# Patient Record
Sex: Female | Born: 1965
Health system: Southern US, Community
[De-identification: ages and names within clinical notes are randomized; demographics above are authoritative.]

## PROBLEM LIST (undated history)

## (undated) ENCOUNTER — Emergency Department (HOSPITAL_COMMUNITY): Admission: EM | Payer: Self-pay | Source: Home / Self Care

## (undated) ENCOUNTER — Emergency Department (HOSPITAL_COMMUNITY): Admission: EM | Discharge status: Against Medical Advice | Payer: Self-pay

## (undated) DIAGNOSIS — E785 Hyperlipidemia, unspecified: Secondary | ICD-10-CM

## (undated) DIAGNOSIS — E119 Type 2 diabetes mellitus without complications: Secondary | ICD-10-CM

## (undated) DIAGNOSIS — I5189 Other ill-defined heart diseases: Secondary | ICD-10-CM

## (undated) DIAGNOSIS — R002 Palpitations: Secondary | ICD-10-CM

## (undated) DIAGNOSIS — G8929 Other chronic pain: Secondary | ICD-10-CM

## (undated) DIAGNOSIS — R32 Unspecified urinary incontinence: Secondary | ICD-10-CM

## (undated) DIAGNOSIS — G9341 Metabolic encephalopathy: Secondary | ICD-10-CM

## (undated) DIAGNOSIS — I10 Essential (primary) hypertension: Secondary | ICD-10-CM

## (undated) DIAGNOSIS — R011 Cardiac murmur, unspecified: Secondary | ICD-10-CM

## (undated) DIAGNOSIS — E1140 Type 2 diabetes mellitus with diabetic neuropathy, unspecified: Secondary | ICD-10-CM

## (undated) DIAGNOSIS — C73 Malignant neoplasm of thyroid gland: Secondary | ICD-10-CM

## (undated) DIAGNOSIS — R079 Chest pain, unspecified: Secondary | ICD-10-CM

## (undated) DIAGNOSIS — J4489 Other specified chronic obstructive pulmonary disease: Secondary | ICD-10-CM

## (undated) DIAGNOSIS — M1990 Unspecified osteoarthritis, unspecified site: Secondary | ICD-10-CM

## (undated) DIAGNOSIS — J449 Chronic obstructive pulmonary disease, unspecified: Secondary | ICD-10-CM

## (undated) DIAGNOSIS — K219 Gastro-esophageal reflux disease without esophagitis: Secondary | ICD-10-CM

## (undated) DIAGNOSIS — J45909 Unspecified asthma, uncomplicated: Secondary | ICD-10-CM

## (undated) DIAGNOSIS — D649 Anemia, unspecified: Secondary | ICD-10-CM

## (undated) DIAGNOSIS — F32A Depression, unspecified: Secondary | ICD-10-CM

## (undated) DIAGNOSIS — R0600 Dyspnea, unspecified: Secondary | ICD-10-CM

## (undated) DIAGNOSIS — K5792 Diverticulitis of intestine, part unspecified, without perforation or abscess without bleeding: Secondary | ICD-10-CM

## (undated) DIAGNOSIS — K635 Polyp of colon: Secondary | ICD-10-CM

## (undated) DIAGNOSIS — D1722 Benign lipomatous neoplasm of skin and subcutaneous tissue of left arm: Secondary | ICD-10-CM

## (undated) DIAGNOSIS — F329 Major depressive disorder, single episode, unspecified: Secondary | ICD-10-CM

## (undated) HISTORY — DX: Diverticulitis of intestine, part unspecified, without perforation or abscess without bleeding: K57.92

## (undated) HISTORY — PX: ABDOMINAL HYSTERECTOMY: SHX81

## (undated) HISTORY — DX: Other ill-defined heart diseases: I51.89

## (undated) HISTORY — DX: Chest pain, unspecified: R07.9

## (undated) HISTORY — PX: TUBAL LIGATION: SHX77

## (undated) HISTORY — DX: Other chronic pain: G89.29

## (undated) HISTORY — DX: Palpitations: R00.2

## (undated) HISTORY — DX: Other specified chronic obstructive pulmonary disease: J44.89

## (undated) HISTORY — DX: Major depressive disorder, single episode, unspecified: F32.9

## (undated) HISTORY — DX: Depression, unspecified: F32.A

## (undated) HISTORY — DX: Chronic obstructive pulmonary disease, unspecified: J44.9

---

## 2004-11-07 ENCOUNTER — Emergency Department (HOSPITAL_COMMUNITY): Admission: EM | Admit: 2004-11-07 | Discharge: 2004-11-07 | Payer: Self-pay

## 2004-11-11 ENCOUNTER — Emergency Department (HOSPITAL_COMMUNITY): Admission: EM | Admit: 2004-11-11 | Discharge: 2004-11-11 | Payer: Self-pay | Admitting: Family Medicine

## 2004-11-13 ENCOUNTER — Emergency Department (HOSPITAL_COMMUNITY): Admission: EM | Admit: 2004-11-13 | Discharge: 2004-11-13 | Payer: Self-pay | Admitting: Family Medicine

## 2006-09-18 ENCOUNTER — Emergency Department (HOSPITAL_COMMUNITY): Admission: EM | Admit: 2006-09-18 | Discharge: 2006-09-18 | Payer: Self-pay | Admitting: Emergency Medicine

## 2010-07-11 ENCOUNTER — Emergency Department (HOSPITAL_COMMUNITY): Admission: EM | Admit: 2010-07-11 | Discharge: 2010-07-11 | Payer: Self-pay | Admitting: Emergency Medicine

## 2010-09-25 ENCOUNTER — Emergency Department (HOSPITAL_COMMUNITY): Admission: EM | Admit: 2010-09-25 | Discharge: 2010-09-25 | Payer: Self-pay | Admitting: Emergency Medicine

## 2010-11-06 ENCOUNTER — Emergency Department (HOSPITAL_COMMUNITY): Admission: EM | Admit: 2010-11-06 | Discharge: 2010-11-06 | Payer: Self-pay | Admitting: Emergency Medicine

## 2011-03-06 LAB — URINALYSIS, ROUTINE W REFLEX MICROSCOPIC
Bilirubin Urine: NEGATIVE
Glucose, UA: NEGATIVE mg/dL
Hgb urine dipstick: NEGATIVE
Ketones, ur: NEGATIVE mg/dL
Nitrite: NEGATIVE
Protein, ur: NEGATIVE mg/dL
Specific Gravity, Urine: 1.028 (ref 1.005–1.030)
Urobilinogen, UA: 1 mg/dL (ref 0.0–1.0)
pH: 6.5 (ref 5.0–8.0)

## 2011-07-29 ENCOUNTER — Emergency Department (HOSPITAL_COMMUNITY): Payer: Self-pay

## 2011-07-29 ENCOUNTER — Emergency Department (HOSPITAL_COMMUNITY)
Admission: EM | Admit: 2011-07-29 | Discharge: 2011-07-30 | Disposition: A | Discharge status: Home / Self Care | Payer: Self-pay | Attending: Emergency Medicine | Admitting: Emergency Medicine

## 2011-07-29 DIAGNOSIS — M25579 Pain in unspecified ankle and joints of unspecified foot: Secondary | ICD-10-CM | POA: Insufficient documentation

## 2011-07-29 DIAGNOSIS — W010XXA Fall on same level from slipping, tripping and stumbling without subsequent striking against object, initial encounter: Secondary | ICD-10-CM | POA: Insufficient documentation

## 2011-07-29 DIAGNOSIS — S93409A Sprain of unspecified ligament of unspecified ankle, initial encounter: Secondary | ICD-10-CM | POA: Insufficient documentation

## 2011-07-29 DIAGNOSIS — I10 Essential (primary) hypertension: Secondary | ICD-10-CM | POA: Insufficient documentation

## 2011-07-29 DIAGNOSIS — J45909 Unspecified asthma, uncomplicated: Secondary | ICD-10-CM | POA: Insufficient documentation

## 2011-11-26 ENCOUNTER — Encounter: Payer: Self-pay | Admitting: Emergency Medicine

## 2011-11-26 ENCOUNTER — Emergency Department (HOSPITAL_COMMUNITY)
Admission: EM | Admit: 2011-11-26 | Discharge: 2011-11-26 | Disposition: A | Discharge status: Home / Self Care | Payer: Self-pay | Attending: Emergency Medicine | Admitting: Emergency Medicine

## 2011-11-26 ENCOUNTER — Emergency Department (HOSPITAL_COMMUNITY): Payer: Self-pay

## 2011-11-26 DIAGNOSIS — H40009 Preglaucoma, unspecified, unspecified eye: Secondary | ICD-10-CM | POA: Insufficient documentation

## 2011-11-26 DIAGNOSIS — H40059 Ocular hypertension, unspecified eye: Secondary | ICD-10-CM

## 2011-11-26 DIAGNOSIS — F411 Generalized anxiety disorder: Secondary | ICD-10-CM | POA: Insufficient documentation

## 2011-11-26 DIAGNOSIS — H11419 Vascular abnormalities of conjunctiva, unspecified eye: Secondary | ICD-10-CM | POA: Insufficient documentation

## 2011-11-26 DIAGNOSIS — H538 Other visual disturbances: Secondary | ICD-10-CM | POA: Insufficient documentation

## 2011-11-26 DIAGNOSIS — R51 Headache: Secondary | ICD-10-CM | POA: Insufficient documentation

## 2011-11-26 DIAGNOSIS — H53149 Visual discomfort, unspecified: Secondary | ICD-10-CM | POA: Insufficient documentation

## 2011-11-26 DIAGNOSIS — H5789 Other specified disorders of eye and adnexa: Secondary | ICD-10-CM | POA: Insufficient documentation

## 2011-11-26 DIAGNOSIS — R42 Dizziness and giddiness: Secondary | ICD-10-CM | POA: Insufficient documentation

## 2011-11-26 LAB — URINALYSIS, ROUTINE W REFLEX MICROSCOPIC
Glucose, UA: NEGATIVE mg/dL
Hgb urine dipstick: NEGATIVE
Leukocytes, UA: NEGATIVE
Nitrite: NEGATIVE
Protein, ur: NEGATIVE mg/dL
Specific Gravity, Urine: 1.009 (ref 1.005–1.030)
Urobilinogen, UA: 0.2 mg/dL (ref 0.0–1.0)
pH: 6.5 (ref 5.0–8.0)

## 2011-11-26 LAB — RAPID URINE DRUG SCREEN, HOSP PERFORMED
Amphetamines: NOT DETECTED
Barbiturates: NOT DETECTED
Benzodiazepines: NOT DETECTED
Cocaine: NOT DETECTED
Opiates: NOT DETECTED
Tetrahydrocannabinol: POSITIVE — AB

## 2011-11-26 LAB — POCT PREGNANCY, URINE: Preg Test, Ur: NEGATIVE

## 2011-11-26 MED ORDER — ONDANSETRON HCL 4 MG/2ML IJ SOLN
INTRAMUSCULAR | Status: AC
  Administered 2011-11-26: 4 mg
  Filled 2011-11-26: qty 2

## 2011-11-26 MED ORDER — TIMOLOL MALEATE 0.5 % OP SOLN: 1.0000 [drp] | Freq: Two times a day (BID) | OPHTHALMIC | Status: AC

## 2011-11-26 MED ORDER — ACETAZOLAMIDE ER 500 MG PO CP12
500.0000 mg | ORAL_CAPSULE | Freq: Two times a day (BID) | ORAL | Status: DC
  Administered 2011-11-26: 500 mg via ORAL
  Filled 2011-11-26: qty 1

## 2011-11-26 MED ORDER — MORPHINE SULFATE 4 MG/ML IJ SOLN
4.0000 mg | Freq: Once | INTRAMUSCULAR | Status: AC
  Administered 2011-11-26: 4 mg via INTRAVENOUS
  Filled 2011-11-26: qty 1

## 2011-11-26 MED ORDER — TETRACAINE HCL 0.5 % OP SOLN
2.0000 [drp] | Freq: Once | OPHTHALMIC | Status: AC
  Administered 2011-11-26: 2 [drp] via OPHTHALMIC
  Filled 2011-11-26: qty 2

## 2011-11-26 NOTE — ED Notes (Signed)
Pt states, "the pain is easing away now".  Pt denies nausea. Pt. Endorses blurry vision in her right eye only.  Stimuli minimized. Pt provided with cloth for face and to cover eyes. No needs expressed when assistance offered at this time.

## 2011-11-26 NOTE — ED Provider Notes (Signed)
History     CSN: [id number] Arrival date & time: 11/26/2011 12:00 PM   First MD Initiated Contact with Patient 11/26/11 1230      Chief Complaint  Patient presents with  . Headache    (Consider location/radiation/quality/duration/timing/severity/associated sxs/prior treatment) Patient is a 45 y.o. female presenting with headaches. The history is provided by the patient.  Headache  This is a new problem. The current episode started yesterday. The problem occurs constantly. The problem has been gradually worsening. The headache is associated with bright light, activity and loud noise. The pain is located in the bilateral region. The quality of the pain is described as sharp. The pain is at a severity of 9/10. The pain is moderate. The pain does not radiate. Pertinent negatives include no fever, no chest pressure, no near-syncope, no palpitations, no shortness of breath, no nausea and no vomiting. She has tried nothing for the symptoms.  PT states she developed headache yesterday, today started having increaed pain, blurred vision, headache. States symptoms worsening. Denied drugs or alcohol, denies head trauma. No history of the same.   History reviewed. No pertinent past medical history.  History reviewed. No pertinent past surgical history.  No family history on file.  History  Substance Use Topics  . Smoking status: Never Smoker   . Smokeless tobacco: Not on file  . Alcohol Use: No    OB History    Grav Para Term Preterm Abortions TAB SAB Ect Mult Living                  Review of Systems  Constitutional: Negative.  Negative for fever.  HENT: Negative for congestion, facial swelling, neck pain and neck stiffness.   Eyes: Positive for photophobia, pain, redness and visual disturbance.  Respiratory: Negative for cough and shortness of breath.   Cardiovascular: Negative.  Negative for palpitations and near-syncope.  Gastrointestinal: Negative.  Negative for nausea and  vomiting.  Genitourinary: Negative.   Musculoskeletal: Negative.   Skin: Negative.   Neurological: Positive for dizziness and headaches. Negative for syncope and speech difficulty.  Psychiatric/Behavioral: Negative.     Allergies  Review of patient's allergies indicates no known allergies.  Home Medications  No current outpatient prescriptions on file.  BP 134/86  Pulse 76  Temp(Src) 97.7 F (36.5 C) (Oral)  Resp 16  SpO2 97%  Physical Exam  Constitutional: She is oriented to person, place, and time. She appears well-developed and well-nourished. She appears distressed.       Pt crying and rolling around in bed  HENT:  Head: Normocephalic and atraumatic.  Eyes: EOM are normal. Pupils are equal, round, and reactive to light.       conjuctiva injected, tearful  Neck: Neck supple.  Cardiovascular: Normal rate, regular rhythm and normal heart sounds.   Pulmonary/Chest: Effort normal and breath sounds normal. No respiratory distress.  Abdominal: Soft. Bowel sounds are normal. There is no tenderness.  Musculoskeletal: Normal range of motion. She exhibits no edema.  Lymphadenopathy:    She has no cervical adenopathy.  Neurological: She is alert and oriented to person, place, and time.  Skin: Skin is warm and dry.  Psychiatric:       Pt anxious    ED Course  Procedures (including critical care time)  Visual acuity 20/50 right, 20/200left, 20/40 bilat. PRessures measured, 30 in each eye. Pt's headache improved with 4mg  of morphine. CT head  Spoke with Dr. Wayland Denis, advised to give a dose of diamox, start on  timolol drops. He will see her in the office at 8am Results for orders placed during the hospital encounter of 11/26/11  URINALYSIS, ROUTINE W REFLEX MICROSCOPIC      Component Value Range   Color, Urine YELLOW  YELLOW    APPearance CLEAR  CLEAR    Specific Gravity, Urine 1.009  1.005 - 1.030    pH 6.5  5.0 - 8.0    Glucose, UA NEGATIVE  NEGATIVE (mg/dL)   Hgb  urine dipstick NEGATIVE  NEGATIVE    Bilirubin Urine NEGATIVE  NEGATIVE    Ketones, ur NEGATIVE  NEGATIVE (mg/dL)   Protein, ur NEGATIVE  NEGATIVE (mg/dL)   Urobilinogen, UA 0.2  0.0 - 1.0 (mg/dL)   Nitrite NEGATIVE  NEGATIVE    Leukocytes, UA NEGATIVE  NEGATIVE   URINE RAPID DRUG SCREEN (HOSP PERFORMED)      Component Value Range   Opiates NONE DETECTED  NONE DETECTED    Cocaine NONE DETECTED  NONE DETECTED    Benzodiazepines NONE DETECTED  NONE DETECTED    Amphetamines NONE DETECTED  NONE DETECTED    Tetrahydrocannabinol POSITIVE (*) NONE DETECTED    Barbiturates NONE DETECTED  NONE DETECTED   POCT PREGNANCY, URINE      Component Value Range   Preg Test, Ur NEGATIVE     Ct Head Wo Contrast  11/26/2011  *RADIOLOGY REPORT*  Clinical Data: Headache.  The patient became dizzy and fell, striking left side of head.  She now has blurred vision and headaches.  CT HEAD WITHOUT CONTRAST  Technique:  Contiguous axial images were obtained from the base of the skull through the vertex without contrast.  Comparison: None.  Findings: Multiple small calcifications are noted along the falx. This may be related to a remote infection.  No acute infarct, hemorrhage, or mass lesion is present.  The ventricles are of normal size.  No significant extra-axial fluid collection is present.  The paranasal sinuses and mastoid air cells are clear.  The osseous skull is intact.  IMPRESSION:  1.  Negative CT of the head.  Original Report Authenticated By: Jamesetta Orleans. MATTERN, M.D.     MDM          Lottie Mussel, [place] 11/26/11 1620

## 2011-11-26 NOTE — ED Notes (Addendum)
This RN assisted pt to bathroom for urine specimen.  Pt complaining of visual changes and dizziness.  Pt able to ambulate independently.  Pt endorses photosensitivity and nausea.  Pt. Treated for both pain and nausea.

## 2011-11-26 NOTE — ED Provider Notes (Signed)
Medical screening examination/treatment/procedure(s) were performed by non-physician practitioner and as supervising physician I was immediately available for consultation/collaboration.  Flint Melter, MD 11/26/11 [phone]

## 2011-11-26 NOTE — ED Notes (Signed)
Onset one day ago headache resolved and today approx 30 minutes ago cooking and developed Headache, dizzy, and vision loss.  Cuurently have blurry vision and headache 10/10 throbbing. Daughter at bedside.

## 2013-04-04 ENCOUNTER — Emergency Department (HOSPITAL_COMMUNITY): Payer: Self-pay

## 2013-04-04 ENCOUNTER — Emergency Department (HOSPITAL_COMMUNITY)
Admission: EM | Admit: 2013-04-04 | Discharge: 2013-04-05 | Disposition: A | Discharge status: Home / Self Care | Payer: Self-pay | Attending: Emergency Medicine | Admitting: Emergency Medicine

## 2013-04-04 ENCOUNTER — Encounter (HOSPITAL_COMMUNITY): Payer: Self-pay | Admitting: Emergency Medicine

## 2013-04-04 DIAGNOSIS — J45909 Unspecified asthma, uncomplicated: Secondary | ICD-10-CM | POA: Insufficient documentation

## 2013-04-04 DIAGNOSIS — E278 Other specified disorders of adrenal gland: Secondary | ICD-10-CM | POA: Insufficient documentation

## 2013-04-04 DIAGNOSIS — R112 Nausea with vomiting, unspecified: Secondary | ICD-10-CM | POA: Insufficient documentation

## 2013-04-04 DIAGNOSIS — I10 Essential (primary) hypertension: Secondary | ICD-10-CM | POA: Insufficient documentation

## 2013-04-04 DIAGNOSIS — R55 Syncope and collapse: Secondary | ICD-10-CM | POA: Insufficient documentation

## 2013-04-04 HISTORY — DX: Unspecified asthma, uncomplicated: J45.909

## 2013-04-04 HISTORY — DX: Essential (primary) hypertension: I10

## 2013-04-04 LAB — CBC WITH DIFFERENTIAL/PLATELET
Basophils Absolute: 0 10*3/uL (ref 0.0–0.1)
Basophils Relative: 0 % (ref 0–1)
Eosinophils Absolute: 0 10*3/uL (ref 0.0–0.7)
Eosinophils Relative: 1 % (ref 0–5)
HCT: 40.1 % (ref 36.0–46.0)
Hemoglobin: 13.1 g/dL (ref 12.0–15.0)
Lymphocytes Relative: 36 % (ref 12–46)
Lymphs Abs: 2.3 10*3/uL (ref 0.7–4.0)
MCH: 29.5 pg (ref 26.0–34.0)
MCHC: 32.7 g/dL (ref 30.0–36.0)
MCV: 90.3 fL (ref 78.0–100.0)
Monocytes Absolute: 0.4 10*3/uL (ref 0.1–1.0)
Monocytes Relative: 6 % (ref 3–12)
Neutro Abs: 3.8 10*3/uL (ref 1.7–7.7)
Neutrophils Relative %: 58 % (ref 43–77)
Platelets: 269 10*3/uL (ref 150–400)
RBC: 4.44 MIL/uL (ref 3.87–5.11)
RDW: 14.9 % (ref 11.5–15.5)
WBC: 6.5 10*3/uL (ref 4.0–10.5)

## 2013-04-04 LAB — COMPREHENSIVE METABOLIC PANEL
ALT: 21 U/L (ref 0–35)
AST: 21 U/L (ref 0–37)
Albumin: 3.6 g/dL (ref 3.5–5.2)
Alkaline Phosphatase: 90 U/L (ref 39–117)
BUN: 16 mg/dL (ref 6–23)
CO2: 29 mEq/L (ref 19–32)
Calcium: 9.6 mg/dL (ref 8.4–10.5)
GFR calc Af Amer: >90 mL/min (ref >90)
GFR calc non Af Amer: 83 mL/min — ABNORMAL LOW (ref >90)
Glucose, Bld: 121 mg/dL — ABNORMAL HIGH (ref 70–99)
Potassium: 3.1 mEq/L — ABNORMAL LOW (ref 3.5–5.1)
Sodium: 144 mEq/L (ref 135–145)
Total Protein: 7 g/dL (ref 6.0–8.3)

## 2013-04-04 LAB — D-DIMER, QUANTITATIVE: D-Dimer, Quant: 9.97 ug/mL-FEU — ABNORMAL HIGH (ref 0.00–0.48)

## 2013-04-04 LAB — TROPONIN I: Troponin I: <0.3 ng/mL (ref <0.30)

## 2013-04-04 MED ORDER — POTASSIUM CHLORIDE CRYS ER 20 MEQ PO TBCR
40.0000 meq | EXTENDED_RELEASE_TABLET | Freq: Once | ORAL | Status: AC
  Administered 2013-04-04: 40 meq via ORAL
  Filled 2013-04-04: qty 2

## 2013-04-04 MED ORDER — IOHEXOL 350 MG/ML SOLN
100.0000 mL | Freq: Once | INTRAVENOUS | Status: AC | PRN
  Administered 2013-04-04: 100 mL via INTRAVENOUS

## 2013-04-04 MED ORDER — TRAMADOL HCL 50 MG PO TABS: 50.0000 mg | ORAL_TABLET | Freq: Four times a day (QID) | ORAL | Status: DC | PRN

## 2013-04-04 MED ORDER — MORPHINE SULFATE 4 MG/ML IJ SOLN
4.0000 mg | Freq: Once | INTRAMUSCULAR | Status: AC
  Administered 2013-04-04: 4 mg via INTRAVENOUS
  Filled 2013-04-04: qty 1

## 2013-04-04 MED ORDER — GI COCKTAIL ~~LOC~~
30.0000 mL | Freq: Once | ORAL | Status: AC
  Administered 2013-04-04: 30 mL via ORAL
  Filled 2013-04-04: qty 30

## 2013-04-04 MED ORDER — SODIUM CHLORIDE 0.9 % IV BOLUS (SEPSIS)
1000.0000 mL | Freq: Once | INTRAVENOUS | Status: AC
  Administered 2013-04-04: 1000 mL via INTRAVENOUS

## 2013-04-04 MED ORDER — ONDANSETRON HCL 4 MG/2ML IJ SOLN
4.0000 mg | Freq: Once | INTRAMUSCULAR | Status: AC
  Administered 2013-04-04: 4 mg via INTRAVENOUS
  Filled 2013-04-04: qty 2

## 2013-04-04 NOTE — ED Notes (Signed)
Patient reports chest pain that worsens on inspiration.  Patient states, "It hurts to swallow."  Patient also reports back pain and dizziness.

## 2013-04-04 NOTE — ED Provider Notes (Signed)
[name] S 8:00 PM patient discussed in sign out with Fayrene Helper PA-C.  Patient with sharp pleuritic chest pain this morning. D-dimer pending. Pain improved after dose of morphine a GI cocktail.  D-dimer slightly elevated at 9.97. Will obtain CT angiogram rule out PE.  CT angioma without signs of PE. 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 Will consult internal medicine for possible admission given patient has no PCP followup.  Spoke with Dr. Ellin Goodie on call for tried hospitalist. We discussed patient's lab tests, mild hypokalemia, elevated d-dimer and CT scan findings as well as vital signs throughout emergency room stay. At this time she is not feel left kidney and adrenal gland mass requires inpatient workup. She requests that patient call with [facility] at [phone] nursing tomorrow or come as a walk-in patient and they will see for further evaluation and continued workup of the mass including possible MRI. She does request having a urine drug screen.  Plan was discussed with the patient and she agrees and will followup tomorrow.   Angus Seller, PA-C 04/04/13 [phone]

## 2013-04-04 NOTE — ED Provider Notes (Signed)
History     CSN: [id number]  Arrival date & time 04/04/13  1613   First MD Initiated Contact with Patient 04/04/13 1741      Chief Complaint  Patient presents with  . Chest Pain    (Consider location/radiation/quality/duration/timing/severity/associated sxs/prior treatment) HPI  47 year old female with history of hypertension and asthma presents complaint of chest pain. Patient reports having gradual onset of midsternal chest pain since this morning. Pain initially was mild but throughout the course the day gets progressively worse. Pain described as sharp and burning. She reports movement makes pain worse, it hurts to swallow, and she also endorsed pleuritic chest pain. She felt that her pain is worse when she takes a deep breath. Pain does radiates to the back. She reports having one witnessed syncopal episode lasting for 10-20 seconds while at work today. 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  She decided to drive to ER but while driving she felt that her vision was fading.  This lasting for a few minutes but improved. Patient denies fever, chills, dyspnea on exertion, abdominal pain, dysuria, or rash. She denies any new numbness or weakness. She does report feeling nausea it has vomit twice and but no loose stool. Patient is not a smoker. No family history of premature cardiac death. Does have family history of hypertension diabetes.  Past Medical History  Diagnosis Date  . Asthma   . Hypertension     History reviewed. No pertinent past surgical history.  History reviewed. No pertinent family history.  History  Substance Use Topics  . Smoking status: Never Smoker   . Smokeless tobacco: Not on file  . Alcohol Use: No    OB History   Grav Para Term Preterm Abortions TAB SAB Ect Mult Living                  Review of Systems  Constitutional:       10 Systems reviewed and all are negative for acute change except  as noted in the HPI.     Allergies  Review of patient's allergies indicates no known allergies.  Home Medications   Current Outpatient Rx  Name  Route  Sig  Dispense  Refill  . acetaminophen (TYLENOL) 500 MG tablet   Oral   Take 1,000 mg by mouth every 6 (six) hours as needed for pain.           BP 155/89  Pulse 57  Temp(Src) 98 F (36.7 C) (Oral)  Resp 16  SpO2 100%  Physical Exam  Nursing note and vitals reviewed. Constitutional: She is oriented to person, place, and time. She appears well-developed and well-nourished. No distress.  Awake, alert, nontoxic appearance  HENT:  Head: Atraumatic.  Right Ear: External ear normal.  Left Ear: External ear normal.  Mouth/Throat: Oropharynx is clear and moist.  Eyes: Conjunctivae, EOM and lids are normal. Pupils are equal, round, and reactive to light. No foreign bodies found. Right eye exhibits no discharge. No foreign body present in the right eye. Left eye exhibits no discharge. No foreign body present in the left eye. Right conjunctiva is not injected. Left conjunctiva is not injected. No scleral icterus.  20/50 vision to both eyes.  EOMI, PERRL, no afferent defect.    Neck: Neck supple.  Cardiovascular: Normal rate, regular rhythm and intact distal pulses.   Pulmonary/Chest: Effort normal. No respiratory distress. She exhibits no tenderness.  Abdominal: Soft.  There is no tenderness. There is no rebound.  Musculoskeletal: She exhibits no edema and no tenderness.  ROM appears intact, no obvious focal weakness  Neurological: She is alert and oriented to person, place, and time. She has normal strength. No cranial nerve deficit or sensory deficit. She displays a negative Romberg sign. Coordination and gait normal. GCS eye subscore is 4. GCS verbal subscore is 5. GCS motor subscore is 6.  Mental status and motor strength appears intact  5 out of 5 strength in all 4 extremities.  Skin: No rash noted.  Psychiatric: She has a  normal mood and affect.    ED Course  Procedures (including critical care time)   Date: 04/04/2013  Rate: 60  Rhythm: normal sinus rhythm with possible premature atrial complex with aberrant conduction  QRS Axis: normal  Intervals: normal  ST/T Wave abnormalities: normal  Conduction Disutrbances:none  Narrative Interpretation:   Old EKG Reviewed: unchanged   6:33 PM Patient with multiple complaints including chest discomfort, pleuritic chest pain, syncopal episode, spot in vision.  Pt felt much better after receiving GI cocktail and morphine.  Currently symptoms free.  She has no focal neuro deficits.  20/50 visions to both eyes.  She is afebrile with stable normal VS.  She's in no acute resp distress.  Pain is not likely to be cardiac related considering normal ECG, normal troponin even though pt has pain for several days, normal WBC.  Has K+ of 3.1, supplementation given.    I believe pt will benefit from outpt care from a PCP as she has no obvious emergent issue today.  She's able to ambulate without difficulty.  Normal orthostatic VS.  She has received IVF.    7:42 PM Care discussed with attending, who recommend head CT and d-dimer.  Pt currently comfortable and symptom free.    8:26 PM Report given to oncoming PA who will continue current care.  If Head CT and d-dimer unremarkable, pt should receive resources for outpt f/u for further care.    Labs Reviewed  COMPREHENSIVE METABOLIC PANEL - Abnormal; Notable for the following:    Potassium 3.1 (*)    Glucose, Bld 121 (*)    Total Bilirubin 0.2 (*)    GFR calc non Af Amer 83 (*)    All other components within normal limits  CBC WITH DIFFERENTIAL  TROPONIN I   Dg Chest 2 View  04/04/2013  *RADIOLOGY REPORT*  Clinical Data: Chest pain and shortness of breath.  CHEST - 2 VIEW  Comparison: 07/11/2010.  Findings: The cardiac silhouette, mediastinal and hilar contours are normal and stable.  The lungs are clear.  No pleural  effusion. The bony thorax is intact.  IMPRESSION: No acute cardiopulmonary findings.   Original Report Authenticated By: Rudie Meyer, M.D.      No diagnosis found.    MDM          Fayrene Helper, PA-C 04/04/13 2027

## 2013-04-04 NOTE — ED Notes (Signed)
Pt was able to ambulate with writer, but when pt ambulated pt closed her eye and began to swerve back and forth. Writer advised pt to open her eyes. Pt was able to ambulate with one assist.

## 2013-04-05 ENCOUNTER — Emergency Department (INDEPENDENT_AMBULATORY_CARE_PROVIDER_SITE_OTHER)
Admission: EM | Admit: 2013-04-05 | Discharge: 2013-04-05 | Disposition: A | Discharge status: Home / Self Care | Payer: Self-pay | Source: Home / Self Care

## 2013-04-05 ENCOUNTER — Other Ambulatory Visit (HOSPITAL_COMMUNITY): Payer: Self-pay | Admitting: Internal Medicine

## 2013-04-05 ENCOUNTER — Encounter (HOSPITAL_COMMUNITY): Payer: Self-pay

## 2013-04-05 ENCOUNTER — Ambulatory Visit (HOSPITAL_COMMUNITY)
Admission: RE | Admit: 2013-04-05 | Discharge: 2013-04-05 | Disposition: A | Discharge status: Home / Self Care | Payer: Self-pay | Source: Ambulatory Visit | Attending: Internal Medicine | Admitting: Internal Medicine

## 2013-04-05 DIAGNOSIS — E279 Disorder of adrenal gland, unspecified: Secondary | ICD-10-CM

## 2013-04-05 DIAGNOSIS — K5730 Diverticulosis of large intestine without perforation or abscess without bleeding: Secondary | ICD-10-CM | POA: Insufficient documentation

## 2013-04-05 DIAGNOSIS — E278 Other specified disorders of adrenal gland: Secondary | ICD-10-CM

## 2013-04-05 DIAGNOSIS — R1032 Left lower quadrant pain: Secondary | ICD-10-CM | POA: Insufficient documentation

## 2013-04-05 DIAGNOSIS — K7689 Other specified diseases of liver: Secondary | ICD-10-CM | POA: Insufficient documentation

## 2013-04-05 LAB — CREATININE, SERUM
Creatinine, Ser: 0.73 mg/dL (ref 0.50–1.10)
GFR calc Af Amer: >90 mL/min (ref >90)
GFR calc non Af Amer: >90 mL/min (ref >90)

## 2013-04-05 LAB — BUN: BUN: 12 mg/dL (ref 6–23)

## 2013-04-05 LAB — RAPID URINE DRUG SCREEN, HOSP PERFORMED
Barbiturates: NOT DETECTED
Benzodiazepines: NOT DETECTED

## 2013-04-05 MED ORDER — OXYCODONE-ACETAMINOPHEN 10-325 MG PO TABS: 1.0000 | ORAL_TABLET | ORAL | Status: DC | PRN

## 2013-04-05 MED ORDER — IOHEXOL 300 MG/ML  SOLN
100.0000 mL | Freq: Once | INTRAMUSCULAR | Status: AC | PRN
  Administered 2013-04-05: 100 mL via INTRAVENOUS

## 2013-04-05 NOTE — ED Provider Notes (Signed)
History     CSN: [id number]  Arrival date & time 04/05/13  1024   First MD Initiated Contact with Patient 04/05/13 1054      No chief complaint on file.   (Consider location/radiation/quality/duration/timing/severity/associated sxs/prior treatment) HPI Patient is a 47 year old female who comes to clinic for followup after her emergency room visit last night. She has been seen in emergency department for chest discomfort. She tells me that angina was ruled out as her blood tests were normal. She feels well today and denies chest pain, denies abdominal or urinary concerns. She was told to come to clinic as a CAT scan of the chest showed findings worrisome for adrenal mass. Patient denies systemic symptoms of night sweats, fevers, chills no weight loss or weight gain. Patient denies recent sicknesses or hospitalizations except the one last night. Past Medical History  Diagnosis Date  . Asthma   . Hypertension     No past surgical history on file.  Family history of high blood pressure  History  Substance Use Topics  . Smoking status: Never Smoker   . Smokeless tobacco: Not on file  . Alcohol Use: No    OB History   Grav Para Term Preterm Abortions TAB SAB Ect Mult Living                  Review of Systems  Constitutional: Negative for fever, chills, diaphoresis, activity change, appetite change and fatigue.  HENT: Negative for ear pain, nosebleeds, congestion, facial swelling, rhinorrhea, neck pain, neck stiffness and ear discharge.   Eyes: Negative for pain, discharge, redness, itching and visual disturbance.  Respiratory: Negative for cough, choking, chest tightness, shortness of breath, wheezing and stridor.   Cardiovascular: Negative for chest pain, palpitations and leg swelling.  Gastrointestinal: Negative for abdominal distention.  Genitourinary: Negative for dysuria, urgency, frequency, hematuria, flank pain, decreased urine volume, difficulty urinating and  dyspareunia.  Musculoskeletal: Negative for back pain, joint swelling, arthralgias and gait problem.  Neurological: Negative for dizziness, tremors, seizures, syncope, facial asymmetry, speech difficulty, weakness, light-headedness, numbness and headaches.  Hematological: Negative for adenopathy. Does not bruise/bleed easily.  Psychiatric/Behavioral: Negative for hallucinations, behavioral problems, confusion, dysphoric mood, decreased concentration and agitation.    Allergies  Review of patient's allergies indicates no known allergies.  Home Medications   Current Outpatient Rx  Name  Route  Sig  Dispense  Refill  . acetaminophen (TYLENOL) 500 MG tablet   Oral   Take 1,000 mg by mouth every 6 (six) hours as needed for pain.         Marland Kitchen oxyCODONE-acetaminophen (PERCOCET) 10-325 MG per tablet   Oral   Take 1 tablet by mouth every 4 (four) hours as needed for pain.   30 tablet   0     BP 136/84  Pulse 63  Temp(Src) 98.2 F (36.8 C) (Oral)  Resp 18  SpO2 98%  Physical Exam  Constitutional: Appears well-developed and well-nourished. No distress.  HENT: Normocephalic. External right and left ear normal. Oropharynx is clear and moist.  Eyes: Conjunctivae and EOM are normal. PERRLA, no scleral icterus.  Neck: Normal ROM. Neck supple. No JVD. No tracheal deviation. No thyromegaly.  CVS: RRR, S1/S2 +, no murmurs, no gallops, no carotid bruit.  Pulmonary: Effort and breath sounds normal, no stridor, rhonchi, wheezes, rales.  Abdominal: Soft. BS +,  no distension, tenderness, rebound or guarding.  Musculoskeletal: Normal range of motion. No edema and no tenderness.  Lymphadenopathy: No lymphadenopathy noted, cervical, inguinal.  Neuro: Alert. Normal reflexes, muscle tone coordination. No cranial nerve deficit. Skin: Skin is warm and dry. No rash noted. Not diaphoretic. No erythema. No pallor.  Psychiatric: Normal mood and affect. Behavior, judgment, thought content normal.    ED  Course  Procedures (including critical care time)  Labs Reviewed - No data to display Dg Chest 2 View  04/04/2013  *RADIOLOGY REPORT*  Clinical Data: Chest pain and shortness of breath.  CHEST - 2 VIEW  Comparison: 07/11/2010.  Findings: The cardiac silhouette, mediastinal and hilar contours are normal and stable.  The lungs are clear.  No pleural effusion. The bony thorax is intact.  IMPRESSION: No acute cardiopulmonary findings.   Original Report Authenticated By: Rudie Meyer, M.D.    Ct Head Wo Contrast  04/04/2013  *RADIOLOGY REPORT*  Clinical Data: Chest pain.  Dizziness.  CT HEAD WITHOUT CONTRAST  Technique:  Contiguous axial images were obtained from the base of the skull through the vertex without contrast.  Comparison: 11/26/2011.  Findings: The ventricles are normal.  No extra-axial fluid collections are seen.  The brainstem and cerebellum are unremarkable.  No acute intracranial findings such as infarction or hemorrhage.  No mass lesions.  The bony calvarium is intact.  The visualized paranasal sinuses and mastoid air cells are clear.  IMPRESSION: No acute intracranial findings or mass lesion.  No change since prior examination.   Original Report Authenticated By: Rudie Meyer, M.D.    Ct Angio Chest W/cm &/or Wo Cm  04/04/2013  *RADIOLOGY REPORT*  Clinical Data: Chest pain.  CT ANGIOGRAPHY CHEST  Technique:  Multidetector CT imaging of the chest using the standard protocol during bolus administration of intravenous contrast. Multiplanar reconstructed images including MIPs were obtained and reviewed to evaluate the vascular anatomy.  Contrast: OMNIPAQUE IOHEXOL 350 MG/ML SOLN  Comparison: Chest x-ray dated 04/04/2013  Findings: There are no pulmonary emboli.  There are no infiltrates or effusions.  Heart size and vascularity are normal.  No adenopathy.  No osseous abnormality.  The visualized portion of the abdomen demonstrates a soft tissue mass with small calcifications within it  measuring 3 x 2 centimeters lying just anterior to the upper pole of the left kidney and just posterior to the left adrenal gland.  The mass is incompletely visualized on this chest CT scan.  With benefit of retrospection, a tiny bit of this mass is apparent on the lumbar MRI dated 09/25/2010.  IMPRESSION:  1.  No pulmonary emboli. 2.  No acute disease in the chest. 3.  3 x 2 cm mass adjacent to the left kidney and left adrenal gland.  There are small calcifications within it.  The possibility of a pheochromocytoma or other adrenal tumor should be considered.   Original Report Authenticated By: Francene Boyers, M.D.      1. Adrenal mass    - I have discussed findings with the radiologist and recommendation was to proceed with pre-and post contrast abdominal CT for evaluation of adrenal mass - MRI may be required but will first follow up on the findings of the CAT scan of the abdomen - This was discussed with patient and family in the room in detail and they have both verbalized understanding   MDM  Adrenal mass        Dorothea Ogle, MD 04/05/13 1143

## 2013-04-07 ENCOUNTER — Telehealth (HOSPITAL_COMMUNITY): Payer: Self-pay

## 2013-04-07 NOTE — ED Provider Notes (Signed)
Medical screening examination/treatment/procedure(s) were performed by non-physician practitioner and as supervising physician I was immediately available for consultation/collaboration.   Chandani Rogowski M Oletha Tolson, DO 04/07/13 1516 

## 2013-04-07 NOTE — ED Provider Notes (Signed)
Medical screening examination/treatment/procedure(s) were performed by non-physician practitioner and as supervising physician I was immediately available for consultation/collaboration.   Laray Anger, DO 04/07/13 [phone]

## 2013-11-18 ENCOUNTER — Encounter (HOSPITAL_COMMUNITY): Payer: Self-pay | Admitting: Emergency Medicine

## 2013-11-18 ENCOUNTER — Emergency Department (HOSPITAL_COMMUNITY)
Admission: EM | Admit: 2013-11-18 | Discharge: 2013-11-18 | Disposition: A | Discharge status: Home / Self Care | Payer: Self-pay | Attending: Emergency Medicine | Admitting: Emergency Medicine

## 2013-11-18 ENCOUNTER — Emergency Department (HOSPITAL_COMMUNITY): Payer: Self-pay

## 2013-11-18 DIAGNOSIS — S6980XA Other specified injuries of unspecified wrist, hand and finger(s), initial encounter: Secondary | ICD-10-CM | POA: Insufficient documentation

## 2013-11-18 DIAGNOSIS — S6990XA Unspecified injury of unspecified wrist, hand and finger(s), initial encounter: Secondary | ICD-10-CM | POA: Insufficient documentation

## 2013-11-18 DIAGNOSIS — W010XXA Fall on same level from slipping, tripping and stumbling without subsequent striking against object, initial encounter: Secondary | ICD-10-CM | POA: Insufficient documentation

## 2013-11-18 DIAGNOSIS — Y929 Unspecified place or not applicable: Secondary | ICD-10-CM | POA: Insufficient documentation

## 2013-11-18 DIAGNOSIS — S6000XA Contusion of unspecified finger without damage to nail, initial encounter: Secondary | ICD-10-CM | POA: Insufficient documentation

## 2013-11-18 DIAGNOSIS — J45909 Unspecified asthma, uncomplicated: Secondary | ICD-10-CM | POA: Insufficient documentation

## 2013-11-18 DIAGNOSIS — M79645 Pain in left finger(s): Secondary | ICD-10-CM

## 2013-11-18 DIAGNOSIS — I10 Essential (primary) hypertension: Secondary | ICD-10-CM | POA: Insufficient documentation

## 2013-11-18 DIAGNOSIS — Y93E1 Activity, personal bathing and showering: Secondary | ICD-10-CM | POA: Insufficient documentation

## 2013-11-18 MED ORDER — KETOROLAC TROMETHAMINE 15 MG/ML IJ SOLN: 60.0000 mg | Freq: Once | INTRAMUSCULAR | Status: DC

## 2013-11-18 MED ORDER — KETOROLAC TROMETHAMINE 60 MG/2ML IM SOLN
INTRAMUSCULAR | Status: AC
  Administered 2013-11-18: 60 mg
  Filled 2013-11-18: qty 2

## 2013-11-18 NOTE — ED Provider Notes (Signed)
Medical screening examination/treatment/procedure(s) were performed by non-physician practitioner and as supervising physician I was immediately available for consultation/collaboration.  Kalik Hoare M Halie Gass, MD 11/18/13 2003 

## 2013-11-18 NOTE — ED Notes (Signed)
Pt states she fell in the tub today and injured her L pinkie finger. Pt also has bruising to her buttocks and pain to L wrist. Pt has swelling and bruising to L pinkie finger. Pt states she took Tylenol for pain earlier, but it is not helping much. Pt ambulatory to exam room with steady gait.

## 2013-11-18 NOTE — ED Provider Notes (Signed)
CSN: [id number]     Arrival date & time 11/18/13  1614 History  This chart was scribed for non-physician practitioner Lowella Dell, PA-C, working with Juliet Rude. Rubin Payor, MD by Dorothey Baseman, ED Scribe. This patient was seen in room WTR7/WTR7 and the patient's care was started at 4:37 PM.    Chief Complaint  Patient presents with  . Finger Injury   The history is provided by the patient. No language interpreter was used.   HPI Comments: [name] is a 47 y.o. female who presents to the Emergency Department complaining of an injury to the left pinky finger with associated swelling and ecchymosis onset earlier this morning when she states that she tripped and fell in the bathtub and landed on her buttocks and braced herself with her hands. She reports an associated throbbing pain, 8/10 currently, to the left pinky finger that has been progressively worsening while she was at work and states that she now cannot bend her finger secondary to pain. She denies hitting her head and loss of consciousness. She reports taking Tylenol at home without relief. She denies any other pains secondary to the fall, headache, or visual disturbance. Patient also has a history of asthma and HTN.   Past Medical History  Diagnosis Date  . Asthma   . Hypertension    History reviewed. No pertinent past surgical history. No family history on file. History  Substance Use Topics  . Smoking status: Never Smoker   . Smokeless tobacco: Not on file  . Alcohol Use: No   OB History   Grav Para Term Preterm Abortions TAB SAB Ect Mult Living                 Review of Systems  Eyes: Negative for visual disturbance.  Respiratory: Negative for shortness of breath.   Cardiovascular: Negative for chest pain.  Musculoskeletal: Positive for arthralgias, joint swelling and myalgias.  Skin: Positive for color change.  Neurological: Negative for syncope, numbness and headaches.    Allergies  Review of patient's  allergies indicates no known allergies.  Home Medications   Current Outpatient Rx  Name  Route  Sig  Dispense  Refill  . acetaminophen (TYLENOL) 500 MG tablet   Oral   Take 1,000 mg by mouth every 6 (six) hours as needed for pain.          Triage Vitals: BP 123/73  Pulse 84  Temp(Src) 98.1 F (36.7 C) (Oral)  Resp 16  SpO2 98%  Physical Exam  Nursing note and vitals reviewed. Constitutional: She is oriented to person, place, and time. She appears well-developed and well-nourished. No distress.  HENT:  Head: Normocephalic and atraumatic.  Eyes: Conjunctivae are normal.  Neck: Normal range of motion. Neck supple.  Cardiovascular: Normal rate, regular rhythm and normal heart sounds.  Exam reveals no gallop and no friction rub.   No murmur heard. Pulmonary/Chest: Effort normal and breath sounds normal. No respiratory distress. She has no wheezes. She has no rales.  Abdominal: She exhibits no distension.  Musculoskeletal: Normal range of motion.       Arms: No tenderness to palpation to the left elbow, wrist, or hand. No pain in the left 5th metacarpal. Limited range of motion to the left pinky finger secondary to pain. Unable to flex the DIP of the left pinky finger secondary to pain. No sensory deficits noted   Neurological: She is alert and oriented to person, place, and time.  Normal gait.  Skin: Skin is warm and dry.  Significant swelling and ecchymosis to the DIP of the left pinky finger.   Psychiatric: She has a normal mood and affect. Her behavior is normal.    ED Course  Procedures (including critical care time)  DIAGNOSTIC STUDIES: Oxygen Saturation is 98% on room air, normal by my interpretation.    COORDINATION OF CARE: 4:43 PM- Will order an x-ray of the left pinky finger. Discussed treatment plan with patient at bedside and patient verbalized agreement.     Labs Review Labs Reviewed - No data to display  Imaging Review Dg Finger Little  Left  11/18/2013   CLINICAL DATA:  Fall, pain/swelling along the proximal 5th digit  EXAM: LEFT LITTLE FINGER 2+V  COMPARISON:  None.  FINDINGS: No fracture or dislocation is seen.  The joint spaces are preserved.  Mild soft tissue swelling along the 5th proximal phalanx.  IMPRESSION: No fracture or dislocation is seen.  Mild soft tissue swelling along the 5th proximal phalanx.   Electronically Signed   By: Charline Bills M.D.   On: 11/18/2013 17:06    EKG Interpretation   None       MDM   1. Finger pain, left    Patient presents with Left little finger pain following a  fall in the shower earlier today. Patient denies head trauma and LOC. Patient unable to flex PIP or DIP of LEFT little finger. Patient's pain treated in ED prior to xray. Physical exam reveals subungual hematoma of Left Little finger affecting 1/3 of nail bed. Offered patient treatment with trephination. Patient refuses treatment for subungual hematoma.   Patient re-examined following Xray. Patient able to flex DIP and PIP after pain controlled. Xrays show no acute bony abnormalities. Injured digit was slpinted and patient discharged home  in good condition.   I personally performed the services described in this documentation, which was scribed in my presence. The recorded information has been reviewed and is accurate.     Rudene Anda, PA-C 11/18/13 [phone]

## 2013-11-18 NOTE — Progress Notes (Signed)
   CARE MANAGEMENT ED NOTE 11/18/2013  Patient:  [name], [name]   Account Number:  [id number]  Date Initiated:  11/18/2013  Documentation initiated by:  Edd Arbour  Subjective/Objective Assessment:   47 yr old female without a pcp listed in EPIC     Subjective/Objective Assessment Detail:     Action/Plan:   Action/Plan Detail:   see note below   Anticipated DC Date:  11/18/2013     Status Recommendation to Physician:   Result of Recommendation:    Other ED Services  Consult Working Plan    DC Planning Services  PCP issues  Outpatient Services - Pt will follow up  Other    Choice offered to / List presented to:            Status of service:  Completed, signed off  ED Comments:   ED Comments Detail:  CM spoke with pt who confirms self pay [employer] resident with no pcp. CM discussed and provided written information for self pay pcps, importance of pcp for f/u care, www.needymeds.org, discounted pharmacies and other [place] resources such as financial assistance, DSS and  health department  Reviewed resources for [employer] self pay pcps like [facility], family medicine at [address], [facility] family practice, general medical clinics, [facility] urgent care plus others, [facility] out patient pharmacies and housing Pt voiced understanding and appreciation of resources provided

## 2014-06-07 ENCOUNTER — Emergency Department (HOSPITAL_COMMUNITY)
Admission: EM | Admit: 2014-06-07 | Discharge: 2014-06-08 | Disposition: A | Discharge status: Home / Self Care | Payer: Self-pay | Attending: Emergency Medicine | Admitting: Emergency Medicine

## 2014-06-07 DIAGNOSIS — Z791 Long term (current) use of non-steroidal anti-inflammatories (NSAID): Secondary | ICD-10-CM | POA: Insufficient documentation

## 2014-06-07 DIAGNOSIS — R079 Chest pain, unspecified: Secondary | ICD-10-CM | POA: Insufficient documentation

## 2014-06-07 DIAGNOSIS — R059 Cough, unspecified: Secondary | ICD-10-CM | POA: Insufficient documentation

## 2014-06-07 DIAGNOSIS — R05 Cough: Secondary | ICD-10-CM | POA: Insufficient documentation

## 2014-06-07 DIAGNOSIS — I10 Essential (primary) hypertension: Secondary | ICD-10-CM | POA: Insufficient documentation

## 2014-06-07 DIAGNOSIS — J45909 Unspecified asthma, uncomplicated: Secondary | ICD-10-CM | POA: Insufficient documentation

## 2014-06-08 ENCOUNTER — Emergency Department (HOSPITAL_COMMUNITY): Payer: Self-pay

## 2014-06-08 ENCOUNTER — Encounter (HOSPITAL_COMMUNITY): Payer: Self-pay | Admitting: Emergency Medicine

## 2014-06-08 LAB — CBC
HCT: 45.5 % (ref 36.0–46.0)
Hemoglobin: 14.7 g/dL (ref 12.0–15.0)
MCH: 29.8 pg (ref 26.0–34.0)
MCHC: 32.3 g/dL (ref 30.0–36.0)
MCV: 92.1 fL (ref 78.0–100.0)
PLATELETS: 275 10*3/uL (ref 150–400)
RBC: 4.94 MIL/uL (ref 3.87–5.11)
RDW: 13.9 % (ref 11.5–15.5)
WBC: 6.9 10*3/uL (ref 4.0–10.5)

## 2014-06-08 LAB — BASIC METABOLIC PANEL
BUN: 18 mg/dL (ref 6–23)
CHLORIDE: 102 meq/L (ref 96–112)
CO2: 24 meq/L (ref 19–32)
Calcium: 9.9 mg/dL (ref 8.4–10.5)
Creatinine, Ser: 0.82 mg/dL (ref 0.50–1.10)
GFR calc non Af Amer: 84 mL/min — ABNORMAL LOW (ref >90)
Glucose, Bld: 147 mg/dL — ABNORMAL HIGH (ref 70–99)
POTASSIUM: 3.2 meq/L — AB (ref 3.7–5.3)
Sodium: 143 mEq/L (ref 137–147)

## 2014-06-08 LAB — I-STAT TROPONIN, ED: TROPONIN I, POC: 0 ng/mL (ref 0.00–0.08)

## 2014-06-08 MED ORDER — KETOROLAC TROMETHAMINE 60 MG/2ML IM SOLN
60.0000 mg | Freq: Once | INTRAMUSCULAR | Status: AC
  Administered 2014-06-08: 60 mg via INTRAMUSCULAR
  Filled 2014-06-08: qty 2

## 2014-06-08 MED ORDER — OXYCODONE-ACETAMINOPHEN 5-325 MG PO TABS
1.0000 | ORAL_TABLET | Freq: Once | ORAL | Status: DC
Start: 2014-06-08 — End: 2014-06-08
  Filled 2014-06-08: qty 1

## 2014-06-08 MED ORDER — ALBUTEROL SULFATE HFA 108 (90 BASE) MCG/ACT IN AERS
2.0000 | INHALATION_SPRAY | Freq: Once | RESPIRATORY_TRACT | Status: AC
  Administered 2014-06-08: 2 via RESPIRATORY_TRACT
  Filled 2014-06-08: qty 6.7

## 2014-06-08 MED ORDER — NAPROXEN 500 MG PO TABS: 500.0000 mg | ORAL_TABLET | Freq: Two times a day (BID) | ORAL | Status: DC

## 2014-06-08 NOTE — Discharge Instructions (Signed)

## 2014-06-08 NOTE — ED Notes (Addendum)
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 +nausea, vomiting. Pt tearful.

## 2014-06-08 NOTE — ED Provider Notes (Signed)
CSN: [id number]     Arrival date & time 06/07/14  2349 History   First MD Initiated Contact with Patient 06/08/14 0058     Chief Complaint  Patient presents with  . Chest Pain  . Headache      The history is provided by the patient.   Patient reports increasing chest pain since 8:30 PM while at work.  She states she's been coughing over the past 24 hours.  His been nonproductive cough.  She denies shortness of breath.  No orthopnea.  No history of congestive heart failure.  She reports every time she takes a deep breath or cough she gets pain in her anterior chest.  No history of cardiac disease.  She does not smoke cigarettes.  She does have a history of asthma and hypertension.  She's not tried anything for the discomfort or pain.  No fevers or chills.  No unilateral leg swelling.  No history of DVT or pulmonary embolism.   Past Medical History  Diagnosis Date  . Asthma   . Hypertension    Past Surgical History  Procedure Laterality Date  . Abdominal hysterectomy    . Tubal ligation     No family history on file. History  Substance Use Topics  . Smoking status: Never Smoker   . Smokeless tobacco: Not on file  . Alcohol Use: No   OB History   Grav Para Term Preterm Abortions TAB SAB Ect Mult Living                 Review of Systems  Cardiovascular: Positive for chest pain.  Neurological: Positive for headaches.  All other systems reviewed and are negative.     Allergies  Review of patient's allergies indicates no known allergies.  Home Medications   Prior to Admission medications   Medication Sig Start Date End Date Taking? Authorizing Provider  acetaminophen (TYLENOL) 500 MG tablet Take 1,000 mg by mouth every 6 (six) hours as needed (for pain.).    Yes Historical Provider, MD  naproxen (NAPROSYN) 500 MG tablet Take 1 tablet (500 mg total) by mouth 2 (two) times daily. 06/08/14   Hoy Morn, MD   BP 122/86  Pulse 66  Temp(Src) 98.7 F (37.1 C) (Oral)   Resp 16  Ht 5\' 1"  (1.549 m)  Wt 176 lb (79.833 kg)  BMI 33.27 kg/m2  SpO2 96% Physical Exam  Nursing note and vitals reviewed. Constitutional: She is oriented to person, place, and time. She appears well-developed and well-nourished. No distress.  HENT:  Head: Normocephalic and atraumatic.  Eyes: EOM are normal.  Neck: Normal range of motion.  Cardiovascular: Normal rate, regular rhythm and normal heart sounds.   Pulmonary/Chest: Effort normal. She has wheezes.  Abdominal: Soft. She exhibits no distension. There is no tenderness.  Musculoskeletal: Normal range of motion.  Neurological: She is alert and oriented to person, place, and time.  Skin: Skin is warm and dry.  Psychiatric: She has a normal mood and affect. Judgment normal.    ED Course  Procedures (including critical care time) Labs Review Labs Reviewed  BASIC METABOLIC PANEL - Abnormal; Notable for the following:    Potassium 3.2 (*)    Glucose, Bld 147 (*)    GFR calc non Af Amer 84 (*)    All other components within normal limits  CBC  I-STAT TROPOININ, ED  POC URINE PREG, ED    Imaging Review Dg Chest [facility] 1 View  06/08/2014  CLINICAL DATA:  Chest pain and back pain. Shortness of breath and headache. Blurred vision for 5 hours. History of asthma.  EXAM: PORTABLE CHEST - 1 VIEW  COMPARISON:  Chest radiograph and CTA of the chest performed 04/04/2013  FINDINGS: The lungs are well-aerated and clear. There is no evidence of focal opacification, pleural effusion or pneumothorax.  The cardiomediastinal silhouette is within normal limits. No acute osseous abnormalities are seen.  IMPRESSION: No acute cardiopulmonary process seen.   Electronically Signed   By: Garald Balding M.D.   On: 06/08/2014 01:06  I personally reviewed the imaging tests through PACS system I reviewed available ER/hospitalization records through the EMR    EKG Interpretation   Date/Time:  Wednesday June 07 2014 23:54:46 EDT Ventricular Rate:   90 PR Interval:  133 QRS Duration: 86 QT Interval:  386 QTC Calculation: 472 R Axis:   56 Text Interpretation:  Sinus rhythm Multiple premature complexes, vent   Biatrial enlargement No significant change was found Confirmed by CAMPOS   MD, KEVIN ([id number]) on 06/08/2014 1:18:14 AM      MDM   Final diagnoses:  Chest pain  Cough    2:41 AM Patient feels much better at this time.  Lungs are clear at this time.  She reports her anterior chest pain is better.  Doubt ACS.  This is likely bronchospasm with musculoskeletal chest pain from coughing.  Does better after Toradol.  Home with the inhaler.  My suspicion is that this is not a true asthma flare is much as it was more the chest discomfort that brought her here.  I will on steroids at this time    Hoy Morn, MD 06/08/14 [phone]

## 2015-04-09 ENCOUNTER — Emergency Department (HOSPITAL_COMMUNITY)
Admission: EM | Admit: 2015-04-09 | Discharge: 2015-04-09 | Disposition: A | Discharge status: Home / Self Care | Payer: Self-pay | Attending: Emergency Medicine | Admitting: Emergency Medicine

## 2015-04-09 ENCOUNTER — Emergency Department (HOSPITAL_COMMUNITY): Payer: Self-pay

## 2015-04-09 ENCOUNTER — Encounter (HOSPITAL_COMMUNITY): Payer: Self-pay | Admitting: Emergency Medicine

## 2015-04-09 DIAGNOSIS — R197 Diarrhea, unspecified: Secondary | ICD-10-CM | POA: Insufficient documentation

## 2015-04-09 DIAGNOSIS — R200 Anesthesia of skin: Secondary | ICD-10-CM | POA: Insufficient documentation

## 2015-04-09 DIAGNOSIS — Z9851 Tubal ligation status: Secondary | ICD-10-CM | POA: Insufficient documentation

## 2015-04-09 DIAGNOSIS — R1032 Left lower quadrant pain: Secondary | ICD-10-CM | POA: Insufficient documentation

## 2015-04-09 DIAGNOSIS — I10 Essential (primary) hypertension: Secondary | ICD-10-CM | POA: Insufficient documentation

## 2015-04-09 DIAGNOSIS — J45909 Unspecified asthma, uncomplicated: Secondary | ICD-10-CM | POA: Insufficient documentation

## 2015-04-09 LAB — CBC WITH DIFFERENTIAL/PLATELET
Basophils Absolute: 0 10*3/uL (ref 0.0–0.1)
Basophils Relative: 0 % (ref 0–1)
Eosinophils Absolute: 0 10*3/uL (ref 0.0–0.7)
Eosinophils Relative: 0 % (ref 0–5)
HCT: 44.1 % (ref 36.0–46.0)
Hemoglobin: 13.9 g/dL (ref 12.0–15.0)
LYMPHS PCT: 34 % (ref 12–46)
Lymphs Abs: 2.3 10*3/uL (ref 0.7–4.0)
MCH: 29 pg (ref 26.0–34.0)
MCHC: 31.5 g/dL (ref 30.0–36.0)
MCV: 91.9 fL (ref 78.0–100.0)
MONO ABS: 0.4 10*3/uL (ref 0.1–1.0)
MONOS PCT: 6 % (ref 3–12)
NEUTROS ABS: 4.1 10*3/uL (ref 1.7–7.7)
Neutrophils Relative %: 60 % (ref 43–77)
PLATELETS: 265 10*3/uL (ref 150–400)
RBC: 4.8 MIL/uL (ref 3.87–5.11)
RDW: 14.1 % (ref 11.5–15.5)
WBC: 6.8 10*3/uL (ref 4.0–10.5)

## 2015-04-09 LAB — COMPREHENSIVE METABOLIC PANEL
ALT: 28 U/L (ref 0–35)
AST: 25 U/L (ref 0–37)
Albumin: 4.2 g/dL (ref 3.5–5.2)
Alkaline Phosphatase: 116 U/L (ref 39–117)
Anion gap: 8 (ref 5–15)
BUN: 14 mg/dL (ref 6–23)
CALCIUM: 9.5 mg/dL (ref 8.4–10.5)
CO2: 27 mmol/L (ref 19–32)
CREATININE: 0.8 mg/dL (ref 0.50–1.10)
Chloride: 107 mmol/L (ref 96–112)
GFR calc Af Amer: >90 mL/min (ref >90)
GFR calc non Af Amer: 86 mL/min — ABNORMAL LOW (ref >90)
GLUCOSE: 125 mg/dL — AB (ref 70–99)
Potassium: 3.4 mmol/L — ABNORMAL LOW (ref 3.5–5.1)
SODIUM: 142 mmol/L (ref 135–145)
Total Bilirubin: 0.4 mg/dL (ref 0.3–1.2)
Total Protein: 7.3 g/dL (ref 6.0–8.3)

## 2015-04-09 LAB — URINALYSIS, ROUTINE W REFLEX MICROSCOPIC
Bilirubin Urine: NEGATIVE
Glucose, UA: NEGATIVE mg/dL
Ketones, ur: NEGATIVE mg/dL
NITRITE: NEGATIVE
Protein, ur: NEGATIVE mg/dL
SPECIFIC GRAVITY, URINE: 1.015 (ref 1.005–1.030)
UROBILINOGEN UA: 0.2 mg/dL (ref 0.0–1.0)
pH: 6 (ref 5.0–8.0)

## 2015-04-09 LAB — URINE MICROSCOPIC-ADD ON

## 2015-04-09 MED ORDER — ONDANSETRON HCL 4 MG/2ML IJ SOLN
4.0000 mg | Freq: Once | INTRAMUSCULAR | Status: AC
  Administered 2015-04-09: 4 mg via INTRAVENOUS
  Filled 2015-04-09: qty 2

## 2015-04-09 MED ORDER — IOHEXOL 300 MG/ML  SOLN
100.0000 mL | Freq: Once | INTRAMUSCULAR | Status: AC | PRN
  Administered 2015-04-09: 100 mL via INTRAVENOUS

## 2015-04-09 MED ORDER — IOHEXOL 300 MG/ML  SOLN
50.0000 mL | Freq: Once | INTRAMUSCULAR | Status: AC | PRN
  Administered 2015-04-09: 50 mL via ORAL

## 2015-04-09 MED ORDER — ONDANSETRON 4 MG PO TBDP: 4.0000 mg | ORAL_TABLET | Freq: Three times a day (TID) | ORAL | Status: DC | PRN

## 2015-04-09 MED ORDER — MORPHINE SULFATE 4 MG/ML IJ SOLN
4.0000 mg | Freq: Once | INTRAMUSCULAR | Status: AC
  Administered 2015-04-09: 4 mg via INTRAVENOUS
  Filled 2015-04-09: qty 1

## 2015-04-09 MED ORDER — NAPROXEN 250 MG PO TABS: 250.0000 mg | ORAL_TABLET | Freq: Two times a day (BID) | ORAL | Status: DC

## 2015-04-09 MED ORDER — HYDROCODONE-ACETAMINOPHEN 5-325 MG PO TABS: 1.0000 | ORAL_TABLET | ORAL | Status: DC | PRN

## 2015-04-09 MED ORDER — SODIUM CHLORIDE 0.9 % IV BOLUS (SEPSIS): 1000.0000 mL | Freq: Once | INTRAVENOUS | Status: DC

## 2015-04-09 MED ORDER — SODIUM CHLORIDE 0.9 % IV BOLUS (SEPSIS)
500.0000 mL | Freq: Once | INTRAVENOUS | Status: AC
  Administered 2015-04-09: 17:00:00 via INTRAVENOUS

## 2015-04-09 MED ORDER — HYDROMORPHONE HCL 1 MG/ML IJ SOLN: 1.0000 mg | Freq: Once | INTRAMUSCULAR | Status: DC

## 2015-04-09 MED ORDER — KETOROLAC TROMETHAMINE 15 MG/ML IJ SOLN
15.0000 mg | Freq: Once | INTRAMUSCULAR | Status: AC
  Administered 2015-04-09: 15 mg via INTRAVENOUS
  Filled 2015-04-09: qty 1

## 2015-04-09 NOTE — ED Notes (Signed)
Patient transported to CT 

## 2015-04-09 NOTE — Discharge Instructions (Signed)
Abdominal Pain, Women °Abdominal (stomach, pelvic, or belly) pain can be caused by many things. It is important to tell your doctor: °· The location of the pain. °· Does it come and go or is it present all the time? °· Are there things that start the pain (eating certain foods, exercise)? °· Are there other symptoms associated with the pain (fever, nausea, vomiting, diarrhea)? °All of this is helpful to know when trying to find the cause of the pain. °CAUSES  °· Stomach: virus or bacteria infection, or ulcer. °· Intestine: appendicitis (inflamed appendix), regional ileitis (Crohn's disease), ulcerative colitis (inflamed colon), irritable bowel syndrome, diverticulitis (inflamed diverticulum of the colon), or cancer of the stomach or intestine. °· Gallbladder disease or stones in the gallbladder. °· Kidney disease, kidney stones, or infection. °· Pancreas infection or cancer. °· Fibromyalgia (pain disorder). °· Diseases of the female organs: °¨ Uterus: fibroid (non-cancerous) tumors or infection. °¨ Fallopian tubes: infection or tubal pregnancy. °¨ Ovary: cysts or tumors. °¨ Pelvic adhesions (scar tissue). °¨ Endometriosis (uterus lining tissue growing in the pelvis and on the pelvic organs). °¨ Pelvic congestion syndrome (female organs filling up with blood just before the menstrual period). °¨ Pain with the menstrual period. °¨ Pain with ovulation (producing an egg). °¨ Pain with an IUD (intrauterine device, birth control) in the uterus. °¨ Cancer of the female organs. °· Functional pain (pain not caused by a disease, may improve without treatment). °· Psychological pain. °· Depression. °DIAGNOSIS  °Your doctor will decide the seriousness of your pain by doing an examination. °· Blood tests. °· X-rays. °· Ultrasound. °· CT scan (computed tomography, special type of X-ray). °· MRI (magnetic resonance imaging). °· Cultures, for infection. °· Barium enema (dye inserted in the large intestine, to better view it with  X-rays). °· Colonoscopy (looking in intestine with a lighted tube). °· Laparoscopy (minor surgery, looking in abdomen with a lighted tube). °· Major abdominal exploratory surgery (looking in abdomen with a large incision). °TREATMENT  °The treatment will depend on the cause of the pain.  °· Many cases can be observed and treated at home. °· Over-the-counter medicines recommended by your caregiver. °· Prescription medicine. °· Antibiotics, for infection. °· Birth control pills, for painful periods or for ovulation pain. °· Hormone treatment, for endometriosis. °· Nerve blocking injections. °· Physical therapy. °· Antidepressants. °· Counseling with a psychologist or psychiatrist. °· Minor or major surgery. °HOME CARE INSTRUCTIONS  °· Do not take laxatives, unless directed by your caregiver. °· Take over-the-counter pain medicine only if ordered by your caregiver. Do not take aspirin because it can cause an upset stomach or bleeding. °· Try a clear liquid diet (broth or water) as ordered by your caregiver. Slowly move to a bland diet, as tolerated, if the pain is related to the stomach or intestine. °· Have a thermometer and take your temperature several times a day, and record it. °· Bed rest and sleep, if it helps the pain. °· Avoid sexual intercourse, if it causes pain. °· Avoid stressful situations. °· Keep your follow-up appointments and tests, as your caregiver orders. °· If the pain does not go away with medicine or surgery, you may try: °¨ Acupuncture. °¨ Relaxation exercises (yoga, meditation). °¨ Group therapy. °¨ Counseling. °SEEK MEDICAL CARE IF:  °· You notice certain foods cause stomach pain. °· Your home care treatment is not helping your pain. °· You need stronger pain medicine. °· You want your IUD removed. °· You feel faint or   lightheaded. °· You develop nausea and vomiting. °· You develop a rash. °· You are having side effects or an allergy to your medicine. °SEEK IMMEDIATE MEDICAL CARE IF:  °· Your  pain does not go away or gets worse. °· You have a fever. °· Your pain is felt only in portions of the abdomen. The right side could possibly be appendicitis. The left lower portion of the abdomen could be colitis or diverticulitis. °· You are passing blood in your stools (bright red or black tarry stools, with or without vomiting). °· You have blood in your urine. °· You develop chills, with or without a fever. °· You pass out. °MAKE SURE YOU:  °· Understand these instructions. °· Will watch your condition. °· Will get help right away if you are not doing well or get worse. °Document Released: 10/05/2007 Document Revised: 04/24/2014 Document Reviewed: 10/25/2009 °[employer]® Patient Information ©2015 [employer]. This information is not intended to replace advice given to you by your health care provider. Make sure you discuss any questions you have with your health care provider. ° °

## 2015-04-09 NOTE — ED Notes (Signed)
Pt c/o right groin pain that is worse when she walks, bends or moves.  Pt will radiate to left side and into pelvic area.  Pt states that pain has been going on for 3 days.  Pt denies any injury or fall that could have caused that pain.  Pt states that while she was at work today she about fell due to the pain and was told to go to doctor.

## 2015-04-09 NOTE — ED Notes (Signed)
Pt laughing and giggling with family. Pt does not want any more medication heavier at this time

## 2015-04-09 NOTE — ED Notes (Signed)
Pt ambulating independently w/ steady gait on d/c in no acute distress, A&Ox4. D/c instructions reviewed w/ pt and family - pt and family deny any further questions or concerns at present. Rx given x3  

## 2015-04-09 NOTE — ED Provider Notes (Signed)
CSN: [id number]     Arrival date & time 04/09/15  1258 History   First MD Initiated Contact with Patient 04/09/15 1459     Chief Complaint  Patient presents with  . Groin Pain   [name] is a 49 y.o. female with a history of a total hysterectomy and hypertension who presents the emergency department complaining of right inguinal pain ongoing for the past three days and becoming progressively worse. The patient is currently complaining of 7 out of 10 achy and sharp right inguinal pain that is worse with movement. She reports also having some numbness in the anterior portion of her right leg earlier today but none currently. The patient denies injury or trauma. She is also complaining of bilateral low abdominal pain and suprapubic pain ongoing for the past 3 days. She also complains of some urinary urgency but no other urinary symptoms. The patient also complains of left lower back pain that will radiate to her right groin when her pain is worse. The patient reports taking Tylenol, Excedrin and Aleve today without relief. The patient denies injury or trauma to her hip or leg. The patient denies falls. The patient denies weakness, swelling or tingling. Patient has a history of high blood pressure but is not prescribed blood pressure medicines. She reports her primary care physician has been watching her blood pressures. The patient reports having a loose stool 2 days ago but none since. The patient reports she has been drinking orange juice but has not had much of an appetite for solid food. The patient denies fevers, chills, nausea, vomiting, hematochezia, dysuria, urinary frequency, vaginal bleeding, vaginal discharge, weakness, rashes, leg pain, leg swelling or tingling.  (Consider location/radiation/quality/duration/timing/severity/associated sxs/prior Treatment) HPI  Past Medical History  Diagnosis Date  . Asthma   . Hypertension    Past Surgical History  Procedure Laterality Date  .  Abdominal hysterectomy    . Tubal ligation     No family history on file. History  Substance Use Topics  . Smoking status: Never Smoker   . Smokeless tobacco: Not on file  . Alcohol Use: No   OB History    No data available     Review of Systems  Constitutional: Negative for fever and chills.  HENT: Negative for congestion and sore throat.   Eyes: Negative for visual disturbance.  Respiratory: Negative for cough, shortness of breath and wheezing.   Cardiovascular: Negative for chest pain and leg swelling.  Gastrointestinal: Positive for abdominal pain and diarrhea. Negative for nausea, vomiting and blood in stool.  Genitourinary: Positive for urgency and pelvic pain. Negative for dysuria, frequency, hematuria, vaginal bleeding, vaginal discharge, difficulty urinating and genital sores.  Musculoskeletal: Positive for back pain. Negative for neck pain.  Skin: Negative for rash.  Neurological: Positive for numbness. Negative for weakness and headaches.      Allergies  Review of patient's allergies indicates no known allergies.  Home Medications   Prior to Admission medications   Medication Sig Start Date End Date Taking? Authorizing Provider  acetaminophen (TYLENOL) 500 MG tablet Take 1,000 mg by mouth every 6 (six) hours as needed (for pain.).    Yes Historical Provider, MD  naproxen sodium (ANAPROX) 220 MG tablet Take 880 mg by mouth once.   Yes Historical Provider, MD  HYDROcodone-acetaminophen (NORCO/VICODIN) 5-325 MG per tablet Take 1 tablet by mouth every 4 (four) hours as needed. 04/09/15   Waynetta Pean, PA-C  naproxen (NAPROSYN) 250 MG tablet Take 1 tablet (250  mg total) by mouth 2 (two) times daily with a meal. 04/09/15   Waynetta Pean, PA-C  ondansetron (ZOFRAN ODT) 4 MG disintegrating tablet Take 1 tablet (4 mg total) by mouth every 8 (eight) hours as needed for nausea or vomiting. 04/09/15   Waynetta Pean, PA-C   BP 137/93 mmHg  Pulse 61  Temp(Src) 97.9 F (36.6  C) (Oral)  Resp 16  SpO2 100% Physical Exam  Constitutional: She appears well-developed and well-nourished. No distress.  Nontoxic appearing.  HENT:  Head: Normocephalic and atraumatic.  Mouth/Throat: Oropharynx is clear and moist.  Eyes: Conjunctivae are normal. Pupils are equal, round, and reactive to light. Right eye exhibits no discharge. Left eye exhibits no discharge.  Neck: Neck supple. No JVD present.  Cardiovascular: Normal rate, regular rhythm, normal heart sounds and intact distal pulses.  Exam reveals no gallop and no friction rub.   No murmur heard. Bilateral radial, posterior tibialis and dorsalis pedis pulses are intact.   Pulmonary/Chest: Effort normal and breath sounds normal. No respiratory distress. She has no wheezes. She has no rales.  Abdominal: Soft. Bowel sounds are normal. She exhibits no distension and no mass. There is tenderness. There is guarding. There is no rebound.  Abdomen is soft. Bowel sounds are present. Patient has bilateral lower abdominal tenderness and suprapubic tenderness to palpation. No rebound tenderness. No masses noted. Right inguinal tenderness without masses. Pain worsens with manipulation of her right leg.   Musculoskeletal: She exhibits tenderness. She exhibits no edema.  Left lateral low back is mildly tender to palpation. No midline back tenderness. No edema, or deformity noted. No lower extremity edema or tenderness noted. No calf tenderness.  Lymphadenopathy:    She has no cervical adenopathy.  Neurological: She is alert. Coordination normal.  Skin: Skin is warm and dry. No rash noted. She is not diaphoretic. No erythema. No pallor.  Psychiatric: She has a normal mood and affect. Her behavior is normal.  Nursing note and vitals reviewed.   ED Course  Procedures (including critical care time) Labs Review Labs Reviewed  COMPREHENSIVE METABOLIC PANEL - Abnormal; Notable for the following:    Potassium 3.4 (*)    Glucose, Bld 125  (*)    GFR calc non Af Amer 86 (*)    All other components within normal limits  URINALYSIS, ROUTINE W REFLEX MICROSCOPIC - Abnormal; Notable for the following:    APPearance CLOUDY (*)    Hgb urine dipstick SMALL (*)    Leukocytes, UA MODERATE (*)    All other components within normal limits  URINE MICROSCOPIC-ADD ON - Abnormal; Notable for the following:    Squamous Epithelial / LPF FEW (*)    Bacteria, UA MANY (*)    All other components within normal limits  URINE CULTURE  CBC WITH DIFFERENTIAL/PLATELET    Imaging Review Ct Abdomen Pelvis W Contrast  04/09/2015   CLINICAL DATA:  Right groin pain and inguinal pain. Bilateral lower abdominal pain. Pain is worse when she walks, bends, or moves. Symptoms for 3 days. No known injury fall.  EXAM: CT ABDOMEN AND PELVIS WITH CONTRAST  TECHNIQUE: Multidetector CT imaging of the abdomen and pelvis was performed using the standard protocol following bolus administration of intravenous contrast.  CONTRAST:  181mL OMNIPAQUE IOHEXOL 300 MG/ML SOLN, 42mL OMNIPAQUE IOHEXOL 300 MG/ML SOLN  COMPARISON:  CT of the abdomen and pelvis on 04/05/2013  FINDINGS: Lower chest: The lung bases are unremarkable.  Heart size is normal.  Upper abdomen: A  left adrenal mass contains foci of calcification and measures 3.1 x 2.5 cm. Previously this measured 2.4 x 2.6 cm. Lesion show significant washout, consistent with benign adenoma.  Numerous small low-attenuation lesions are identified throughout the liver, consistent with benign lesions. No focal abnormality identified within the spleen, pancreas, or right adrenal gland. A left parapelvic cyst is present. No hydronephrosis. The gallbladder is present.  Gastrointestinal tract: Stomach and small bowel loops are normal in appearance. The appendix is well seen and has a normal appearance. There is significant colonic diverticulosis but no acute diverticulitis.  Pelvis: The uterus is absent. There is no adnexal mass. No free  pelvic fluid.  Retroperitoneum: No retroperitoneal or mesenteric adenopathy. No evidence for aortic aneurysm.  Abdominal wall: Unremarkable. No abdominal wall hernias or other suspicious findings.  Osseous structures: No suspicious lytic or blastic lesions are identified.  IMPRESSION: 1. Significant colonic diverticulosis. 2. Left adrenal adenoma. 3. Small liver lesions, likely representing cysts and without worrisome characteristics.   Electronically Signed   By: Nolon Nations M.D.   On: 04/09/2015 17:29     EKG Interpretation None      Filed Vitals:   04/09/15 1444 04/09/15 1610 04/09/15 1827 04/09/15 1925  BP: 151/100 163/101 151/93 137/93  Pulse: 74 69 56 61  Temp: 97.9 F (36.6 C)     TempSrc: Oral     Resp: 18 16 16 16   SpO2: 97% 100% 99% 100%     MDM   Meds given in ED:  Medications  ondansetron (ZOFRAN) injection 4 mg (4 mg Intravenous Given 04/09/15 1654)  morphine 4 MG/ML injection 4 mg (4 mg Intravenous Given 04/09/15 1655)  sodium chloride 0.9 % bolus 500 mL (0 mLs Intravenous Stopped 04/09/15 1940)  iohexol (OMNIPAQUE) 300 MG/ML solution 50 mL (50 mLs Oral Contrast Given 04/09/15 1600)  iohexol (OMNIPAQUE) 300 MG/ML solution 100 mL (100 mLs Intravenous Contrast Given 04/09/15 1704)  ketorolac (TORADOL) 15 MG/ML injection 15 mg (15 mg Intravenous Given 04/09/15 1833)    Discharge Medication List as of 04/09/2015  7:19 PM    START taking these medications   Details  HYDROcodone-acetaminophen (NORCO/VICODIN) 5-325 MG per tablet Take 1 tablet by mouth every 4 (four) hours as needed., Starting 04/09/2015, Until Discontinued, Print    ondansetron (ZOFRAN ODT) 4 MG disintegrating tablet Take 1 tablet (4 mg total) by mouth every 8 (eight) hours as needed for nausea or vomiting., Starting 04/09/2015, Until Discontinued, Print        Final diagnoses:  Left inguinal pain   This is a 49 y.o. female with a history of a total hysterectomy and hypertension who presents the  emergency department complaining of right inguinal pain ongoing for the past three days and becoming progressively worse. The patient is currently complaining of 7 out of 10 achy and sharp right inguinal pain that is worse with movement.  She is afebrile and nontoxic appearing. Abdominal exam the patient has bilateral lower abdominal tenderness and suprapubic tenderness on exam. She also has right inguinal tenderness without masses. CBC and CMP are unremarkable. Urinalysis is nitrite negative. Will send for urine culture. CT scan of her abdomen and pelvis obtained which showed significant colonic diverticulosis without diverticulitis. Should a left adrenal adenoma and some small liver lesions without worrisome characteristics. CT scan does not show explanation for her pain today. At revaluation the patient reports feeling much better and is ready to be discharged. Will discharge with pain and nausea medicine and have her follow  up with her PCP. Strict return precautions provided. I advised the patient to follow-up with their primary care provider this week. I advised the patient to return to the emergency department with new or worsening symptoms or new concerns. The patient verbalized understanding and agreement with plan.    This patient was discussed with and evaluated by Dr. Wilson Singer, who agrees with assessment and plan.    Waynetta Pean, PA-C 04/10/15 0127  Virgel Manifold, MD 04/10/15 [phone]

## 2015-04-11 LAB — URINE CULTURE: Colony Count: 50000

## 2015-06-11 ENCOUNTER — Ambulatory Visit: Payer: Self-pay | Admitting: Family Medicine

## 2015-09-22 ENCOUNTER — Encounter (HOSPITAL_COMMUNITY): Payer: Self-pay | Admitting: Emergency Medicine

## 2015-09-22 ENCOUNTER — Emergency Department (HOSPITAL_COMMUNITY)
Admission: EM | Admit: 2015-09-22 | Discharge: 2015-09-22 | Disposition: A | Discharge status: Home / Self Care | Payer: Worker's Compensation | Attending: Emergency Medicine | Admitting: Emergency Medicine

## 2015-09-22 ENCOUNTER — Emergency Department (HOSPITAL_COMMUNITY): Payer: Worker's Compensation

## 2015-09-22 DIAGNOSIS — Y9389 Activity, other specified: Secondary | ICD-10-CM | POA: Diagnosis not present

## 2015-09-22 DIAGNOSIS — J45901 Unspecified asthma with (acute) exacerbation: Secondary | ICD-10-CM | POA: Insufficient documentation

## 2015-09-22 DIAGNOSIS — Y92129 Unspecified place in nursing home as the place of occurrence of the external cause: Secondary | ICD-10-CM | POA: Insufficient documentation

## 2015-09-22 DIAGNOSIS — S299XXA Unspecified injury of thorax, initial encounter: Secondary | ICD-10-CM | POA: Diagnosis present

## 2015-09-22 DIAGNOSIS — S20219A Contusion of unspecified front wall of thorax, initial encounter: Secondary | ICD-10-CM | POA: Insufficient documentation

## 2015-09-22 DIAGNOSIS — Y998 Other external cause status: Secondary | ICD-10-CM | POA: Diagnosis not present

## 2015-09-22 DIAGNOSIS — R079 Chest pain, unspecified: Secondary | ICD-10-CM

## 2015-09-22 DIAGNOSIS — I10 Essential (primary) hypertension: Secondary | ICD-10-CM | POA: Insufficient documentation

## 2015-09-22 DIAGNOSIS — Z791 Long term (current) use of non-steroidal anti-inflammatories (NSAID): Secondary | ICD-10-CM | POA: Diagnosis not present

## 2015-09-22 MED ORDER — KETOROLAC TROMETHAMINE 60 MG/2ML IM SOLN
30.0000 mg | Freq: Once | INTRAMUSCULAR | Status: AC
  Administered 2015-09-22: 30 mg via INTRAMUSCULAR
  Filled 2015-09-22: qty 2

## 2015-09-22 MED ORDER — ALBUTEROL SULFATE HFA 108 (90 BASE) MCG/ACT IN AERS: 1.0000 | INHALATION_SPRAY | Freq: Four times a day (QID) | RESPIRATORY_TRACT | Status: DC | PRN

## 2015-09-22 MED ORDER — IPRATROPIUM-ALBUTEROL 0.5-2.5 (3) MG/3ML IN SOLN
3.0000 mL | Freq: Once | RESPIRATORY_TRACT | Status: AC
  Administered 2015-09-22: 3 mL via RESPIRATORY_TRACT
  Filled 2015-09-22: qty 3
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
## 2015-09-28 ENCOUNTER — Encounter (HOSPITAL_COMMUNITY): Payer: Self-pay | Admitting: Emergency Medicine

## 2015-09-28 ENCOUNTER — Observation Stay (HOSPITAL_COMMUNITY)
Admission: EM | Admit: 2015-09-28 | Discharge: 2015-10-02 | Disposition: A | Discharge status: Home / Self Care | Payer: Self-pay | Attending: Internal Medicine | Admitting: Internal Medicine

## 2015-09-28 ENCOUNTER — Emergency Department (HOSPITAL_COMMUNITY): Payer: Self-pay

## 2015-09-28 DIAGNOSIS — I10 Essential (primary) hypertension: Secondary | ICD-10-CM | POA: Diagnosis present

## 2015-09-28 DIAGNOSIS — M549 Dorsalgia, unspecified: Secondary | ICD-10-CM | POA: Insufficient documentation

## 2015-09-28 DIAGNOSIS — M79602 Pain in left arm: Secondary | ICD-10-CM | POA: Insufficient documentation

## 2015-09-28 DIAGNOSIS — Y9289 Other specified places as the place of occurrence of the external cause: Secondary | ICD-10-CM | POA: Insufficient documentation

## 2015-09-28 DIAGNOSIS — J45909 Unspecified asthma, uncomplicated: Secondary | ICD-10-CM | POA: Diagnosis present

## 2015-09-28 DIAGNOSIS — Z23 Encounter for immunization: Secondary | ICD-10-CM | POA: Insufficient documentation

## 2015-09-28 DIAGNOSIS — E119 Type 2 diabetes mellitus without complications: Secondary | ICD-10-CM | POA: Diagnosis present

## 2015-09-28 DIAGNOSIS — E1149 Type 2 diabetes mellitus with other diabetic neurological complication: Secondary | ICD-10-CM | POA: Diagnosis present

## 2015-09-28 DIAGNOSIS — R0789 Other chest pain: Principal | ICD-10-CM | POA: Insufficient documentation

## 2015-09-28 DIAGNOSIS — E1165 Type 2 diabetes mellitus with hyperglycemia: Secondary | ICD-10-CM | POA: Insufficient documentation

## 2015-09-28 DIAGNOSIS — M79651 Pain in right thigh: Secondary | ICD-10-CM | POA: Insufficient documentation

## 2015-09-28 DIAGNOSIS — Y990 Civilian activity done for income or pay: Secondary | ICD-10-CM | POA: Insufficient documentation

## 2015-09-28 DIAGNOSIS — Y9389 Activity, other specified: Secondary | ICD-10-CM | POA: Insufficient documentation

## 2015-09-28 DIAGNOSIS — R079 Chest pain, unspecified: Secondary | ICD-10-CM | POA: Diagnosis present

## 2015-09-28 LAB — CBC
HEMATOCRIT: 42.6 % (ref 36.0–46.0)
Hemoglobin: 13.6 g/dL (ref 12.0–15.0)
MCH: 29.6 pg (ref 26.0–34.0)
MCHC: 31.9 g/dL (ref 30.0–36.0)
MCV: 92.8 fL (ref 78.0–100.0)
Platelets: 249 10*3/uL (ref 150–400)
RBC: 4.59 MIL/uL (ref 3.87–5.11)
RDW: 14.5 % (ref 11.5–15.5)
WBC: 6.1 10*3/uL (ref 4.0–10.5)

## 2015-09-28 LAB — BASIC METABOLIC PANEL
Anion gap: 6 (ref 5–15)
BUN: 14 mg/dL (ref 6–20)
CHLORIDE: 107 mmol/L (ref 101–111)
CO2: 26 mmol/L (ref 22–32)
CREATININE: 0.79 mg/dL (ref 0.44–1.00)
Calcium: 9.7 mg/dL (ref 8.9–10.3)
GFR calc Af Amer: >60 mL/min (ref >60)
GFR calc non Af Amer: >60 mL/min (ref >60)
GLUCOSE: 147 mg/dL — AB (ref 65–99)
POTASSIUM: 4 mmol/L (ref 3.5–5.1)
Sodium: 139 mmol/L (ref 135–145)

## 2015-09-28 LAB — I-STAT TROPONIN, ED: Troponin i, poc: 0 ng/mL (ref 0.00–0.08)

## 2015-09-28 MED ORDER — SODIUM CHLORIDE 0.9 % IJ SOLN
3.0000 mL | Freq: Two times a day (BID) | INTRAMUSCULAR | Status: DC
  Administered 2015-09-28 – 2015-10-02 (×8): 3 mL via INTRAVENOUS

## 2015-09-28 MED ORDER — ONDANSETRON HCL 4 MG/2ML IJ SOLN: 4.0000 mg | Freq: Four times a day (QID) | INTRAMUSCULAR | Status: DC | PRN

## 2015-09-28 MED ORDER — PNEUMOCOCCAL VAC POLYVALENT 25 MCG/0.5ML IJ INJ
0.5000 mL | INJECTION | INTRAMUSCULAR | Status: AC
  Administered 2015-09-29: 0.5 mL via INTRAMUSCULAR
  Filled 2015-09-28 (×2): qty 0.5

## 2015-09-28 MED ORDER — NITROGLYCERIN 0.4 MG SL SUBL
0.4000 mg | SUBLINGUAL_TABLET | SUBLINGUAL | Status: DC | PRN
  Administered 2015-09-28: 0.4 mg via SUBLINGUAL
  Filled 2015-09-28: qty 1

## 2015-09-28 MED ORDER — ACETAMINOPHEN 325 MG PO TABS
650.0000 mg | ORAL_TABLET | ORAL | Status: DC | PRN
  Administered 2015-09-30: 650 mg via ORAL
  Filled 2015-09-28: qty 2

## 2015-09-28 MED ORDER — KETOROLAC TROMETHAMINE 30 MG/ML IJ SOLN
30.0000 mg | Freq: Once | INTRAMUSCULAR | Status: AC
  Administered 2015-09-28: 30 mg via INTRAVENOUS
  Filled 2015-09-28: qty 1

## 2015-09-28 MED ORDER — INFLUENZA VAC SPLIT QUAD 0.5 ML IM SUSY
0.5000 mL | PREFILLED_SYRINGE | INTRAMUSCULAR | Status: AC
  Administered 2015-09-29: 0.5 mL via INTRAMUSCULAR
  Filled 2015-09-28 (×2): qty 0.5

## 2015-09-28 MED ORDER — ONDANSETRON HCL 4 MG/2ML IJ SOLN
4.0000 mg | Freq: Once | INTRAMUSCULAR | Status: AC
Start: 2015-09-28 — End: 2015-09-28
  Administered 2015-09-28: 4 mg via INTRAVENOUS
  Filled 2015-09-28: qty 2

## 2015-09-28 MED ORDER — ENOXAPARIN SODIUM 40 MG/0.4ML ~~LOC~~ SOLN
40.0000 mg | Freq: Every day | SUBCUTANEOUS | Status: DC
  Administered 2015-09-28 – 2015-10-01 (×4): 40 mg via SUBCUTANEOUS
  Filled 2015-09-28 (×5): qty 0.4

## 2015-09-28 MED ORDER — FAMOTIDINE 20 MG PO TABS
20.0000 mg | ORAL_TABLET | Freq: Two times a day (BID) | ORAL | Status: DC
Start: 2015-09-28 — End: 2015-10-02
  Administered 2015-09-28 – 2015-10-02 (×8): 20 mg via ORAL
  Filled 2015-09-28 (×10): qty 1

## 2015-09-28 MED ORDER — OXYCODONE-ACETAMINOPHEN 5-325 MG PO TABS
1.0000 | ORAL_TABLET | ORAL | Status: DC | PRN
  Administered 2015-09-28 – 2015-10-02 (×13): 1 via ORAL
  Filled 2015-09-28 (×16): qty 1

## 2015-09-28 MED ORDER — ASPIRIN 81 MG PO CHEW
324.0000 mg | CHEWABLE_TABLET | Freq: Once | ORAL | Status: AC
  Administered 2015-09-28: 324 mg via ORAL
  Filled 2015-09-28: qty 4
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
## 2015-09-28 NOTE — ED Notes (Signed)
MD at bedside. Hospitalist at bedside. 

## 2015-09-28 NOTE — ED Provider Notes (Signed)
Patient signed out from [facility], PA-C, at end of shift. Complaint of CP, SOB, upper back pain radiating to left arm, aching in nature, started last night, constant. No nausea or vomiting. She had an unremarkable work up for concerning chest pain. Given one SL NTG with improvement in her pain. History of HTN. Plan to admit for r/o and possible in-hospital stress test. Hospitalist paged for admission on sign out. Dr. Olevia Bowens accepting for [facility].  Charlann Lange, PA-C 09/28/15 2054  Sharlett Iles, MD 09/28/15 [phone]

## 2015-09-29 ENCOUNTER — Observation Stay (HOSPITAL_BASED_OUTPATIENT_CLINIC_OR_DEPARTMENT_OTHER): Payer: Self-pay

## 2015-09-29 DIAGNOSIS — I10 Essential (primary) hypertension: Secondary | ICD-10-CM | POA: Diagnosis present

## 2015-09-29 DIAGNOSIS — J45909 Unspecified asthma, uncomplicated: Secondary | ICD-10-CM | POA: Diagnosis present

## 2015-09-29 DIAGNOSIS — R079 Chest pain, unspecified: Secondary | ICD-10-CM

## 2015-09-29 DIAGNOSIS — E119 Type 2 diabetes mellitus without complications: Secondary | ICD-10-CM | POA: Diagnosis present

## 2015-09-29 DIAGNOSIS — R0789 Other chest pain: Principal | ICD-10-CM

## 2015-09-29 DIAGNOSIS — E1149 Type 2 diabetes mellitus with other diabetic neurological complication: Secondary | ICD-10-CM | POA: Diagnosis present

## 2015-09-29 HISTORY — PX: TRANSTHORACIC ECHOCARDIOGRAM: SHX275

## 2015-09-29 LAB — LIPID PANEL
CHOLESTEROL: 189 mg/dL (ref 0–200)
HDL: 47 mg/dL (ref >40)
LDL Cholesterol: 121 mg/dL — ABNORMAL HIGH (ref 0–99)
Total CHOL/HDL Ratio: 4 RATIO
Triglycerides: 107 mg/dL (ref <150)
VLDL: 21 mg/dL (ref 0–40)

## 2015-09-29 LAB — TROPONIN I

## 2015-09-29 NOTE — Progress Notes (Signed)
Pt admitted after midnight.  Please refer to admission note 09/29/2015.  Pt admitted for chest contusion and chest pain. - Trop negative so far - Will continue observation for 24 hours and likely [facility] tomorrow 10/9   Leisa Lenz [facility] [phone]

## 2015-09-29 NOTE — Progress Notes (Signed)
  Echocardiogram 2D Echocardiogram has been performed.  [name] 09/29/2015, 9:37 AM
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
## 2015-09-30 MED ORDER — LORAZEPAM 1 MG PO TABS
1.0000 mg | ORAL_TABLET | Freq: Once | ORAL | Status: AC
  Administered 2015-09-30: 1 mg via ORAL
  Filled 2015-09-30: qty 1

## 2015-09-30 MED ORDER — ALBUTEROL SULFATE (2.5 MG/3ML) 0.083% IN NEBU
3.0000 mL | INHALATION_SOLUTION | Freq: Four times a day (QID) | RESPIRATORY_TRACT | Status: DC | PRN
  Administered 2015-09-30 (×2): 3 mL via RESPIRATORY_TRACT
  Filled 2015-09-30 (×3): qty 3

## 2015-09-30 MED ORDER — METHYLPREDNISOLONE SODIUM SUCC 125 MG IJ SOLR
80.0000 mg | Freq: Once | INTRAMUSCULAR | Status: AC
  Administered 2015-09-30: 80 mg via INTRAVENOUS
  Filled 2015-09-30: qty 1.28

## 2015-09-30 MED ORDER — IPRATROPIUM-ALBUTEROL 18-103 MCG/ACT IN AERO: 1.0000 | INHALATION_SPRAY | RESPIRATORY_TRACT | Status: DC | PRN

## 2015-09-30 MED ORDER — IPRATROPIUM-ALBUTEROL 0.5-2.5 (3) MG/3ML IN SOLN
3.0000 mL | RESPIRATORY_TRACT | Status: DC
  Administered 2015-09-30 – 2015-10-02 (×15): 3 mL via RESPIRATORY_TRACT
  Filled 2015-09-30 (×15): qty 3

## 2015-09-30 MED ORDER — PREDNISONE 50 MG PO TABS: 50.0000 mg | ORAL_TABLET | Freq: Every day | ORAL | Status: DC

## 2015-09-30 MED ORDER — DOCUSATE SODIUM 100 MG PO CAPS
100.0000 mg | ORAL_CAPSULE | Freq: Two times a day (BID) | ORAL | Status: DC
Start: 2015-09-30 — End: 2015-10-02
  Administered 2015-09-30 – 2015-10-02 (×4): 100 mg via ORAL
  Filled 2015-09-30 (×6): qty 1

## 2015-09-30 NOTE — Discharge Instructions (Addendum)
Diabetes Mellitus and Food It is important for you to manage your blood sugar (glucose) level. Your blood glucose level can be greatly affected by what you eat. Eating healthier foods in the appropriate amounts throughout the day at about the same time each day will help you control your blood glucose level. It can also help slow or prevent worsening of your diabetes mellitus. Healthy eating may even help you improve the level of your blood pressure and reach or maintain a healthy weight.  General recommendations for healthful eating and cooking habits include:  Eating meals and snacks regularly. Avoid going long periods of time without eating to lose weight.  Eating a diet that consists mainly of plant-based foods, such as fruits, vegetables, nuts, legumes, and whole grains.  Using low-heat cooking methods, such as baking, instead of high-heat cooking methods, such as deep frying. Work with your dietitian to make sure you understand how to use the Nutrition Facts information on food labels. HOW CAN FOOD AFFECT ME? Carbohydrates Carbohydrates affect your blood glucose level more than any other type of food. Your dietitian will help you determine how many carbohydrates to eat at each meal and teach you how to count carbohydrates. Counting carbohydrates is important to keep your blood glucose at a healthy level, especially if you are using insulin or taking certain medicines for diabetes mellitus. Alcohol Alcohol can cause sudden decreases in blood glucose (hypoglycemia), especially if you use insulin or take certain medicines for diabetes mellitus. Hypoglycemia can be a life-threatening condition. Symptoms of hypoglycemia (sleepiness, dizziness, and disorientation) are similar to symptoms of having too much alcohol.  If your health care provider has given you approval to drink alcohol, do so in moderation and use the following guidelines:  Women should not have more than one drink per day, and men  should not have more than two drinks per day. One drink is equal to:  12 oz of beer.  5 oz of wine.  1 oz of hard liquor.  Do not drink on an empty stomach.  Keep yourself hydrated. Have water, diet soda, or unsweetened iced tea.  Regular soda, juice, and other mixers might contain a lot of carbohydrates and should be counted. WHAT FOODS ARE NOT RECOMMENDED? As you make food choices, it is important to remember that all foods are not the same. Some foods have fewer nutrients per serving than other foods, even though they might have the same number of calories or carbohydrates. It is difficult to get your body what it needs when you eat foods with fewer nutrients. Examples of foods that you should avoid that are high in calories and carbohydrates but low in nutrients include:  Trans fats (most processed foods list trans fats on the Nutrition Facts label).  Regular soda.  Juice.  Candy.  Sweets, such as cake, pie, doughnuts, and cookies.  Fried foods. WHAT FOODS CAN I EAT? Eat nutrient-rich foods, which will nourish your body and keep you healthy. The food you should eat also will depend on several factors, including:  The calories you need.  The medicines you take.  Your weight.  Your blood glucose level.  Your blood pressure level.  Your cholesterol level. You should eat a variety of foods, including:  Protein.  Lean cuts of meat.  Proteins low in saturated fats, such as fish, egg whites, and beans. Avoid processed meats.  Fruits and vegetables.  Fruits and vegetables that may help control blood glucose levels, such as apples, mangoes, and   yams.  Dairy products.  Choose fat-free or low-fat dairy products, such as milk, yogurt, and cheese.  Grains, bread, pasta, and rice.  Choose whole grain products, such as multigrain bread, whole oats, and brown rice. These foods may help control blood pressure.  Fats.  Foods containing healthful fats, such as nuts,  avocado, olive oil, canola oil, and fish. DOES EVERYONE WITH DIABETES MELLITUS HAVE THE SAME MEAL PLAN? Because every person with diabetes mellitus is different, there is not one meal plan that works for everyone. It is very important that you meet with a dietitian who will help you create a meal plan that is just right for you.   This information is not intended to replace advice given to you by your health care provider. Make sure you discuss any questions you have with your health care provider.   Document Released: 09/04/2005 Document Revised: 12/29/2014 Document Reviewed: 11/04/2013 Elsevier Interactive Patient Education 2016 Elsevier Inc.   Glipizide tablets What is this medicine? GLIPIZIDE (GLIP i zide) helps to treat type 2 diabetes. Treatment is combined with diet and exercise. The medicine helps your body to use insulin better. This medicine may be used for other purposes; ask your health care provider or pharmacist if you have questions. What should I tell my health care provider before I take this medicine? They need to know if you have any of these conditions: -diabetic ketoacidosis -glucose-6-phosphate dehydrogenase deficiency -heart disease -kidney disease -liver disease -porphyria -severe infection or injury -thyroid disease -an unusual or allergic reaction to glipizide, sulfa drugs, other medicines, foods, dyes, or preservatives -pregnant or trying to get pregnant -breast-feeding How should I use this medicine? Take this medicine by mouth. Swallow with a drink of water. Do not take with food. Take it 30 minutes before a meal. Follow the directions on the prescription label. If you take this medicine once a day, take it 30 minutes before breakfast. Take your doses at the same time each day. Do not take more often than directed. Talk to your pediatrician regarding the use of this medicine in children. Special care may be needed. Elderly patients over 77 years old may  have a stronger reaction and need a smaller dose. Overdosage: If you think you have taken too much of this medicine contact a poison control center or emergency room at once. NOTE: This medicine is only for you. Do not share this medicine with others. What if I miss a dose? If you miss a dose, take it as soon as you can. If it is almost time for your next dose, take only that dose. Do not take double or extra doses. What may interact with this medicine? -bosentan -chloramphenicol -cisapride -clarithromycin -medicines for fungal or yeast infections -metoclopramide -probenecid -warfarin Many medications may cause an increase or decrease in blood sugar, these include: -alcohol containing beverages -aspirin and aspirin-like drugs -chloramphenicol -chromium -diuretics -female hormones, like estrogens or progestins and birth control pills -heart medicines -isoniazid -female hormones or anabolic steroids -medicines for weight loss -medicines for allergies, asthma, cold, or cough -medicines for mental problems -medicines called MAO Inhibitors like Nardil, Parnate, Marplan, Eldepryl -niacin -NSAIDs, medicines for pain and inflammation, like ibuprofen or naproxen -pentamidine -phenytoin -probenecid -quinolone antibiotics like ciprofloxacin, levofloxacin, ofloxacin -some herbal dietary supplements -steroid medicines like prednisone or cortisone -thyroid medicine This list may not describe all possible interactions. Give your health care provider a list of all the medicines, herbs, non-prescription drugs, or dietary supplements you use. Also tell them  if you smoke, drink alcohol, or use illegal drugs. Some items may interact with your medicine. What should I watch for while using this medicine? Visit your doctor or health care professional for regular checks on your progress. A test called the HbA1C (A1C) will be monitored. This is a simple blood test. It measures your blood sugar control  over the last 2 to 3 months. You will receive this test every 3 to 6 months. Learn how to check your blood sugar. Learn the symptoms of low and high blood sugar and how to manage them. Always carry a quick-source of sugar with you in case you have symptoms of low blood sugar. Examples include hard sugar candy or glucose tablets. Make sure others know that you can choke if you eat or drink when you develop serious symptoms of low blood sugar, such as seizures or unconsciousness. They must get medical help at once. Tell your doctor or health care professional if you have high blood sugar. You might need to change the dose of your medicine. If you are sick or exercising more than usual, you might need to change the dose of your medicine. Do not skip meals. Ask your doctor or health care professional if you should avoid alcohol. Many nonprescription cough and cold products contain sugar or alcohol. These can affect blood sugar. This medicine can make you more sensitive to the sun. Keep out of the sun. If you cannot avoid being in the sun, wear protective clothing and use sunscreen. Do not use sun lamps or tanning beds/booths. Wear a medical ID bracelet or chain, and carry a card that describes your disease and details of your medicine and dosage times. What side effects may I notice from receiving this medicine? Side effects that you should report to your doctor or health care professional as soon as possible: -allergic reactions like skin rash, itching or hives, swelling of the face, lips, or tongue -breathing problems -dark urine -fever, chills, sore throat -signs and symptoms of low blood sugar such as feeling anxious, confusion, dizziness, increased hunger, unusually weak or tired, sweating, shakiness, cold, irritable, headache, blurred vision, fast heartbeat, loss of consciousness -unusual bleeding or bruising -yellowing of the eyes or skin Side effects that usually do not require medical attention  (report to your doctor or health care professional if they continue or are bothersome): -diarrhea -dizziness -headache -heartburn -nausea -stomach gas This list may not describe all possible side effects. Call your doctor for medical advice about side effects. You may report side effects to FDA at 1-800-FDA-1088. Where should I keep my medicine? Keep out of the reach of children. Store at room temperature below 30 degrees C (86 degrees F). Throw away any unused medicine after the expiration date. NOTE: This sheet is a summary. It may not cover all possible information. If you have questions about this medicine, talk to your doctor, pharmacist, or health care provider.    2016, Elsevier/Gold Standard. (2013-03-23 14:42:46)   Chest Pain Observation It is often hard to give a specific diagnosis for the cause of chest pain. Among other possibilities your symptoms might be caused by inadequate oxygen delivery to your heart (angina). Angina that is not treated or evaluated can lead to a heart attack (myocardial infarction) or death. Blood tests, electrocardiograms, and X-rays may have been done to help determine a possible cause of your chest pain. After evaluation and observation, your health care provider has determined that it is unlikely your pain was caused by  an unstable condition that requires hospitalization. However, a full evaluation of your pain may need to be completed, with additional diagnostic testing as directed. It is very important to keep your follow-up appointments. Not keeping your follow-up appointments could result in permanent heart damage, disability, or death. If there is any problem keeping your follow-up appointments, you must call your health care provider. HOME CARE INSTRUCTIONS  Due to the slight chance that your pain could be angina, it is important to follow your health care provider's treatment plan and also maintain a healthy lifestyle:  Maintain or work toward  achieving a healthy weight.  Stay physically active and exercise regularly.  Decrease your salt intake.  Eat a balanced, healthy diet. Talk to a dietitian to learn about heart-healthy foods.  Increase your fiber intake by including whole grains, vegetables, fruits, and nuts in your diet.  Avoid situations that cause stress, anger, or depression.  Take medicines as advised by your health care provider. Report any side effects to your health care provider. Do not stop medicines or adjust the dosages on your own.  Quit smoking. Do not use nicotine patches or gum until you check with your health care provider.  Keep your blood pressure, blood sugar, and cholesterol levels within normal limits.  Limit alcohol intake to no more than 1 drink per day for women who are not pregnant and 2 drinks per day for men.  Do not abuse drugs. SEEK IMMEDIATE MEDICAL CARE IF: You have severe chest pain or pressure which may include symptoms such as:  You feel pain or pressure in your arms, neck, jaw, or back.  You have severe back or abdominal pain, feel sick to your stomach (nauseous), or throw up (vomit).  You are sweating profusely.  You are having a fast or irregular heartbeat.  You feel short of breath while at rest.  You notice increasing shortness of breath during rest, sleep, or with activity.  You have chest pain that does not get better after rest or after taking your usual medicine.  You wake from sleep with chest pain.  You are unable to sleep because you cannot breathe.  You develop a frequent cough or you are coughing up blood.  You feel dizzy, faint, or experience extreme fatigue.  You develop severe weakness, dizziness, fainting, or chills. Any of these symptoms may represent a serious problem that is an emergency. Do not wait to see if the symptoms will go away. Call your local emergency services (911 in the [place] Do not drive yourself to the hospital. MAKE SURE  YOU:  Understand these instructions.  Will watch your condition.  Will get help right away if you are not doing well or get worse.   This information is not intended to replace advice given to you by your health care provider. Make sure you discuss any questions you have with your health care provider.   Document Released: 01/10/2011 Document Revised: 12/13/2013 Document Reviewed: 06/09/2013 Elsevier Interactive Patient Education [employer].

## 2015-09-30 NOTE — Progress Notes (Signed)
Pt noted to be resting quietly with continuous pulse ox thru telemetry monitor on.  1356/99, 90,18,100% on 2ll/m [facility], 97.9.  Will continue to monitor.

## 2015-09-30 NOTE — Progress Notes (Signed)
Family came out and alerted staff that patient was having trouble breathing. Family stated that she was drinking milk and couldn't breath. CN came into room and patient was diaphoretic and struggling to breath. CN got patient to sit up and purse lip breath while calling Rapid response and Respiratory. Breathing treatment given. Oxygen saturation's between 92-95%. Patient crying. Paged MD. 1mg  ativan ordered. Patient calmed down and in stable condition. Will continue to monitor. Marland Kitchen

## 2015-09-30 NOTE — Progress Notes (Signed)
Pt's daughter alerted charge nurse that resident needed assistance.  Pt was shaky, tearful, c/o pain in left palm and inability to grasp. EKG revealed sinus tach.    Pt scheduled for d/c today.  Daughter told this nurse that she did not want her mother discharged today because she feels her mother is not in the condition to be discharged.  Alerted Dr. Charlies Silvers as to daughter's request, per Dr. Charlies Silvers may hold pt for 24 hours, but feels that pt is appropriate for discharge.  Case management alerted to situation.

## 2015-09-30 NOTE — Progress Notes (Signed)
Pt having noticeable wheezing - sats remaind 100% on room air.  dtr states and so does PTA info that pt takes provental 108 q6 hours prn at home.  Not ordered here.  dtr states pt usually takes it 3 to 4 times a day 2 puffs.  K. sckoor on call and notified and am awaiting response.
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
## 2015-09-30 NOTE — Progress Notes (Signed)
Situation changed very rapidly by 0245 pt in a great deal of distress loud wheezes, very diminished BS in bases, sweating, spitting up small amount of clear liquid.  124/79, rr=24, pulse 78-125, remained 100% ra.  RT called and while awaiting Amy RT arrival I  Notified K. Sckoor who is on call and got order for albuterol then I spoke  with pharmacy who substituted, per protocol, nebulizer for inhaler.  First neb started at that time and Rapid Response team called.  Amy RT in and gave pt second neb.  VSS and tele monitor remains SR.  Hans RN, RRT nurse in at 0300 pt continues with wheezes and decreased BS, placed on 2l/m [place] and assessed pt who remian CMS WNl.  RRT rn, Gilmer Mor,  believes this is a bronchospasm VS at 0305 139/79, t=97.7, rr=20, HR=66 and sat on 2l/m [place] is 99 to 100%.  BS stronger throughout and less wheezing noted.  Gilmer Mor spoke with K Schoor and a one time dose of 80 mg of solumedrol IV ordered and given.  Pt now has order for q4 hour RT.  Day MD is to determine if pt should continue to receive steroid PO or IV during morning rounds.

## 2015-09-30 NOTE — Progress Notes (Signed)
Patient daughter cussing and screaming in patient room. CN asked for family to stop yelling. Patient stated that they would be quiet.

## 2015-10-01 DIAGNOSIS — R739 Hyperglycemia, unspecified: Secondary | ICD-10-CM

## 2015-10-01 DIAGNOSIS — I10 Essential (primary) hypertension: Secondary | ICD-10-CM

## 2015-10-01 LAB — HEMOGLOBIN A1C
Hgb A1c MFr Bld: 7.8 % — ABNORMAL HIGH (ref 4.8–5.6)
Mean Plasma Glucose: 177 mg/dL

## 2015-10-01 MED ORDER — POLYETHYLENE GLYCOL 3350 17 G PO PACK
17.0000 g | PACK | Freq: Every day | ORAL | Status: DC
  Administered 2015-10-01 – 2015-10-02 (×2): 17 g via ORAL
  Filled 2015-10-01: qty 1

## 2015-10-01 MED ORDER — MAGNESIUM HYDROXIDE 400 MG/5ML PO SUSP
30.0000 mL | Freq: Once | ORAL | Status: AC
  Administered 2015-10-01: 30 mL via ORAL
  Filled 2015-10-01: qty 30
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
## 2015-10-02 DIAGNOSIS — J45909 Unspecified asthma, uncomplicated: Secondary | ICD-10-CM

## 2015-10-02 DIAGNOSIS — E119 Type 2 diabetes mellitus without complications: Secondary | ICD-10-CM

## 2015-10-02 DIAGNOSIS — J4520 Mild intermittent asthma, uncomplicated: Secondary | ICD-10-CM

## 2015-10-02 MED ORDER — GLIPIZIDE 5 MG PO TABS: 2.5000 mg | ORAL_TABLET | Freq: Every day | ORAL | Status: DC

## 2015-10-02 MED ORDER — OXYCODONE-ACETAMINOPHEN 5-325 MG PO TABS: 1.0000 | ORAL_TABLET | ORAL | Status: DC | PRN

## 2015-10-02 NOTE — Hospital Discharge Follow-Up (Signed)
Met with the patient at the request of Dessa Phi, RN CM.  Explained the services provided at the [facility] , including financial counseling, pharmacy assistance and social work.  Provided her with the phone # for the social worker, Vesta Mixer.  Also provided her with the applications for the [facility] and [employer] as well as the walk in hours for financial assistance at [facility].  Stressed with her the importance of coming to her scheduled appointment on 10/10/15 @ 1200.  She stated that she would be there and has transportation.   Also explained that there is a pharmacist at [facility] who can provide education regarding medication management and an appointment can be scheduled when she comes to her appointment on 10/10/15. She was very appreciative of the information provided.   Updated provided to K. Mahabir, RN CM.
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
## 2015-10-02 NOTE — Progress Notes (Signed)
Pt educated on discharge instructions provided by MD. Pt and family stated understood instructions. IV removed from patient per discharge order. Pt discharged from hospital via wheelchair.

## 2015-10-02 NOTE — Care Management Note (Signed)
Case Management Note  Patient Details  Name: [name] MRN: [id number] Date of Birth: May 13, 1966  Subjective/Objective:  60 y/of admitted w/chest pain. nNo pcp,no insurance.[place] appt set, [facility] liason will eval & check if appropriate for services.Patient in agreement.She will be able to get meds filled @ [facility].Provided w/community resources.Patient voiced understanding.                  Action/Plan:d/c home.   Expected Discharge Date:                  Expected Discharge Plan:  Home/Self Care  In-House Referral:     Discharge planning Services  CM Consult, [facility]  Post Acute Care Choice:    Choice offered to:     DME Arranged:    DME Agency:     HH Arranged:    [place] Agency:     Status of Service:  Completed, signed off  Medicare Important Message Given:    Date Medicare IM Given:    Medicare IM give by:    Date Additional Medicare IM Given:    Additional Medicare Important Message give by:     If discussed at [place] of Stay Meetings, dates discussed:    Additional Comments:  Dessa Phi, RN 10/02/2015, 11:23 AM

## 2015-10-02 NOTE — Care Management Note (Signed)
Case Management Note  Patient Details  Name: [name] MRN: [id number] Date of Birth: [birth year]  Subjective/Objective: PT-recc rw.Patient has no insurance.Per [facility] dme rep-Patient can get rw from [facility], patient in agreement.Patient has address.Nsg aware.                 Action/Plan:d/c home no further d/c needs.   Expected Discharge Date:                  Expected Discharge Plan:  Home/Self Care  In-House Referral:     Discharge planning Services  CM Consult, [facility]  Post Acute Care Choice:    Choice offered to:     DME Arranged:  Walker rolling DME Agency:     HH Arranged:    HH Agency:     Status of Service:  Completed, signed off  Medicare Important Message Given:    Date Medicare IM Given:    Medicare IM give by:    Date Additional Medicare IM Given:    Additional Medicare Important Message give by:     If discussed at [place] of Stay Meetings, dates discussed:    Additional Comments:  Dessa Phi, RN 10/02/2015, 7:42 PM
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
## 2015-10-10 ENCOUNTER — Ambulatory Visit: Payer: Self-pay | Attending: Family Medicine | Admitting: Family Medicine

## 2015-10-10 ENCOUNTER — Ambulatory Visit: Payer: Worker's Compensation | Admitting: Pharmacist

## 2015-10-10 ENCOUNTER — Encounter: Payer: Self-pay | Admitting: Family Medicine

## 2015-10-10 VITALS — BP 133/93 | HR 77 | Temp 98.2°F | Resp 18 | Ht 61.0 in | Wt 193.0 lb

## 2015-10-10 DIAGNOSIS — M545 Low back pain, unspecified: Secondary | ICD-10-CM

## 2015-10-10 DIAGNOSIS — I10 Essential (primary) hypertension: Secondary | ICD-10-CM

## 2015-10-10 DIAGNOSIS — R739 Hyperglycemia, unspecified: Secondary | ICD-10-CM

## 2015-10-10 DIAGNOSIS — E119 Type 2 diabetes mellitus without complications: Secondary | ICD-10-CM

## 2015-10-10 DIAGNOSIS — J4520 Mild intermittent asthma, uncomplicated: Secondary | ICD-10-CM

## 2015-10-10 DIAGNOSIS — M549 Dorsalgia, unspecified: Secondary | ICD-10-CM | POA: Insufficient documentation

## 2015-10-10 DIAGNOSIS — R0789 Other chest pain: Secondary | ICD-10-CM

## 2015-10-10 LAB — GLUCOSE, POCT (MANUAL RESULT ENTRY): POC GLUCOSE: 169 mg/dL — AB (ref 70–99)

## 2015-10-10 MED ORDER — TRUEPLUS LANCETS 28G MISC: 1.0000 | Freq: Two times a day (BID) | Status: DC

## 2015-10-10 MED ORDER — BECLOMETHASONE DIPROPIONATE 80 MCG/ACT IN AERS: 1.0000 | INHALATION_SPRAY | Freq: Two times a day (BID) | RESPIRATORY_TRACT | Status: DC

## 2015-10-10 MED ORDER — GLUCOSE BLOOD VI STRP: ORAL_STRIP | Status: DC

## 2015-10-10 MED ORDER — CYCLOBENZAPRINE HCL 10 MG PO TABS: 10.0000 mg | ORAL_TABLET | Freq: Three times a day (TID) | ORAL | Status: DC | PRN

## 2015-10-10 MED ORDER — TRUE METRIX METER DEVI: 1.0000 | Freq: Two times a day (BID) | Status: DC
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
## 2015-10-10 NOTE — Patient Instructions (Signed)
Hypoglycemia Low blood sugar (hypoglycemia) means that the level of sugar in your blood is lower than it should be. Signs of low blood sugar include:  Getting sweaty.  Feeling hungry.  Feeling dizzy or weak.  Feeling sleepier than normal.  Feeling nervous.  Headaches.  Having a fast heartbeat. Low blood sugar can happen fast and can be an emergency. Your doctor can do tests to check your blood sugar level. You can have low blood sugar and not have diabetes. HOME CARE  Check your blood sugar as told by your doctor. If it is less than 70 mg/dl or as told by your doctor, take 1 of the following:  3 to 4 glucose tablets.   cup clear juice.   cup soda pop, not diet.  1 cup milk.  5 to 6 hard candies.  Recheck blood sugar after 15 minutes. Repeat until it is at the right level.  Eat a snack if it is more than 1 hour until the next meal.  Only take medicine as told by your doctor.  Do not skip meals. Eat on time.  Do not drink alcohol except with meals.  Check your blood glucose before driving.  Check your blood glucose before and after exercise.  Always carry treatment with you, such as glucose pills.  Always wear a medical alert bracelet if you have diabetes. GET HELP RIGHT AWAY IF:   Your blood glucose goes below 70 mg/dl or as told by your doctor, and you:  Are confused.  Are not able to swallow.  Pass out (faint).  You cannot treat yourself. You may need someone to help you.  You have low blood sugar problems often.  You have problems from your medicines.  You are not feeling better after 3 to 4 days.  You have vision changes. MAKE SURE YOU:   Understand these instructions.  Will watch this condition.  Will get help right away if you are not doing well or get worse.   This information is not intended to replace advice given to you by your health care provider. Make sure you discuss any questions you have with your health care provider.     Document Released: 03/04/2010 Document Revised: 12/29/2014 Document Reviewed: 08/14/2015 Elsevier Interactive Patient Education 2016 Elsevier Inc. Blood Glucose Monitoring, Adult Monitoring your blood glucose (also know as blood sugar) helps you to manage your diabetes. It also helps you and your health care provider monitor your diabetes and determine how well your treatment plan is working. WHY SHOULD YOU MONITOR YOUR BLOOD GLUCOSE?  It can help you understand how food, exercise, and medicine affect your blood glucose.  It allows you to know what your blood glucose is at any given moment. You can quickly tell if you are having low blood glucose (hypoglycemia) or high blood glucose (hyperglycemia).  It can help you and your health care provider know how to adjust your medicines.  It can help you understand how to manage an illness or adjust medicine for exercise. WHEN SHOULD YOU TEST? Your health care provider will help you decide how often you should check your blood glucose. This may depend on the type of diabetes you have, your diabetes control, or the types of medicines you are taking. Be sure to write down all of your blood glucose readings so that this information can be reviewed with your health care provider. See below for examples of testing times that your health care provider may suggest. Type 1 Diabetes  Test  at least 2 times per day if your diabetes is well controlled, if you are using an insulin pump, or if you perform multiple daily injections.  If your diabetes is not well controlled or if you are sick, you may need to test more often.  It is a good idea to also test:  Before every insulin injection.  Before and after exercise.  Between meals and 2 hours after a meal.  Occasionally between 2:00 a.m. and 3:00 a.m. Type 2 Diabetes  If you are taking insulin, test at least 2 times per day. However, it is best to test before every insulin injection.  If you take  medicines by mouth (orally), test 2 times a day.  If you are on a controlled diet, test once a day.  If your diabetes is not well controlled or if you are sick, you may need to monitor more often. HOW TO MONITOR YOUR BLOOD GLUCOSE Supplies Needed  Blood glucose meter.  Test strips for your meter. Each meter has its own strips. You must use the strips that go with your own meter.  A pricking needle (lancet).  A device that holds the lancet (lancing device).  A journal or log book to write down your results. Procedure  Wash your hands with soap and water. Alcohol is not preferred.  Prick the side of your finger (not the tip) with the lancet.  Gently milk the finger until a small drop of blood appears.  Follow the instructions that come with your meter for inserting the test strip, applying blood to the strip, and using your blood glucose meter. Other Areas to Get Blood for Testing Some meters allow you to use other areas of your body (other than your finger) to test your blood. These areas are called alternative sites. The most common alternative sites are:  The forearm.  The thigh.  The back area of the lower leg.  The palm of the hand. The blood flow in these areas is slower. Therefore, the blood glucose values you get may be delayed, and the numbers are different from what you would get from your fingers. Do not use alternative sites if you think you are having hypoglycemia. Your reading will not be accurate. Always use a finger if you are having hypoglycemia. Also, if you cannot feel your lows (hypoglycemia unawareness), always use your fingers for your blood glucose checks. ADDITIONAL TIPS FOR GLUCOSE MONITORING  Do not reuse lancets.  Always carry your supplies with you.  All blood glucose meters have a 24-hour "hotline" number to call if you have questions or need help.  Adjust (calibrate) your blood glucose meter with a control solution after finishing a few boxes  of strips. BLOOD GLUCOSE RECORD KEEPING It is a good idea to keep a daily record or log of your blood glucose readings. Most glucose meters, if not all, keep your glucose records stored in the meter. Some meters come with the ability to download your records to your home computer. Keeping a record of your blood glucose readings is especially helpful if you are wanting to look for patterns. Make notes to go along with the blood glucose readings because you might forget what happened at that exact time. Keeping good records helps you and your health care provider to work together to achieve good diabetes management.    This information is not intended to replace advice given to you by your health care provider. Make sure you discuss any questions you have with  your health care provider.   Document Released: 12/11/2003 Document Revised: 12/29/2014 Document Reviewed: 05/02/2013 Elsevier Interactive Patient Education [employer].

## 2015-10-10 NOTE — Patient Instructions (Signed)
Diabetes Mellitus and Food It is important for you to manage your blood sugar (glucose) level. Your blood glucose level can be greatly affected by what you eat. Eating healthier foods in the appropriate amounts throughout the day at about the same time each day will help you control your blood glucose level. It can also help slow or prevent worsening of your diabetes mellitus. Healthy eating may even help you improve the level of your blood pressure and reach or maintain a healthy weight.  General recommendations for healthful eating and cooking habits include:  Eating meals and snacks regularly. Avoid going long periods of time without eating to lose weight.  Eating a diet that consists mainly of plant-based foods, such as fruits, vegetables, nuts, legumes, and whole grains.  Using low-heat cooking methods, such as baking, instead of high-heat cooking methods, such as deep frying. Work with your dietitian to make sure you understand how to use the Nutrition Facts information on food labels. HOW CAN FOOD AFFECT ME? Carbohydrates Carbohydrates affect your blood glucose level more than any other type of food. Your dietitian will help you determine how many carbohydrates to eat at each meal and teach you how to count carbohydrates. Counting carbohydrates is important to keep your blood glucose at a healthy level, especially if you are using insulin or taking certain medicines for diabetes mellitus. Alcohol Alcohol can cause sudden decreases in blood glucose (hypoglycemia), especially if you use insulin or take certain medicines for diabetes mellitus. Hypoglycemia can be a life-threatening condition. Symptoms of hypoglycemia (sleepiness, dizziness, and disorientation) are similar to symptoms of having too much alcohol.  If your health care provider has given you approval to drink alcohol, do so in moderation and use the following guidelines:  Women should not have more than one drink per day, and men  should not have more than two drinks per day. One drink is equal to:  12 oz of beer.  5 oz of wine.  1 oz of hard liquor.  Do not drink on an empty stomach.  Keep yourself hydrated. Have water, diet soda, or unsweetened iced tea.  Regular soda, juice, and other mixers might contain a lot of carbohydrates and should be counted. WHAT FOODS ARE NOT RECOMMENDED? As you make food choices, it is important to remember that all foods are not the same. Some foods have fewer nutrients per serving than other foods, even though they might have the same number of calories or carbohydrates. It is difficult to get your body what it needs when you eat foods with fewer nutrients. Examples of foods that you should avoid that are high in calories and carbohydrates but low in nutrients include:  Trans fats (most processed foods list trans fats on the Nutrition Facts label).  Regular soda.  Juice.  Candy.  Sweets, such as cake, pie, doughnuts, and cookies.  Fried foods. WHAT FOODS CAN I EAT? Eat nutrient-rich foods, which will nourish your body and keep you healthy. The food you should eat also will depend on several factors, including:  The calories you need.  The medicines you take.  Your weight.  Your blood glucose level.  Your blood pressure level.  Your cholesterol level. You should eat a variety of foods, including:  Protein.  Lean cuts of meat.  Proteins low in saturated fats, such as fish, egg whites, and beans. Avoid processed meats.  Fruits and vegetables.  Fruits and vegetables that may help control blood glucose levels, such as apples, mangoes, and   yams.  Dairy products.  Choose fat-free or low-fat dairy products, such as milk, yogurt, and cheese.  Grains, bread, pasta, and rice.  Choose whole grain products, such as multigrain bread, whole oats, and brown rice. These foods may help control blood pressure.  Fats.  Foods containing healthful fats, such as nuts,  avocado, olive oil, canola oil, and fish. DOES EVERYONE WITH DIABETES MELLITUS HAVE THE SAME MEAL PLAN? Because every person with diabetes mellitus is different, there is not one meal plan that works for everyone. It is very important that you meet with a dietitian who will help you create a meal plan that is just right for you.   This information is not intended to replace advice given to you by your health care provider. Make sure you discuss any questions you have with your health care provider.   Document Released: 09/04/2005 Document Revised: 12/29/2014 Document Reviewed: 11/04/2013 Elsevier Interactive Patient Education 2016 Elsevier Inc.  

## 2015-10-10 NOTE — Progress Notes (Signed)
S:    Patient arrives for a visit with Dr. Jarold Song after a recent hospitalization. She is referred to me for diabetes education.   Patient reports adherence with medications. Current diabetes medications include glipizide 2.5 mg daily.   Patient denies hypoglycemic events.  Patient reported dietary habits: reports that her daughters have been working with her to change her eating habits. They are removing the sodium from her house and she is trying to eat more baked chicken and [place] sausage.   Patient reported exercise habits: denies  She reports that she has members of her family and friends who have diabetes.   O:  Lab Results  Component Value Date   HGBA1C 7.8* 09/29/2015    A/P: Diabetes newly diagnosed currently uncontrolled based on A1c of 7.8.   Patient denies hypoglycemic events and is able to verbalize appropriate hypoglycemia management plan.  Patient reports adherence with medication. Control is suboptimal due to dietary indiscretion and sedentary lifestyle.  Focused on diabetic education for this visit. Discussed what A1c is and why it is important, as well as the consequences of uncontrolled diabetes, such as heart disease, kidney disease, etc. Also discussed lifestyle modifications such as decrease in carb intake, meal planning, and plate method. Also discussed hypoglycemia as patient is at risk for hypoglycemia while on glipizide. Lastly, discussed the use of the blood glucose meter and monitoring blood glucose at home. Patient verbalized understanding of all of the education received and said that she would take it home and share it with her daughters.   Next A1C anticipated January 2017.    Written patient instructions provided and patient was provided educational papers on all of the topics discussed.  Total time in face to face counseling 20 minutes.   Follow up in Pharmacist Clinic Visit as needed.

## 2015-10-10 NOTE — Progress Notes (Signed)
Pt's here for hospital, f/up for chest pain an complain that she's still have pain.   Pt requesting a note from 10/11-10/19. Pt states she having tightness in chest and muscle spasm in lower rates pain at 7.   Pt reports pain in chest and back been hurting since Hospital d/c.  Pt states she hasn't taken her meds today.

## 2015-10-11 ENCOUNTER — Telehealth: Payer: Self-pay

## 2015-10-11 LAB — MICROALBUMIN / CREATININE URINE RATIO
Creatinine, Urine: 185 mg/dL (ref 20–320)
Microalb Creat Ratio: 4 mcg/mg creat (ref <30)
Microalb, Ur: 0.8 mg/dL

## 2015-10-11 NOTE — Telephone Encounter (Signed)
-----   Message from Arnoldo Morale, MD sent at 10/11/2015  9:25 AM EDT ----- Please inform the patient that labs are normal. Thank you.

## 2015-10-11 NOTE — Telephone Encounter (Signed)
CMA called pt, pt was not available. Left a message for pt to return call ASAP.

## 2015-10-14 ENCOUNTER — Encounter (HOSPITAL_COMMUNITY): Payer: Self-pay | Admitting: Emergency Medicine

## 2015-10-14 ENCOUNTER — Emergency Department (HOSPITAL_COMMUNITY)
Admission: EM | Admit: 2015-10-14 | Discharge: 2015-10-14 | Disposition: A | Discharge status: Home / Self Care | Payer: Self-pay | Attending: Emergency Medicine | Admitting: Emergency Medicine

## 2015-10-14 DIAGNOSIS — Z7952 Long term (current) use of systemic steroids: Secondary | ICD-10-CM | POA: Insufficient documentation

## 2015-10-14 DIAGNOSIS — Z79899 Other long term (current) drug therapy: Secondary | ICD-10-CM | POA: Insufficient documentation

## 2015-10-14 DIAGNOSIS — Z7951 Long term (current) use of inhaled steroids: Secondary | ICD-10-CM | POA: Insufficient documentation

## 2015-10-14 DIAGNOSIS — I10 Essential (primary) hypertension: Secondary | ICD-10-CM | POA: Insufficient documentation

## 2015-10-14 DIAGNOSIS — M67432 Ganglion, left wrist: Secondary | ICD-10-CM | POA: Insufficient documentation

## 2015-10-14 DIAGNOSIS — J45909 Unspecified asthma, uncomplicated: Secondary | ICD-10-CM | POA: Insufficient documentation

## 2015-10-14 MED ORDER — IBUPROFEN 800 MG PO TABS: 800.0000 mg | ORAL_TABLET | Freq: Three times a day (TID) | ORAL | Status: DC

## 2015-10-14 NOTE — ED Provider Notes (Signed)
CSN: [id number]     Arrival date & time 10/14/15  2232 History   First MD Initiated Contact with Patient 10/14/15 2241     Chief Complaint  Patient presents with  . Wrist Pain     (Consider location/radiation/quality/duration/timing/severity/associated sxs/prior Treatment) Patient is a 49 y.o. female presenting with wrist pain. The history is provided by the patient. No language interpreter was used.  Wrist Pain This is a new problem. The current episode started in the past 7 days. Pertinent negatives include no chills, fever or numbness. Associated symptoms comments: Complains of painful swollen area to left wrist that appeared without known injury over the last 4 days. No pain in the hand or proximal forearm. No history of wrist problems. .    Past Medical History  Diagnosis Date  . Asthma   . Hypertension    Past Surgical History  Procedure Laterality Date  . Abdominal hysterectomy    . Tubal ligation     No family history on file. Social History  Substance Use Topics  . Smoking status: Never Smoker   . Smokeless tobacco: None  . Alcohol Use: No   OB History    No data available     Review of Systems  Constitutional: Negative for fever and chills.  Musculoskeletal:       See HPI.  Skin: Negative.  Negative for color change and wound.  Neurological: Negative.  Negative for numbness.      Allergies  Review of patient's allergies indicates no known allergies.  Home Medications   Prior to Admission medications   Medication Sig Start Date End Date Taking? Authorizing Provider  acetaminophen (TYLENOL) 500 MG tablet Take 1,000 mg by mouth every 6 (six) hours as needed (for pain.).     Historical Provider, MD  albuterol (PROVENTIL HFA;VENTOLIN HFA) 108 (90 BASE) MCG/ACT inhaler Inhale 1-2 puffs into the lungs every 6 (six) hours as needed for wheezing or shortness of breath. 09/22/15   Debby Freiberg, MD  albuterol-ipratropium (COMBIVENT) [phone] MCG/ACT inhaler  Inhale 1-2 puffs into the lungs every 4 (four) hours as needed for wheezing or shortness of breath. 09/30/15   Robbie Lis, MD  beclomethasone (QVAR) 80 MCG/ACT inhaler Inhale 1 puff into the lungs 2 (two) times daily. 10/10/15   Arnoldo Morale, MD  Blood Glucose Monitoring Suppl (TRUE METRIX METER) DEVI 1 each by Does not apply route 2 (two) times daily at 8 am and 10 pm. 10/10/15   Arnoldo Morale, MD  cyclobenzaprine (FLEXERIL) 10 MG tablet Take 1 tablet (10 mg total) by mouth 3 (three) times daily as needed for muscle spasms. 10/10/15   Arnoldo Morale, MD  glipiZIDE (GLUCOTROL) 5 MG tablet Take 0.5 tablets (2.5 mg total) by mouth daily before breakfast. 10/02/15   Robbie Lis, MD  glucose blood (TRUE METRIX BLOOD GLUCOSE TEST) test strip Use as instructed 10/10/15   Arnoldo Morale, MD  oxyCODONE-acetaminophen (PERCOCET/ROXICET) 5-325 MG tablet Take 1 tablet by mouth every 4 (four) hours as needed for moderate pain. 10/02/15   Robbie Lis, MD  predniSONE (DELTASONE) 50 MG tablet Take 1 tablet (50 mg total) by mouth daily with breakfast. 09/30/15   Robbie Lis, MD  TRUEPLUS LANCETS 28G MISC 1 each by Does not apply route 2 (two) times daily at 8 am and 10 pm. 10/10/15   Arnoldo Morale, MD   BP 138/99 mmHg  Pulse 96  Temp(Src) 98.2 F (36.8 C) (Oral)  Resp 16  Ht 5'  1" (1.549 m)  Wt 187 lb (84.823 kg)  BMI 35.35 kg/m2  SpO2 99% Physical Exam  Constitutional: She is oriented to person, place, and time. She appears well-developed and well-nourished.  Neck: Normal range of motion.  Pulmonary/Chest: Effort normal.  Musculoskeletal:  Left wrist has a cystic area of swelling that is firm, mobile, tender, without redness c/w ganglion cyst. Cyst is located on volar and ulnar wrist.  Neurological: She is alert and oriented to person, place, and time.  Skin: Skin is warm and dry.    ED Course  Procedures (including critical care time) Labs Review Labs Reviewed - No data to display  Imaging  Review No results found. I have personally reviewed and evaluated these images and lab results as part of my medical decision-making.   EKG Interpretation None      MDM   Final diagnoses:  None    1. Ganglion cyst left wrist  Uncomplicated ganglion cyst left wrist requiring supportive care. Will refer to hand ortho for treatment if pain continues.     Charlann Lange, PA-C 10/14/15 3276  Nat Christen, MD 10/14/15 [phone]

## 2015-10-14 NOTE — Discharge Instructions (Signed)
Ganglion Cyst A ganglion cyst is a noncancerous, fluid-filled lump that occurs near joints or tendons. The ganglion cyst grows out of a joint or the lining of a tendon. It most often develops in the hand or wrist, but it can also develop in the shoulder, elbow, hip, knee, ankle, or foot. The round or oval ganglion cyst can be the size of a pea or larger than a grape. Increased activity may enlarge the size of the cyst because more fluid starts to build up.  CAUSES It is not known what causes a ganglion cyst to grow. However, it may be related to:  Inflammation or irritation around the joint.  An injury.  Repetitive movements or overuse.  Arthritis. RISK FACTORS Risk factors include:  Being a woman.  Being age 57-50. SIGNS AND SYMPTOMS Symptoms may include:   A lump. This most often appears on the hand or wrist, but it can occur in other areas of the body.  Tingling.  Pain.  Numbness.  Muscle weakness.  Weak grip.  Less movement in a joint. DIAGNOSIS Ganglion cysts are most often diagnosed based on a physical exam. Your health care provider will feel the lump and may shine a light alongside it. If it is a ganglion cyst, a light often shines through it. Your health care provider may order an X-ray, ultrasound, or MRI to rule out other conditions. TREATMENT Ganglion cysts usually go away on their own without treatment. If pain or other symptoms are involved, treatment may be needed. Treatment is also needed if the ganglion cyst limits your movement or if it gets infected. Treatment may include:  Wearing a brace or splint on your wrist or finger.  Taking anti-inflammatory medicine.  Draining fluid from the lump with a needle (aspiration).  Injecting a steroid into the joint.  Surgery to remove the ganglion cyst. HOME CARE INSTRUCTIONS  Do not press on the ganglion cyst, poke it with a needle, or hit it.  Take medicines only as directed by your health care  provider.  Wear your brace or splint as directed by your health care provider.  Watch your ganglion cyst for any changes.  Keep all follow-up visits as directed by your health care provider. This is important. SEEK MEDICAL CARE IF:  Your ganglion cyst becomes larger or more painful.  You have increased redness, red streaks, or swelling.  You have pus coming from the lump.  You have weakness or numbness in the affected area.  You have a fever or chills.   This information is not intended to replace advice given to you by your health care provider. Make sure you discuss any questions you have with your health care provider.   Document Released: 12/05/2000 Document Revised: 12/29/2014 Document Reviewed: 05/23/2014 Elsevier Interactive Patient Education 2016 [place] therapy can help ease sore, stiff, injured, and tight muscles and joints. Heat relaxes your muscles, which may help ease your pain.  RISKS AND COMPLICATIONS If you have any of the following conditions, do not use heat therapy unless your health care provider has approved:  Poor circulation.  Healing wounds or scarred skin in the area being treated.  Diabetes, heart disease, or high blood pressure.  Not being able to feel (numbness) the area being treated.  Unusual swelling of the area being treated.  Active infections.  Blood clots.  Cancer.  Inability to communicate pain. This may include young children and people who have problems with their brain function (dementia).  Pregnancy. Heat therapy should only be used on old, pre-existing, or long-lasting (chronic) injuries. Do not use heat therapy on new injuries unless directed by your health care provider. HOW TO USE HEAT THERAPY There are several different kinds of heat therapy, including:  Moist heat pack.  Warm water bath.  Hot water bottle.  Electric heating pad.  Heated gel pack.  Heated wrap.  Electric heating  pad. Use the heat therapy method suggested by your health care provider. Follow your health care provider's instructions on when and how to use heat therapy. GENERAL HEAT THERAPY RECOMMENDATIONS  Do not sleep while using heat therapy. Only use heat therapy while you are awake.  Your skin may turn pink while using heat therapy. Do not use heat therapy if your skin turns red.  Do not use heat therapy if you have new pain.  High heat or long exposure to heat can cause burns. Be careful when using heat therapy to avoid burning your skin.  Do not use heat therapy on areas of your skin that are already irritated, such as with a rash or sunburn. SEEK MEDICAL CARE IF:  You have blisters, redness, swelling, or numbness.  You have new pain.  Your pain is worse. MAKE SURE YOU:  Understand these instructions.  Will watch your condition.  Will get help right away if you are not doing well or get worse.   This information is not intended to replace advice given to you by your health care provider. Make sure you discuss any questions you have with your health care provider.   Document Released: 03/01/2012 Document Revised: 12/29/2014 Document Reviewed: 01/31/2014 Elsevier Interactive Patient Education [employer].

## 2015-10-14 NOTE — ED Notes (Signed)
Pt from home states she has a knot on her left wrist that showed up on Thursday and began hurting Friday night. Pt has pulses in both wrists and is able to move her fingers. Pt reports no known injury to the wrist

## 2015-10-24 ENCOUNTER — Ambulatory Visit: Payer: Self-pay | Admitting: Family Medicine

## 2015-10-25 ENCOUNTER — Ambulatory Visit (HOSPITAL_BASED_OUTPATIENT_CLINIC_OR_DEPARTMENT_OTHER): Payer: Worker's Compensation | Admitting: Pharmacist

## 2015-10-25 ENCOUNTER — Encounter: Payer: Self-pay | Admitting: Family Medicine

## 2015-10-25 ENCOUNTER — Ambulatory Visit: Payer: Self-pay | Attending: Family Medicine | Admitting: Family Medicine

## 2015-10-25 VITALS — BP 136/98 | HR 81 | Temp 98.3°F | Resp 16 | Ht 61.5 in | Wt 189.0 lb

## 2015-10-25 DIAGNOSIS — J45909 Unspecified asthma, uncomplicated: Secondary | ICD-10-CM | POA: Insufficient documentation

## 2015-10-25 DIAGNOSIS — R0789 Other chest pain: Secondary | ICD-10-CM | POA: Insufficient documentation

## 2015-10-25 DIAGNOSIS — Z7984 Long term (current) use of oral hypoglycemic drugs: Secondary | ICD-10-CM | POA: Insufficient documentation

## 2015-10-25 DIAGNOSIS — Z23 Encounter for immunization: Secondary | ICD-10-CM | POA: Insufficient documentation

## 2015-10-25 DIAGNOSIS — Z79899 Other long term (current) drug therapy: Secondary | ICD-10-CM | POA: Insufficient documentation

## 2015-10-25 DIAGNOSIS — I10 Essential (primary) hypertension: Secondary | ICD-10-CM | POA: Insufficient documentation

## 2015-10-25 DIAGNOSIS — E119 Type 2 diabetes mellitus without complications: Secondary | ICD-10-CM

## 2015-10-25 DIAGNOSIS — J4520 Mild intermittent asthma, uncomplicated: Secondary | ICD-10-CM

## 2015-10-25 DIAGNOSIS — M545 Low back pain, unspecified: Secondary | ICD-10-CM

## 2015-10-25 LAB — GLUCOSE, POCT (MANUAL RESULT ENTRY): POC Glucose: 158 mg/dl — AB (ref 70–99)

## 2015-10-25 MED ORDER — TRAMADOL HCL 50 MG PO TABS: 50.0000 mg | ORAL_TABLET | Freq: Three times a day (TID) | ORAL | Status: DC | PRN

## 2015-10-25 MED ORDER — CYCLOBENZAPRINE HCL 10 MG PO TABS: 10.0000 mg | ORAL_TABLET | Freq: Three times a day (TID) | ORAL | Status: DC | PRN
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
## 2015-10-25 NOTE — Telephone Encounter (Signed)
-----   Message from Arnoldo Morale, MD sent at 10/11/2015  9:25 AM EDT ----- Please inform the patient that labs are normal. Thank you.

## 2015-10-25 NOTE — Patient Instructions (Signed)
When you first wake up and before you eat - your blood sugar goal is 90 - 120  2 hours after you eat, your blood sugar goal is <180  Anything less than 80 is too low.    Blood Glucose Monitoring, Adult Monitoring your blood glucose (also know as blood sugar) helps you to manage your diabetes. It also helps you and your health care provider monitor your diabetes and determine how well your treatment plan is working. WHY SHOULD YOU MONITOR YOUR BLOOD GLUCOSE?  It can help you understand how food, exercise, and medicine affect your blood glucose.  It allows you to know what your blood glucose is at any given moment. You can quickly tell if you are having low blood glucose (hypoglycemia) or high blood glucose (hyperglycemia).  It can help you and your health care provider know how to adjust your medicines.  It can help you understand how to manage an illness or adjust medicine for exercise. WHEN SHOULD YOU TEST? Your health care provider will help you decide how often you should check your blood glucose. This may depend on the type of diabetes you have, your diabetes control, or the types of medicines you are taking. Be sure to write down all of your blood glucose readings so that this information can be reviewed with your health care provider. See below for examples of testing times that your health care provider may suggest. Type 1 Diabetes  Test at least 2 times per day if your diabetes is well controlled, if you are using an insulin pump, or if you perform multiple daily injections.  If your diabetes is not well controlled or if you are sick, you may need to test more often.  It is a good idea to also test:  Before every insulin injection.  Before and after exercise.  Between meals and 2 hours after a meal.  Occasionally between 2:00 a.m. and 3:00 a.m. Type 2 Diabetes  If you are taking insulin, test at least 2 times per day. However, it is best to test before every insulin  injection.  If you take medicines by mouth (orally), test 2 times a day.  If you are on a controlled diet, test once a day.  If your diabetes is not well controlled or if you are sick, you may need to monitor more often. HOW TO MONITOR YOUR BLOOD GLUCOSE Supplies Needed  Blood glucose meter.  Test strips for your meter. Each meter has its own strips. You must use the strips that go with your own meter.  A pricking needle (lancet).  A device that holds the lancet (lancing device).  A journal or log book to write down your results. Procedure  Wash your hands with soap and water. Alcohol is not preferred.  Prick the side of your finger (not the tip) with the lancet.  Gently milk the finger until a small drop of blood appears.  Follow the instructions that come with your meter for inserting the test strip, applying blood to the strip, and using your blood glucose meter. Other Areas to Get Blood for Testing Some meters allow you to use other areas of your body (other than your finger) to test your blood. These areas are called alternative sites. The most common alternative sites are:  The forearm.  The thigh.  The back area of the lower leg.  The palm of the hand. The blood flow in these areas is slower. Therefore, the blood glucose values you get  may be delayed, and the numbers are different from what you would get from your fingers. Do not use alternative sites if you think you are having hypoglycemia. Your reading will not be accurate. Always use a finger if you are having hypoglycemia. Also, if you cannot feel your lows (hypoglycemia unawareness), always use your fingers for your blood glucose checks. ADDITIONAL TIPS FOR GLUCOSE MONITORING  Do not reuse lancets.  Always carry your supplies with you.  All blood glucose meters have a 24-hour "hotline" number to call if you have questions or need help.  Adjust (calibrate) your blood glucose meter with a control solution  after finishing a few boxes of strips. BLOOD GLUCOSE RECORD KEEPING It is a good idea to keep a daily record or log of your blood glucose readings. Most glucose meters, if not all, keep your glucose records stored in the meter. Some meters come with the ability to download your records to your home computer. Keeping a record of your blood glucose readings is especially helpful if you are wanting to look for patterns. Make notes to go along with the blood glucose readings because you might forget what happened at that exact time. Keeping good records helps you and your health care provider to work together to achieve good diabetes management.    This information is not intended to replace advice given to you by your health care provider. Make sure you discuss any questions you have with your health care provider.   Document Released: 12/11/2003 Document Revised: 12/29/2014 Document Reviewed: 05/02/2013 Elsevier Interactive Patient Education [employer].

## 2015-10-25 NOTE — Telephone Encounter (Signed)
Pt was seen in our clinic today and given her results. Pt verbalized that she understood with no further questions.

## 2015-10-25 NOTE — Progress Notes (Signed)
Pt's here for DM f/up. Pt reports feeling pain in chest or back. Pain rated at 7/10.  Pt reports taking meds except her BP.  Pt needs refill of Prednisone and requesting Tramodal.

## 2015-10-25 NOTE — Progress Notes (Signed)
S:    Patient arrives for a visit with Dr. Jarold Song.  I was asked to provided education on blood glucose meter use.   Patient has the True Metrix meter.  O:  Lab Results  Component Value Date   HGBA1C 7.8* 09/29/2015    A/P: Diabetes currently uncontrolled based on A1c of 7.8.   Patient denies hypoglycemic events and is able to verbalize appropriate hypoglycemia management plan.  Patient reports adherence with medication.   Educated patient on the True Metrix meter and how to check blood glucose. Patient was able to demonstrate use of the meter. Reviewed goal blood glucose readings and when she should be checking her blood glucose.Marland Kitchen Also reviewed hypoglycemia and how to treat it. Patient verbalized understanding.   Next A1C anticipated January 2017.    Written patient instructions provided.  Total time in face to face counseling 20 minutes.   Follow up in Pharmacist Clinic Visit as needed.

## 2015-10-25 NOTE — Patient Instructions (Signed)

## 2015-11-26 ENCOUNTER — Ambulatory Visit: Payer: Self-pay | Attending: Family Medicine | Admitting: Family Medicine

## 2015-11-26 ENCOUNTER — Encounter: Payer: Self-pay | Admitting: Family Medicine

## 2015-11-26 VITALS — BP 124/93 | HR 88 | Temp 98.2°F | Resp 14 | Ht 61.5 in | Wt 188.2 lb

## 2015-11-26 DIAGNOSIS — J0140 Acute pansinusitis, unspecified: Secondary | ICD-10-CM | POA: Insufficient documentation

## 2015-11-26 DIAGNOSIS — J019 Acute sinusitis, unspecified: Secondary | ICD-10-CM | POA: Insufficient documentation

## 2015-11-26 DIAGNOSIS — R079 Chest pain, unspecified: Secondary | ICD-10-CM | POA: Insufficient documentation

## 2015-11-26 DIAGNOSIS — E1165 Type 2 diabetes mellitus with hyperglycemia: Secondary | ICD-10-CM | POA: Insufficient documentation

## 2015-11-26 DIAGNOSIS — J45909 Unspecified asthma, uncomplicated: Secondary | ICD-10-CM | POA: Insufficient documentation

## 2015-11-26 DIAGNOSIS — M545 Low back pain: Secondary | ICD-10-CM | POA: Insufficient documentation

## 2015-11-26 DIAGNOSIS — Z79899 Other long term (current) drug therapy: Secondary | ICD-10-CM | POA: Insufficient documentation

## 2015-11-26 DIAGNOSIS — M722 Plantar fascial fibromatosis: Secondary | ICD-10-CM | POA: Insufficient documentation

## 2015-11-26 DIAGNOSIS — I10 Essential (primary) hypertension: Secondary | ICD-10-CM | POA: Insufficient documentation

## 2015-11-26 DIAGNOSIS — R0982 Postnasal drip: Secondary | ICD-10-CM | POA: Insufficient documentation

## 2015-11-26 DIAGNOSIS — J029 Acute pharyngitis, unspecified: Secondary | ICD-10-CM | POA: Insufficient documentation

## 2015-11-26 DIAGNOSIS — J4520 Mild intermittent asthma, uncomplicated: Secondary | ICD-10-CM | POA: Insufficient documentation

## 2015-11-26 LAB — GLUCOSE, POCT (MANUAL RESULT ENTRY): POC GLUCOSE: 165 mg/dL — AB (ref 70–99)

## 2015-11-26 MED ORDER — IBUPROFEN 600 MG PO TABS: 600.0000 mg | ORAL_TABLET | Freq: Three times a day (TID) | ORAL | Status: DC | PRN

## 2015-11-26 MED ORDER — AMOXICILLIN 500 MG PO CAPS: 500.0000 mg | ORAL_CAPSULE | Freq: Three times a day (TID) | ORAL | Status: DC

## 2015-11-26 MED ORDER — LISINOPRIL 2.5 MG PO TABS: 2.5000 mg | ORAL_TABLET | Freq: Every day | ORAL | Status: DC

## 2015-11-26 MED ORDER — TRAMADOL HCL 50 MG PO TABS: 50.0000 mg | ORAL_TABLET | Freq: Three times a day (TID) | ORAL | Status: DC | PRN

## 2015-11-26 NOTE — Progress Notes (Signed)
Patient here to f/u on her diabetes She states she has not been using her glucometer and gives no real reason as to why She reports feeling ill today with cough,runny eyes,nose She is asking for refills on medications

## 2015-11-26 NOTE — Patient Instructions (Signed)
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
## 2015-12-27 ENCOUNTER — Telehealth: Payer: Self-pay | Admitting: Family Medicine

## 2015-12-27 NOTE — Telephone Encounter (Signed)
Pt. Called stating she needs an  Updated letter where it states the she was out of work from when she was last since at [facility] until now. Pt. Stated she needs the letter for her attorney. Please f/u with pt.
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
## 2015-12-28 NOTE — Telephone Encounter (Signed)
Patient called and date of birth verified.  Patient would like new letter writing her out of work.  Explained to patient that she would need to be re-seen by MD for assessment.  Patient states she has appointment scheduled for the 16th.  RN told her she could speak to the MD then.

## 2016-01-07 ENCOUNTER — Ambulatory Visit: Payer: Self-pay | Attending: Family Medicine | Admitting: Family Medicine

## 2016-01-07 ENCOUNTER — Encounter: Payer: Self-pay | Admitting: Family Medicine

## 2016-01-07 VITALS — BP 134/86 | HR 75 | Temp 97.7°F | Resp 13 | Ht 61.5 in | Wt 183.5 lb

## 2016-01-07 DIAGNOSIS — M545 Low back pain, unspecified: Secondary | ICD-10-CM

## 2016-01-07 DIAGNOSIS — Z9119 Patient's noncompliance with other medical treatment and regimen: Secondary | ICD-10-CM | POA: Insufficient documentation

## 2016-01-07 DIAGNOSIS — E1140 Type 2 diabetes mellitus with diabetic neuropathy, unspecified: Secondary | ICD-10-CM | POA: Insufficient documentation

## 2016-01-07 DIAGNOSIS — Z6834 Body mass index (BMI) 34.0-34.9, adult: Secondary | ICD-10-CM | POA: Insufficient documentation

## 2016-01-07 DIAGNOSIS — E1165 Type 2 diabetes mellitus with hyperglycemia: Secondary | ICD-10-CM

## 2016-01-07 DIAGNOSIS — J4520 Mild intermittent asthma, uncomplicated: Secondary | ICD-10-CM

## 2016-01-07 DIAGNOSIS — Z79899 Other long term (current) drug therapy: Secondary | ICD-10-CM | POA: Insufficient documentation

## 2016-01-07 DIAGNOSIS — M722 Plantar fascial fibromatosis: Secondary | ICD-10-CM

## 2016-01-07 DIAGNOSIS — E1149 Type 2 diabetes mellitus with other diabetic neurological complication: Secondary | ICD-10-CM

## 2016-01-07 DIAGNOSIS — E669 Obesity, unspecified: Secondary | ICD-10-CM | POA: Insufficient documentation

## 2016-01-07 LAB — POCT GLYCOSYLATED HEMOGLOBIN (HGB A1C): Hemoglobin A1C: 7.7

## 2016-01-07 LAB — GLUCOSE, POCT (MANUAL RESULT ENTRY): POC GLUCOSE: 149 mg/dL — AB (ref 70–99)

## 2016-01-07 MED ORDER — TRAMADOL HCL 50 MG PO TABS: 50.0000 mg | ORAL_TABLET | Freq: Three times a day (TID) | ORAL | Status: DC | PRN

## 2016-01-07 MED ORDER — IBUPROFEN 600 MG PO TABS: 600.0000 mg | ORAL_TABLET | Freq: Three times a day (TID) | ORAL | Status: DC | PRN

## 2016-01-07 MED ORDER — ALBUTEROL SULFATE HFA 108 (90 BASE) MCG/ACT IN AERS: 1.0000 | INHALATION_SPRAY | Freq: Four times a day (QID) | RESPIRATORY_TRACT | Status: DC | PRN

## 2016-01-07 MED ORDER — LISINOPRIL 2.5 MG PO TABS: 2.5000 mg | ORAL_TABLET | Freq: Every day | ORAL | Status: DC

## 2016-01-07 MED ORDER — GABAPENTIN 300 MG PO CAPS: 300.0000 mg | ORAL_CAPSULE | Freq: Two times a day (BID) | ORAL | Status: DC

## 2016-01-07 MED ORDER — BECLOMETHASONE DIPROPIONATE 80 MCG/ACT IN AERS: 1.0000 | INHALATION_SPRAY | Freq: Two times a day (BID) | RESPIRATORY_TRACT | Status: DC

## 2016-01-07 MED ORDER — GLIPIZIDE 5 MG PO TABS: 2.5000 mg | ORAL_TABLET | Freq: Every day | ORAL | Status: DC

## 2016-01-07 MED FILL — GABAPENTIN 300 MG CAPSULE: 300 | 30 days supply | Qty: 60 | Fill #0

## 2016-01-07 MED FILL — IBUPROFEN 600 MG TABLET: 600 | 10 days supply | Qty: 30 | Fill #0

## 2016-01-07 MED FILL — LISINOPRIL 2.5 MG TABLET: 2.5 | 30 days supply | Qty: 30 | Fill #0

## 2016-01-07 MED FILL — !QVAR 80 MCG ORAL INHALER: 80 MCG | 60 days supply | Qty: 1 | Fill #0

## 2016-01-07 MED FILL — ?GLIPIZIDE 5MG TABLET: 5 | 30 days supply | Qty: 15 | Fill #0

## 2016-01-07 MED FILL — traMADol HCL 50 MG TABS: 50 | 10 days supply | Qty: 30 | Fill #0

## 2016-01-07 MED FILL — VENTOLIN HFA 90 MCG INHALER: 108 (90 BAS | 25 days supply | Qty: 18 | Fill #0

## 2016-01-07 NOTE — Patient Instructions (Signed)
Diabetes Mellitus and Food It is important for you to manage your blood sugar (glucose) level. Your blood glucose level can be greatly affected by what you eat. Eating healthier foods in the appropriate amounts throughout the day at about the same time each day will help you control your blood glucose level. It can also help slow or prevent worsening of your diabetes mellitus. Healthy eating may even help you improve the level of your blood pressure and reach or maintain a healthy weight.  General recommendations for healthful eating and cooking habits include:  Eating meals and snacks regularly. Avoid going long periods of time without eating to lose weight.  Eating a diet that consists mainly of plant-based foods, such as fruits, vegetables, nuts, legumes, and whole grains.  Using low-heat cooking methods, such as baking, instead of high-heat cooking methods, such as deep frying. Work with your dietitian to make sure you understand how to use the Nutrition Facts information on food labels. HOW CAN FOOD AFFECT ME? Carbohydrates Carbohydrates affect your blood glucose level more than any other type of food. Your dietitian will help you determine how many carbohydrates to eat at each meal and teach you how to count carbohydrates. Counting carbohydrates is important to keep your blood glucose at a healthy level, especially if you are using insulin or taking certain medicines for diabetes mellitus. Alcohol Alcohol can cause sudden decreases in blood glucose (hypoglycemia), especially if you use insulin or take certain medicines for diabetes mellitus. Hypoglycemia can be a life-threatening condition. Symptoms of hypoglycemia (sleepiness, dizziness, and disorientation) are similar to symptoms of having too much alcohol.  If your health care provider has given you approval to drink alcohol, do so in moderation and use the following guidelines:  Women should not have more than one drink per day, and men  should not have more than two drinks per day. One drink is equal to:  12 oz of beer.  5 oz of wine.  1 oz of hard liquor.  Do not drink on an empty stomach.  Keep yourself hydrated. Have water, diet soda, or unsweetened iced tea.  Regular soda, juice, and other mixers might contain a lot of carbohydrates and should be counted. WHAT FOODS ARE NOT RECOMMENDED? As you make food choices, it is important to remember that all foods are not the same. Some foods have fewer nutrients per serving than other foods, even though they might have the same number of calories or carbohydrates. It is difficult to get your body what it needs when you eat foods with fewer nutrients. Examples of foods that you should avoid that are high in calories and carbohydrates but low in nutrients include:  Trans fats (most processed foods list trans fats on the Nutrition Facts label).  Regular soda.  Juice.  Candy.  Sweets, such as cake, pie, doughnuts, and cookies.  Fried foods. WHAT FOODS CAN I EAT? Eat nutrient-rich foods, which will nourish your body and keep you healthy. The food you should eat also will depend on several factors, including:  The calories you need.  The medicines you take.  Your weight.  Your blood glucose level.  Your blood pressure level.  Your cholesterol level. You should eat a variety of foods, including:  Protein.  Lean cuts of meat.  Proteins low in saturated fats, such as fish, egg whites, and beans. Avoid processed meats.  Fruits and vegetables.  Fruits and vegetables that may help control blood glucose levels, such as apples, mangoes, and   yams.  Dairy products.  Choose fat-free or low-fat dairy products, such as milk, yogurt, and cheese.  Grains, bread, pasta, and rice.  Choose whole grain products, such as multigrain bread, whole oats, and brown rice. These foods may help control blood pressure.  Fats.  Foods containing healthful fats, such as nuts,  avocado, olive oil, canola oil, and fish. DOES EVERYONE WITH DIABETES MELLITUS HAVE THE SAME MEAL PLAN? Because every person with diabetes mellitus is different, there is not one meal plan that works for everyone. It is very important that you meet with a dietitian who will help you create a meal plan that is just right for you.   This information is not intended to replace advice given to you by your health care provider. Make sure you discuss any questions you have with your health care provider.   Document Released: 09/04/2005 Document Revised: 12/29/2014 Document Reviewed: 11/04/2013 Elsevier Interactive Patient Education 2016 Elsevier Inc.  
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
## 2016-10-07 ENCOUNTER — Emergency Department (HOSPITAL_COMMUNITY)
Admission: EM | Admit: 2016-10-07 | Discharge: 2016-10-07 | Disposition: A | Discharge status: Home / Self Care | Payer: Self-pay | Attending: Emergency Medicine | Admitting: Emergency Medicine

## 2016-10-07 ENCOUNTER — Emergency Department (HOSPITAL_COMMUNITY): Payer: Self-pay

## 2016-10-07 ENCOUNTER — Encounter (HOSPITAL_COMMUNITY): Payer: Self-pay | Admitting: *Deleted

## 2016-10-07 DIAGNOSIS — M25561 Pain in right knee: Secondary | ICD-10-CM | POA: Insufficient documentation

## 2016-10-07 DIAGNOSIS — Y999 Unspecified external cause status: Secondary | ICD-10-CM | POA: Insufficient documentation

## 2016-10-07 DIAGNOSIS — Y939 Activity, unspecified: Secondary | ICD-10-CM | POA: Insufficient documentation

## 2016-10-07 DIAGNOSIS — J45909 Unspecified asthma, uncomplicated: Secondary | ICD-10-CM | POA: Insufficient documentation

## 2016-10-07 DIAGNOSIS — E119 Type 2 diabetes mellitus without complications: Secondary | ICD-10-CM | POA: Insufficient documentation

## 2016-10-07 DIAGNOSIS — I10 Essential (primary) hypertension: Secondary | ICD-10-CM | POA: Insufficient documentation

## 2016-10-07 DIAGNOSIS — Y929 Unspecified place or not applicable: Secondary | ICD-10-CM | POA: Insufficient documentation

## 2016-10-07 DIAGNOSIS — W1839XA Other fall on same level, initial encounter: Secondary | ICD-10-CM | POA: Insufficient documentation

## 2016-10-07 DIAGNOSIS — H6121 Impacted cerumen, right ear: Secondary | ICD-10-CM | POA: Insufficient documentation

## 2016-10-07 DIAGNOSIS — Z79899 Other long term (current) drug therapy: Secondary | ICD-10-CM | POA: Insufficient documentation

## 2016-10-07 DIAGNOSIS — Z7984 Long term (current) use of oral hypoglycemic drugs: Secondary | ICD-10-CM | POA: Insufficient documentation

## 2016-10-07 MED ORDER — HYDROCODONE-ACETAMINOPHEN 5-325 MG PO TABS: 1.0000 | ORAL_TABLET | Freq: Four times a day (QID) | ORAL | 0 refills | Status: DC | PRN

## 2016-10-07 NOTE — ED Triage Notes (Signed)
Patient fell on Sunday night and injured her right knee.  She states she has chronic weakness in right knee and her knee gave way.  She denies LOC or hitting head.  Patient c/o anterior knee pain.  She has treated with ibuprofen with no relief.  She is ambulatory in triage.

## 2016-10-07 NOTE — ED Provider Notes (Signed)
[facility] DEPT Provider Note   CSN: [id number] Arrival date & time: 10/07/16  1634   By signing my name below, I, Delton Prairie, attest that this documentation has been prepared under the direction and in the presence of Renald Haithcock,PA-C. Electronically Signed: Delton Prairie, ED Scribe. 10/07/16. 8:07 PM.  History   Chief Complaint Chief Complaint  Patient presents with  . Knee Pain    The history is provided by the patient. No language interpreter was used.   HPI Comments:  [name] is a 50 y.o. female who presents to the Emergency Department complaining of R knee pain s/p a fall which occurred 2 days ago. Pt states she fell on cement floor and landed on her R knee in a twisting motion. She has taken 16 ibuprofen pills today with no relief. Pt denies numbness, tingling, syncope, head injury, chest pain, SOB, abdominal pain, nausea, and vomiting. She states she is able to ambulate with pain. Patient also reports of a sensation of feeling like there is a bug in her right ear. Patient reports taking scissors in her ear 2 days ago, which caused her ear to bleed.  Past Medical History:  Diagnosis Date  . Asthma   . Hypertension     Patient Active Problem List   Diagnosis Date Noted  . Diabetic neuropathy ([place]) 01/07/2016  . Plantar fasciitis of left foot 11/26/2015  . Back pain 10/10/2015  . Asthma 09/29/2015  . Essential hypertension 09/29/2015  . Diabetes mellitus ([place]) 09/29/2015  . Chest pain 09/28/2015    Past Surgical History:  Procedure Laterality Date  . ABDOMINAL HYSTERECTOMY    . TUBAL LIGATION      OB History    No data available       Home Medications    Prior to Admission medications   Medication Sig Start Date End Date Taking? Authorizing Provider  albuterol (PROVENTIL HFA;VENTOLIN HFA) 108 (90 Base) MCG/ACT inhaler Inhale 1-2 puffs into the lungs every 6 (six) hours as needed for wheezing or shortness of breath. 01/07/16   Arnoldo Morale, MD    beclomethasone (QVAR) 80 MCG/ACT inhaler Inhale 1 puff into the lungs 2 (two) times daily. 01/07/16   Arnoldo Morale, MD  Blood Glucose Monitoring Suppl (TRUE METRIX METER) DEVI 1 each by Does not apply route 2 (two) times daily at 8 am and 10 pm. 10/10/15   Arnoldo Morale, MD  gabapentin (NEURONTIN) 300 MG capsule Take 1 capsule (300 mg total) by mouth 2 (two) times daily. 01/07/16   Arnoldo Morale, MD  glipiZIDE (GLUCOTROL) 5 MG tablet Take 0.5 tablets (2.5 mg total) by mouth daily before breakfast. 01/07/16   Arnoldo Morale, MD  glucose blood (TRUE METRIX BLOOD GLUCOSE TEST) test strip Use as instructed 10/10/15   Arnoldo Morale, MD  guaiFENesin (MUCINEX) 600 MG 12 hr tablet Take 600 mg by mouth 2 (two) times daily.    Historical Provider, MD  HYDROcodone-acetaminophen (NORCO/VICODIN) 5-325 MG tablet Take 1-2 tablets by mouth every 6 (six) hours as needed. 10/07/16   Frederica Kuster, PA-C  ibuprofen (ADVIL,MOTRIN) 600 MG tablet Take 1 tablet (600 mg total) by mouth every 8 (eight) hours as needed. 01/07/16   Arnoldo Morale, MD  lisinopril (PRINIVIL,ZESTRIL) 2.5 MG tablet Take 1 tablet (2.5 mg total) by mouth daily. 01/07/16   Arnoldo Morale, MD  oxymetazoline (AFRIN) 0.05 % nasal spray Place 1 spray into both nostrils 2 (two) times daily. Patient states she is using this 5-6 times per day  Historical Provider, MD  traMADol (ULTRAM) 50 MG tablet Take 1 tablet (50 mg total) by mouth every 8 (eight) hours as needed. 01/07/16   Arnoldo Morale, MD  TRUEPLUS LANCETS 28G MISC 1 each by Does not apply route 2 (two) times daily at 8 am and 10 pm. 10/10/15   Arnoldo Morale, MD    Family History No family history on file.  Social History Social History  Substance Use Topics  . Smoking status: Never Smoker  . Smokeless tobacco: Never Used  . Alcohol use No     Allergies   Review of patient's allergies indicates no known allergies.   Review of Systems Review of Systems  Constitutional: Negative for chills and  fever.  HENT: Negative for facial swelling and sore throat.   Respiratory: Negative for shortness of breath.   Cardiovascular: Negative for chest pain.  Gastrointestinal: Negative for abdominal pain, nausea and vomiting.  Genitourinary: Negative for dysuria.  Musculoskeletal: Positive for arthralgias and myalgias. Negative for back pain.  Skin: Negative for rash and wound.  Neurological: Negative for syncope and headaches.  Psychiatric/Behavioral: The patient is not nervous/anxious.      Physical Exam Updated Vital Signs BP 134/97 (BP Location: Left Arm)   Pulse 87   Temp 97.9 F (36.6 C) (Oral)   Resp 18   Ht 5' 1.5" (1.562 m)   Wt 89.4 kg   SpO2 99%   BMI 36.62 kg/m   Physical Exam  Constitutional: She appears well-developed and well-nourished. No distress.  HENT:  Head: Normocephalic and atraumatic.  Right Ear: No drainage, swelling or tenderness. No foreign bodies.  Mouth/Throat: Oropharynx is clear and moist. No oropharyngeal exudate.  Cerumen impaction to right ear; portion of the TM visualized after cerumen removal intact; then layer of suspected cerumen remains after attempts at removal  Eyes: Conjunctivae are normal. Pupils are equal, round, and reactive to light. Right eye exhibits no discharge. Left eye exhibits no discharge. No scleral icterus.  Neck: Normal range of motion. Neck supple. No thyromegaly present.  Cardiovascular: Normal rate, regular rhythm and normal heart sounds.  Exam reveals no gallop and no friction rub.   No murmur heard. Pulses:      Dorsalis pedis pulses are 2+ on the right side, and 2+ on the left side.  Pulmonary/Chest: Effort normal and breath sounds normal. No stridor. No respiratory distress. She has no wheezes. She has no rales.  Abdominal: Soft. Bowel sounds are normal. She exhibits no distension. There is no tenderness. There is no rebound and no guarding.  Musculoskeletal: She exhibits no edema.  No LCL laxity but tenderness over  LCL and pain with varus stress. 5/5 strength to LE.  Lymphadenopathy:    She has no cervical adenopathy.  Neurological: She is alert. Coordination normal.  Skin: Skin is warm and dry. No rash noted. She is not diaphoretic. No pallor.  Psychiatric: She has a normal mood and affect.  Nursing note and vitals reviewed.    ED Treatments / Results  DIAGNOSTIC STUDIES:  Oxygen Saturation is 99% on RA, normal by my interpretation.    COORDINATION OF CARE:  8:05 PM Discussed treatment plan with pt at bedside and pt agreed to plan.  Labs (all labs ordered are listed, but only abnormal results are displayed) Labs Reviewed - No data to display  EKG  EKG Interpretation None       Radiology Dg Knee Complete 4 Views Right  Result Date: 10/07/2016 CLINICAL DATA:  Right knee  pain and swelling for 2 days. Status post fall at work. EXAM: RIGHT KNEE - COMPLETE 4+ VIEW COMPARISON:  None. FINDINGS: No evidence of fracture, dislocation, or joint effusion. No evidence of arthropathy or other focal bone abnormality. Soft tissues are unremarkable. IMPRESSION: No fracture, dislocation or arthropathy of the right knee. Electronically Signed   By: Ulyses Jarred M.D.   On: 10/07/2016 17:43    Procedures .Ear Cerumen Removal Date/Time: 10/07/2016 9:40 PM Performed by: Frederica Kuster Authorized by: Frederica Kuster   Consent:    Consent obtained:  Verbal   Consent given by:  Patient Procedure details:    Location:  R ear   Procedure type: irrigation     Procedure type comment:  And currette Post-procedure details:    Inspection:  Macerated skin and bleeding (minor bleeding)   Hearing quality:  Improved   Patient tolerance of procedure:  Tolerated well, no immediate complications Comments:     Only a portion of TM could be seen with remaining cerumen, however portion seen intact and appears normal   (including critical care time)  Medications Ordered in ED Medications - No data to  display   Initial Impression / Assessment and Plan / ED Course  I have reviewed the triage vital signs and the nursing notes.  Pertinent labs & imaging results that were available during my care of the patient were reviewed by me and considered in my medical decision making (see chart for details).  Clinical Course    Patient X-Ray negative for obvious fracture or dislocation. Concern for injury to LCL, however doubt complete tear. Patient placed in knee immobilizer. Pt advised to follow up with orthopedics. Conservative therapy recommended and discussed. Cerumen impaction mostly resolved after cerumen removal with irrigation and partial. Thin layer of cerumen remains, however patient's hearing significant only improved and no longer had the sensation of a pontine are air. ENT follow-up given as needed. Patient will be discharged home & is agreeable with above plan. Returns precautions discussed. Patient vitals stable throughout ED course discharged in satisfactory condition. I discussed patient case with Dr. Leonette Monarch who guided the patient's management and agrees with plan.   Final Clinical Impressions(s) / ED Diagnoses   Final diagnoses:  Acute pain of right knee  Impacted cerumen of right ear    New Prescriptions New Prescriptions   HYDROCODONE-ACETAMINOPHEN (NORCO/VICODIN) 5-325 MG TABLET    Take 1-2 tablets by mouth every 6 (six) hours as needed.  I personally performed the services described in this documentation, which was scribed in my presence. The recorded information has been reviewed and is accurate.    Frederica Kuster, PA-C 10/07/16 2146    Fatima Blank, MD 10/10/16 [phone]

## 2016-10-07 NOTE — Discharge Instructions (Signed)
Medications: Norco  Treatment: Take 1-2 Norco every 4-6 hours as needed for severe pain. Do not take ibuprofen again for 2-3 days and only take as prescribed over-the-counter. Do not stick anything in your ear. Wear your knee brace at all times except when bathing. However, when bathing attempt to keep knee as straight as possible.  Follow-up: Please follow-up with Dr. Ninfa Linden within one week by calling his office tomorrow to make an appointment. Please follow-up with Dr. Redmond Baseman, and ear nose and throat doctor, if you continue to have problems with your ear. Please return to emergency department if you develop any new or worsening symptoms. Please follow up and establish care with a primary care provider by calling the number circled on your discharge paperwork.

## 2016-10-14 ENCOUNTER — Ambulatory Visit (INDEPENDENT_AMBULATORY_CARE_PROVIDER_SITE_OTHER): Payer: Self-pay | Admitting: Orthopaedic Surgery

## 2016-10-14 ENCOUNTER — Encounter (INDEPENDENT_AMBULATORY_CARE_PROVIDER_SITE_OTHER): Payer: Self-pay | Admitting: Orthopaedic Surgery

## 2016-10-14 DIAGNOSIS — M25561 Pain in right knee: Secondary | ICD-10-CM

## 2016-10-14 HISTORY — DX: Pain in right knee: M25.561

## 2016-10-14 NOTE — Patient Instructions (Signed)

## 2016-10-14 NOTE — Progress Notes (Signed)
Office Visit Note   Patient: [name]           Date of Birth: 09-Jul-1966           MRN: [id number] Visit Date: 10/14/2016              Requested by: No referring provider defined for this encounter. PCP: No PCP Per Patient   Assessment & Plan: Visit Diagnoses:  1. Acute pain of right knee     Plan:  - xrays of knee shows no significant DJD or acute findings - meniscal inflammation, doubt tear - inj given today - f/u 4 weeks for recheck - HEP given   Follow-Up Instructions: Return in about 4 weeks (around 11/11/2016) for recheck knee.   Orders:  Orders Placed This Encounter  Procedures  . Large Joint Injection/Arthrocentesis   No orders of the defined types were placed in this encounter.     Procedures: Large Joint Inj Date/Time: 10/14/2016 10:20 AM Performed by: Leandrew Koyanagi Authorized by: Leandrew Koyanagi   Consent Given by:  Patient Timeout: prior to procedure the correct patient, procedure, and site was verified   Indications:  Pain Location:  Knee Site:  R knee Prep: patient was prepped and draped in usual sterile fashion   Needle Size:  22 G Approach:  Anterolateral Ultrasound Guidance: No   Fluoroscopic Guidance: No       Clinical Data: No additional findings.   Subjective: Chief Complaint  Patient presents with  . Right Knee - Pain, Injury    Fell on 09/28/16.    50 yo female with right knee pain since 09/28/16.  Felt knee giving out and fell.  Went to ER on 10/07/16.  Endorses pain on medial aspect of knee.  Swelling has improved.  norco helps partially.  Feels like needles poking on medial side.  Has been wearing KI.    Review of Systems  Constitutional: Negative.   Cardiovascular: Negative.   Psychiatric/Behavioral: Negative.   All other systems reviewed and are negative.    Objective: Vital Signs: There were no vitals taken for this visit.  Physical Exam  Musculoskeletal:       Right knee: She exhibits no effusion.    GENERAL: patient is healthy appearing, appearing stated age and in no acute distress. HEENT: head is symmetric, atraumatic and normocephalic; pupils are round and equally reactive to light and accomodation. Patient has healthy appearing dentition. Moist mucous membranes. SKIN: warm and well perfused with no evidence of open wounds, rashes, or signs of infection. CARDIOVASCULAR: chest normal appearing, heart is regular rate and rhythm, with no perceived murmurs. Good pulses are present distally with extremities warm and well perfused. RESPIRATORY: Patient is breathing with normal effort. No use of accessory muscles and no appearance of difficulties with respiration. Lungs are clear to auscultation bilaterally, with no evidence of wheezing or crackles. ABDOMEN: soft, nontender, nondistended, with no guarding or rebound, and no perceived masses. Normal bowel sounds. GI: deferred NEURO: Patient with good muscle strength and tone. Deep tendon reflexes intact. Sensation intact. PSYCH: patient is alert and oriented to person, place and time with normal mood and affect.  Right Knee Exam   Tenderness  The patient is experiencing tenderness in the medial joint line.  Range of Motion  Extension: normal   Muscle Strength   The patient has normal right knee strength.  Tests  McMurray:  Medial - negative  Varus: negative Valgus: negative  Other  Erythema: absent  Scars: absent Sensation: normal Other tests: no effusion present      Specialty Comments:  No specialty comments available.  Imaging: No results found.   PMFS History: Patient Active Problem List   Diagnosis Date Noted  . Acute pain of right knee 10/14/2016  . Diabetic neuropathy ([place]) 01/07/2016  . Plantar fasciitis of left foot 11/26/2015  . Back pain 10/10/2015  . Asthma 09/29/2015  . Essential hypertension 09/29/2015  . Diabetes mellitus ([place]) 09/29/2015  . Chest pain 09/28/2015   Past Medical History:   Diagnosis Date  . Asthma   . Hypertension     No family history on file.  Past Surgical History:  Procedure Laterality Date  . ABDOMINAL HYSTERECTOMY    . TUBAL LIGATION     Social History   Occupational History  . Not on file.   Social History Main Topics  . Smoking status: Never Smoker  . Smokeless tobacco: Never Used  . Alcohol use No  . Drug use: No  . Sexual activity: Not Currently

## 2016-11-11 ENCOUNTER — Ambulatory Visit (INDEPENDENT_AMBULATORY_CARE_PROVIDER_SITE_OTHER): Payer: Self-pay | Admitting: Orthopaedic Surgery

## 2017-05-22 ENCOUNTER — Inpatient Hospital Stay (HOSPITAL_COMMUNITY)
Admission: EM | Admit: 2017-05-22 | Discharge: 2017-05-24 | DRG: 202 | Disposition: A | Discharge status: Home / Self Care | Payer: Self-pay | Attending: Internal Medicine | Admitting: Internal Medicine

## 2017-05-22 ENCOUNTER — Encounter (HOSPITAL_COMMUNITY): Payer: Self-pay | Admitting: Emergency Medicine

## 2017-05-22 ENCOUNTER — Emergency Department (HOSPITAL_COMMUNITY): Payer: Self-pay

## 2017-05-22 DIAGNOSIS — E1149 Type 2 diabetes mellitus with other diabetic neurological complication: Secondary | ICD-10-CM

## 2017-05-22 DIAGNOSIS — J45901 Unspecified asthma with (acute) exacerbation: Secondary | ICD-10-CM | POA: Diagnosis present

## 2017-05-22 DIAGNOSIS — R531 Weakness: Secondary | ICD-10-CM | POA: Diagnosis present

## 2017-05-22 DIAGNOSIS — Z7722 Contact with and (suspected) exposure to environmental tobacco smoke (acute) (chronic): Secondary | ICD-10-CM | POA: Diagnosis present

## 2017-05-22 DIAGNOSIS — M5440 Lumbago with sciatica, unspecified side: Secondary | ICD-10-CM | POA: Diagnosis present

## 2017-05-22 DIAGNOSIS — X58XXXA Exposure to other specified factors, initial encounter: Secondary | ICD-10-CM | POA: Diagnosis present

## 2017-05-22 DIAGNOSIS — Z7951 Long term (current) use of inhaled steroids: Secondary | ICD-10-CM

## 2017-05-22 DIAGNOSIS — E119 Type 2 diabetes mellitus without complications: Secondary | ICD-10-CM | POA: Diagnosis present

## 2017-05-22 DIAGNOSIS — M25561 Pain in right knee: Secondary | ICD-10-CM | POA: Diagnosis present

## 2017-05-22 DIAGNOSIS — K219 Gastro-esophageal reflux disease without esophagitis: Secondary | ICD-10-CM | POA: Diagnosis present

## 2017-05-22 DIAGNOSIS — J4541 Moderate persistent asthma with (acute) exacerbation: Principal | ICD-10-CM | POA: Diagnosis present

## 2017-05-22 DIAGNOSIS — F329 Major depressive disorder, single episode, unspecified: Secondary | ICD-10-CM | POA: Diagnosis present

## 2017-05-22 DIAGNOSIS — I10 Essential (primary) hypertension: Secondary | ICD-10-CM | POA: Diagnosis present

## 2017-05-22 DIAGNOSIS — R0602 Shortness of breath: Secondary | ICD-10-CM

## 2017-05-22 DIAGNOSIS — T782XXA Anaphylactic shock, unspecified, initial encounter: Secondary | ICD-10-CM | POA: Diagnosis present

## 2017-05-22 DIAGNOSIS — Z79899 Other long term (current) drug therapy: Secondary | ICD-10-CM

## 2017-05-22 DIAGNOSIS — Z7984 Long term (current) use of oral hypoglycemic drugs: Secondary | ICD-10-CM

## 2017-05-22 DIAGNOSIS — J4521 Mild intermittent asthma with (acute) exacerbation: Secondary | ICD-10-CM

## 2017-05-22 DIAGNOSIS — J45909 Unspecified asthma, uncomplicated: Secondary | ICD-10-CM | POA: Diagnosis present

## 2017-05-22 DIAGNOSIS — G8929 Other chronic pain: Secondary | ICD-10-CM | POA: Diagnosis present

## 2017-05-22 DIAGNOSIS — J9600 Acute respiratory failure, unspecified whether with hypoxia or hypercapnia: Secondary | ICD-10-CM | POA: Diagnosis present

## 2017-05-22 DIAGNOSIS — R000 Tachycardia, unspecified: Secondary | ICD-10-CM | POA: Diagnosis present

## 2017-05-22 DIAGNOSIS — M549 Dorsalgia, unspecified: Secondary | ICD-10-CM | POA: Diagnosis present

## 2017-05-22 LAB — I-STAT ARTERIAL BLOOD GAS, ED
ACID-BASE EXCESS: 2 mmol/L (ref 0.0–2.0)
Bicarbonate: 24.5 mmol/L (ref 20.0–28.0)
O2 Saturation: 97 %
PCO2 ART: 30.5 mmHg — AB (ref 32.0–48.0)
PH ART: 7.512 — AB (ref 7.350–7.450)
PO2 ART: 82 mmHg — AB (ref 83.0–108.0)
TCO2: 25 mmol/L (ref 0–100)

## 2017-05-22 LAB — CBC WITH DIFFERENTIAL/PLATELET
BASOS ABS: 0 10*3/uL (ref 0.0–0.1)
BASOS PCT: 0 %
EOS ABS: 0 10*3/uL (ref 0.0–0.7)
Eosinophils Relative: 1 %
HCT: 42.9 % (ref 36.0–46.0)
HEMOGLOBIN: 13.9 g/dL (ref 12.0–15.0)
Lymphocytes Relative: 38 %
Lymphs Abs: 2.9 10*3/uL (ref 0.7–4.0)
MCH: 29.4 pg (ref 26.0–34.0)
MCHC: 32.4 g/dL (ref 30.0–36.0)
MCV: 90.7 fL (ref 78.0–100.0)
Monocytes Absolute: 0.6 10*3/uL (ref 0.1–1.0)
Monocytes Relative: 8 %
NEUTROS ABS: 4.1 10*3/uL (ref 1.7–7.7)
NEUTROS PCT: 53 %
Platelets: 238 10*3/uL (ref 150–400)
RBC: 4.73 MIL/uL (ref 3.87–5.11)
RDW: 14.1 % (ref 11.5–15.5)
WBC: 7.6 10*3/uL (ref 4.0–10.5)

## 2017-05-22 LAB — BASIC METABOLIC PANEL
ANION GAP: 11 (ref 5–15)
BUN: 14 mg/dL (ref 6–20)
CHLORIDE: 108 mmol/L (ref 101–111)
CO2: 22 mmol/L (ref 22–32)
CREATININE: 0.86 mg/dL (ref 0.44–1.00)
Calcium: 9.6 mg/dL (ref 8.9–10.3)
GFR calc non Af Amer: >60 mL/min (ref >60)
Glucose, Bld: 158 mg/dL — ABNORMAL HIGH (ref 65–99)
POTASSIUM: 3.5 mmol/L (ref 3.5–5.1)
SODIUM: 141 mmol/L (ref 135–145)

## 2017-05-22 LAB — GLUCOSE, CAPILLARY
Glucose-Capillary: 333 mg/dL — ABNORMAL HIGH (ref 65–99)
Glucose-Capillary: 221 mg/dL — ABNORMAL HIGH (ref 65–99)

## 2017-05-22 LAB — I-STAT BETA HCG BLOOD, ED (MC, WL, AP ONLY)

## 2017-05-22 LAB — MAGNESIUM: Magnesium: 2.1 mg/dL (ref 1.7–2.4)

## 2017-05-22 LAB — BRAIN NATRIURETIC PEPTIDE: B NATRIURETIC PEPTIDE 5: 97.6 pg/mL (ref 0.0–100.0)

## 2017-05-22 LAB — TROPONIN I

## 2017-05-22 MED ORDER — FLUTICASONE PROPIONATE 50 MCG/ACT NA SUSP
1.0000 | Freq: Every day | NASAL | Status: DC
  Administered 2017-05-22 – 2017-05-23 (×2): 1 via NASAL
  Filled 2017-05-22: qty 16

## 2017-05-22 MED ORDER — GABAPENTIN 300 MG PO CAPS
300.0000 mg | ORAL_CAPSULE | Freq: Two times a day (BID) | ORAL | Status: DC
  Administered 2017-05-22 – 2017-05-24 (×4): 300 mg via ORAL
  Filled 2017-05-22 (×4): qty 1

## 2017-05-22 MED ORDER — ALBUTEROL (5 MG/ML) CONTINUOUS INHALATION SOLN
10.0000 mg/h | INHALATION_SOLUTION | Freq: Once | RESPIRATORY_TRACT | Status: AC
  Administered 2017-05-22: 10 mg/h via RESPIRATORY_TRACT
  Filled 2017-05-22: qty 20

## 2017-05-22 MED ORDER — INSULIN ASPART 100 UNIT/ML ~~LOC~~ SOLN
0.0000 [IU] | Freq: Three times a day (TID) | SUBCUTANEOUS | Status: DC
Start: 2017-05-22 — End: 2017-05-24
  Administered 2017-05-22: 15 [IU] via SUBCUTANEOUS
  Administered 2017-05-23: 7 [IU] via SUBCUTANEOUS
  Administered 2017-05-23: 3 [IU] via SUBCUTANEOUS
  Administered 2017-05-23: 7 [IU] via SUBCUTANEOUS
  Administered 2017-05-24: 4 [IU] via SUBCUTANEOUS
  Administered 2017-05-24: 3 [IU] via SUBCUTANEOUS
  Administered 2017-05-24: 7 [IU] via SUBCUTANEOUS

## 2017-05-22 MED ORDER — IPRATROPIUM BROMIDE 0.02 % IN SOLN
1.0000 mg | Freq: Once | RESPIRATORY_TRACT | Status: AC
  Administered 2017-05-22: 1 mg via RESPIRATORY_TRACT
  Filled 2017-05-22: qty 5

## 2017-05-22 MED ORDER — ENOXAPARIN SODIUM 40 MG/0.4ML ~~LOC~~ SOLN
40.0000 mg | SUBCUTANEOUS | Status: DC
  Administered 2017-05-22 – 2017-05-23 (×2): 40 mg via SUBCUTANEOUS
  Filled 2017-05-22 (×2): qty 0.4

## 2017-05-22 MED ORDER — DIPHENHYDRAMINE HCL 50 MG/ML IJ SOLN
50.0000 mg | Freq: Once | INTRAMUSCULAR | Status: AC
  Administered 2017-05-22: 50 mg via INTRAVENOUS
  Filled 2017-05-22: qty 1

## 2017-05-22 MED ORDER — IOPAMIDOL (ISOVUE-370) INJECTION 76%
INTRAVENOUS | Status: AC
  Administered 2017-05-22: 100 mL
  Filled 2017-05-22: qty 100

## 2017-05-22 MED ORDER — PANTOPRAZOLE SODIUM 40 MG PO TBEC
40.0000 mg | DELAYED_RELEASE_TABLET | Freq: Every day | ORAL | Status: DC
  Administered 2017-05-23 – 2017-05-24 (×2): 40 mg via ORAL
  Filled 2017-05-22 (×2): qty 1

## 2017-05-22 MED ORDER — PREDNISONE 20 MG PO TABS: 40.0000 mg | ORAL_TABLET | Freq: Every day | ORAL | Status: DC

## 2017-05-22 MED ORDER — ACETAMINOPHEN 650 MG RE SUPP: 650.0000 mg | Freq: Four times a day (QID) | RECTAL | Status: DC | PRN

## 2017-05-22 MED ORDER — LISINOPRIL 5 MG PO TABS
5.0000 mg | ORAL_TABLET | Freq: Every day | ORAL | Status: DC
  Administered 2017-05-23 – 2017-05-24 (×2): 5 mg via ORAL
  Filled 2017-05-22 (×2): qty 1

## 2017-05-22 MED ORDER — EPINEPHRINE 0.3 MG/0.3ML IJ SOAJ
INTRAMUSCULAR | Status: AC
  Filled 2017-05-22: qty 0.3

## 2017-05-22 MED ORDER — METHYLPREDNISOLONE SODIUM SUCC 125 MG IJ SOLR
125.0000 mg | Freq: Once | INTRAMUSCULAR | Status: AC
  Administered 2017-05-22: 125 mg via INTRAVENOUS
  Filled 2017-05-22: qty 2

## 2017-05-22 MED ORDER — SERTRALINE HCL 50 MG PO TABS
75.0000 mg | ORAL_TABLET | Freq: Every day | ORAL | Status: DC
  Administered 2017-05-22 – 2017-05-24 (×3): 75 mg via ORAL
  Filled 2017-05-22 (×4): qty 1

## 2017-05-22 MED ORDER — ACETAMINOPHEN 325 MG PO TABS
650.0000 mg | ORAL_TABLET | Freq: Four times a day (QID) | ORAL | Status: DC | PRN
  Administered 2017-05-23: 650 mg via ORAL
  Filled 2017-05-22: qty 2

## 2017-05-22 MED ORDER — PREDNISONE 20 MG PO TABS
40.0000 mg | ORAL_TABLET | Freq: Every day | ORAL | Status: DC
  Administered 2017-05-23 – 2017-05-24 (×2): 40 mg via ORAL
  Filled 2017-05-22 (×2): qty 2

## 2017-05-22 MED ORDER — LORATADINE 10 MG PO TABS
10.0000 mg | ORAL_TABLET | Freq: Every day | ORAL | Status: DC
  Administered 2017-05-23 – 2017-05-24 (×2): 10 mg via ORAL
  Filled 2017-05-22 (×2): qty 1

## 2017-05-22 MED ORDER — IPRATROPIUM-ALBUTEROL 0.5-2.5 (3) MG/3ML IN SOLN
3.0000 mL | Freq: Four times a day (QID) | RESPIRATORY_TRACT | Status: DC
  Administered 2017-05-22: 3 mL via RESPIRATORY_TRACT
  Filled 2017-05-22: qty 3

## 2017-05-22 MED ORDER — FAMOTIDINE IN NACL 20-0.9 MG/50ML-% IV SOLN
20.0000 mg | Freq: Once | INTRAVENOUS | Status: AC
  Administered 2017-05-22: 20 mg via INTRAVENOUS
  Filled 2017-05-22: qty 50

## 2017-05-22 MED ORDER — MAGNESIUM SULFATE 2 GM/50ML IV SOLN
2.0000 g | Freq: Once | INTRAVENOUS | Status: AC
  Administered 2017-05-22: 2 g via INTRAVENOUS
  Filled 2017-05-22: qty 50

## 2017-05-22 MED ORDER — SODIUM CHLORIDE 0.9 % IV BOLUS (SEPSIS)
1000.0000 mL | Freq: Once | INTRAVENOUS | Status: AC
  Administered 2017-05-22: 1000 mL via INTRAVENOUS

## 2017-05-22 MED ORDER — SODIUM CHLORIDE 0.9 % IV SOLN: INTRAVENOUS | Status: AC

## 2017-05-22 MED ORDER — ALBUTEROL SULFATE (2.5 MG/3ML) 0.083% IN NEBU
5.0000 mg | INHALATION_SOLUTION | Freq: Once | RESPIRATORY_TRACT | Status: AC
  Administered 2017-05-22: 5 mg via RESPIRATORY_TRACT
  Filled 2017-05-22: qty 6

## 2017-05-22 NOTE — ED Notes (Signed)
Attempted report x1. 

## 2017-05-22 NOTE — ED Notes (Signed)
Patient to CT.

## 2017-05-22 NOTE — H&P (Signed)
Date: 05/22/2017               Patient Name:  [name] MRN: [id number]  DOB: [birth year] Age / Sex: 51 y.o., female   PCP: Patient, No Pcp Per         Medical Service: Internal Medicine Teaching Service         Attending Physician: Dr. Sid Falcon, MD    First Contact: Dr. Lovena Le Pager: [phone]  Second Contact: Dr. Posey Pronto Pager: [phone]       After Hours (After 5p/  First Contact Pager: [phone]  weekends / holidays): Second Contact Pager: [phone]   Chief Complaint: difficulty breathing  History of Present Illness: Ms. Agresti is a 51 year old woman with moderate persistent asthma, allergic rhinitis, non-insulin dependent Type 2 diabetes, hypertension, GERD, chronic low back pain with sciatica, depression who presented to the ED this morning with difficulty breathing.  Yesterday during her physical therapy session, she noticed difficulty breathing which improved with her albuterol inhaler. Overnight, she felt dizzy, and this morning en route to her grandson's school, she became acutely dyspneic with wheezing, cough, and chest pain. Her asthma spans her life and is particularly worse in the summertime with about 3 episodes/year though she has never been intubated. She takes albuterol as needed and beclomethasone twice daily with good control of her symptoms. She is allergic to pollen and takes loratidine 10 mg twice daily though acknowledges nasal itching and congestion. Both her daughter and granddaughter have been diagnosed with bronchitis. She is a Health and safety inspector at [employer] and does not smoke or drink alcohol though her daughter smokes cigarettes. She denies fevers, sick contacts, changes in appetite, leg swelling, prior cardiac disease.  In the ED, she received epinephrine, diphenhydramine, and famotidine for possible anaphylaxis. CTA was negative for PE. She was given albuterol, ipratropium, magnesium, and methylprednisolone.  Meds:  Current Meds  Medication Sig   . albuterol (PROVENTIL HFA;VENTOLIN HFA) 108 (90 Base) MCG/ACT inhaler Inhale 1-2 puffs into the lungs every 6 (six) hours as needed for wheezing or shortness of breath.  . gabapentin (NEURONTIN) 300 MG capsule Take 1 capsule (300 mg total) by mouth 2 (two) times daily.  Marland Kitchen glipiZIDE (GLUCOTROL) 5 MG tablet Take 0.5 tablets (2.5 mg total) by mouth daily before breakfast. (Patient taking differently: Take 5 mg by mouth every morning. )  . guaiFENesin (MUCINEX) 600 MG 12 hr tablet Take 600 mg by mouth 2 (two) times daily.  Marland Kitchen ibuprofen (ADVIL,MOTRIN) 600 MG tablet Take 1 tablet (600 mg total) by mouth every 8 (eight) hours as needed. (Patient taking differently: Take 600 mg by mouth every 8 (eight) hours as needed for mild pain. )  . lisinopril (PRINIVIL,ZESTRIL) 2.5 MG tablet Take 1 tablet (2.5 mg total) by mouth daily.  Marland Kitchen lisinopril (PRINIVIL,ZESTRIL) 5 MG tablet Take 5 mg by mouth daily.  Marland Kitchen loratadine (CLARITIN) 10 MG tablet Take 10 mg by mouth 2 (two) times daily.   Marland Kitchen omeprazole (PRILOSEC) 20 MG capsule Take 20 mg by mouth daily.  . polyethylene glycol (MIRALAX / GLYCOLAX) packet Take 17 g by mouth every morning.  . sertraline (ZOLOFT) 50 MG tablet Take 75 mg by mouth daily.     Allergies: Allergies as of 05/22/2017  . (No Known Allergies)   Past Medical History:  Diagnosis Date  . Asthma   . Hypertension     Family History: As noted in the HPI.  Social History: As noted in  the HPI.  Review of Systems: A complete ROS was negative except as per HPI.   Physical Exam: Blood pressure 125/74, pulse (!) 113, temperature 98.1 F (36.7 C), temperature source Temporal, resp. rate (!) 23, SpO2 93 %. Physical Exam  Constitutional: She is oriented to person, place, and time. No distress.  HENT:  Head: Normocephalic and atraumatic.  Eyes: Conjunctivae are normal. No scleral icterus.  Cardiovascular: Normal heart sounds.   TAchycardic  Pulmonary/Chest: No respiratory distress.  Mild  end-expiratory wheezing  Abdominal: Soft. She exhibits no distension.  Neurological: She is alert and oriented to person, place, and time.  Skin: She is not diaphoretic.    EKG: I reviewed and compared with 09/30/15. -Normal sinus rhythm, axis  CXR: I reviewed and compared with 09/28/15.  -Difficult to assess for cardiomegaly given positioning  Assessment & Plan by Problem: Principal Problem:   Asthma exacerbation Active Problems:   Essential hypertension   Diabetes mellitus ([place])  Ms. Gutterman is a 51 year old woman with moderate persistent asthma, allergic rhinitis, non-insulin dependent Type 2 diabetes, hypertension, GERD, chronic low back pain with sciatica, depression hospitalized for acute asthma exacerbation.  Acute asthma exacerbation: Trigger is likely allergic. No PFTs on our file for review. -Continue Duonebs every 6 hours  -Start prednisone 40 mg -Start fluticasone nasal spray for congestion -Continue loratidine 10 mg daily -Recheck electrolytes tomorrow after IV fluids to prevent contrast-induced nephropathy -Advise daughter to quit smoking to help her mother stay out of the hospital.  Non-insulin dependent type 2 diabetes: Continue SSI-R given prednisoe.  Hypertension: Continue lisinopril 5 mg.  GERD: Continue pantoprazole 20 mg.  Chronic low back pain with sciatica: Continue gabapentin 300 mg twice daily.  Depression: Continue sertraline 75 mg daily.  #FEN:  -Diet: Carb Modified -NS @ 75 cc/hr x 12  #DVT prophylaxis: Lovenox  #CODE STATUS: FULL CODE -Defer to daughter Lawrence Santiago [phone] if patients lacks decision-making capacity -Confirmed with patient on admission  Dispo: Admit patient to Observation with expected length of stay less than 2 midnights.  Signed: Riccardo Dubin, MD 05/22/2017, 2:41 PM  Pager: [phone]

## 2017-05-22 NOTE — ED Triage Notes (Signed)
Patient brought in by daughter for shortness of breath x2 days.  Patient has history of asthma, states she has been using her albuterol inhaler at home but without relief.  Patient presents with audible wheezing, O2 sat 99%.

## 2017-05-22 NOTE — ED Provider Notes (Signed)
[facility] DEPT Provider Note   CSN: [id number] Arrival date & time: 05/22/17  3335     History   Chief Complaint No chief complaint on file.   HPI [name] is a 51 y.o. female.  HPI   51 year old female with past medical history of asthma, diabetes, who presents in extremis. History is limited due to severe shortness of breath. Patient states that she was well prior to this morning. She expressed acute onset of severe shortness of breath. On arrival, she is dyspneic and speaking in one-word sentences only. Family is not present to provide further history. She does shake her head yes to this feeling like a prior asthma attack. She shakes her head no to any recent new allergy exposures or insect bites.  Level 5 caveat invoked as remainder of history, ROS, and physical exam limited due to patient's work of breathing.   Past Medical History:  Diagnosis Date  . Asthma   . Hypertension     Patient Active Problem List   Diagnosis Date Noted  . Asthma exacerbation 05/22/2017  . Acute pain of right knee 10/14/2016  . Diabetic neuropathy ([place]) 01/07/2016  . Plantar fasciitis of left foot 11/26/2015  . Back pain 10/10/2015  . Asthma 09/29/2015  . Essential hypertension 09/29/2015  . Diabetes mellitus ([place]) 09/29/2015  . Chest pain 09/28/2015    Past Surgical History:  Procedure Laterality Date  . ABDOMINAL HYSTERECTOMY    . TUBAL LIGATION      OB History    No data available       Home Medications    Prior to Admission medications   Medication Sig Start Date End Date Taking? Authorizing Provider  albuterol (PROVENTIL HFA;VENTOLIN HFA) 108 (90 Base) MCG/ACT inhaler Inhale 1-2 puffs into the lungs every 6 (six) hours as needed for wheezing or shortness of breath. 01/07/16  Yes Arnoldo Morale, MD  gabapentin (NEURONTIN) 300 MG capsule Take 1 capsule (300 mg total) by mouth 2 (two) times daily. 01/07/16  Yes Arnoldo Morale, MD  glipiZIDE (GLUCOTROL) 5 MG tablet  Take 0.5 tablets (2.5 mg total) by mouth daily before breakfast. Patient taking differently: Take 5 mg by mouth every morning.  01/07/16  Yes Arnoldo Morale, MD  guaiFENesin (MUCINEX) 600 MG 12 hr tablet Take 600 mg by mouth 2 (two) times daily.   Yes [provider]  ibuprofen (ADVIL,MOTRIN) 600 MG tablet Take 1 tablet (600 mg total) by mouth every 8 (eight) hours as needed. Patient taking differently: Take 600 mg by mouth every 8 (eight) hours as needed for mild pain.  01/07/16  Yes Arnoldo Morale, MD  lisinopril (PRINIVIL,ZESTRIL) 2.5 MG tablet Take 1 tablet (2.5 mg total) by mouth daily. 01/07/16  Yes Arnoldo Morale, MD  lisinopril (PRINIVIL,ZESTRIL) 5 MG tablet Take 5 mg by mouth daily.   Yes [provider]  loratadine (CLARITIN) 10 MG tablet Take 10 mg by mouth 2 (two) times daily.    Yes [provider]  omeprazole (PRILOSEC) 20 MG capsule Take 20 mg by mouth daily.   Yes [provider]  polyethylene glycol (MIRALAX / GLYCOLAX) packet Take 17 g by mouth every morning.   Yes [provider]  sertraline (ZOLOFT) 50 MG tablet Take 75 mg by mouth daily.   Yes [provider]  beclomethasone (QVAR) 80 MCG/ACT inhaler Inhale 1 puff into the lungs 2 (two) times daily. Patient not taking: Reported on 05/22/2017 01/07/16   Arnoldo Morale, MD  HYDROcodone-acetaminophen (NORCO/VICODIN) [phone]  MG tablet Take 1-2 tablets by mouth every 6 (six) hours as needed. Patient not taking: Reported on 05/22/2017 10/07/16   Frederica Kuster, PA-C  traMADol (ULTRAM) 50 MG tablet Take 1 tablet (50 mg total) by mouth every 8 (eight) hours as needed. Patient not taking: Reported on 05/22/2017 01/07/16   Arnoldo Morale, MD    Family History No family history on file.  Social History Social History  Substance Use Topics  . Smoking status: Never Smoker  . Smokeless tobacco: Never Used  . Alcohol use No     Allergies   Patient has no known allergies.   Review of  Systems Review of Systems  Unable to perform ROS: Acuity of condition  Respiratory: Positive for cough, chest tightness, shortness of breath and wheezing.      Physical Exam Updated Vital Signs BP (!) 144/89 (BP Location: Left Arm)   Pulse 90   Temp 98.3 F (36.8 C) (Oral)   Resp 18   Ht 5\' 1"  (1.549 m)   Wt 78.1 kg (172 lb 1.6 oz)   SpO2 100%   BMI 32.52 kg/m   Physical Exam  Constitutional: She is oriented to person, place, and time. She appears well-developed and well-nourished. She appears distressed.  HENT:  Head: Normocephalic and atraumatic.  Eyes: Conjunctivae are normal.  Neck: Neck supple.  Cardiovascular: Normal rate, regular rhythm and normal heart sounds.  Exam reveals no friction rub.   No murmur heard. Pulmonary/Chest: Accessory muscle usage present. Tachypnea noted. She is in respiratory distress. She has decreased breath sounds. She has wheezes. She has no rales.  Abdominal: She exhibits no distension.  Musculoskeletal: She exhibits no edema.  Neurological: She is alert and oriented to person, place, and time. She exhibits normal muscle tone.  Skin: Skin is warm. Capillary refill takes less than 2 seconds.  Psychiatric: She has a normal mood and affect.  Nursing note and vitals reviewed.    ED Treatments / Results  Labs (all labs ordered are listed, but only abnormal results are displayed) Labs Reviewed  BASIC METABOLIC PANEL - Abnormal; Notable for the following:       Result Value   Glucose, Bld 158 (*)    All other components within normal limits  I-STAT ARTERIAL BLOOD GAS, ED - Abnormal; Notable for the following:    pH, Arterial 7.512 (*)    pCO2 arterial 30.5 (*)    pO2, Arterial 82.0 (*)    All other components within normal limits  CBC WITH DIFFERENTIAL/PLATELET  BRAIN NATRIURETIC PEPTIDE  TROPONIN I  MAGNESIUM  BASIC METABOLIC PANEL  HIV ANTIBODY (ROUTINE TESTING)  I-STAT BETA HCG BLOOD, ED ([facility], [facility], [facility] ONLY)    EKG  EKG  Interpretation  Date/Time:  Friday May 22 2017 08:03:49 EDT Ventricular Rate:  77 PR Interval:    QRS Duration: 89 QT Interval:  437 QTC Calculation: 495 R Axis:   58 Text Interpretation:  Sinus rhythm Borderline prolonged QT interval Artifact in lead(s) V5 No significant change since last tracing Confirmed by Duffy Bruce [phone]) on 05/22/2017 8:13:36 AM       Radiology Ct Angio Chest Pe W Or Wo Contrast  Result Date: 05/22/2017 CLINICAL DATA:  Mid chest pain. Short of breath 2 days. Concern for pulmonary embolism. EXAM: CT ANGIOGRAPHY CHEST WITH CONTRAST TECHNIQUE: Multidetector CT imaging of the chest was performed using the standard protocol during bolus administration of intravenous contrast. Multiplanar CT image reconstructions and MIPs were obtained to evaluate the  vascular anatomy. CONTRAST:  65 cc Isovue COMPARISON:  Chest CT 04/04/2013. FINDINGS: Cardiovascular: No filling defects within the pulmonary arteries arteries to suggest acute pulmonary embolism. No acute findings aorta great vessels. No pericardial fluid. This change Mediastinum/Nodes: No axillary supraclavicular adenopathy. No mediastinal hilar adenopathy. Esophagus normal Lungs/Pleura: No pulmonary infarction. No pneumonia or pneumothorax. Mild linear basilar atelectasis. No suspicious nodularity. Upper Abdomen: Limited view of the liver, kidneys, pancreas are unremarkable. Normal adrenal glands. Musculoskeletal: No aggressive osseous lesion. Review of the MIP images confirms the above findings. IMPRESSION: 1. No evidence acute pulmonary embolism. 2. Mild basilar lung atelectasis. Electronically Signed   By: Suzy Bouchard M.D.   On: 05/22/2017 11:32   Dg Chest Portable 1 View  Result Date: 05/22/2017 CLINICAL DATA:  Shortness of breath. EXAM: PORTABLE CHEST 1 VIEW COMPARISON:  Two-view chest x-ray 09/28/2015 FINDINGS: The heart is mildly enlarged. Mild edema is now present. Small bilateral pleural effusions are noted.  Bibasilar airspace disease likely reflects atelectasis. IMPRESSION: 1. Mild cardiomegaly, edema, and effusions suggesting congestive heart failure. 2. Bibasilar airspace opacities likely reflect atelectasis. Electronically Signed   By: San Morelle M.D.   On: 05/22/2017 08:42    Procedures .Critical Care Performed by: Duffy Bruce Authorized by: Duffy Bruce      (including critical care time)  CRITICAL CARE Performed by: Evonnie Pat   Total critical care time: 45 minutes  Critical care time was exclusive of separately billable procedures and treating other patients.  Critical care was necessary to treat or prevent imminent or life-threatening deterioration.  Critical care was time spent personally by me on the following activities: development of treatment plan with patient and/or surrogate as well as nursing, discussions with consultants, evaluation of patient's response to treatment, examination of patient, obtaining history from patient or surrogate, ordering and performing treatments and interventions, ordering and review of laboratory studies, ordering and review of radiographic studies, pulse oximetry and re-evaluation of patient's condition.    Medications Ordered in ED Medications  EPINEPHrine (EPI-PEN) 0.3 mg/0.3 mL injection (not administered)  ipratropium-albuterol (DUONEB) 0.5-2.5 (3) MG/3ML nebulizer solution 3 mL (3 mLs Nebulization Not Given 05/22/17 1400)  enoxaparin (LOVENOX) injection 40 mg (not administered)  acetaminophen (TYLENOL) tablet 650 mg (not administered)    Or  acetaminophen (TYLENOL) suppository 650 mg (not administered)  insulin aspart (novoLOG) injection 0-20 Units (not administered)  predniSONE (DELTASONE) tablet 40 mg (not administered)  gabapentin (NEURONTIN) capsule 300 mg (not administered)  lisinopril (PRINIVIL,ZESTRIL) tablet 5 mg (not administered)  loratadine (CLARITIN) tablet 10 mg (not administered)  pantoprazole  (PROTONIX) EC tablet 40 mg (not administered)  sertraline (ZOLOFT) tablet 75 mg (not administered)  fluticasone (FLONASE) 50 MCG/ACT nasal spray 1 spray (not administered)  0.9 %  sodium chloride infusion (not administered)  albuterol (PROVENTIL,VENTOLIN) solution continuous neb (10 mg/hr Nebulization Given 05/22/17 0811)  ipratropium (ATROVENT) nebulizer solution 1 mg (1 mg Nebulization Given 05/22/17 0811)  methylPREDNISolone sodium succinate (SOLU-MEDROL) 125 mg/2 mL injection 125 mg (125 mg Intravenous Given 05/22/17 0818)  diphenhydrAMINE (BENADRYL) injection 50 mg (50 mg Intravenous Given 05/22/17 0818)  famotidine (PEPCID) IVPB 20 mg premix (0 mg Intravenous Stopped 05/22/17 0838)  magnesium sulfate IVPB 2 g 50 mL (0 g Intravenous Stopped 05/22/17 0952)  sodium chloride 0.9 % bolus 1,000 mL (0 mLs Intravenous Stopped 05/22/17 1039)  albuterol (PROVENTIL) (2.5 MG/3ML) 0.083% nebulizer solution 5 mg (5 mg Nebulization Given 05/22/17 1034)  iopamidol (ISOVUE-370) 76 % injection (100 mLs  Contrast Given 05/22/17 1110)  Initial Impression / Assessment and Plan / ED Course  I have reviewed the triage vital signs and the nursing notes.  Pertinent labs & imaging results that were available during my care of the patient were reviewed by me and considered in my medical decision making (see chart for details).     51 year old female with past medical history of asthma here with several days of shortness of breath and acute onset of worsening shortness of breath today. On arrival, patient in marked respiratory distress with tachypnea, speaking in one-word sentences only. Patient given epinephrine, albuterol, magnesium, and continuous nebulizer with significant improvement. She has bronchitis on chest x-ray with initial concern for possible CHF, although troponin and BNP are negative and I suspect this is secondary to atelectasis and her asthma. Following a period of observation, patient improved but remains  tachypnea And intermittently in the low 90s on room air. Will admit for continued bronchodilators, steroids, and management of acute asthma exacerbation. Due to CXR findings and persistent tachycardia, CT Angio obtained and fortunately is negative for PE or PNA.  Final Clinical Impressions(s) / ED Diagnoses   Final diagnoses:  SOB (shortness of breath)  Mild intermittent asthma with acute exacerbation    New Prescriptions Current Discharge Medication List       Duffy Bruce, MD 05/22/17 1825

## 2017-05-23 DIAGNOSIS — J4541 Moderate persistent asthma with (acute) exacerbation: Principal | ICD-10-CM

## 2017-05-23 LAB — BASIC METABOLIC PANEL
ANION GAP: 12 (ref 5–15)
BUN: 9 mg/dL (ref 6–20)
CO2: 23 mmol/L (ref 22–32)
CREATININE: 0.95 mg/dL (ref 0.44–1.00)
Calcium: 9.4 mg/dL (ref 8.9–10.3)
Chloride: 102 mmol/L (ref 101–111)
GFR calc Af Amer: >60 mL/min (ref >60)
GFR calc non Af Amer: >60 mL/min (ref >60)
GLUCOSE: 203 mg/dL — AB (ref 65–99)
Potassium: 3 mmol/L — ABNORMAL LOW (ref 3.5–5.1)
Sodium: 137 mmol/L (ref 135–145)

## 2017-05-23 LAB — GLUCOSE, CAPILLARY
GLUCOSE-CAPILLARY: 199 mg/dL — AB (ref 65–99)
Glucose-Capillary: 149 mg/dL — ABNORMAL HIGH (ref 65–99)
Glucose-Capillary: 241 mg/dL — ABNORMAL HIGH (ref 65–99)
Glucose-Capillary: 237 mg/dL — ABNORMAL HIGH (ref 65–99)

## 2017-05-23 LAB — HIV ANTIBODY (ROUTINE TESTING W REFLEX): HIV Screen 4th Generation wRfx: NONREACTIVE

## 2017-05-23 MED ORDER — LORATADINE 10 MG PO TABS: 10.0000 mg | ORAL_TABLET | Freq: Every day | ORAL | 1 refills | Status: DC

## 2017-05-23 MED ORDER — ALBUTEROL SULFATE (2.5 MG/3ML) 0.083% IN NEBU: 2.5000 mg | INHALATION_SOLUTION | RESPIRATORY_TRACT | Status: DC | PRN

## 2017-05-23 MED ORDER — POTASSIUM CHLORIDE CRYS ER 20 MEQ PO TBCR
40.0000 meq | EXTENDED_RELEASE_TABLET | Freq: Once | ORAL | Status: AC
  Administered 2017-05-23: 40 meq via ORAL
  Filled 2017-05-23: qty 2

## 2017-05-23 MED ORDER — HYDROCOD POLST-CPM POLST ER 10-8 MG/5ML PO SUER
5.0000 mL | Freq: Two times a day (BID) | ORAL | Status: DC
  Administered 2017-05-23 – 2017-05-24 (×3): 5 mL via ORAL
  Filled 2017-05-23 (×3): qty 5

## 2017-05-23 MED ORDER — HYDROCOD POLST-CPM POLST ER 10-8 MG/5ML PO SUER: 5.0000 mL | Freq: Two times a day (BID) | ORAL | 0 refills | Status: DC

## 2017-05-23 MED ORDER — ALBUTEROL SULFATE (2.5 MG/3ML) 0.083% IN NEBU
2.5000 mg | INHALATION_SOLUTION | RESPIRATORY_TRACT | Status: DC | PRN
  Administered 2017-05-23: 2.5 mg via RESPIRATORY_TRACT
  Filled 2017-05-23: qty 3

## 2017-05-23 MED ORDER — SENNOSIDES-DOCUSATE SODIUM 8.6-50 MG PO TABS
1.0000 | ORAL_TABLET | Freq: Every day | ORAL | Status: DC | PRN
  Administered 2017-05-23: 1 via ORAL
  Filled 2017-05-23: qty 1

## 2017-05-23 MED ORDER — DIPHENHYDRAMINE HCL 25 MG PO CAPS
25.0000 mg | ORAL_CAPSULE | Freq: Every evening | ORAL | Status: DC | PRN
  Administered 2017-05-23 – 2017-05-24 (×2): 25 mg via ORAL
  Filled 2017-05-23 (×3): qty 1

## 2017-05-23 MED ORDER — PREDNISONE 20 MG PO TABS: 40.0000 mg | ORAL_TABLET | Freq: Every day | ORAL | 0 refills | Status: AC

## 2017-05-23 MED ORDER — GUAIFENESIN ER 600 MG PO TB12
600.0000 mg | ORAL_TABLET | Freq: Two times a day (BID) | ORAL | Status: DC
  Administered 2017-05-23 – 2017-05-24 (×3): 600 mg via ORAL
  Filled 2017-05-23 (×3): qty 1

## 2017-05-23 MED ORDER — IPRATROPIUM-ALBUTEROL 0.5-2.5 (3) MG/3ML IN SOLN
3.0000 mL | Freq: Three times a day (TID) | RESPIRATORY_TRACT | Status: DC
  Administered 2017-05-23 – 2017-05-24 (×4): 3 mL via RESPIRATORY_TRACT
  Filled 2017-05-23 (×4): qty 3

## 2017-05-23 MED ORDER — POLYETHYLENE GLYCOL 3350 17 G PO PACK
17.0000 g | PACK | Freq: Every day | ORAL | Status: DC | PRN
  Administered 2017-05-23: 17 g via ORAL
  Filled 2017-05-23: qty 1

## 2017-05-23 NOTE — Progress Notes (Signed)
Patient becomes very anxious and states she cant breathe, oxygen saturation on room air remains 99%.  Cyndia Bent RN

## 2017-05-23 NOTE — Care Management Note (Signed)
Case Management Note  Patient Details  Name: [name] MRN: [id number] Date of Birth: 07/22/1966  Subjective/Objective:                 Patient will DC to home. DME nebulizer to be delivered to room prior to DC. Patient gets meds with orange card at [facility]. No other CM needs PCP [facility] last seen in February Pharmacy- Orange Card will use [facility]   Action/Plan:  DC to home with Nebulizer  Expected Discharge Date:                  Expected Discharge Plan:  Home/Self Care  In-House Referral:     Discharge planning Services  CM Consult  Post Acute Care Choice:  Durable Medical Equipment Choice offered to:  Patient  DME Arranged:  Nebulizer machine DME Agency:  [place]:    [facility] Agency:     Status of Service:  Completed, signed off  If discussed at [place] of Stay Meetings, dates discussed:    Additional Comments:  Carles Collet, RN 05/23/2017, 11:22 AM

## 2017-05-23 NOTE — Progress Notes (Signed)
[facility] was paged regarding [name] developing acute onset difficulty walking. [name] was scheduled for discharge today after management of asthma exacerbation. Upon arrival to her room at approximately 5:00 pm, [name] was sitting on the side of her bed, surrounded by multiple members of her family.  Starting a few hours prior, [name] developed sudden onset weakness of legs. Prior to onset, [name] had no muscle weakness and no issue ambulating as per RN notes. The family members attempted to help [name] walk, with one member on either side holding onto the patient. With help, the patient was able to stand from bed, but her legs were visibly shaking. The patient was able to make one unsteady step forward with help, legs shaking continuously. The patient was then helped back to her bed.  The patient was alert and oriented x4. Strength test revealed lack of strength possibly due to effort bilaterally throughout lower extremities, upper extremity strength 5/5. The patient's legs were noted to be tremulous throughout interview and exam.   With distraction, tremulousness would subside briefly.  Discharge vs. additional night was discussed with the patient and family members, and there was a clear consensus to keep patient an additional night in hospital.  She denies any visual disturbances. The etiology of the weakness is not clear. Psychogenic is concerning given that the patient seemed nervous and uncomfortable about the idea of leaving the hospital to live alone (as she had been prior to admission). Adverse reaction to steroids is also on differential given that this appears to be the first time the patient has received steroid treatment.   - continue scheduled nebulizer treatments - next scheduled dose of prednisone is not until 8am tomorrow.  Will plan to continue this for now. - check peak expiratory flow in AM - place back on oxygen as this appears to make patient more  comfortable - cancel discharge orders  Jule Ser, DO 05/23/2017, 6:34 PM PGY-2, [facility] Pager: [phone]

## 2017-05-23 NOTE — Discharge Summary (Signed)
Name: [name] MRN: [id number] DOB: 06/03/1966 51 y.o. PCP: Patient, No Pcp Per  Date of Admission: 05/22/2017  7:49 AM Date of Discharge: 05/24/2017 Attending Physician: No att. providers found  Discharge Diagnosis: 1. Severe asthma exacerbation 2. Question anaphylaxis? Principal Problem:   Asthma exacerbation Active Problems:   Asthma   Essential hypertension   Diabetes mellitus ([place])   Back pain   Discharge Medications: Allergies as of 05/24/2017   No Known Allergies     Medication List    STOP taking these medications   HYDROcodone-acetaminophen 5-325 MG tablet Commonly known as:  NORCO/VICODIN   ibuprofen 600 MG tablet Commonly known as:  ADVIL,MOTRIN   traMADol 50 MG tablet Commonly known as:  ULTRAM     TAKE these medications   albuterol 108 (90 Base) MCG/ACT inhaler Commonly known as:  PROVENTIL HFA;VENTOLIN HFA Inhale 1-2 puffs into the lungs every 6 (six) hours as needed for wheezing or shortness of breath. What changed:  Another medication with the same name was added. Make sure you understand how and when to take each.   albuterol (2.5 MG/3ML) 0.083% nebulizer solution Commonly known as:  PROVENTIL Take 3 mLs (2.5 mg total) by nebulization every 2 (two) hours as needed for wheezing or shortness of breath. What changed:  You were already taking a medication with the same name, and this prescription was added. Make sure you understand how and when to take each.   beclomethasone 80 MCG/ACT inhaler Commonly known as:  QVAR Inhale 1 puff into the lungs 2 (two) times daily.   chlorpheniramine-HYDROcodone 10-8 MG/5ML Suer Commonly known as:  TUSSIONEX Take 5 mLs by mouth every 12 (twelve) hours.   diclofenac sodium 1 % Gel Commonly known as:  VOLTAREN Apply 2 g topically 4 (four) times daily.   gabapentin 300 MG capsule Commonly known as:  NEURONTIN Take 1 capsule (300 mg total) by mouth 2 (two) times daily.   glipiZIDE 5 MG tablet Commonly  known as:  GLUCOTROL Take 0.5 tablets (2.5 mg total) by mouth daily before breakfast. What changed:  how much to take  when to take this   guaiFENesin 600 MG 12 hr tablet Commonly known as:  MUCINEX Take 600 mg by mouth 2 (two) times daily.   lisinopril 2.5 MG tablet Commonly known as:  PRINIVIL,ZESTRIL Take 1 tablet (2.5 mg total) by mouth daily. What changed:  Another medication with the same name was removed. Continue taking this medication, and follow the directions you see here.   loratadine 10 MG tablet Commonly known as:  CLARITIN Take 1 tablet (10 mg total) by mouth daily. What changed:  when to take this   omeprazole 20 MG capsule Commonly known as:  PRILOSEC Take 20 mg by mouth daily.   polyethylene glycol packet Commonly known as:  MIRALAX / GLYCOLAX Take 17 g by mouth every morning.   predniSONE 20 MG tablet Commonly known as:  DELTASONE Take 2 tablets (40 mg total) by mouth daily with breakfast.   sertraline 50 MG tablet Commonly known as:  ZOLOFT Take 75 mg by mouth daily.       Disposition and follow-up:   [name] was discharged from [facility] in Good condition.  At the hospital follow up visit please address:  1.  I would recommend the patient has follow-up with an allergist. She may also benefit from pulmonary function testing. Ensure she has finished her prednisone course.  2.  Labs /  imaging needed at time of follow-up: None  3.  Pending labs/ test needing follow-up: None  Follow-up Appointments: Follow-up [place] Follow up.   Why:  Call to schedule follow up appointment Contact information: [place] [address] [phone]       [facility] - Dme Follow up.   Why:  Nebulizer to be delivered to room prior to DC Contact information: [address] [place] [address] [place] Hospital Course by problem list: Principal Problem:   Asthma exacerbation Active Problems:   Asthma   Essential hypertension   Diabetes mellitus ([facility])   Back pain   1. Asthma exacerbation Patient presented to the [facility] emergency department on 05/22/2017 with acute respiratory failure thought to be secondary to asthma exacerbation versus anaphylactic reaction. In the emergency department the patient received epinephrine, diphenhydramine and famotidine. CTA was ordered to rule out PE which was negative. She was given albuterol, ipratropium, magnesium and methylprednisolone. Respiratory status improved. She was admitted to [facility]MT[facility] for management of asthma exacerbation. The patient says she gets several asthma exacerbations yearly. They typically occur in the early summer and may be caused by her seasonal allergies. Once inpatient her respiratory status greatly improved. She remained afebrile and hemodynamically stable. She was saturating 98-100% on room air. She will be discharged on a 4 day course of prednisone. We also provided an albuterol nebulizer in addition to her home Qvar.   2. Anaphylaxis? There is some concern at her presentation that the patient was in anaphylaxis prompting the emergency department provider to give her epinephrine, diphenhydramine and famotidine. It is unclear if she had anaphylaxis. However, I think her presentation was most likely secondary to severe asthma exacerbation. She does have severe seasonal allergies and I think she would greatly benefit from referral to allergist for testing.  Discharge Vitals:   BP 139/80 (BP Location: Right Arm)   Pulse 71   Temp 97.6 F (36.4 C) (Oral)   Resp 18   Ht 5\' 1"  (1.549 m)   Wt 78.1 kg (172 lb 1.6 oz)   SpO2 100%   BMI 32.52 kg/m   Pertinent Labs, Studies, and Procedures:  1. CT angiography chest-no evidence of acute pulmonary embolism or cardiopulmonary abnormality  Discharge Instructions: Discharge  Instructions    Call MD for:  difficulty breathing, headache or visual disturbances    Complete by:  As directed    Call MD for:  hives    Complete by:  As directed    Call MD for:  persistant dizziness or light-headedness    Complete by:  As directed    Call MD for:  persistant nausea and vomiting    Complete by:  As directed    Call MD for:  severe uncontrolled pain    Complete by:  As directed    Call MD for:  temperature >100.4    Complete by:  As directed    Diet - low sodium heart healthy    Complete by:  As directed    Diet - low sodium heart healthy    Complete by:  As directed    Discharge instructions    Complete by:  As directed    It was a pleasure taking care of you in the hospital. I think your breathing is doing great.  I have prescribed a new medication for  you called prednisone. You're to take 2 pills every morning for 4 days. This is a steroid and will help your asthma.  Please continue to take Qvar every day.  Please use albuterol nebulizer and inhaler as needed for shortness of breath.  The clinic will call you Monday to arrange an appointment to be seen. Please ensure you follow up with this visit.  If your breathing worsens and does not get better with the inhaler and nebulizer please return to the emergency department.   Discharge instructions    Complete by:  As directed    Assess asthma control and adherence to medications.   Increase activity slowly    Complete by:  As directed    Increase activity slowly    Complete by:  As directed       Signed: Ophelia Shoulder, MD 05/25/2017, 10:33 AM   Pager: [phone]

## 2017-05-23 NOTE — Progress Notes (Signed)
Discharged per MD order. IV removed, telemetry d/c, CCMD notified. Patient to home with family.  Cyndia Bent RN

## 2017-05-23 NOTE — Progress Notes (Addendum)
Patient family member believes his "secret" [employer] was stolen. Reported to charge nurse.  Cyndia Bent RN

## 2017-05-23 NOTE — Progress Notes (Signed)
   Subjective: No acute events overnight. Patient feeling much better since yesterday. She thinks she'll be ready to go home today. We'll reevaluate this afternoon to ensure her respiratory status is appropriate.  Objective:  Vital signs in last 24 hours: Vitals:   05/22/17 1715 05/22/17 1758 05/22/17 2056 05/23/17 0529  BP: 138/88 (!) 144/89 (!) 144/87 (!) 147/93  Pulse: (!) 103 90 81 66  Resp: (!) 23 18 18 18   Temp:  98.3 F (36.8 C) 97.8 F (36.6 C) 98.4 F (36.9 C)  TempSrc:  Oral Oral Oral  SpO2: 94% 100% 100% 100%  Weight:  78.1 kg (172 lb 1.6 oz)    Height:  5\' 1"  (1.549 m)    Physical Exam  Constitutional: She is oriented to person, place, and time. She appears well-developed and well-nourished.  HENT:  Head: Normocephalic and atraumatic.  Cardiovascular: Normal rate and regular rhythm.   No murmur heard. Respiratory: Effort normal and breath sounds normal. She has no wheezes.  Moving good air, some transmission of upper airway noises secondary to cough. No prominent expiratory wheeze appreciated.  GI: Soft. Bowel sounds are normal. She exhibits no distension. There is no tenderness.  Neurological: She is oriented to person, place, and time.      Assessment/Plan:  Principal Problem:   Asthma exacerbation Active Problems:   Asthma   Essential hypertension   Diabetes mellitus ([place])   Back pain  # Severe asthma exacerbation Patient presented yesterday afternoon with a severe asthma exacerbation. She improved following management in the emergency department. No acute events overnight. She has recovered well.Pulmonary examination is largely unremarkable. Moving good air. No prominent expiratory wheeze appreciated. We will plan to discharge her today with a five-day course of prednisone. She is also having some cough which I suspect is secondary to severe allergies. We'll also provide her with cough medication and expectorant for symptomatic management. We will also  arrange for her to have a nebulizer for her asthma medications. I will also recommend that she follow up with an allergist to ensure that her presentation was not secondary to some allergic reaction. -- DuoNeb q 6 hours until discharge  -- Albuterol when necessary -- Prednisone 40 mg 5 days -- Continue Q-var -- Loratadine 10 mg daily -- Guaifenesin 600 mg twice a day -- Chlorpheniramine-hydrocodone cough syrup twice a day 5 mL -- Follow-up with PCP  # Chronic medical conditions Continue treatment for chronic medical medical conditions upon discharge with home medication regimen.  Dispo: Anticipated discharge today.   Ophelia Shoulder, MD 05/23/2017, 8:16 AM Pager: [phone]

## 2017-05-23 NOTE — Progress Notes (Signed)
Discharge orders were written by Dr. Lovena Le, unfortunately patient has become unable to walk. Patient is shaking but no complaints of pain in legs. Patient walked hall earlier today with walker. Patient is tearful and is stating there is a demon in her named [name]. Family is very concerned, not wanting her to discharge. Paged MD, waiting for call back.  Cyndia Bent RN

## 2017-05-24 ENCOUNTER — Encounter (HOSPITAL_COMMUNITY): Payer: Self-pay

## 2017-05-24 LAB — GLUCOSE, CAPILLARY
Glucose-Capillary: 144 mg/dL — ABNORMAL HIGH (ref 65–99)
Glucose-Capillary: 182 mg/dL — ABNORMAL HIGH (ref 65–99)
Glucose-Capillary: 247 mg/dL — ABNORMAL HIGH (ref 65–99)

## 2017-05-24 MED ORDER — ALBUTEROL SULFATE (2.5 MG/3ML) 0.083% IN NEBU: 2.5000 mg | INHALATION_SOLUTION | RESPIRATORY_TRACT | 12 refills | Status: DC | PRN

## 2017-05-24 MED ORDER — DICLOFENAC SODIUM 1 % TD GEL: 2.0000 g | Freq: Four times a day (QID) | TRANSDERMAL | 0 refills | Status: DC

## 2017-05-24 MED ORDER — DICLOFENAC SODIUM 1 % TD GEL
2.0000 g | Freq: Four times a day (QID) | TRANSDERMAL | Status: DC
  Administered 2017-05-24 (×2): 2 g via TOPICAL
  Filled 2017-05-24: qty 100

## 2017-05-24 NOTE — Discharge Instructions (Signed)
Please continue to use your asthma inhalers, breathing treatments as prescribed. We have arranged to provide albuterol nebulizers as well. Please use these as needed.

## 2017-05-24 NOTE — Progress Notes (Signed)
  Date: 05/24/2017  Patient name: [name]  Medical record number: [id number]  Date of birth: 08/08/1966   I have seen and evaluated this patient and I have discussed the plan of care with the house staff. Please see Dr. Serita Grit upcoming note for complete details. I concur with his findings with the following additions/corrections:   Patient reported to me that her leg weakness has been ongoing and she is following with PT for this.  She is doing much better from a respiratory standpoint and can likely be discharged home if she is able to ambulate.   Sid Falcon, MD 05/24/2017, 2:13 PM
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
## 2017-05-24 NOTE — Progress Notes (Signed)
   Subjective:  Patient was to be discharged yesterday after improvement of her asthma exacerbation. She then displayed lower extremity weakness when attempting to ambulate. Per nursing note, patient and family stated there was a "demon named Angelita Ingles" in her. Patient and family were very concerned about her ability to ambulate and wanted to keep her overnight. This morning, patient appeared well communicating appropriately. She was on room air and denied any further shortness of breath. She was moving her extremities spontaneously in bed, however would limit lower extremity movement on exam. On further questioning, she does report chronic right knee pain and was seen in the ED 8 months ago for right knee pain after a fall.  Objective:  Vital signs in last 24 hours: Vitals:   05/23/17 1244 05/23/17 2137 05/24/17 0449 05/24/17 0722  BP:   (!) 156/90   Pulse:   (!) 59   Resp:   18   Temp:   98.7 F (37.1 C)   TempSrc:   Oral   SpO2: 99% 98% 95% 98%  Weight:      Height:      Physical Exam General: resting in bed, no acute distress Cardiac: RRR, no rubs, murmurs or gallops Pulm: clear to auscultation bilaterally, moving normal volumes of air, no wheezing Abd: soft, nontender, nondistended Ext: warm and well perfused, no pedal edema Neuro: alert and oriented X3, moving legs in bed spontaneously, raises both legs off bed against gravity but not against resistance, diminished plantar extension bilaterally, ? cooperation    Assessment/Plan:  Principal Problem:   Asthma exacerbation Active Problems:   Asthma   Essential hypertension   Diabetes mellitus ([facility])   Back pain  # Asthma exacerbation Her Asthma exacerbation has resolved, not requiring supplemental O2. We have arranged for her to have home albuterol nebulizers. She is stable for discharge from this standpoint. -- DuoNeb q 6 hours -- Albuterol when necessary -- Complete 5 day steroid course with Prednisone 40 mg (last dose  6/5) -- Continue Q-var -- Loratadine 10 mg daily -- Guaifenesin 600 mg twice a day -- Chlorpheniramine-hydrocodone cough syrup twice a day 5 mL -- Follow-up with PCP  Lower extremity weakness: "Developed" last night, as documented in Dr. Alcario Drought note. Her exam is inconsistent and further history elicits chronic right knee pain. We will try voltaren gel for pain and see how she ambulates. I suspect there may be secondary gain in wanting to remain in the hospital. I do not think her chronic knee pain warrants continued inpatient care. - Patient able to ambulate per nursing and can continue to follow up with PT outpatient  # Chronic medical conditions Continue treatment for chronic medical medical conditions upon discharge with home medication regimen.  Dispo: Anticipated discharge today.   Zada Finders, MD 05/24/2017, 12:33 PM

## 2017-05-24 NOTE — Progress Notes (Signed)
RT Note- Peak Flow Pre nebulizer 338ml                                   Post- 343ml

## 2017-05-25 MED FILL — ALBUTEROL 0.083% INHAL SOLN: (2.5 MG/3ML | 3 days supply | Qty: 90 | Fill #0

## 2017-05-25 MED FILL — ?PREDNISONE 20 MG TABLET: 20 | 4 days supply | Qty: 8 | Fill #0

## 2017-05-25 MED FILL — VOLTAREN 1% GEL: 1 | 25 days supply | Qty: 100 | Fill #0

## 2017-05-28 ENCOUNTER — Telehealth: Payer: Self-pay

## 2017-05-28 NOTE — Telephone Encounter (Signed)
Hospital TOC per dr Lovena Le, appt 06/01/2017 @ 2:15pm.

## 2017-05-28 NOTE — Telephone Encounter (Signed)
No answer, lm for rtc 

## 2017-06-01 ENCOUNTER — Ambulatory Visit: Payer: Self-pay

## 2017-06-04 NOTE — Telephone Encounter (Signed)
Lm for rtc 

## 2017-06-05 ENCOUNTER — Ambulatory Visit (INDEPENDENT_AMBULATORY_CARE_PROVIDER_SITE_OTHER): Payer: Self-pay | Admitting: Internal Medicine

## 2017-06-05 ENCOUNTER — Encounter: Payer: Self-pay | Admitting: Internal Medicine

## 2017-06-05 VITALS — BP 146/108 | HR 71 | Temp 98.1°F | Ht 61.5 in | Wt 182.2 lb

## 2017-06-05 DIAGNOSIS — Z5189 Encounter for other specified aftercare: Secondary | ICD-10-CM

## 2017-06-05 DIAGNOSIS — I10 Essential (primary) hypertension: Secondary | ICD-10-CM

## 2017-06-05 DIAGNOSIS — J45909 Unspecified asthma, uncomplicated: Secondary | ICD-10-CM

## 2017-06-05 DIAGNOSIS — Z87891 Personal history of nicotine dependence: Secondary | ICD-10-CM

## 2017-06-05 DIAGNOSIS — Z7951 Long term (current) use of inhaled steroids: Secondary | ICD-10-CM

## 2017-06-05 DIAGNOSIS — E1165 Type 2 diabetes mellitus with hyperglycemia: Secondary | ICD-10-CM

## 2017-06-05 MED ORDER — LISINOPRIL 5 MG PO TABS: 5.0000 mg | ORAL_TABLET | Freq: Every day | ORAL | 0 refills | Status: DC

## 2017-06-05 NOTE — Progress Notes (Signed)
Internal Medicine Clinic Attending  I saw and evaluated the patient.  I personally confirmed the key portions of the history and exam documented by Dr. Marlowe Sax and I reviewed pertinent patient test results.  The assessment, diagnosis, and plan were formulated together and I agree with the documentation in the resident's note. Pt started smoking 3 PPD age 51 and cont until about 10 yrs ago. Doesn't like to go outdoors June - Sept 2/2 allergic type sxs inc audible wheezing. The sxs leading to this hospitalization felt different then the sxs about 7-8 yrs ago when she presented with anxiety and got a new d/o asthma. Her sxs could be multiple things - allergies (seasonal variation), asthma (given dx yrs ago), COPD (tobacco hx), upper airway d/o (audiblle wheezing), or anxiety - or a mix of several etiologies. Agree with PFT's and referral.

## 2017-06-05 NOTE — Patient Instructions (Signed)
Ms. Hudman it was nice meeting you today.  -Continue using Qvar twice daily  -Continue using albuterol as needed  -Continue taking Claritin as instructed  -I have referred you for pulmonary function tests and allergy testing.

## 2017-06-05 NOTE — Assessment & Plan Note (Signed)
Assessment Current medication regimen includes lisinopril 2.5 mg daily. Initial blood pressure 158/106, repeat 146/108.  Plan -Increased dose of lisinopril to 5 mg daily -Return to the clinic in 1 month for blood pressure recheck

## 2017-06-05 NOTE — Progress Notes (Signed)
   CC: Patient is here for hospital follow-up of asthma exacerbation.Marland Kitchen  HPI:  [name] is a 51 y.o. female with a past medical history of conditions listed below presenting to the clinic for hospital follow-up of asthma exacerbation. Please see problem based charting for the status of the patient's current and chronic medical conditions.   Past Medical History:  Diagnosis Date  . Asthma   . Depression   . Hypertension     Review of Systems: Pertinent positives mentioned in HPI. Remainder of all ROS negative.   Physical Exam:  Vitals:   06/05/17 1005 06/05/17 1046  BP: (!) 158/106 (!) 146/108  Pulse: 66 71  Temp: 98.1 F (36.7 C)   SpO2: 100%   Weight: 182 lb 3.2 oz (82.6 kg)   Height: 5' 1.5" (1.562 m)    Physical Exam  Constitutional: She is oriented to person, place, and time. She appears well-developed and well-nourished. No distress.  HENT:  Mouth/Throat: Oropharynx is clear and moist.  Eyes: Right eye exhibits no discharge. Left eye exhibits no discharge.  Cardiovascular: Normal rate, regular rhythm and intact distal pulses.   Pulmonary/Chest: Effort normal and breath sounds normal. No respiratory distress. She has no wheezes. She has no rales.  Abdominal: Soft. Bowel sounds are normal. She exhibits no distension. There is no tenderness.  Musculoskeletal: She exhibits no edema.  Neurological: She is alert and oriented to person, place, and time.  Skin: Skin is warm and dry.    Assessment & Plan:   See Encounters Tab for problem based charting.  Patient discussed with Dr. Lynnae January

## 2017-06-05 NOTE — Assessment & Plan Note (Signed)
Assessment Patient was recently admitted to the hospital from 05/22/2017 to 05/24/2017 for an asthma exacerbation. Per documentation, patient presented to the hospital in extremis which initially required an epinephrine injection. She was then managed by the internal medicine teaching service for her asthma exacerbation and discharged home with a 4 day course of prednisone and albuterol nebulizer in addition to her previous home med Qvar. At present, patient reports feeling well and denies having any shortness of breath or wheezing. States she was initially diagnosed with asthma about 10 years ago when she presented to the ED with an "anxiety attack." Since then, she has had 2 more ED visits which led to hospitalizations for her asthma. Previous hospitalization was in 2017. Per patient, she has never been intubated. States prior to her recent hospitalization, she was requiring albuterol 3 times a day despite using her Qvar twice daily. She has now required albuterol only twice since her discharge on 05/24/2017 and continues to use Qvar. States her anxiety is well controlled with her current medication (Zoloft). She does report having seasonal allergies every year from June through September. States her allergies are well controlled with Claritin at present. She is a former smoker, quit 10 years ago. Patient was previously smoking 3 packs per day for 30+ years.  Plan  -PFTs -Referral for allergy testing -Continue Qvar -Continue albuterol when necessary

## 2017-06-09 NOTE — Telephone Encounter (Signed)
Lm for rtc 

## 2017-06-12 ENCOUNTER — Inpatient Hospital Stay (HOSPITAL_COMMUNITY)
Admission: EM | Admit: 2017-06-12 | Discharge: 2017-06-14 | DRG: 203 | Disposition: A | Discharge status: Home / Self Care | Payer: Self-pay | Attending: Internal Medicine | Admitting: Internal Medicine

## 2017-06-12 ENCOUNTER — Encounter (HOSPITAL_COMMUNITY): Payer: Self-pay | Admitting: Emergency Medicine

## 2017-06-12 ENCOUNTER — Emergency Department (HOSPITAL_COMMUNITY): Payer: Self-pay

## 2017-06-12 DIAGNOSIS — E876 Hypokalemia: Secondary | ICD-10-CM

## 2017-06-12 DIAGNOSIS — F1290 Cannabis use, unspecified, uncomplicated: Secondary | ICD-10-CM | POA: Insufficient documentation

## 2017-06-12 DIAGNOSIS — R0789 Other chest pain: Secondary | ICD-10-CM | POA: Diagnosis present

## 2017-06-12 DIAGNOSIS — F329 Major depressive disorder, single episode, unspecified: Secondary | ICD-10-CM | POA: Diagnosis present

## 2017-06-12 DIAGNOSIS — R531 Weakness: Secondary | ICD-10-CM | POA: Diagnosis present

## 2017-06-12 DIAGNOSIS — J4541 Moderate persistent asthma with (acute) exacerbation: Secondary | ICD-10-CM

## 2017-06-12 DIAGNOSIS — E1140 Type 2 diabetes mellitus with diabetic neuropathy, unspecified: Secondary | ICD-10-CM | POA: Diagnosis present

## 2017-06-12 DIAGNOSIS — Z87891 Personal history of nicotine dependence: Secondary | ICD-10-CM

## 2017-06-12 DIAGNOSIS — J45901 Unspecified asthma with (acute) exacerbation: Principal | ICD-10-CM | POA: Diagnosis present

## 2017-06-12 DIAGNOSIS — K219 Gastro-esophageal reflux disease without esophagitis: Secondary | ICD-10-CM | POA: Diagnosis present

## 2017-06-12 DIAGNOSIS — I10 Essential (primary) hypertension: Secondary | ICD-10-CM

## 2017-06-12 DIAGNOSIS — J45909 Unspecified asthma, uncomplicated: Secondary | ICD-10-CM

## 2017-06-12 DIAGNOSIS — E119 Type 2 diabetes mellitus without complications: Secondary | ICD-10-CM

## 2017-06-12 DIAGNOSIS — E1149 Type 2 diabetes mellitus with other diabetic neurological complication: Secondary | ICD-10-CM

## 2017-06-12 DIAGNOSIS — F419 Anxiety disorder, unspecified: Secondary | ICD-10-CM | POA: Diagnosis present

## 2017-06-12 DIAGNOSIS — Z79899 Other long term (current) drug therapy: Secondary | ICD-10-CM

## 2017-06-12 LAB — CBC
HCT: 40.4 % (ref 36.0–46.0)
Hemoglobin: 12.9 g/dL (ref 12.0–15.0)
MCH: 29.2 pg (ref 26.0–34.0)
MCHC: 31.9 g/dL (ref 30.0–36.0)
MCV: 91.4 fL (ref 78.0–100.0)
PLATELETS: 216 10*3/uL (ref 150–400)
RBC: 4.42 MIL/uL (ref 3.87–5.11)
RDW: 14.7 % (ref 11.5–15.5)
WBC: 7.2 10*3/uL (ref 4.0–10.5)

## 2017-06-12 LAB — HEPATIC FUNCTION PANEL
ALT: 30 U/L (ref 14–54)
AST: 37 U/L (ref 15–41)
Albumin: 4 g/dL (ref 3.5–5.0)
Alkaline Phosphatase: 115 U/L (ref 38–126)
BILIRUBIN DIRECT: 0.1 mg/dL (ref 0.1–0.5)
BILIRUBIN INDIRECT: 0.5 mg/dL (ref 0.3–0.9)
BILIRUBIN TOTAL: 0.6 mg/dL (ref 0.3–1.2)
Total Protein: 7.2 g/dL (ref 6.5–8.1)

## 2017-06-12 LAB — URINALYSIS, ROUTINE W REFLEX MICROSCOPIC
Bacteria, UA: NONE SEEN
Bilirubin Urine: NEGATIVE
Hgb urine dipstick: NEGATIVE
Ketones, ur: NEGATIVE mg/dL
Leukocytes, UA: NEGATIVE
Nitrite: NEGATIVE
PROTEIN: NEGATIVE mg/dL
RBC / HPF: NONE SEEN RBC/hpf (ref 0–5)
Specific Gravity, Urine: 1.03 (ref 1.005–1.030)
pH: 5 (ref 5.0–8.0)

## 2017-06-12 LAB — RAPID URINE DRUG SCREEN, HOSP PERFORMED
Amphetamines: NOT DETECTED
Barbiturates: NOT DETECTED
Benzodiazepines: NOT DETECTED
Cocaine: NOT DETECTED
OPIATES: POSITIVE — AB
TETRAHYDROCANNABINOL: POSITIVE — AB

## 2017-06-12 LAB — GLUCOSE, CAPILLARY
Glucose-Capillary: 361 mg/dL — ABNORMAL HIGH (ref 65–99)
Glucose-Capillary: 418 mg/dL — ABNORMAL HIGH (ref 65–99)

## 2017-06-12 LAB — BASIC METABOLIC PANEL
Anion gap: 8 (ref 5–15)
BUN: 13 mg/dL (ref 6–20)
CO2: 23 mmol/L (ref 22–32)
CREATININE: 0.76 mg/dL (ref 0.44–1.00)
Calcium: 8.9 mg/dL (ref 8.9–10.3)
Chloride: 110 mmol/L (ref 101–111)
GFR calc Af Amer: >60 mL/min (ref >60)
Glucose, Bld: 236 mg/dL — ABNORMAL HIGH (ref 65–99)
Potassium: 3.3 mmol/L — ABNORMAL LOW (ref 3.5–5.1)
Sodium: 141 mmol/L (ref 135–145)

## 2017-06-12 LAB — I-STAT TROPONIN, ED: Troponin i, poc: 0 ng/mL (ref 0.00–0.08)

## 2017-06-12 MED ORDER — INSULIN ASPART 100 UNIT/ML ~~LOC~~ SOLN
15.0000 [IU] | Freq: Once | SUBCUTANEOUS | Status: AC
  Administered 2017-06-12: 15 [IU] via SUBCUTANEOUS

## 2017-06-12 MED ORDER — ALBUTEROL (5 MG/ML) CONTINUOUS INHALATION SOLN
10.0000 mg/h | INHALATION_SOLUTION | Freq: Once | RESPIRATORY_TRACT | Status: AC
  Administered 2017-06-12: 10 mg/h via RESPIRATORY_TRACT

## 2017-06-12 MED ORDER — BUDESONIDE 0.25 MG/2ML IN SUSP
0.2500 mg | Freq: Two times a day (BID) | RESPIRATORY_TRACT | Status: DC
  Administered 2017-06-12 – 2017-06-14 (×4): 0.25 mg via RESPIRATORY_TRACT
  Filled 2017-06-12 (×4): qty 2

## 2017-06-12 MED ORDER — KETOROLAC TROMETHAMINE 15 MG/ML IJ SOLN
15.0000 mg | Freq: Once | INTRAMUSCULAR | Status: AC | PRN
  Administered 2017-06-13: 15 mg via INTRAVENOUS
  Filled 2017-06-12: qty 1

## 2017-06-12 MED ORDER — PANTOPRAZOLE SODIUM 40 MG PO TBEC
40.0000 mg | DELAYED_RELEASE_TABLET | Freq: Every day | ORAL | Status: DC
  Administered 2017-06-12 – 2017-06-14 (×3): 40 mg via ORAL
  Filled 2017-06-12 (×3): qty 1

## 2017-06-12 MED ORDER — MAGNESIUM SULFATE 2 GM/50ML IV SOLN
2.0000 g | Freq: Once | INTRAVENOUS | Status: AC
  Administered 2017-06-12: 2 g via INTRAVENOUS
  Filled 2017-06-12: qty 50

## 2017-06-12 MED ORDER — ACETAMINOPHEN 325 MG PO TABS
650.0000 mg | ORAL_TABLET | Freq: Four times a day (QID) | ORAL | Status: DC | PRN
  Administered 2017-06-13 (×2): 650 mg via ORAL
  Filled 2017-06-12 (×2): qty 2

## 2017-06-12 MED ORDER — ONDANSETRON HCL 4 MG/2ML IJ SOLN
4.0000 mg | Freq: Once | INTRAMUSCULAR | Status: AC
  Administered 2017-06-12: 4 mg via INTRAVENOUS
  Filled 2017-06-12: qty 2

## 2017-06-12 MED ORDER — HYDROCOD POLST-CPM POLST ER 10-8 MG/5ML PO SUER: 5.0000 mL | Freq: Two times a day (BID) | ORAL | Status: DC | PRN

## 2017-06-12 MED ORDER — IPRATROPIUM-ALBUTEROL 0.5-2.5 (3) MG/3ML IN SOLN
3.0000 mL | Freq: Four times a day (QID) | RESPIRATORY_TRACT | Status: DC
  Administered 2017-06-12 – 2017-06-13 (×4): 3 mL via RESPIRATORY_TRACT
  Filled 2017-06-12 (×4): qty 3

## 2017-06-12 MED ORDER — ENOXAPARIN SODIUM 40 MG/0.4ML ~~LOC~~ SOLN
40.0000 mg | SUBCUTANEOUS | Status: DC
  Administered 2017-06-12 – 2017-06-13 (×2): 40 mg via SUBCUTANEOUS
  Filled 2017-06-12 (×2): qty 0.4

## 2017-06-12 MED ORDER — GABAPENTIN 300 MG PO CAPS
300.0000 mg | ORAL_CAPSULE | Freq: Two times a day (BID) | ORAL | Status: DC
  Administered 2017-06-12 – 2017-06-14 (×4): 300 mg via ORAL
  Filled 2017-06-12 (×4): qty 1

## 2017-06-12 MED ORDER — POLYETHYLENE GLYCOL 3350 17 G PO PACK: 17.0000 g | PACK | Freq: Every day | ORAL | Status: DC | PRN

## 2017-06-12 MED ORDER — INSULIN ASPART 100 UNIT/ML ~~LOC~~ SOLN: 0.0000 [IU] | Freq: Every day | SUBCUTANEOUS | Status: DC

## 2017-06-12 MED ORDER — LISINOPRIL 5 MG PO TABS
5.0000 mg | ORAL_TABLET | Freq: Every day | ORAL | Status: DC
  Administered 2017-06-12 – 2017-06-14 (×3): 5 mg via ORAL
  Filled 2017-06-12 (×3): qty 1

## 2017-06-12 MED ORDER — SERTRALINE HCL 50 MG PO TABS
75.0000 mg | ORAL_TABLET | Freq: Every day | ORAL | Status: DC
  Administered 2017-06-12 – 2017-06-14 (×3): 75 mg via ORAL
  Filled 2017-06-12 (×3): qty 1

## 2017-06-12 MED ORDER — BECLOMETHASONE DIPROPIONATE 80 MCG/ACT IN AERS: 1.0000 | INHALATION_SPRAY | Freq: Two times a day (BID) | RESPIRATORY_TRACT | Status: DC

## 2017-06-12 MED ORDER — ALBUTEROL SULFATE (2.5 MG/3ML) 0.083% IN NEBU: 2.5000 mg | INHALATION_SOLUTION | RESPIRATORY_TRACT | Status: DC | PRN

## 2017-06-12 MED ORDER — ALBUTEROL (5 MG/ML) CONTINUOUS INHALATION SOLN: 10.0000 mg/h | INHALATION_SOLUTION | Freq: Once | RESPIRATORY_TRACT | Status: DC

## 2017-06-12 MED ORDER — FLUTICASONE PROPIONATE 50 MCG/ACT NA SUSP
2.0000 | Freq: Every day | NASAL | Status: DC
  Administered 2017-06-12 – 2017-06-14 (×3): 2 via NASAL
  Filled 2017-06-12: qty 16

## 2017-06-12 MED ORDER — HYDROMORPHONE HCL 1 MG/ML IJ SOLN
0.5000 mg | Freq: Once | INTRAMUSCULAR | Status: AC
  Administered 2017-06-12: 0.5 mg via INTRAVENOUS
  Filled 2017-06-12: qty 1

## 2017-06-12 MED ORDER — METHYLPREDNISOLONE SODIUM SUCC 125 MG IJ SOLR
125.0000 mg | Freq: Once | INTRAMUSCULAR | Status: AC
  Administered 2017-06-12: 125 mg via INTRAVENOUS
  Filled 2017-06-12: qty 2

## 2017-06-12 MED ORDER — SODIUM CHLORIDE 0.9 % IV SOLN
INTRAVENOUS | Status: DC
  Administered 2017-06-12: 21:00:00 via INTRAVENOUS

## 2017-06-12 MED ORDER — INSULIN ASPART 100 UNIT/ML ~~LOC~~ SOLN
0.0000 [IU] | Freq: Three times a day (TID) | SUBCUTANEOUS | Status: DC
  Administered 2017-06-13: 2 [IU] via SUBCUTANEOUS

## 2017-06-12 MED ORDER — ACETAMINOPHEN 650 MG RE SUPP: 650.0000 mg | Freq: Four times a day (QID) | RECTAL | Status: DC | PRN

## 2017-06-12 MED ORDER — ALBUTEROL (5 MG/ML) CONTINUOUS INHALATION SOLN
10.0000 mg/h | INHALATION_SOLUTION | Freq: Once | RESPIRATORY_TRACT | Status: AC
  Administered 2017-06-12: 10 mg/h via RESPIRATORY_TRACT
  Filled 2017-06-12: qty 20

## 2017-06-12 MED ORDER — POTASSIUM CHLORIDE CRYS ER 20 MEQ PO TBCR
40.0000 meq | EXTENDED_RELEASE_TABLET | Freq: Once | ORAL | Status: AC
  Administered 2017-06-12: 40 meq via ORAL
  Filled 2017-06-12: qty 2

## 2017-06-12 MED ORDER — PREDNISONE 20 MG PO TABS: 40.0000 mg | ORAL_TABLET | Freq: Every day | ORAL | 0 refills | Status: DC

## 2017-06-12 MED ORDER — LORATADINE 10 MG PO TABS
10.0000 mg | ORAL_TABLET | Freq: Every day | ORAL | Status: DC
  Administered 2017-06-12 – 2017-06-14 (×3): 10 mg via ORAL
  Filled 2017-06-12 (×3): qty 1

## 2017-06-12 NOTE — ED Notes (Signed)
Attempted to ambulate Pt, the Pt was shaking, extremely dizzy and Unable to walk.

## 2017-06-12 NOTE — H&P (Signed)
Date: 06/12/2017               Patient Name:  [name] MRN: [id number]  DOB: [birth year] Age / Sex: 51 y.o., female   PCP: Synthia Innocent Audrea Muscat, NP         Medical Service: Internal Medicine Teaching Service         Attending Physician: Dr. Davonna Belling, MD    First Contact: Dr. Jari Favre Pager: [phone]  Second Contact: Dr. Juleen China Pager: [phone]       After Hours (After 5p/  First Contact Pager: [phone]  weekends / holidays): Second Contact Pager: [phone]   Chief Complaint: difficulty breathing  History of Present Illness:  [name] is a 51 y.o. woman with PMHx of HTN, DM, asthma, anxiety, former tobacco use, and recent hospitalization for an asthma exacerbation.  She reports doing well post hospitalization until this morning when she woke up very short of breath, wheezing, and chest pain associated with her wheezing and cough.  She reports she tried to use her albuterol rescue inhaler and nebulizer plus her QVAR with no relief.  She then called EMS.  EMS gave her aspirin, albuterol, SL NTG, and morphine.  This helped her symptoms and relieved her pain.  In the ED, she received 2 continuous albuterol treatments, solumedrol, and magnesium.  She reports her wheezing and SOB is much better with treatment but that she now feels dizzy and unsteady with ambulation.  She also feels tremulous after medication administration.  ED tech notes that patient was shaking, extremely dizzy and unable to walk at 1625.  At 1700, notes state that she ambulated on pulse ox with o2 sats 99%, HR 160's and patient ambulated fine.  [facility] subsequently called for admission to manage her asthma exacerbation.  In the ED, labs notable for mild hypokalemia, normal CBC, negative I-stat troponin, UA with glucosuria, and UDS positive for opiates and THC.  CXR was negative and EKG with NSR, probable left atrial enlargement.  Meds:  Current Meds  Medication Sig  . albuterol (PROVENTIL HFA;VENTOLIN  HFA) 108 (90 Base) MCG/ACT inhaler Inhale 1-2 puffs into the lungs every 6 (six) hours as needed for wheezing or shortness of breath.  Marland Kitchen albuterol (PROVENTIL) (2.5 MG/3ML) 0.083% nebulizer solution Take 3 mLs (2.5 mg total) by nebulization every 2 (two) hours as needed for wheezing or shortness of breath.  . beclomethasone (QVAR) 80 MCG/ACT inhaler Inhale 1 puff into the lungs 2 (two) times daily.  . chlorpheniramine-HYDROcodone (TUSSIONEX) 10-8 MG/5ML SUER Take 5 mLs by mouth every 12 (twelve) hours.  . diclofenac sodium (VOLTAREN) 1 % GEL Apply 2 g topically 4 (four) times daily.  Marland Kitchen gabapentin (NEURONTIN) 300 MG capsule Take 1 capsule (300 mg total) by mouth 2 (two) times daily.  Marland Kitchen glipiZIDE (GLUCOTROL) 5 MG tablet Take 0.5 tablets (2.5 mg total) by mouth daily before breakfast. (Patient taking differently: Take 5 mg by mouth every morning. )  . guaiFENesin (MUCINEX) 600 MG 12 hr tablet Take 600 mg by mouth 2 (two) times daily.  Marland Kitchen lisinopril (PRINIVIL,ZESTRIL) 5 MG tablet Take 1 tablet (5 mg total) by mouth daily.  Marland Kitchen loratadine (CLARITIN) 10 MG tablet Take 1 tablet (10 mg total) by mouth daily.  Marland Kitchen omeprazole (PRILOSEC) 20 MG capsule Take 20 mg by mouth 2 (two) times daily.   . polyethylene glycol (MIRALAX / GLYCOLAX) packet Take 17 g by mouth every morning.  . sertraline (ZOLOFT) 50 MG tablet Take  75 mg by mouth daily.     Allergies: Allergies as of 06/12/2017  . (No Known Allergies)   Past Medical History:  Diagnosis Date  . Asthma   . Depression   . Hypertension     Family History: daughter with recent bronchitis last month, non-smoker.  No hx of MI or other lung disease  Social History: lives with daughter in [place] since hospitalization, otherwise in [place], former tobacco use  Review of Systems: A complete ROS was negative except as per HPI.   Physical Exam: Blood pressure 139/84, pulse (!) 137, temperature 97.5 F (36.4 C), temperature source Oral, resp. rate 19, height  5' 1.5" (1.562 m), weight 187 lb (84.8 kg), SpO2 96 %.  General: Patient is a well-developed and well-nourished, in no acute distress and cooperative with exam. She is lying in hospital bed with her grand-daughter Head: Normocephalic and atraumatic. Eyes: EOMI, conjunctivae normal, No scleral icterus.  Neck: Supple, normal ROM Cardiovascular: tachycardic, regular, no murmur Pulmonary/Chest: normal effort and air movement, no wheezes on exam. Abdominal: Soft, non-tender, non-distended, BS + Musculoskeletal: No joint deformities Extremities: No swelling or edema,  pulses symmetric and intact bilaterally Neurological: A&O x3, Strength is normal and symmetric bilaterally, cranial nerve II-XII are grossly intact, no focal motor deficit Skin: Warm, dry and intact. No rashes or erythema. Psychiatric: Normal mood and affect. speech and behavior is normal. Cognition and memory are normal.    EKG: rate 63, NSR, probable left atrial enlargement  CXR:  IMPRESSION: No edema or consolidation.  Assessment & Plan by Problem: Principal Problem:   Asthma exacerbation Active Problems:   Essential hypertension   Diabetes mellitus ([place])   Diabetic neuropathy ([place])   Hypokalemia   Marijuana use  # Ashtma Exacerbation # Chest Pain # Tachycardia She had recent hospitalization for asthma exacerbation possible triggered by seasonal allergies.  May also have an underlying anxiety component to her asthma but not entirely clear.  She recently was seen in clinic where she had been doing well post-hospitalization.  She was continued on her current regimen, referred to allergy testing, and ordered PFTs.  This has yet to be completed but was only ordered 1 week ago.  Her chest pain appears related to her asthma, cough, and wheezing.  She is currently CP free, with negative troponin and non-ischemic EKG.  Low suspicion for ACS.  CTA earlier this month was negative for PE.  Wells score PE is 1.5 for tachycardia  which is likely explained by albuterol tx.  Low risk for PE. - Duonebs Q6H scheduled - Albuterol nebs PRN - Continue loratidine - Add Flonase - Continue Qvar - Continue Tussionex for cough - PT eval - Oxygen as needed for comfort - Check peak flows  # Hypokalemia 3.3 on admission.  Replete and check labs in AM  # Anxiety - continue home Sertraline  # Diabetes Mellitus - Sliding scale with HS coverage - Continue Gabapentin  # HTN - Continue home lisinopril  # GERD - Continue home PPI  # FEN Fluids: NS @ 75 mL/hr Electrolytes: Replete as needed Nutrition: Carb mod  # DVT PPx: Lovenox  # CODE: FULL  Dispo: Admit patient to Observation with expected length of stay less than 2 midnights.  SignedJule Ser, DO 06/12/2017, 6:36 PM  Pager: [phone]

## 2017-06-12 NOTE — ED Notes (Signed)
Pt was ambulated while on pulse oximetry, O2 sats were 99 the entire time and Heart Rate 160-168. Pt was ambulating fine.

## 2017-06-12 NOTE — ED Provider Notes (Signed)
[facility] DEPT Provider Note   CSN: [id number] Arrival date & time: 06/12/17  7902     History   Chief Complaint Chief Complaint  Patient presents with  . Chest Pain  . Shortness of Breath    HPI [name] is a 51 y.o. female who presents emergency Department with chief complaint of shortness of breath. 2. The past medical history of poorly controlled asthma, diabetes. The patient was admitted for asthma exacerbation on June 1 and discharged 6/2. She states that this morning when she awoke she began coughing and this set off her asthma. She states that she took her inhaler inhaler and then a nebulizer treatment but did not improve. Her daughter called 911 and she was brought to the emergency department. She is complaining pain in the left upper chest wall, which is worse with coughing or deep breathing. She denies nausea, diaphoresis. She was given morphine on the ride over and thinks that she has bugs crawling on her skin. Her daughter states that she thinks the medicine made her hallucinate. She denies history of smoking.  HPI  Past Medical History:  Diagnosis Date  . Asthma   . Depression   . Hypertension     Patient Active Problem List   Diagnosis Date Noted  . Asthma exacerbation 05/22/2017  . Acute pain of right knee 10/14/2016  . Diabetic neuropathy ([place]) 01/07/2016  . Plantar fasciitis of left foot 11/26/2015  . Back pain 10/10/2015  . Asthma 09/29/2015  . Essential hypertension 09/29/2015  . Diabetes mellitus ([place]) 09/29/2015  . Chest pain 09/28/2015    Past Surgical History:  Procedure Laterality Date  . ABDOMINAL HYSTERECTOMY    . TUBAL LIGATION      OB History    No data available       Home Medications    Prior to Admission medications   Medication Sig Start Date End Date Taking? Authorizing Provider  albuterol (PROVENTIL HFA;VENTOLIN HFA) 108 (90 Base) MCG/ACT inhaler Inhale 1-2 puffs into the lungs every 6 (six) hours as needed for  wheezing or shortness of breath. 01/07/16   Arnoldo Morale, MD  albuterol (PROVENTIL) (2.5 MG/3ML) 0.083% nebulizer solution Take 3 mLs (2.5 mg total) by nebulization every 2 (two) hours as needed for wheezing or shortness of breath. 05/24/17   Zada Finders, MD  beclomethasone (QVAR) 80 MCG/ACT inhaler Inhale 1 puff into the lungs 2 (two) times daily. Patient not taking: Reported on 05/22/2017 01/07/16   Arnoldo Morale, MD  chlorpheniramine-HYDROcodone (TUSSIONEX) 10-8 MG/5ML SUER Take 5 mLs by mouth every 12 (twelve) hours. 05/23/17   Ophelia Shoulder, MD  diclofenac sodium (VOLTAREN) 1 % GEL Apply 2 g topically 4 (four) times daily. 05/24/17   Zada Finders, MD  gabapentin (NEURONTIN) 300 MG capsule Take 1 capsule (300 mg total) by mouth 2 (two) times daily. 01/07/16   Arnoldo Morale, MD  glipiZIDE (GLUCOTROL) 5 MG tablet Take 0.5 tablets (2.5 mg total) by mouth daily before breakfast. Patient taking differently: Take 5 mg by mouth every morning.  01/07/16   Arnoldo Morale, MD  guaiFENesin (MUCINEX) 600 MG 12 hr tablet Take 600 mg by mouth 2 (two) times daily.    [provider]  lisinopril (PRINIVIL,ZESTRIL) 5 MG tablet Take 1 tablet (5 mg total) by mouth daily. 06/05/17   Shela Leff, MD  loratadine (CLARITIN) 10 MG tablet Take 1 tablet (10 mg total) by mouth daily. 05/24/17   Ophelia Shoulder, MD  omeprazole (PRILOSEC) 20 MG capsule Take  20 mg by mouth daily.    [provider]  polyethylene glycol (MIRALAX / GLYCOLAX) packet Take 17 g by mouth every morning.    [provider]  sertraline (ZOLOFT) 50 MG tablet Take 75 mg by mouth daily.    [provider]    Family History No family history on file.  Social History Social History  Substance Use Topics  . Smoking status: Former Smoker    Types: Cigarettes    Quit date: 12/22/2004  . Smokeless tobacco: Never Used  . Alcohol use No     Allergies   Patient has no known allergies.   Review of Systems Review of  Systems  Ten systems reviewed and are negative for acute change, except as noted in the HPI.   Physical Exam Updated Vital Signs BP (!) 138/92   Pulse (!) 112   Temp 97.5 F (36.4 C) (Oral)   Resp 20   Ht 5' 1.5" (1.562 m)   Wt 84.8 kg (187 lb)   SpO2 96%   BMI 34.76 kg/m   Physical Exam  Constitutional: She is oriented to person, place, and time. She appears well-developed and well-nourished. No distress.  HENT:  Head: Normocephalic and atraumatic.  Eyes: Conjunctivae are normal. No scleral icterus.  Neck: Normal range of motion.  Cardiovascular: Normal rate, regular rhythm and normal heart sounds.  Exam reveals no gallop and no friction rub.   No murmur heard. Pulmonary/Chest: She has wheezes.  Poor air movement, minimal wheezes in the upper lung fields., Decreased lung sounds throughout the rest of the lung fields.  Abdominal: Soft. Bowel sounds are normal. She exhibits no distension and no mass. There is no tenderness. There is no guarding.  Neurological: She is alert and oriented to person, place, and time.  Skin: Skin is warm and dry. She is not diaphoretic.  Psychiatric: Her behavior is normal.  Nursing note and vitals reviewed.    ED Treatments / Results  Labs (all labs ordered are listed, but only abnormal results are displayed) Labs Reviewed  BASIC METABOLIC PANEL - Abnormal; Notable for the following:       Result Value   Potassium 3.3 (*)    Glucose, Bld 236 (*)    All other components within normal limits  URINALYSIS, ROUTINE W REFLEX MICROSCOPIC - Abnormal; Notable for the following:    Glucose, UA >=500 (*)    Squamous Epithelial / LPF 0-5 (*)    All other components within normal limits  RAPID URINE DRUG SCREEN, HOSP PERFORMED - Abnormal; Notable for the following:    Opiates POSITIVE (*)    Tetrahydrocannabinol POSITIVE (*)    All other components within normal limits  CBC  I-STAT TROPOININ, ED    EKG  EKG Interpretation  Date/Time:  Friday  June 12 2017 09:42:06 EDT Ventricular Rate:  63 PR Interval:    QRS Duration: 102 QT Interval:  428 QTC Calculation: 432 R Axis:   9 Text Interpretation:  Sinus rhythm Probable left atrial enlargement Confirmed by Alvino Chapel  MD, NATHAN [phone]) on 06/12/2017 9:44:28 AM       Radiology Dg Chest 2 View  Result Date: 06/12/2017 CLINICAL DATA:  Chest pain and shortness of breath.  Cough. EXAM: CHEST  2 VIEW COMPARISON:  Chest radiograph May 22, 2017 and chest CT May 22, 2017 FINDINGS: There is no appreciable edema or consolidation. The heart size and pulmonary vascularity are normal. No adenopathy. No pneumothorax. No bone lesions. IMPRESSION: No edema  or consolidation. Electronically Signed   By: Lowella Grip III M.D.   On: 06/12/2017 10:58    Procedures Procedures (including critical care time)  Medications Ordered in ED Medications  albuterol (PROVENTIL,VENTOLIN) solution continuous neb (10 mg/hr Nebulization Given 06/12/17 1156)  methylPREDNISolone sodium succinate (SOLU-MEDROL) 125 mg/2 mL injection 125 mg (125 mg Intravenous Given 06/12/17 1154)  ondansetron (ZOFRAN) injection 4 mg (4 mg Intravenous Given 06/12/17 1156)  magnesium sulfate IVPB 2 g 50 mL (0 g Intravenous Stopped 06/12/17 1313)  HYDROmorphone (DILAUDID) injection 0.5 mg (0.5 mg Intravenous Given 06/12/17 1313)  albuterol (PROVENTIL,VENTOLIN) solution continuous neb (10 mg/hr Nebulization Given 06/12/17 1437)     Initial Impression / Assessment and Plan / ED Course  I have reviewed the triage vital signs and the nursing notes.  Pertinent labs & imaging results that were available during my care of the patient were reviewed by me and considered in my medical decision making (see chart for details).  Clinical Course as of Jun 12 1604  Fri Jun 12, 2017  1230 Patient care movement improved. She continues to wheeze and does not feel back to baseline. Tremulous and tachycardic after administration of her albuterol. She  will give the patient a second treatment  [AH]  1310 Patient is tremulous and tachycardia. She is improved, however she continues to have some pain which is reproducible with palpation. The patientis negative for cocaine.  [AH]    Clinical Course User Index [AH] Margarita Mail, PA-C   Patient with some improvement, still feeling that her breathing is not at baseline. She has constant wheezing. The patient will ambulate in the ED with oxygen. I haver given sign out to Dr. Rene Kocher who will assume care of the patient and disposition appropriately after reassessment.   CRITICAL CARE Performed by: Margarita Mail Total critical care time: 70 minutes Critical care time was exclusive of separately billable procedures and treating other patients. Critical care was necessary to treat or prevent imminent or life-threatening deterioration. Critical care was time spent personally by me on the following activities: development of treatment plan with patient and/or surrogate as well as nursing, discussions with consultants, evaluation of patient's response to treatment, examination of patient, obtaining history from patient or surrogate, ordering and performing treatments and interventions, ordering and review of laboratory studies, ordering and review of radiographic studies, pulse oximetry and re-evaluation of patient's condition.   Final Clinical Impressions(s) / ED Diagnoses   Final diagnoses:  Moderate asthma with exacerbation, unspecified whether persistent    New Prescriptions New Prescriptions   No medications on file     Ned Grace 06/17/17 1518    Davonna Belling, MD 06/17/17 2303

## 2017-06-12 NOTE — ED Triage Notes (Signed)
Pt arrives from home via [facility] reporting CP with SOB, pt reports chronic cough more painful than usual.  EMS reports giving:   324 ASA 5 mg Albuterol NTG subling x 4 Total 6 mg Morphine  EMS reports pain went from 9/10 to 0/10. Pt reports no pain on arrival, denies SOB at this time.  Cough noted.

## 2017-06-12 NOTE — Discharge Instructions (Signed)
Your sugars will be elevated because of the medicine for your asthma.  Get help right away if: You have trouble breathing. You have a change in how you think, feel, or act (mental status). You feel sick to your stomach (nauseous), and that feeling does not go away. You cannot stop throwing up (vomiting). You are getting worse. You have trouble breathing. If severe, call your local emergency services (911 in the [place] You develop chest pain or discomfort. You are vomiting. You are not able to keep fluids down. You are coughing up yellow, green, brown, or bloody sputum. You have a fever and your symptoms suddenly get worse. You have trouble swallowing.

## 2017-06-12 NOTE — ED Notes (Signed)
Attempted to call report

## 2017-06-12 NOTE — ED Provider Notes (Signed)
Pt received from previous team.  50 yoF h/o asthma with recent admission who presents with coughing and poor air movement. Received 2x doses of continuous albuterol and magnesium. On multiple reassessments, patient continues to have mildly decreased air movement. She denies improvement of sx. Attempted to ambulate. However, pt only able to take several steps before becoming out of breath. Had negative CTA PE study on 05/22/17. Doubt interval development of PE. Tachycardia to 140s likely from albuterol. Continues to have slightly decreased air movement. Pt admitted for further management and evaluation. Pt stable at time of transfer.   Payton Emerald, MD 06/13/17 9012    Tanna Furry, MD 06/21/17 [phone]

## 2017-06-12 NOTE — ED Notes (Signed)
Patient transported to X-ray 

## 2017-06-13 LAB — GLUCOSE, CAPILLARY
GLUCOSE-CAPILLARY: 148 mg/dL — AB (ref 65–99)
GLUCOSE-CAPILLARY: 171 mg/dL — AB (ref 65–99)
GLUCOSE-CAPILLARY: 357 mg/dL — AB (ref 65–99)
Glucose-Capillary: 209 mg/dL — ABNORMAL HIGH (ref 65–99)

## 2017-06-13 LAB — BASIC METABOLIC PANEL
ANION GAP: 9 (ref 5–15)
BUN: 7 mg/dL (ref 6–20)
CALCIUM: 9.7 mg/dL (ref 8.9–10.3)
CHLORIDE: 112 mmol/L — AB (ref 101–111)
CO2: 18 mmol/L — AB (ref 22–32)
Creatinine, Ser: 0.86 mg/dL (ref 0.44–1.00)
GFR calc non Af Amer: >60 mL/min (ref >60)
GLUCOSE: 200 mg/dL — AB (ref 65–99)
POTASSIUM: 3.8 mmol/L (ref 3.5–5.1)
Sodium: 139 mmol/L (ref 135–145)

## 2017-06-13 LAB — CBC
HEMATOCRIT: 39.9 % (ref 36.0–46.0)
HEMOGLOBIN: 12.5 g/dL (ref 12.0–15.0)
MCH: 28.9 pg (ref 26.0–34.0)
MCHC: 31.3 g/dL (ref 30.0–36.0)
MCV: 92.1 fL (ref 78.0–100.0)
Platelets: 250 10*3/uL (ref 150–400)
RBC: 4.33 MIL/uL (ref 3.87–5.11)
RDW: 15.5 % (ref 11.5–15.5)
WBC: 14.4 10*3/uL — ABNORMAL HIGH (ref 4.0–10.5)

## 2017-06-13 MED ORDER — INSULIN ASPART 100 UNIT/ML ~~LOC~~ SOLN
0.0000 [IU] | Freq: Every day | SUBCUTANEOUS | Status: DC
  Administered 2017-06-13: 5 [IU] via SUBCUTANEOUS

## 2017-06-13 MED ORDER — HYDROCOD POLST-CPM POLST ER 10-8 MG/5ML PO SUER
5.0000 mL | Freq: Two times a day (BID) | ORAL | Status: DC
  Administered 2017-06-13 – 2017-06-14 (×3): 5 mL via ORAL
  Filled 2017-06-13 (×3): qty 5

## 2017-06-13 MED ORDER — PREDNISONE 20 MG PO TABS
40.0000 mg | ORAL_TABLET | Freq: Every day | ORAL | Status: DC
  Administered 2017-06-13 – 2017-06-14 (×2): 40 mg via ORAL
  Filled 2017-06-13 (×2): qty 2

## 2017-06-13 MED ORDER — IPRATROPIUM-ALBUTEROL 0.5-2.5 (3) MG/3ML IN SOLN
3.0000 mL | Freq: Three times a day (TID) | RESPIRATORY_TRACT | Status: DC
  Administered 2017-06-14: 3 mL via RESPIRATORY_TRACT
  Filled 2017-06-13: qty 3

## 2017-06-13 MED ORDER — DOCUSATE SODIUM 100 MG PO CAPS: 100.0000 mg | ORAL_CAPSULE | Freq: Every day | ORAL | Status: DC | PRN

## 2017-06-13 MED ORDER — INSULIN ASPART 100 UNIT/ML ~~LOC~~ SOLN
0.0000 [IU] | Freq: Three times a day (TID) | SUBCUTANEOUS | Status: DC
  Administered 2017-06-13: 3 [IU] via SUBCUTANEOUS
  Administered 2017-06-13: 5 [IU] via SUBCUTANEOUS
  Administered 2017-06-14 (×2): 4 [IU] via SUBCUTANEOUS
  Administered 2017-06-14: 15 [IU] via SUBCUTANEOUS

## 2017-06-13 MED ORDER — INSULIN ASPART 100 UNIT/ML ~~LOC~~ SOLN
5.0000 [IU] | Freq: Once | SUBCUTANEOUS | Status: AC
  Administered 2017-06-13: 5 [IU] via SUBCUTANEOUS

## 2017-06-13 NOTE — Evaluation (Addendum)
Physical Therapy Evaluation Patient Details Name: [name] MRN: [id number] DOB: Feb 26, 1966 Today's Date: 06/13/2017   History of Present Illness  Pt is a 51 y.o. female with PMH of HTN, DM, asthma, anxiety, chronic LE weakness and previous smoking. She presented to the ED with c/o breathing issues.   Clinical Impression  Pt admitted with above diagnosis. Pt currently with functional limitations due to the deficits listed below (see PT Problem List). On eval, pt required min guard assist transfers and gait 15 feet x 2 with rollator. Pt needed seated rest break on rollator after 15 feet. See below for further eval details. Pt will benefit from skilled PT to increase their independence and safety with mobility to allow discharge to the venue listed below.       Follow Up Recommendations Outpatient PT (Pt receiving OPPT services PTA.)    Equipment Recommendations  Wheelchair cushion (measurements PT);Wheelchair (measurements PT)    Recommendations for Other Services       Precautions / Restrictions Precautions Precautions: Fall      Mobility  Bed Mobility Overal bed mobility: Modified Independent             General bed mobility comments: +rail, HOB elevated  Transfers Overall transfer level: Needs assistance Equipment used: 4-wheeled walker Transfers: Sit to/from [facility] Sit to Stand: Min guard Stand pivot transfers: Min guard       General transfer comment: increased time to complete, increased time to stabilize initial standing balance  Ambulation/Gait Ambulation/Gait assistance: Min guard Ambulation Distance (Feet): 15 Feet (x 2) Assistive device: 4-wheeled walker Gait Pattern/deviations: Step-through pattern;Trunk flexed Gait velocity: very slow Gait velocity interpretation: Below normal speed for age/gender General Gait Details: Pt very fearful of falling. Hopping/jerking movement during ambulation. Gait distance limited by  fatigue.  Stairs            Wheelchair Mobility    Modified Rankin (Stroke Patients Only)       Balance Overall balance assessment: Needs assistance Sitting-balance support: No upper extremity supported;Feet supported Sitting balance-Leahy Scale: Good     Standing balance support: Bilateral upper extremity supported;During functional activity Standing balance-Leahy Scale: Fair Standing balance comment: Rollator needed for balance.                             Pertinent Vitals/Pain Pain Assessment: No/denies pain    Home Living Family/patient expects to be discharged to:: Private residence Living Arrangements: Children Available Help at Discharge: Family;Available 24 hours/day Type of Home: Apartment Home Access: Level entry     Home Layout: One level Home Equipment: Walker - 2 wheels;Walker - 4 wheels;Cane - single point      Prior Function Level of Independence: Independent with assistive device(s)         Comments: Pt prefers to use her rollator. Ambulates household distances.      Hand Dominance        Extremity/Trunk Assessment   Upper Extremity Assessment Upper Extremity Assessment: Overall WFL for tasks assessed    Lower Extremity Assessment Lower Extremity Assessment: Overall WFL for tasks assessed    Cervical / Trunk Assessment Cervical / Trunk Assessment: Normal  Communication   Communication: No difficulties  Cognition Arousal/Alertness: Awake/alert Behavior During Therapy: WFL for tasks assessed/performed;Anxious Overall Cognitive Status: Within Functional Limits for tasks assessed  General Comments      Exercises     Assessment/Plan    PT Assessment Patient needs continued PT services  PT Problem List Decreased activity tolerance;Decreased balance;Decreased mobility       PT Treatment Interventions Gait training;Functional mobility training;Therapeutic  activities;Therapeutic exercise;Balance training;Patient/family education    PT Goals (Current goals can be found in the Care Plan section)  Acute Rehab PT Goals Patient Stated Goal: stop falling PT Goal Formulation: With patient/family Time For Goal Achievement: 06/20/17 Potential to Achieve Goals: Fair    Frequency Min 3X/week   Barriers to discharge        Co-evaluation               AM-PAC PT "6 Clicks" Daily Activity  Outcome Measure Difficulty turning over in bed (including adjusting bedclothes, sheets and blankets)?: None Difficulty moving from lying on back to sitting on the side of the bed? : A Little Difficulty sitting down on and standing up from a chair with arms (e.g., wheelchair, bedside commode, etc,.)?: A Little Help needed moving to and from a bed to chair (including a wheelchair)?: A Little Help needed walking in hospital room?: A Little Help needed climbing 3-5 steps with a railing? : A Lot 6 Click Score: 18    End of Session Equipment Utilized During Treatment: Gait belt Activity Tolerance: Patient tolerated treatment well Patient left: in bed;with call bell/phone within reach;with family/visitor present Nurse Communication: Mobility status PT Visit Diagnosis: Unsteadiness on feet (R26.81);History of falling (Z91.81)    Time: 7867-6720 PT Time Calculation (min) (ACUTE ONLY): 13 min   Charges:   PT Evaluation $PT Eval Moderate Complexity: 1 Procedure     PT G Codes:   PT G-Codes **NOT FOR INPATIENT CLASS** Functional Assessment Tool Used: AM-PAC 6 Clicks Basic Mobility Functional Limitation: Mobility: Walking and moving around Mobility: Walking and Moving Around Current Status (N4709): At least 40 percent but less than 60 percent impaired, limited or restricted Mobility: Walking and Moving Around Goal Status [phone]): At least 20 percent but less than 40 percent impaired, limited or restricted    Lorrin Goodell, PT  Office # [phone] Pager  [phone]   Lorriane Shire 06/13/2017, 12:17 PM

## 2017-06-13 NOTE — Progress Notes (Signed)
Admitted pt.AAO X4 ,noresp.distress noted.V/S taken & recorded.IV in place on LT AC,no signs of infilt.noted.Oriented pt.to room ,call bell.Fall assessment completed w/ pt.& able to verbalize understanding of risks associated w/ falls.Call light w/ in reach.Skin dry & intact.Will cont.to monitor pt.

## 2017-06-13 NOTE — Progress Notes (Signed)
   Subjective:  Patient seen and examined this morning.  No acute events noted overnight.  However, patient reports that she nearly fell due to weak legs but her daughter and RN caught her.  She complains of cough and headache this morning.  Objective:  Vital signs in last 24 hours: Vitals:   06/13/17 0138 06/13/17 0501 06/13/17 0818 06/13/17 0857  BP:  128/90 133/88   Pulse: (!) 104 (!) 116    Resp: 18 16    Temp:  98.5 F (36.9 C)    TempSrc:      SpO2: 97% 100%  99%  Weight:      Height:       General: resting in bed, bed sheet pulled over her head, easily arousable, pleasant, NAD HEENT: NCAT, EOMI, no scleral icterus Cardiac: Tachycardic, regular, no rubs, murmurs or gallops Pulm: coughing intermittently, poor inspiratory effort, anterior wheezes, no increased WOB Abd: soft, nontender, nondistended, BS present Ext: warm and well perfused, no pedal edema MSK: LE strength intact, ? Mild foot drop on the left Neuro: alert and oriented X3, cranial nerves II-XII grossly intact   Assessment/Plan:  # Ashtma Exacerbation # Cough, Headache Main complaint today appears to be around her cough and headache.  She does not appear to have any respiratory distress on exam and reports that her breathing is improving. - Duonebs Q6H scheduled - Albuterol nebs PRN - Continue loratidine - Continue Flonase - Continue Pulmicort (formulary substitution for QVar) - Continue Tussionex for cough.  Will schedule for the next 24 hours to help control symptoms. - Oxygen as needed for comfort - Add Prednisone 40mg  daily  # Leg Weakness She reports almost falling last night.  She has had some leg weakness as reported during last hospitalization and is following with PT for this outpatient. - PT eval here  # Hypokalemia 3.8 this morning  # Anxiety - continue home Sertraline  # Diabetes Mellitus - Sliding scale with HS coverage.  Will change to resistant due to steroids - Continue  Gabapentin  # HTN - Continue home lisinopril  # FEN Fluids: NSL Electrolytes: Replete as needed Nutrition: Carb mod  # DVT PPx: Lovenox  # CODE: FULL  Dispo: Anticipated discharge in approximately 1-2 day(s).   Jule Ser, DO 06/13/2017, 10:59 AM Pager: [phone]

## 2017-06-14 LAB — GLUCOSE, CAPILLARY
GLUCOSE-CAPILLARY: 176 mg/dL — AB (ref 65–99)
Glucose-Capillary: 166 mg/dL — ABNORMAL HIGH (ref 65–99)
Glucose-Capillary: 306 mg/dL — ABNORMAL HIGH (ref 65–99)

## 2017-06-14 LAB — BASIC METABOLIC PANEL
ANION GAP: 6 (ref 5–15)
BUN: 10 mg/dL (ref 6–20)
CALCIUM: 9.7 mg/dL (ref 8.9–10.3)
CO2: 24 mmol/L (ref 22–32)
Chloride: 109 mmol/L (ref 101–111)
Creatinine, Ser: 0.87 mg/dL (ref 0.44–1.00)
GLUCOSE: 182 mg/dL — AB (ref 65–99)
POTASSIUM: 4.2 mmol/L (ref 3.5–5.1)
SODIUM: 139 mmol/L (ref 135–145)

## 2017-06-14 LAB — CBC
HCT: 39.1 % (ref 36.0–46.0)
Hemoglobin: 12.3 g/dL (ref 12.0–15.0)
MCH: 28.9 pg (ref 26.0–34.0)
MCHC: 31.5 g/dL (ref 30.0–36.0)
MCV: 91.8 fL (ref 78.0–100.0)
PLATELETS: 240 10*3/uL (ref 150–400)
RBC: 4.26 MIL/uL (ref 3.87–5.11)
RDW: 15.5 % (ref 11.5–15.5)
WBC: 12.6 10*3/uL — AB (ref 4.0–10.5)

## 2017-06-14 MED ORDER — DICLOFENAC SODIUM 1 % TD GEL
2.0000 g | Freq: Four times a day (QID) | TRANSDERMAL | 0 refills | Status: DC
Start: 2017-06-14 — End: 2019-01-28

## 2017-06-14 MED ORDER — OMEPRAZOLE 20 MG PO CPDR: 40.0000 mg | DELAYED_RELEASE_CAPSULE | Freq: Every day | ORAL | 2 refills | Status: DC

## 2017-06-14 MED ORDER — FLUTICASONE PROPIONATE 50 MCG/ACT NA SUSP: 2.0000 | Freq: Every day | NASAL | 2 refills | Status: DC

## 2017-06-14 MED ORDER — DICLOFENAC SODIUM 1 % TD GEL
4.0000 g | Freq: Four times a day (QID) | TRANSDERMAL | Status: DC
  Administered 2017-06-14: 14:00:00 4 g via TOPICAL
  Filled 2017-06-14: qty 100

## 2017-06-14 MED ORDER — PREDNISONE 20 MG PO TABS: 40.0000 mg | ORAL_TABLET | Freq: Every day | ORAL | 0 refills | Status: AC

## 2017-06-14 NOTE — Progress Notes (Signed)
[name] to be D/C'd home per MD order.  Discussed with the patient and all questions fully answered.  VSS, Skin clean, dry and intact without evidence of skin break down, no evidence of skin tears noted. IV catheter discontinued intact. Site without signs and symptoms of complications. Dressing and pressure applied.  An After Visit Summary was printed and given to the patient. Patient received prescription.  D/c education completed with patient/family including follow up instructions, medication list, d/c activities limitations if indicated, with other d/c instructions as indicated by MD - patient able to verbalize understanding, all questions fully answered.   Patient instructed to return to ED, call 911, or call MD for any changes in condition.   Patient escorted via [place], and D/C home via private auto.  Milas Hock 06/14/2017 6:41 PM

## 2017-06-14 NOTE — Discharge Summary (Signed)
Name: [name] MRN: [id number] DOB: Jun 01, 1966 51 y.o. PCP: Marliss Coots, NP  Date of Admission: 06/12/2017  9:37 AM Date of Discharge: 06/14/2017 Attending Physician: Sid Falcon, MD  Discharge Diagnosis: 1. Asthma exacerbation Principal Problem:   Asthma exacerbation Active Problems:   Essential hypertension   Diabetes mellitus ([place])   Diabetic neuropathy ([place])   Hypokalemia  Discharge Medications: Allergies as of 06/14/2017   No Known Allergies     Medication List    TAKE these medications   albuterol 108 (90 Base) MCG/ACT inhaler Commonly known as:  PROVENTIL HFA;VENTOLIN HFA Inhale 1-2 puffs into the lungs every 6 (six) hours as needed for wheezing or shortness of breath.   albuterol (2.5 MG/3ML) 0.083% nebulizer solution Commonly known as:  PROVENTIL Take 3 mLs (2.5 mg total) by nebulization every 2 (two) hours as needed for wheezing or shortness of breath.   beclomethasone 80 MCG/ACT inhaler Commonly known as:  QVAR Inhale 1 puff into the lungs 2 (two) times daily.   chlorpheniramine-HYDROcodone 10-8 MG/5ML Suer Commonly known as:  TUSSIONEX Take 5 mLs by mouth every 12 (twelve) hours.   diclofenac sodium 1 % Gel Commonly known as:  VOLTAREN Apply 2 g topically 4 (four) times daily.   fluticasone 50 MCG/ACT nasal spray Commonly known as:  FLONASE Place 2 sprays into both nostrils daily. Start taking on:  06/15/2017   gabapentin 300 MG capsule Commonly known as:  NEURONTIN Take 1 capsule (300 mg total) by mouth 2 (two) times daily.   glipiZIDE 5 MG tablet Commonly known as:  GLUCOTROL Take 0.5 tablets (2.5 mg total) by mouth daily before breakfast. What changed:  how much to take  when to take this   guaiFENesin 600 MG 12 hr tablet Commonly known as:  MUCINEX Take 600 mg by mouth 2 (two) times daily.   lisinopril 5 MG tablet Commonly known as:  PRINIVIL,ZESTRIL Take 1 tablet (5 mg total) by mouth daily.   loratadine 10 MG  tablet Commonly known as:  CLARITIN Take 1 tablet (10 mg total) by mouth daily.   omeprazole 20 MG capsule Commonly known as:  PRILOSEC Take 2 capsules (40 mg total) by mouth daily. What changed:  how much to take  when to take this   polyethylene glycol packet Commonly known as:  MIRALAX / GLYCOLAX Take 17 g by mouth every morning.   predniSONE 20 MG tablet Commonly known as:  DELTASONE Take 2 tablets (40 mg total) by mouth daily.   sertraline 50 MG tablet Commonly known as:  ZOLOFT Take 75 mg by mouth daily.            Durable Medical Equipment        Start     Ordered   06/14/17 1410  For home use only DME wheelchair cushion (seat and back)  Once     06/14/17 1409      Disposition and follow-up:   [name] was discharged from [facility] in Stable condition.  At the hospital follow up visit please address:  1.   Asthma: --prednisone 40mg  daily until 6/28 --Qvar BID, albuterol neb/inh PRN --flonase and claritin for allergic symptom control --recommend allergy consult outpatient --had order for PFTs following prior admission  Outpatient PT for LE weakness. PT recommended wheelchair - order written prior to discharge.   2.  Labs / imaging needed at time of follow-up: none  3.  Pending labs/ test needing follow-up: none  Follow-up Appointments: Follow-up Information    Placey, Audrea Muscat, NP. Go on 06/15/2017.   Contact information: [place] [address] [phone]          Hospital Course by problem list: Principal Problem:   Asthma exacerbation Active Problems:   Essential hypertension   Diabetes mellitus ([place])   Diabetic neuropathy ([place])   Hypokalemia   Asthma exacerbation Musculoskeletal Chest Pain: Patient with h/o asthma with frequent exacerbations and recent hospitalization for same earlier this month presented to [facility] after waking up with shortness of breath, wheezing and chest pain  with coughing that was incompletely relieved with Qvar and albuterol. On admission, she was afebrile, CXR did not show consolidation or infiltrate. Her symptoms began improving with IV solumedrol, albuterol, magnesium sulfate, and morphine. During admission, she was treated with prednisone, pulmicort neb, duoneb and albuterol and continued to improve. She was started on Flonase and claritin to curtail any allergic component that was exacerbating her symptoms. Her chest pain continued with cough and was reproducible on palpation; it was relieved with application of voltaren gel. She was discharged with 4 more days of prednisone 40mg  daily (for total course of 7 days), continued on Qvar, albuterol, flonase and claritin.   Hypokalemia: Repleted adequately with oral potassium.  Anxiety: Patient was continued on home sertraline 75mg  daily.  DM: Patient's home regimen of glipizide was held during admission and her glucose controlled with sliding scale. Glipizide was restarted on discharge.  HTN: Patient was continued on her home lisinopril 5mg  daily.  Generalized weakness: Patient with chronic LE and generalized weakness; at previous admission was recommended for outpatient PT. They re-evaluated her and again placed same recommendation. Referral was placed at earlier hospitalization. She was also provided wheelchair per PT recommendations.   Discharge Vitals:   BP (!) 143/83 (BP Location: Right Arm)   Pulse 68   Temp 98.6 F (37 C) (Oral)   Resp 14   Ht 5' 0.5" (1.537 m)   Wt 185 lb 6.4 oz (84.1 kg)   SpO2 100%   BMI 35.61 kg/m   Pertinent Labs, Studies, and Procedures:  CBC Latest Ref Rng & Units 06/14/2017 06/13/2017 06/12/2017  WBC 4.0 - 10.5 K/uL 12.6(H) 14.4(H) 7.2  Hemoglobin 12.0 - 15.0 g/dL 12.3 12.5 12.9  Hematocrit 36.0 - 46.0 % 39.1 39.9 40.4  Platelets 150 - 400 K/uL 240 250 216   BMP Latest Ref Rng & Units 06/14/2017 06/13/2017 06/12/2017  Glucose 65 - 99 mg/dL 182(H) 200(H)  236(H)  BUN 6 - 20 mg/dL 10 7 13   Creatinine 0.44 - 1.00 mg/dL 0.87 0.86 0.76  Sodium 135 - 145 mmol/L 139 139 141  Potassium 3.5 - 5.1 mmol/L 4.2 3.8 3.3(L)  Chloride 101 - 111 mmol/L 109 112(H) 110  CO2 22 - 32 mmol/L 24 18(L) 23  Calcium 8.9 - 10.3 mg/dL 9.7 9.7 8.9   iStat troponin negative CXR no edema or consolidation  Discharge Instructions: Discharge Instructions    Call MD for:  difficulty breathing, headache or visual disturbances    Complete by:  As directed    Call MD for:  extreme fatigue    Complete by:  As directed    Call MD for:  hives    Complete by:  As directed    Call MD for:  persistant dizziness or light-headedness    Complete by:  As directed    Call MD for:  persistant nausea and vomiting    Complete by:  As directed    Call MD for:  redness, tenderness, or signs of infection (pain, swelling, redness, odor or green/yellow discharge around incision site)    Complete by:  As directed    Call MD for:  severe uncontrolled pain    Complete by:  As directed    Call MD for:  temperature >100.4    Complete by:  As directed    Diet - low sodium heart healthy    Complete by:  As directed    Discharge instructions    Complete by:  As directed    For your asthma exacerbation: Take prednisone 40mg  with breakfast (2 tabs) for 4 more days. Continue using the Qvar twice a day and you albuterol nebulizer as needed  For allergies as a possible trigger for your exacerbations: --use flonase nasal steroid every day --Take claritin or another over the counter antihistamine daily  For your chest pain: --place voltaren gel up to 4 times a day on the area that is painful  Please follow up with your primary care provider in the morning. At your last hospitalization, we recommended that you see an allergist, so you can ask for a referral at your appointment.   Increase activity slowly    Complete by:  As directed      Signed: Alphonzo Grieve, MD 06/14/2017, 5:47 PM     Pager [phone]

## 2017-06-14 NOTE — Progress Notes (Signed)
   Subjective:  Patient states she is doing better today; she continues to have improvement in her breathing after each breathing treatment. She endorses left sided, upper chest pain with cough that is tender to palpation. She continues to endorse a nonproductive cough.  Objective:  Vital signs in last 24 hours: Vitals:   06/13/17 2135 06/14/17 0526 06/14/17 0745 06/14/17 0943  BP: (!) 146/89 135/73  128/80  Pulse: 84 71    Resp: 20 20    Temp: 98.5 F (36.9 C) 98.6 F (37 C)    TempSrc: Oral Oral    SpO2: 98% 100% 99%   Weight:      Height:       Constitutional: NAD, lying in bed comfortably CV: RRR, no murmurs, rubs or gallops appreciated, no LE edema Resp: CTAB, no increased work of breathing, no rales or wheezes Abd: Soft, NDNT, +BS Msk: reproducible left sided chest pain  Assessment/Plan:  Principal Problem:   Asthma exacerbation Active Problems:   Essential hypertension   Diabetes mellitus ([facility])   Diabetic neuropathy ([facility])   Hypokalemia  Asthma exacerbation Musculoskeletal chest pain: Patient with some improvement today, though still having nonproductive cough. Her chest pain appears musculoskeletal in nature as it is reproducible and occurs with cough.  --continue duonebs scheduled, albuterol PRN --continue home Qvar --loratidine and flonase for allergic symptom control --continue tussionex for cough --voltaren gel for chest pain --will re-eval this afternoon for possible discharge  Hypokalemia: Resolved this AM after repletion.   Anxiety: --continue home sertraline  DM: Home regimen includes glipizide. --SSI-R wc/hs --continue home gabapentin for neuropathy  HTN: Continue home lisinopril  GERD: Continue home PPI  Dispo: Anticipated discharge possibly today or tomorrow.   Addendum: Patient evaluated at bedside this afternoon. She states her chest pain has significantly improved with voltaren gel. She feels her breathing is even better than this  morning. She is in agreement with discharge today and has a f/u appointment with her PCP tomorrow morning in [facility].  Alphonzo Grieve, MD 06/14/2017, 2:09 PM Pager [phone]

## 2017-06-15 ENCOUNTER — Ambulatory Visit (HOSPITAL_COMMUNITY)
Admission: RE | Admit: 2017-06-15 | Discharge: 2017-06-15 | Disposition: A | Discharge status: Home / Self Care | Payer: Self-pay | Source: Ambulatory Visit | Attending: Internal Medicine | Admitting: Internal Medicine

## 2017-06-15 DIAGNOSIS — J45909 Unspecified asthma, uncomplicated: Secondary | ICD-10-CM | POA: Insufficient documentation

## 2017-06-15 LAB — PULMONARY FUNCTION TEST
DL/VA % PRED: 90 %
DL/VA: 3.96 ml/min/mmHg/L
DLCO cor % pred: 72 %
DLCO cor: 14.64 ml/min/mmHg
DLCO unc % pred: 69 %
DLCO unc: 14.12 ml/min/mmHg
FEF 25-75 PRE: 1.95 L/s
FEF 25-75 Post: 2.32 L/sec
FEF2575-%Change-Post: 19 %
FEF2575-%PRED-POST: 104 %
FEF2575-%Pred-Pre: 87 %
FEV1-%CHANGE-POST: 6 %
FEV1-%PRED-PRE: 88 %
FEV1-%Pred-Post: 94 %
FEV1-Post: 1.94 L
FEV1-Pre: 1.82 L
FEV1FVC-%Change-Post: 5 %
FEV1FVC-%Pred-Pre: 100 %
FEV6-%Change-Post: 0 %
FEV6-%PRED-POST: 90 %
FEV6-%Pred-Pre: 90 %
FEV6-Post: 2.25 L
FEV6-Pre: 2.23 L
FEV6FVC-%PRED-PRE: 103 %
FEV6FVC-%Pred-Post: 103 %
FVC-%Change-Post: 0 %
FVC-%PRED-PRE: 87 %
FVC-%Pred-Post: 87 %
FVC-POST: 2.25 L
FVC-PRE: 2.23 L
PRE FEV6/FVC RATIO: 100 %
Post FEV1/FVC ratio: 86 %
Post FEV6/FVC ratio: 100 %
Pre FEV1/FVC ratio: 81 %
RV % pred: 179 %
RV: 2.97 L
TLC % pred: 118 %
TLC: 5.44 L

## 2017-06-15 MED ORDER — ALBUTEROL SULFATE (2.5 MG/3ML) 0.083% IN NEBU
2.5000 mg | INHALATION_SOLUTION | Freq: Once | RESPIRATORY_TRACT | Status: AC
  Administered 2017-06-15: 2.5 mg via RESPIRATORY_TRACT

## 2017-06-17 ENCOUNTER — Ambulatory Visit: Payer: Self-pay

## 2017-06-24 ENCOUNTER — Encounter (HOSPITAL_COMMUNITY): Payer: Self-pay | Admitting: Emergency Medicine

## 2017-06-24 ENCOUNTER — Emergency Department (HOSPITAL_COMMUNITY): Payer: Self-pay

## 2017-06-24 ENCOUNTER — Emergency Department (HOSPITAL_COMMUNITY)
Admission: EM | Admit: 2017-06-24 | Discharge: 2017-06-25 | Disposition: A | Discharge status: Home / Self Care | Payer: Self-pay | Attending: Emergency Medicine | Admitting: Emergency Medicine

## 2017-06-24 DIAGNOSIS — E1140 Type 2 diabetes mellitus with diabetic neuropathy, unspecified: Secondary | ICD-10-CM | POA: Insufficient documentation

## 2017-06-24 DIAGNOSIS — J45909 Unspecified asthma, uncomplicated: Secondary | ICD-10-CM | POA: Insufficient documentation

## 2017-06-24 DIAGNOSIS — M545 Low back pain, unspecified: Secondary | ICD-10-CM

## 2017-06-24 DIAGNOSIS — Z79899 Other long term (current) drug therapy: Secondary | ICD-10-CM | POA: Insufficient documentation

## 2017-06-24 DIAGNOSIS — I10 Essential (primary) hypertension: Secondary | ICD-10-CM | POA: Insufficient documentation

## 2017-06-24 DIAGNOSIS — Z87891 Personal history of nicotine dependence: Secondary | ICD-10-CM | POA: Insufficient documentation

## 2017-06-24 MED ORDER — HYDROCODONE-ACETAMINOPHEN 5-325 MG PO TABS
1.0000 | ORAL_TABLET | Freq: Once | ORAL | Status: AC
  Administered 2017-06-24: 1 via ORAL
  Filled 2017-06-24: qty 1

## 2017-06-24 MED ORDER — CYCLOBENZAPRINE HCL 10 MG PO TABS
10.0000 mg | ORAL_TABLET | Freq: Once | ORAL | Status: AC
  Administered 2017-06-24: 10 mg via ORAL
  Filled 2017-06-24: qty 1
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
## 2017-06-25 MED ORDER — HYDROCODONE-ACETAMINOPHEN 5-325 MG PO TABS: 1.0000 | ORAL_TABLET | ORAL | 0 refills | Status: DC | PRN

## 2017-06-25 MED ORDER — CYCLOBENZAPRINE HCL 10 MG PO TABS: 10.0000 mg | ORAL_TABLET | Freq: Two times a day (BID) | ORAL | 0 refills | Status: DC | PRN

## 2017-06-25 MED ORDER — HYDROCODONE-ACETAMINOPHEN 5-325 MG PO TABS
1.0000 | ORAL_TABLET | Freq: Once | ORAL | Status: AC
  Administered 2017-06-25: 1 via ORAL
  Filled 2017-06-25: qty 1

## 2017-07-07 ENCOUNTER — Encounter: Payer: Self-pay | Admitting: Internal Medicine

## 2017-07-14 ENCOUNTER — Encounter: Payer: Self-pay | Admitting: *Deleted

## 2017-07-20 ENCOUNTER — Encounter (HOSPITAL_COMMUNITY): Payer: Self-pay | Admitting: *Deleted

## 2017-07-20 ENCOUNTER — Inpatient Hospital Stay (HOSPITAL_COMMUNITY)
Admission: EM | Admit: 2017-07-20 | Discharge: 2017-07-22 | DRG: 202 | Disposition: A | Discharge status: Home / Self Care | Payer: Self-pay | Attending: Internal Medicine | Admitting: Internal Medicine

## 2017-07-20 ENCOUNTER — Emergency Department (HOSPITAL_COMMUNITY): Payer: Self-pay

## 2017-07-20 DIAGNOSIS — J4521 Mild intermittent asthma with (acute) exacerbation: Secondary | ICD-10-CM

## 2017-07-20 DIAGNOSIS — R000 Tachycardia, unspecified: Secondary | ICD-10-CM

## 2017-07-20 DIAGNOSIS — J302 Other seasonal allergic rhinitis: Secondary | ICD-10-CM

## 2017-07-20 DIAGNOSIS — Z7984 Long term (current) use of oral hypoglycemic drugs: Secondary | ICD-10-CM

## 2017-07-20 DIAGNOSIS — F1290 Cannabis use, unspecified, uncomplicated: Secondary | ICD-10-CM | POA: Diagnosis present

## 2017-07-20 DIAGNOSIS — J45901 Unspecified asthma with (acute) exacerbation: Principal | ICD-10-CM | POA: Diagnosis present

## 2017-07-20 DIAGNOSIS — E1165 Type 2 diabetes mellitus with hyperglycemia: Secondary | ICD-10-CM

## 2017-07-20 DIAGNOSIS — I10 Essential (primary) hypertension: Secondary | ICD-10-CM | POA: Diagnosis present

## 2017-07-20 DIAGNOSIS — Z79899 Other long term (current) drug therapy: Secondary | ICD-10-CM

## 2017-07-20 DIAGNOSIS — E1149 Type 2 diabetes mellitus with other diabetic neurological complication: Secondary | ICD-10-CM

## 2017-07-20 DIAGNOSIS — T486X5A Adverse effect of antiasthmatics, initial encounter: Secondary | ICD-10-CM | POA: Diagnosis not present

## 2017-07-20 DIAGNOSIS — Z87891 Personal history of nicotine dependence: Secondary | ICD-10-CM

## 2017-07-20 DIAGNOSIS — J9601 Acute respiratory failure with hypoxia: Secondary | ICD-10-CM | POA: Diagnosis present

## 2017-07-20 DIAGNOSIS — R0902 Hypoxemia: Secondary | ICD-10-CM | POA: Diagnosis present

## 2017-07-20 DIAGNOSIS — Y92230 Patient room in hospital as the place of occurrence of the external cause: Secondary | ICD-10-CM | POA: Diagnosis not present

## 2017-07-20 DIAGNOSIS — E119 Type 2 diabetes mellitus without complications: Secondary | ICD-10-CM | POA: Diagnosis present

## 2017-07-20 DIAGNOSIS — I471 Supraventricular tachycardia: Secondary | ICD-10-CM | POA: Diagnosis not present

## 2017-07-20 DIAGNOSIS — Z7951 Long term (current) use of inhaled steroids: Secondary | ICD-10-CM

## 2017-07-20 LAB — CBC WITH DIFFERENTIAL/PLATELET
BASOS ABS: 0 10*3/uL (ref 0.0–0.1)
Basophils Relative: 0 %
EOS PCT: 1 %
Eosinophils Absolute: 0.1 10*3/uL (ref 0.0–0.7)
HEMATOCRIT: 47.9 % — AB (ref 36.0–46.0)
Hemoglobin: 15 g/dL (ref 12.0–15.0)
LYMPHS ABS: 4.3 10*3/uL — AB (ref 0.7–4.0)
LYMPHS PCT: 50 %
MCH: 28.6 pg (ref 26.0–34.0)
MCHC: 31.3 g/dL (ref 30.0–36.0)
MCV: 91.2 fL (ref 78.0–100.0)
MONO ABS: 0.7 10*3/uL (ref 0.1–1.0)
MONOS PCT: 8 %
NEUTROS ABS: 3.5 10*3/uL (ref 1.7–7.7)
Neutrophils Relative %: 41 %
PLATELETS: 271 10*3/uL (ref 150–400)
RBC: 5.25 MIL/uL — ABNORMAL HIGH (ref 3.87–5.11)
RDW: 14.9 % (ref 11.5–15.5)
WBC: 8.5 10*3/uL (ref 4.0–10.5)

## 2017-07-20 LAB — COMPREHENSIVE METABOLIC PANEL
ALT: 22 U/L (ref 14–54)
AST: 26 U/L (ref 15–41)
Albumin: 4.1 g/dL (ref 3.5–5.0)
Alkaline Phosphatase: 137 U/L — ABNORMAL HIGH (ref 38–126)
Anion gap: 9 (ref 5–15)
BILIRUBIN TOTAL: 1 mg/dL (ref 0.3–1.2)
BUN: 13 mg/dL (ref 6–20)
CO2: 24 mmol/L (ref 22–32)
CREATININE: 0.99 mg/dL (ref 0.44–1.00)
Calcium: 10 mg/dL (ref 8.9–10.3)
Chloride: 108 mmol/L (ref 101–111)
Glucose, Bld: 220 mg/dL — ABNORMAL HIGH (ref 65–99)
POTASSIUM: 3.8 mmol/L (ref 3.5–5.1)
Sodium: 141 mmol/L (ref 135–145)
TOTAL PROTEIN: 8 g/dL (ref 6.5–8.1)

## 2017-07-20 LAB — I-STAT ARTERIAL BLOOD GAS, ED
ACID-BASE EXCESS: 1 mmol/L (ref 0.0–2.0)
BICARBONATE: 26 mmol/L (ref 20.0–28.0)
O2 Saturation: 100 %
TCO2: 27 mmol/L (ref 0–100)
pCO2 arterial: 41.1 mmHg (ref 32.0–48.0)
pH, Arterial: 7.406 (ref 7.350–7.450)
pO2, Arterial: 420 mmHg — ABNORMAL HIGH (ref 83.0–108.0)

## 2017-07-20 LAB — RAPID URINE DRUG SCREEN, HOSP PERFORMED
Amphetamines: NOT DETECTED
BARBITURATES: NOT DETECTED
Benzodiazepines: NOT DETECTED
Cocaine: NOT DETECTED
Opiates: NOT DETECTED
Tetrahydrocannabinol: POSITIVE — AB

## 2017-07-20 LAB — CBG MONITORING, ED: GLUCOSE-CAPILLARY: 336 mg/dL — AB (ref 65–99)

## 2017-07-20 LAB — I-STAT TROPONIN, ED: TROPONIN I, POC: 0 ng/mL (ref 0.00–0.08)

## 2017-07-20 LAB — GLUCOSE, CAPILLARY: Glucose-Capillary: 314 mg/dL — ABNORMAL HIGH (ref 65–99)

## 2017-07-20 MED ORDER — ALBUTEROL (5 MG/ML) CONTINUOUS INHALATION SOLN
15.0000 mg/h | INHALATION_SOLUTION | Freq: Once | RESPIRATORY_TRACT | Status: AC
  Administered 2017-07-20: 15 mg/h via RESPIRATORY_TRACT
  Filled 2017-07-20: qty 20

## 2017-07-20 MED ORDER — IPRATROPIUM BROMIDE 0.02 % IN SOLN
0.5000 mg | Freq: Once | RESPIRATORY_TRACT | Status: AC
  Administered 2017-07-20: 0.5 mg via RESPIRATORY_TRACT
  Filled 2017-07-20: qty 2.5

## 2017-07-20 MED ORDER — IPRATROPIUM BROMIDE 0.02 % IN SOLN
0.5000 mg | Freq: Four times a day (QID) | RESPIRATORY_TRACT | Status: DC
  Administered 2017-07-20 – 2017-07-21 (×5): 0.5 mg via RESPIRATORY_TRACT
  Filled 2017-07-20 (×5): qty 2.5

## 2017-07-20 MED ORDER — DOXYCYCLINE HYCLATE 100 MG IV SOLR
100.0000 mg | Freq: Two times a day (BID) | INTRAVENOUS | Status: DC
  Administered 2017-07-20 – 2017-07-21 (×2): 100 mg via INTRAVENOUS
  Filled 2017-07-20 (×4): qty 100

## 2017-07-20 MED ORDER — PREDNISONE 20 MG PO TABS: 40.0000 mg | ORAL_TABLET | Freq: Every day | ORAL | Status: DC

## 2017-07-20 MED ORDER — LISINOPRIL 5 MG PO TABS
5.0000 mg | ORAL_TABLET | Freq: Every day | ORAL | Status: DC
  Administered 2017-07-20 – 2017-07-22 (×3): 5 mg via ORAL
  Filled 2017-07-20 (×3): qty 1

## 2017-07-20 MED ORDER — ADENOSINE 6 MG/2ML IV SOLN
6.0000 mg | Freq: Once | INTRAVENOUS | Status: AC
  Administered 2017-07-20: 6 mg via INTRAVENOUS

## 2017-07-20 MED ORDER — ACETAMINOPHEN 650 MG RE SUPP: 650.0000 mg | Freq: Four times a day (QID) | RECTAL | Status: DC | PRN

## 2017-07-20 MED ORDER — ADENOSINE 6 MG/2ML IV SOLN
INTRAVENOUS | Status: AC
  Filled 2017-07-20: qty 4

## 2017-07-20 MED ORDER — LEVALBUTEROL HCL 0.63 MG/3ML IN NEBU
0.6300 mg | INHALATION_SOLUTION | Freq: Four times a day (QID) | RESPIRATORY_TRACT | Status: DC
  Administered 2017-07-20 – 2017-07-21 (×5): 0.63 mg via RESPIRATORY_TRACT
  Filled 2017-07-20 (×5): qty 3

## 2017-07-20 MED ORDER — PANTOPRAZOLE SODIUM 40 MG PO TBEC
40.0000 mg | DELAYED_RELEASE_TABLET | Freq: Every day | ORAL | Status: DC
  Administered 2017-07-20 – 2017-07-22 (×3): 40 mg via ORAL
  Filled 2017-07-20 (×3): qty 1

## 2017-07-20 MED ORDER — ACETAMINOPHEN 325 MG PO TABS
650.0000 mg | ORAL_TABLET | Freq: Four times a day (QID) | ORAL | Status: DC | PRN
  Administered 2017-07-21 – 2017-07-22 (×2): 650 mg via ORAL
  Filled 2017-07-20 (×2): qty 2

## 2017-07-20 MED ORDER — LORAZEPAM 2 MG/ML IJ SOLN
INTRAMUSCULAR | Status: AC
  Filled 2017-07-20: qty 1

## 2017-07-20 MED ORDER — SODIUM CHLORIDE 0.9 % IV BOLUS (SEPSIS)
1000.0000 mL | Freq: Once | INTRAVENOUS | Status: AC
  Administered 2017-07-20: 1000 mL via INTRAVENOUS

## 2017-07-20 MED ORDER — ENOXAPARIN SODIUM 40 MG/0.4ML ~~LOC~~ SOLN
40.0000 mg | SUBCUTANEOUS | Status: DC
  Administered 2017-07-20 – 2017-07-21 (×2): 40 mg via SUBCUTANEOUS
  Filled 2017-07-20 (×2): qty 0.4

## 2017-07-20 MED ORDER — FLUTICASONE PROPIONATE 50 MCG/ACT NA SUSP
2.0000 | Freq: Every day | NASAL | Status: DC
  Administered 2017-07-22: 2 via NASAL
  Filled 2017-07-20 (×2): qty 16

## 2017-07-20 MED ORDER — POLYETHYLENE GLYCOL 3350 17 G PO PACK
17.0000 g | PACK | Freq: Every day | ORAL | Status: DC | PRN
  Administered 2017-07-21 – 2017-07-22 (×2): 17 g via ORAL
  Filled 2017-07-20 (×2): qty 1

## 2017-07-20 MED ORDER — METHYLPREDNISOLONE SODIUM SUCC 40 MG IJ SOLR
40.0000 mg | Freq: Four times a day (QID) | INTRAMUSCULAR | Status: DC
  Administered 2017-07-20 – 2017-07-21 (×3): 40 mg via INTRAVENOUS
  Filled 2017-07-20 (×3): qty 1

## 2017-07-20 MED ORDER — FAMOTIDINE 20 MG PO TABS
20.0000 mg | ORAL_TABLET | Freq: Two times a day (BID) | ORAL | Status: DC
  Administered 2017-07-20 – 2017-07-22 (×4): 20 mg via ORAL
  Filled 2017-07-20 (×4): qty 1

## 2017-07-20 MED ORDER — LORATADINE 10 MG PO TABS
10.0000 mg | ORAL_TABLET | Freq: Every day | ORAL | Status: DC
  Administered 2017-07-20 – 2017-07-22 (×3): 10 mg via ORAL
  Filled 2017-07-20 (×3): qty 1

## 2017-07-20 MED ORDER — SERTRALINE HCL 50 MG PO TABS
75.0000 mg | ORAL_TABLET | ORAL | Status: DC
  Administered 2017-07-21 – 2017-07-22 (×2): 75 mg via ORAL
  Filled 2017-07-20 (×2): qty 1

## 2017-07-20 MED ORDER — IPRATROPIUM-ALBUTEROL 0.5-2.5 (3) MG/3ML IN SOLN: 3.0000 mL | RESPIRATORY_TRACT | Status: DC

## 2017-07-20 MED ORDER — LORAZEPAM 2 MG/ML IJ SOLN
1.0000 mg | Freq: Once | INTRAMUSCULAR | Status: AC
  Administered 2017-07-20: 1 mg via INTRAVENOUS

## 2017-07-20 MED ORDER — INSULIN ASPART 100 UNIT/ML ~~LOC~~ SOLN
0.0000 [IU] | Freq: Three times a day (TID) | SUBCUTANEOUS | Status: DC
  Administered 2017-07-20 – 2017-07-21 (×2): 15 [IU] via SUBCUTANEOUS
  Filled 2017-07-20: qty 1

## 2017-07-20 MED ORDER — MAGNESIUM SULFATE 2 GM/50ML IV SOLN
2.0000 g | Freq: Once | INTRAVENOUS | Status: AC
  Administered 2017-07-20: 2 g via INTRAVENOUS
  Filled 2017-07-20: qty 50

## 2017-07-20 MED ORDER — GABAPENTIN 300 MG PO CAPS
300.0000 mg | ORAL_CAPSULE | Freq: Two times a day (BID) | ORAL | Status: DC
  Administered 2017-07-20 – 2017-07-22 (×4): 300 mg via ORAL
  Filled 2017-07-20 (×4): qty 1

## 2017-07-20 NOTE — Progress Notes (Signed)
Patient received from ED to 4E27. Pt placed on monitor, vital signs stable. Patient is alert and respiratory status is stable. Droplet precautions initiated per MD order. Pt and family educated on reason for contact precautions.

## 2017-07-20 NOTE — ED Notes (Signed)
Meal tray delivered.

## 2017-07-20 NOTE — ED Notes (Signed)
Family at bedside. 

## 2017-07-20 NOTE — Consult Note (Signed)
Name: [name] MRN: [id number] DOB: 1966/08/14    ADMISSION DATE:  07/20/2017 CONSULTATION DATE:  07/20/2017  REFERRING MD : Dr. Fredric Dine  CHIEF COMPLAINT:  SOB  HISTORY OF PRESENT ILLNESS:  51 year old female with PMH as below, which is significant for [place] (with recent admits for this, never intubated), HTN, and depression. She was in her usual state of health until 7/29 when she developed a minimally productive cough. Productive for green/black sputum. Denies associated fevers/chills. No known sick contacts. When she awoke from sleep 7/30 she was markedly dyspneic. She took her Qvar inhaler without relief. Unclear if she tried albuterol. She called EMS when flare would not break. EMS noted her to be hypoxemic with O2 sats in 70s on room air. She was given 10 mg albuterol, 1 mg Atrovent, EpiPen, 125 mg solumedrol by EMS.  Upon arrival to ED she was placed on BiPAP and was given additional albuterol.. She did become markedly tachycardic to 160s and was given 6mg  of adenosine, which was not effective, but her HR did eventually recover into the 120s.   SIGNIFICANT EVENTS:    STUDIES:    PAST MEDICAL HISTORY :   has a past medical history of Asthma; Depression; and Hypertension.  has a past surgical history that includes Abdominal hysterectomy and Tubal ligation. Prior to Admission medications   Medication Sig Start Date End Date Taking? Authorizing Provider  albuterol (PROVENTIL HFA;VENTOLIN HFA) 108 (90 Base) MCG/ACT inhaler Inhale 1-2 puffs into the lungs every 6 (six) hours as needed for wheezing or shortness of breath. 01/07/16   Arnoldo Morale, MD  albuterol (PROVENTIL) (2.5 MG/3ML) 0.083% nebulizer solution Take 3 mLs (2.5 mg total) by nebulization every 2 (two) hours as needed for wheezing or shortness of breath. 05/24/17   Zada Finders, MD  beclomethasone (QVAR) 80 MCG/ACT inhaler Inhale 1 puff into the lungs 2 (two) times daily. 01/07/16   Arnoldo Morale, MD    chlorpheniramine-HYDROcodone (TUSSIONEX) 10-8 MG/5ML SUER Take 5 mLs by mouth every 12 (twelve) hours. Patient taking differently: Take 5 mLs by mouth every 12 (twelve) hours as needed for cough.  05/23/17   Ophelia Shoulder, MD  cyclobenzaprine (FLEXERIL) 10 MG tablet Take 1 tablet (10 mg total) by mouth 2 (two) times daily as needed for muscle spasms. 06/25/17   Charlann Lange, PA-C  diclofenac sodium (VOLTAREN) 1 % GEL Apply 2 g topically 4 (four) times daily. Patient taking differently: Apply 2 g topically 4 (four) times daily as needed (pain).  06/14/17   Alphonzo Grieve, MD  fluticasone (FLONASE) 50 MCG/ACT nasal spray Place 2 sprays into both nostrils daily. Patient taking differently: Place 2 sprays into both nostrils daily as needed for allergies.  06/15/17   Alphonzo Grieve, MD  gabapentin (NEURONTIN) 300 MG capsule Take 1 capsule (300 mg total) by mouth 2 (two) times daily. 01/07/16   Arnoldo Morale, MD  glipiZIDE (GLUCOTROL) 5 MG tablet Take 0.5 tablets (2.5 mg total) by mouth daily before breakfast. Patient taking differently: Take 5 mg by mouth every morning.  01/07/16   Arnoldo Morale, MD  guaiFENesin (MUCINEX) 600 MG 12 hr tablet Take 600 mg by mouth 2 (two) times daily as needed for cough.     [provider]  HYDROcodone-acetaminophen (NORCO/VICODIN) 5-325 MG tablet Take 1-2 tablets by mouth every 4 (four) hours as needed. 06/25/17   Charlann Lange, PA-C  lisinopril (PRINIVIL,ZESTRIL) 5 MG tablet Take 1 tablet (5 mg total) by mouth daily. 06/05/17  Shela Leff, MD  loratadine (CLARITIN) 10 MG tablet Take 1 tablet (10 mg total) by mouth daily. 05/24/17   Ophelia Shoulder, MD  omeprazole (PRILOSEC) 20 MG capsule Take 2 capsules (40 mg total) by mouth daily. 06/14/17   Alphonzo Grieve, MD  polyethylene glycol (MIRALAX / GLYCOLAX) packet Take 17 g by mouth daily as needed for mild constipation.     [provider]  sertraline (ZOLOFT) 50 MG tablet Take 75 mg by mouth daily.     [provider]   No Known Allergies  FAMILY HISTORY:  family history is not on file. SOCIAL HISTORY:  reports that she quit smoking about 12 years ago. Her smoking use included Cigarettes. She has never used smokeless tobacco. She reports that she does not drink alcohol or use drugs.  REVIEW OF SYSTEMS:   Bolds are positive  Constitutional: weight loss, gain, night sweats, Fevers, chills, fatigue .  HEENT: headaches, Sore throat, sneezing, nasal congestion, post nasal drip, Difficulty swallowing, Tooth/dental problems, visual complaints visual changes, ear ache CV:  chest pain, radiates:,Orthopnea, PND, swelling in lower extremities knees only after walking, dizziness, palpitations, syncope.  GI  heartburn, indigestion, abdominal pain, nausea, vomiting, diarrhea, change in bowel habits, loss of appetite, bloody stools.  Resp: cough, productive: , hemoptysis, dyspnea, chest pain, pleuritic much worse with deep inspiration.  Skin: rash or itching or icterus GU: dysuria, change in color of urine, urgency or frequency. flank pain, hematuria  MS: joint pain or swelling. decreased range of motion  Psych: change in mood or affect. depression or anxiety.  Neuro: difficulty with speech, weakness, numbness, ataxia    SUBJECTIVE:   VITAL SIGNS: Temp:  [97.6 F (36.4 C)] 97.6 F (36.4 C) (07/30 1053) Pulse Rate:  [100-167] 138 (07/30 1400) Resp:  [17-23] 21 (07/30 1400) BP: (137-199)/(88-119) 160/106 (07/30 1400) SpO2:  [96 %-100 %] 98 % (07/30 1400) FiO2 (%):  [70 %] 70 % (07/30 1100) Weight:  [84.8 kg (187 lb)] 84.8 kg (187 lb) (07/30 1053)  PHYSICAL EXAMINATION: General:  Middle aged female in mild resp distress Neuro:  Alert, oriented, non-focal HEENT:  [place]/AT, PERRL, JVD Cardiovascular:  Tachy, regular Lungs:  Clear, bilateral breath sounds. No wheeze. Abdomen:  Soft, non-tender, non-distended Musculoskeletal:  No acute deformity or ROM limitation Skin:  Grossly  intact   Recent Labs Lab 07/20/17 1052  NA 141  K 3.8  CL 108  CO2 24  BUN 13  CREATININE 0.99  GLUCOSE 220*    Recent Labs Lab 07/20/17 1052  HGB 15.0  HCT 47.9*  WBC 8.5  PLT 271   Dg Chest Port 1 View  Result Date: 07/20/2017 CLINICAL DATA:  Short of breath EXAM: PORTABLE CHEST 1 VIEW COMPARISON:  06/12/2017 FINDINGS: The heart size and mediastinal contours are within normal limits. Both lungs are clear. The visualized skeletal structures are unremarkable. IMPRESSION: No active disease. Electronically Signed   By: Franchot Gallo M.D.   On: 07/20/2017 11:19    ASSESSMENT / PLAN:  51 year old female with asthma and recent admissions for this. Presented with dyspnea 7/30 requiring BiPAP. Improved with steroids, albuterol, epi-pen, and BiPAP in ED. Being admitted to IMTS for asthma exacerbation.   Plan: Admit to SDU under IMTS service Supplemental O2 as needed to keep SpO2 90-95%.  Scheduled ipratropium, levalbuterol Holding home Qvar. Will need extensive education on rescue vs maintenance medications at discharge PRN levalbuetrol Scheduled solumedrol 40mg  q6 hours PO doxycycline for a 5 day course.  Pepcid 20mg  BID UDS Respiratory viral panel  Georgann Housekeeper, AGACNP-BC [facility] Pulmonology/Critical Care Pager [phone] or [phone]  07/20/2017 3:06 PM

## 2017-07-20 NOTE — ED Triage Notes (Signed)
Patient states she was fine last pm woke this am with severe resp. Distress was only able to speak in 1 word phrase. sats 70 %RA, ems gave Albuterol 10 mg , Atrovent 1 mg, Epip.  30 Day Unplanned Readmission Risk Score     ED to Hosp-Admission (Discharged) from 06/12/2017 in [facility]  30 Day Unplanned Readmission Risk Score (%)  19 Filed at 06/14/2017 1600     This score is the patient's risk of an unplanned readmission within 30 days of being discharged (0 -100%). The score is based on dignosis, age, lab data, medications, orders, and past utilization.   Low:  0-14.9   Medium: 15-49.9   High: 50- 69.9   Extreme: 70 and above       mg and solu-medrol 125 mg iv  Placed patient on cpap.

## 2017-07-20 NOTE — ED Notes (Signed)
Pt changed over to [place] tolerating well at this time.

## 2017-07-20 NOTE — H&P (Signed)
Date: 07/20/2017               Patient Name:  [name] MRN: [id number]  DOB: 07/14/1966 Age / Sex: 51 y.o., female   PCP: Crystal Innocent Audrea Muscat, NP         Medical Service: Internal Medicine Teaching Service         Attending Physician: Dr. Aldine Contes, MD    First Contact: Dr. Tarri Abernethy Pager: [phone]  Second Contact: Dr. Marlowe Sax Pager: [phone]       After Hours (After 5p/  First Contact Pager: [phone]  weekends / holidays): Second Contact Pager: [phone]   Chief Complaint: Shortness of Breath   History of Present Illness: [name] is a 51 y.o. Female with a PMHx significant for Asthma treated with Albuterol prn and Beclomethasone 1 puff BID who is presenting with SOA of 1 day(s) duration. She woke up with difficulty breathing, daughter at bedside states that she took her QVAR with no relief. She continued to decline so daughter called EMS. She has had multiple asthma exacerbations in the past 3 months, stating that she uses her albuterol inhaler multiple times per day and is woken up nightly by coughing spells. She does have seasonal allergies but denies any recent changes in her home or new pets. She endorses some chest discomfort that is worse with cough and deep inspiration. She denies recent fever, chills, palpitations, diaphoresis, medication noncompliance, smoking. She was schedule to see an allergist as an outpatient but failed to see them.   EMS found her to be hypoxic with a O2 sat of 70% on RA. She was given Albuterol 10 mg, Atrovent 1 mg, EpiPen, and 125 mg Solu-medrol by EMS. She was given a total of 15 mg Albuterol in the ED subsequently entering SVT (HR 160), she was given adenosine and placed on BiPAP. Chest X-ray was unremarkable. ABG significant for pH 7.39, pCO2 40, HCO3 26.   Meds:  Current Meds  Medication Sig  . albuterol (PROVENTIL HFA;VENTOLIN HFA) 108 (90 Base) MCG/ACT inhaler Inhale 1-2 puffs into the lungs every 6 (six) hours as needed for  wheezing or shortness of breath.  Marland Kitchen albuterol (PROVENTIL) (2.5 MG/3ML) 0.083% nebulizer solution Take 3 mLs (2.5 mg total) by nebulization every 2 (two) hours as needed for wheezing or shortness of breath.  . beclomethasone (QVAR) 80 MCG/ACT inhaler Inhale 1 puff into the lungs 2 (two) times daily.  . cyclobenzaprine (FLEXERIL) 10 MG tablet Take 1 tablet (10 mg total) by mouth 2 (two) times daily as needed for muscle spasms.  . diclofenac sodium (VOLTAREN) 1 % GEL Apply 2 g topically 4 (four) times daily. (Patient taking differently: Apply 2 g topically 4 (four) times daily as needed (pain). )  . fluticasone (FLONASE) 50 MCG/ACT nasal spray Place 2 sprays into both nostrils daily.  Marland Kitchen gabapentin (NEURONTIN) 300 MG capsule Take 1 capsule (300 mg total) by mouth 2 (two) times daily.  Marland Kitchen glipiZIDE (GLUCOTROL) 5 MG tablet Take 0.5 tablets (2.5 mg total) by mouth daily before breakfast. (Patient taking differently: Take 5 mg by mouth every morning. )  . guaiFENesin (MUCINEX) 600 MG 12 hr tablet Take 600 mg by mouth 2 (two) times daily as needed for cough.   Marland Kitchen lisinopril (PRINIVIL,ZESTRIL) 5 MG tablet Take 1 tablet (5 mg total) by mouth daily.  Marland Kitchen loratadine (CLARITIN) 10 MG tablet Take 1 tablet (10 mg total) by mouth daily.  Marland Kitchen omeprazole (PRILOSEC) 20 MG capsule Take  2 capsules (40 mg total) by mouth daily.  . sertraline (ZOLOFT) 50 MG tablet Take 75 mg by mouth every morning.    Allergies: Allergies as of 07/20/2017  . (No Known Allergies)   Past Medical History:  Diagnosis Date  . Asthma   . Depression   . Hypertension    Family History:  Daughter alive and healthy   Social History:  Denies tobacco use  Denies alcohol use  Denies illicit drug use  Review of Systems: A complete ROS was negative except as per HPI.   Physical Exam: Blood pressure (!) 154/99, pulse (!) 136, temperature 97.6 F (36.4 C), temperature source Axillary, resp. rate (!) 25, height 5\' 1"  (1.549 m), weight 187 lb  (84.8 kg), SpO2 97 %.  Physical Exam  Constitutional: She is oriented to person, place, and time. She appears distressed.  HENT:  Head: Normocephalic and atraumatic.  Eyes: Pupils are equal, round, and reactive to light. Conjunctivae are normal.  Cardiovascular: Regular rhythm, normal heart sounds and intact distal pulses.   Tachycardia  Pulmonary/Chest: She is in respiratory distress. She has wheezes.  Diminished breath sounds in all lung fields  Abdominal: Soft. Bowel sounds are normal. She exhibits no distension. There is no tenderness. There is no rebound.  Musculoskeletal: She exhibits no edema.  Neurological: She is alert and oriented to person, place, and time.  Skin: Skin is warm and dry. She is not diaphoretic.   EKG: personally reviewed; my interpretation is tachycardia, hard to assess due to background   CXR: personally reviewed; my interpretation is no opacities, no vascular congestion, no cardiomegaly  Assessment & Plan by Problem: Active Problems:   Asthma exacerbation  1. Asthma Exacerbation  - No recent illness, several exacerbations in the past 3 months  - On Albuterol and QVAR at home  - CXR unremarkable  - ABGs 7.39/40/420 - PCCM made aware of the patient; appreciate the recs  - DC BiPAP, supplemental oxygen  - Scheduled ipratropium/levalbuterol q4hrs - Schedule methylprednisolone 40mg  q6hrs  - Viral respiratory panel  - PO Doxycycline   2. Hypertension  - Continue Lisinopril 5 mg PO  Dispo: Admit patient to Observation with expected length of stay less than 2 midnights.  SignedIna Homes, MD 07/20/2017, 3:02 PM  My Pager: [phone]

## 2017-07-20 NOTE — ED Notes (Signed)
Pt suddenly became sob and heart rate in 160's pt extremely aniexty. MD Darl Householder at bedside.

## 2017-07-20 NOTE — ED Notes (Signed)
ED Provider at bedside. 

## 2017-07-20 NOTE — ED Notes (Signed)
Meal tray ordered 

## 2017-07-20 NOTE — ED Notes (Signed)
X-ray at bedside

## 2017-07-20 NOTE — ED Notes (Signed)
Attempted report 

## 2017-07-20 NOTE — ED Provider Notes (Signed)
[facility] DEPT Provider Note   CSN: [id number] Arrival date & time: 07/20/17  1047     History   Chief Complaint Chief Complaint  Patient presents with  . Shortness of Breath  . Respiratory Distress    HPI [name] is a 51 y.o. female hx of asthma, HTN, Here presenting with shortness of breath. Patient woke up this morning and had severe shortness of breath. She was noted to be hypoxic with oxygen 70% on room air and was given 10 mg albuterol, 1 mg Atrovent, EpiPen, 125 mg solumedrol by EMS. Patient states that she has some chest pressure. She was admitted multiple times for asthma exacerbation, most recently about a month ago.   The history is provided by the patient and the EMS personnel.   Level V caveat- condition of patient   Past Medical History:  Diagnosis Date  . Asthma   . Depression   . Hypertension     Patient Active Problem List   Diagnosis Date Noted  . Hypokalemia 06/12/2017  . Marijuana use 06/12/2017  . Acute pain of right knee 10/14/2016  . Diabetic neuropathy ([place]) 01/07/2016  . Plantar fasciitis of left foot 11/26/2015  . Back pain 10/10/2015  . Asthma 09/29/2015  . Essential hypertension 09/29/2015  . Diabetes mellitus ([place]) 09/29/2015  . Chest pain 09/28/2015    Past Surgical History:  Procedure Laterality Date  . ABDOMINAL HYSTERECTOMY    . TUBAL LIGATION      OB History    No data available       Home Medications    Prior to Admission medications   Medication Sig Start Date End Date Taking? Authorizing Provider  albuterol (PROVENTIL HFA;VENTOLIN HFA) 108 (90 Base) MCG/ACT inhaler Inhale 1-2 puffs into the lungs every 6 (six) hours as needed for wheezing or shortness of breath. 01/07/16   Arnoldo Morale, MD  albuterol (PROVENTIL) (2.5 MG/3ML) 0.083% nebulizer solution Take 3 mLs (2.5 mg total) by nebulization every 2 (two) hours as needed for wheezing or shortness of breath. 05/24/17   Zada Finders, MD  beclomethasone (QVAR)  80 MCG/ACT inhaler Inhale 1 puff into the lungs 2 (two) times daily. 01/07/16   Arnoldo Morale, MD  chlorpheniramine-HYDROcodone (TUSSIONEX) 10-8 MG/5ML SUER Take 5 mLs by mouth every 12 (twelve) hours. Patient taking differently: Take 5 mLs by mouth every 12 (twelve) hours as needed for cough.  05/23/17   Ophelia Shoulder, MD  cyclobenzaprine (FLEXERIL) 10 MG tablet Take 1 tablet (10 mg total) by mouth 2 (two) times daily as needed for muscle spasms. 06/25/17   Charlann Lange, PA-C  diclofenac sodium (VOLTAREN) 1 % GEL Apply 2 g topically 4 (four) times daily. Patient taking differently: Apply 2 g topically 4 (four) times daily as needed (pain).  06/14/17   Alphonzo Grieve, MD  fluticasone (FLONASE) 50 MCG/ACT nasal spray Place 2 sprays into both nostrils daily. Patient taking differently: Place 2 sprays into both nostrils daily as needed for allergies.  06/15/17   Alphonzo Grieve, MD  gabapentin (NEURONTIN) 300 MG capsule Take 1 capsule (300 mg total) by mouth 2 (two) times daily. 01/07/16   Arnoldo Morale, MD  glipiZIDE (GLUCOTROL) 5 MG tablet Take 0.5 tablets (2.5 mg total) by mouth daily before breakfast. Patient taking differently: Take 5 mg by mouth every morning.  01/07/16   Arnoldo Morale, MD  guaiFENesin (MUCINEX) 600 MG 12 hr tablet Take 600 mg by mouth 2 (two) times daily as needed for cough.  [provider]  HYDROcodone-acetaminophen (NORCO/VICODIN) 5-325 MG tablet Take 1-2 tablets by mouth every 4 (four) hours as needed. 06/25/17   Charlann Lange, PA-C  lisinopril (PRINIVIL,ZESTRIL) 5 MG tablet Take 1 tablet (5 mg total) by mouth daily. 06/05/17   Shela Leff, MD  loratadine (CLARITIN) 10 MG tablet Take 1 tablet (10 mg total) by mouth daily. 05/24/17   Ophelia Shoulder, MD  omeprazole (PRILOSEC) 20 MG capsule Take 2 capsules (40 mg total) by mouth daily. 06/14/17   Alphonzo Grieve, MD  polyethylene glycol (MIRALAX / GLYCOLAX) packet Take 17 g by mouth daily as needed for mild constipation.      [provider]  sertraline (ZOLOFT) 50 MG tablet Take 75 mg by mouth daily.    [provider]    Family History No family history on file.  Social History Social History  Substance Use Topics  . Smoking status: Former Smoker    Types: Cigarettes    Quit date: 12/22/2004  . Smokeless tobacco: Never Used  . Alcohol use No     Allergies   Patient has no known allergies.   Review of Systems Review of Systems  Respiratory: Positive for cough and shortness of breath.   All other systems reviewed and are negative.    Physical Exam Updated Vital Signs BP (!) 160/108   Pulse (!) 126   Temp 97.6 F (36.4 C) (Axillary)   Resp 17   Ht 5\' 1"  (1.549 m)   Wt 84.8 kg (187 lb)   SpO2 99%   BMI 35.33 kg/m   Physical Exam  Constitutional:  Tachypneic, tachycardic   HENT:  Head: Normocephalic.  Eyes: Pupils are equal, round, and reactive to light. Conjunctivae and EOM are normal.  Neck: Normal range of motion. Neck supple.  Cardiovascular:  Tachycardic   Pulmonary/Chest:  + tachypneic, + retractions, poor air movement, minimal wheezing   Abdominal: Soft. Bowel sounds are normal. She exhibits no distension. There is no tenderness.  Musculoskeletal: Normal range of motion.  Neurological: She is alert.  Skin: Skin is warm.  Psychiatric: She has a normal mood and affect.  Nursing note and vitals reviewed.    ED Treatments / Results  Labs (all labs ordered are listed, but only abnormal results are displayed) Labs Reviewed  CBC WITH DIFFERENTIAL/PLATELET - Abnormal; Notable for the following:       Result Value   RBC 5.25 (*)    HCT 47.9 (*)    Lymphs Abs 4.3 (*)    All other components within normal limits  COMPREHENSIVE METABOLIC PANEL - Abnormal; Notable for the following:    Glucose, Bld 220 (*)    Alkaline Phosphatase 137 (*)    All other components within normal limits  BLOOD GAS, ARTERIAL  I-STAT TROPONIN, ED    EKG  EKG  Interpretation  Date/Time:  Monday July 20 2017 10:49:17 EDT Ventricular Rate:  105 PR Interval:    QRS Duration: 124 QT Interval:  352 QTC Calculation: 466 R Axis:   2 Text Interpretation:  Sinus tachycardia  Probable left ventricular hypertrophy Artifact in lead(s) I II III aVR aVL aVF V1 V2 V3 V4 V5 V6 poor baseline  Confirmed by Wandra Arthurs ([id number]) on 07/20/2017 11:49:58 AM       Radiology Dg Chest Port 1 View  Result Date: 07/20/2017 CLINICAL DATA:  Short of breath EXAM: PORTABLE CHEST 1 VIEW COMPARISON:  06/12/2017 FINDINGS: The heart size and mediastinal contours are within normal limits. Both  lungs are clear. The visualized skeletal structures are unremarkable. IMPRESSION: No active disease. Electronically Signed   By: Franchot Gallo M.D.   On: 07/20/2017 11:19    Procedures Procedures (including critical care time)  CRITICAL CARE Performed by: Wandra Arthurs   Total critical care time: 30 minutes  Critical care time was exclusive of separately billable procedures and treating other patients.  Critical care was necessary to treat or prevent imminent or life-threatening deterioration.  Critical care was time spent personally by me on the following activities: development of treatment plan with patient and/or surrogate as well as nursing, discussions with consultants, evaluation of patient's response to treatment, examination of patient, obtaining history from patient or surrogate, ordering and performing treatments and interventions, ordering and review of laboratory studies, ordering and review of radiographic studies, pulse oximetry and re-evaluation of patient's condition.   Medications Ordered in ED Medications  magnesium sulfate IVPB 2 g 50 mL (2 g Intravenous New Bag/Given 07/20/17 1110)  albuterol (PROVENTIL,VENTOLIN) solution continuous neb (15 mg/hr Nebulization Given 07/20/17 1057)  ipratropium (ATROVENT) nebulizer solution 0.5 mg (0.5 mg Nebulization Given 07/20/17  1057)  sodium chloride 0.9 % bolus 1,000 mL (1,000 mLs Intravenous New Bag/Given 07/20/17 1110)     Initial Impression / Assessment and Plan / ED Course  I have reviewed the triage vital signs and the nursing notes.  Pertinent labs & imaging results that were available during my care of the patient were reviewed by me and considered in my medical decision making (see chart for details).     [name] is a 51 y.o. female hx of asthma here with hypoxia, shortness of breath. Given solumedrol, epi, duoneb by EMS. Patient on bipap and still tachypneic. Will give magnesium, continuous neb. Will get ABG.   12:46 PM ABG reassuring. She took herself off bipap and now on nonrebreather. Briefly became tachy to 160s. Tried adenosine with no relief but she appears anxious so I ordered ativan and HR down to 120s and she felt better. Had CT angio done and showed no PE a month ago. Internal medicine teaching service to admit to stepdown for respiratory distress with hypoxia from asthma.   Final Clinical Impressions(s) / ED Diagnoses   Final diagnoses:  None    New Prescriptions New Prescriptions   No medications on file     Drenda Freeze, MD 07/20/17 1247

## 2017-07-21 DIAGNOSIS — E119 Type 2 diabetes mellitus without complications: Secondary | ICD-10-CM

## 2017-07-21 DIAGNOSIS — J9601 Acute respiratory failure with hypoxia: Secondary | ICD-10-CM

## 2017-07-21 LAB — GLUCOSE, CAPILLARY
GLUCOSE-CAPILLARY: 204 mg/dL — AB (ref 65–99)
Glucose-Capillary: 378 mg/dL — ABNORMAL HIGH (ref 65–99)
Glucose-Capillary: 254 mg/dL — ABNORMAL HIGH (ref 65–99)
Glucose-Capillary: 218 mg/dL — ABNORMAL HIGH (ref 65–99)

## 2017-07-21 LAB — RESPIRATORY PANEL BY PCR
ADENOVIRUS-RVPPCR: NOT DETECTED
Bordetella pertussis: NOT DETECTED
CHLAMYDOPHILA PNEUMONIAE-RVPPCR: NOT DETECTED
CORONAVIRUS 229E-RVPPCR: NOT DETECTED
Coronavirus HKU1: NOT DETECTED
Coronavirus NL63: NOT DETECTED
Coronavirus OC43: NOT DETECTED
INFLUENZA B-RVPPCR: NOT DETECTED
Influenza A: NOT DETECTED
METAPNEUMOVIRUS-RVPPCR: NOT DETECTED
Mycoplasma pneumoniae: NOT DETECTED
PARAINFLUENZA VIRUS 1-RVPPCR: NOT DETECTED
PARAINFLUENZA VIRUS 3-RVPPCR: NOT DETECTED
PARAINFLUENZA VIRUS 4-RVPPCR: NOT DETECTED
Parainfluenza Virus 2: NOT DETECTED
RHINOVIRUS / ENTEROVIRUS - RVPPCR: NOT DETECTED
Respiratory Syncytial Virus: NOT DETECTED

## 2017-07-21 LAB — BASIC METABOLIC PANEL
Anion gap: 10 (ref 5–15)
BUN: 11 mg/dL (ref 6–20)
CALCIUM: 10 mg/dL (ref 8.9–10.3)
CO2: 20 mmol/L — ABNORMAL LOW (ref 22–32)
CREATININE: 1.08 mg/dL — AB (ref 0.44–1.00)
Chloride: 107 mmol/L (ref 101–111)
GFR calc non Af Amer: 59 mL/min — ABNORMAL LOW (ref >60)
Glucose, Bld: 428 mg/dL — ABNORMAL HIGH (ref 65–99)
Potassium: 4.7 mmol/L (ref 3.5–5.1)
SODIUM: 137 mmol/L (ref 135–145)

## 2017-07-21 MED ORDER — INSULIN ASPART 100 UNIT/ML ~~LOC~~ SOLN
0.0000 [IU] | Freq: Three times a day (TID) | SUBCUTANEOUS | Status: DC
  Administered 2017-07-21: 7 [IU] via SUBCUTANEOUS
  Administered 2017-07-21 – 2017-07-22 (×2): 11 [IU] via SUBCUTANEOUS
  Administered 2017-07-22: 15 [IU] via SUBCUTANEOUS
  Administered 2017-07-22: 20 [IU] via SUBCUTANEOUS

## 2017-07-21 MED ORDER — LEVALBUTEROL HCL 0.63 MG/3ML IN NEBU
0.6300 mg | INHALATION_SOLUTION | Freq: Four times a day (QID) | RESPIRATORY_TRACT | Status: DC
  Administered 2017-07-22 (×3): 0.63 mg via RESPIRATORY_TRACT
  Filled 2017-07-21 (×3): qty 3

## 2017-07-21 MED ORDER — INSULIN ASPART 100 UNIT/ML ~~LOC~~ SOLN
0.0000 [IU] | Freq: Every day | SUBCUTANEOUS | Status: DC
  Administered 2017-07-21: 2 [IU] via SUBCUTANEOUS

## 2017-07-21 MED ORDER — IPRATROPIUM BROMIDE 0.02 % IN SOLN
0.5000 mg | Freq: Four times a day (QID) | RESPIRATORY_TRACT | Status: DC
  Administered 2017-07-22 (×3): 0.5 mg via RESPIRATORY_TRACT
  Filled 2017-07-21 (×3): qty 2.5

## 2017-07-21 MED ORDER — PREDNISONE 20 MG PO TABS
40.0000 mg | ORAL_TABLET | Freq: Every day | ORAL | Status: DC
  Administered 2017-07-22: 40 mg via ORAL
  Filled 2017-07-21: qty 2

## 2017-07-21 MED ORDER — METHYLPREDNISOLONE SODIUM SUCC 40 MG IJ SOLR
40.0000 mg | Freq: Four times a day (QID) | INTRAMUSCULAR | Status: AC
  Administered 2017-07-21 (×2): 40 mg via INTRAVENOUS
  Filled 2017-07-21 (×2): qty 1

## 2017-07-21 MED ORDER — GUAIFENESIN 100 MG/5ML PO SOLN
5.0000 mL | Freq: Once | ORAL | Status: AC
  Administered 2017-07-21: 100 mg via ORAL
  Filled 2017-07-21: qty 5

## 2017-07-21 MED ORDER — DOXYCYCLINE HYCLATE 100 MG PO TABS
100.0000 mg | ORAL_TABLET | Freq: Two times a day (BID) | ORAL | Status: DC
  Administered 2017-07-21 – 2017-07-22 (×3): 100 mg via ORAL
  Filled 2017-07-21 (×3): qty 1

## 2017-07-21 NOTE — Progress Notes (Signed)
SATURATION QUALIFICATIONS: (This note is used to comply with regulatory documentation for home oxygen)  Patient Saturations on Room Air at Rest = 98%  Patient Saturations on Room Air while Ambulating = 100%   Please briefly explain why patient needs home oxygen:Not indicated

## 2017-07-21 NOTE — Discharge Summary (Signed)
Name: [name] MRN: [id number] DOB: 1966-08-12 51 y.o. PCP: Crystal Coots, NP  Date of Admission: 07/20/2017 10:48 AM Date of Discharge: 07/22/2017 Attending Physician: Aldine Contes, MD  Discharge Diagnosis: 1. Asthma Exacerbation  2. Hypertension  3. Diabetes Mellitus   Active Problems:   Essential hypertension   Diabetes mellitus ([place])   Asthma exacerbation  Discharge Medications: Allergies as of 07/22/2017   No Known Allergies     Medication List    STOP taking these medications   HYDROcodone-acetaminophen 5-325 MG tablet Commonly known as:  NORCO/VICODIN     TAKE these medications   albuterol 108 (90 Base) MCG/ACT inhaler Commonly known as:  PROVENTIL HFA;VENTOLIN HFA Inhale 1-2 puffs into the lungs every 6 (six) hours as needed for wheezing or shortness of breath.   albuterol (2.5 MG/3ML) 0.083% nebulizer solution Commonly known as:  PROVENTIL Take 3 mLs (2.5 mg total) by nebulization every 2 (two) hours as needed for wheezing or shortness of breath.   beclomethasone 80 MCG/ACT inhaler Commonly known as:  QVAR Inhale 1 puff into the lungs 2 (two) times daily.   chlorpheniramine-HYDROcodone 10-8 MG/5ML Suer Commonly known as:  TUSSIONEX Take 5 mLs by mouth every 12 (twelve) hours. What changed:  when to take this  reasons to take this   cyclobenzaprine 10 MG tablet Commonly known as:  FLEXERIL Take 1 tablet (10 mg total) by mouth 2 (two) times daily as needed for muscle spasms.   diclofenac sodium 1 % Gel Commonly known as:  VOLTAREN Apply 2 g topically 4 (four) times daily. What changed:  when to take this  reasons to take this   doxycycline 100 MG tablet Commonly known as:  VIBRA-TABS Take 1 tablet (100 mg total) by mouth every 12 (twelve) hours.   fluticasone 50 MCG/ACT nasal spray Commonly known as:  FLONASE Place 2 sprays into both nostrils daily.   gabapentin 300 MG capsule Commonly known as:  NEURONTIN Take 1 capsule  (300 mg total) by mouth 2 (two) times daily.   glipiZIDE 5 MG tablet Commonly known as:  GLUCOTROL Take 1 tablet (5 mg total) by mouth every morning.   guaiFENesin 600 MG 12 hr tablet Commonly known as:  MUCINEX Take 600 mg by mouth 2 (two) times daily as needed for cough.   lisinopril 5 MG tablet Commonly known as:  PRINIVIL,ZESTRIL Take 1 tablet (5 mg total) by mouth daily.   living well with diabetes book Misc 1 each by Does not apply route once.   loratadine 10 MG tablet Commonly known as:  CLARITIN Take 1 tablet (10 mg total) by mouth daily.   omeprazole 20 MG capsule Commonly known as:  PRILOSEC Take 2 capsules (40 mg total) by mouth daily.   polyethylene glycol packet Commonly known as:  MIRALAX / GLYCOLAX Take 17 g by mouth daily as needed for mild constipation.   predniSONE 20 MG tablet Commonly known as:  DELTASONE Take 2 tablets (40 mg total) by mouth daily with breakfast.   sertraline 50 MG tablet Commonly known as:  ZOLOFT Take 75 mg by mouth every morning.      Disposition and follow-up:   [name] was discharged from [facility] in Stable condition.  At the hospital follow up visit please address:  1.  -Please evaluate for an escalation in therapy for her uncontrolled asthma. -Discuss marijuana cessation.  -Ensure she knows how to properly use her inhalers.  -Consider escalating DM therapy (  A1c 9.0%, only on glipizide 5 mg daily at home)  2.  Labs / imaging needed at time of follow-up: N/A  3.  Pending labs/ test needing follow-up: N/A  Follow-up Appointments: Follow-up Information    Placey, Audrea Muscat, NP Follow up.   Why:  Please go to your scheduled appointment with your primary care doctor  Contact information: [place] [address] [phone]           Hospital Course by problem list: Active Problems:   Essential hypertension   Diabetes mellitus ([place])   Asthma exacerbation   1.  Asthma Exacerbation. [name] with a PMHx significant for Asthma treated with Albuterol prn and Beclomethasone 1 puff BID who presented to the ED with SOB of 1 day(s) duration. She was initially treated with Albuterol 10 mg, Atrovent 1 mg, EpiPen, and 125 mg Solu-medrol by EMS. She was given a total of 15 mg Albuterol in the ED subsequently entering SVT (HR 160), she was given adenosine and placed on BiPAP. She was transferred to the floor on IV solu-medrol and scheduled breathing treatments. She was transitioned to oral steroids and weaned off oxygen over the course of 2 days. UDS was positive for tetrahydrocannabinol on admission. She endorses the use of marijuana 2x weekly. She was interest and motivated to quit. She was discharged on oral prednisone x 4 days (end 07/25/2017) ,doxycycline (end 07/24/2017 for 5 day course), and her home inhalers. She was given instructions to follow-up with her PCP for evaluation of her uncontrolled asthma.   2. Hypertension. Stable during admission, continued on home Lisinopril 5 mg daily.   3. Diabetes Mellitus. Stable and managed with SSI during admission in the setting of steroid use for asthma exacerbation. On home regimen of glipizide 5 mg daily. A1c 9.0% on admission. Prior to discharge, pt counseled and given information on lifestyle modifications including dietary changes and exercise, and need for follow up. Consider escalating therapy at follow up.    Discharge Vitals:   BP (!) 143/107   Pulse 71   Temp 97.8 F (36.6 C) (Axillary)   Resp 17   Ht 5\' 1"  (1.549 m)   Wt 186 lb 14.4 oz (84.8 kg)   SpO2 99%   BMI 35.31 kg/m   Pertinent Labs, Studies, and Procedures:  Chest X-ray  IMPRESSION: No active disease.  Discharge Instructions: Discharge Instructions    Diet - low sodium heart healthy    Complete by:  As directed    Discharge instructions    Complete by:  As directed    -Take Prednisone 40 mg each day until Aug 4th (four  days total including dose today) -Take Doxycycline 100 mg two times a day until Aug 3rd.  -See your pulmonologist on at [facility] on August 14th at 11 am -Go to your appointment with your primary care doctor as well. It well be important to talk about how to improve your diabetes   Increase activity slowly    Complete by:  As directed       Signed: Tawny Asal, MD 07/22/2017, 3:05 PM   My Pager: [phone]

## 2017-07-21 NOTE — Consult Note (Deleted)
Name: [name] MRN: [id number] DOB: Jan 27, 1966    ADMISSION DATE:  07/20/2017 CONSULTATION DATE:  07/20/2017  REFERRING MD : Dr. Fredric Dine  CHIEF COMPLAINT:  SOB  HISTORY OF PRESENT ILLNESS:  51 year old female with PMH as below, which is significant for [place] (with recent admits for this, never intubated), HTN, and depression. She was in her usual state of health until 7/29 when she developed a minimally productive cough. Productive for green/black sputum. Denies associated fevers/chills. No known sick contacts. When she awoke from sleep 7/30 she was markedly dyspneic. She took her Qvar inhaler without relief. Unclear if she tried albuterol. She called EMS when flare would not break. EMS noted her to be hypoxemic with O2 sats in 70s on room air. She was given 10 mg albuterol, 1 mg Atrovent, EpiPen, 125 mg solumedrol by EMS.  Upon arrival to ED she was placed on BiPAP and was given additional albuterol.. She did become markedly tachycardic to 160s and was given 6mg  of adenosine, which was not effective, but her HR did eventually recover into the 120s.   SIGNIFICANT EVENTS:    STUDIES:  PFT 05/2017   SUBJECTIVE:  Feeling better. Chest still tight.   VITAL SIGNS: Temp:  [97.6 F (36.4 C)-98.7 F (37.1 C)] 98.5 F (36.9 C) (07/31 0418) Pulse Rate:  [96-167] 99 (07/31 0418) Resp:  [13-28] 18 (07/31 0500) BP: (118-199)/(70-119) 122/73 (07/31 0418) SpO2:  [96 %-100 %] 97 % (07/31 0500) FiO2 (%):  [3 %-70 %] 3 % (07/31 0418) Weight:  [84.8 kg (187 lb)] 84.8 kg (187 lb) (07/30 1053)  PHYSICAL EXAMINATION: General:  Middle aged female in NAD Neuro:  Alert, oriented, non-focal HEENT:  [place]/AT, PERRL, no JVD Cardiovascular:  RRR, no MRG Lungs:  Poor air movement. No wheeze Abdomen:  Soft, non-tender, non-distended Musculoskeletal:  No acute deformity Skin:  Grossly intact   Recent Labs Lab 07/20/17 1052 07/21/17 0656  NA 141 137  K 3.8 4.7  CL 108 107  CO2 24 20*    BUN 13 11  CREATININE 0.99 1.08*  GLUCOSE 220* 428*    Recent Labs Lab 07/20/17 1052  HGB 15.0  HCT 47.9*  WBC 8.5  PLT 271   Dg Chest Port 1 View  Result Date: 07/20/2017 CLINICAL DATA:  Short of breath EXAM: PORTABLE CHEST 1 VIEW COMPARISON:  06/12/2017 FINDINGS: The heart size and mediastinal contours are within normal limits. Both lungs are clear. The visualized skeletal structures are unremarkable. IMPRESSION: No active disease. Electronically Signed   By: Franchot Gallo M.D.   On: 07/20/2017 11:19    ASSESSMENT / PLAN:  51 year old female with asthma and recent admissions for this. Presented with dyspnea 7/30 requiring BiPAP. Improved with steroids, albuterol, epi-pen, and BiPAP in ED. Admitted to IMTS for asthma exacerbation. Treated with nebs and continued steroids. This is her 3rd hospitalization for this in the past 6 months, however, reports only using rescue inhaler about once per week.   Plan: Continue scheduled ipratropium, levalbuterol nebulized. PRN levalbuetrol Holding home Qvar. Will need education on rescue vs maintenance medications at discharge Scheduled solumedrol 40mg  q6 hours and transition to prednisone 8/1 PO doxycycline for a 5 day course. Started 7/30. Continue home fluticasone nasal spray and loratadine Pepcid 20mg  BID Discussed need to stop smoking marijuana. She understands and voices a commitment to doing this.  Respiratory viral panel pending Will need insulin coverage while on steroids. Per primary.  Georgann Housekeeper, AGACNP-BC [place]  Pulmonology/Critical Care Pager [phone] or [phone]  07/21/2017 9:14 AM

## 2017-07-21 NOTE — Progress Notes (Signed)
Pt assisted to ambulate approx 500 ft on RA using RW.  Distance limited by pain/swelling in left knee, chronic issue.  Pt states she has fallen several times at home due to same.  Pt expresses feeling SOB on return to room, however this appears to be from deconditioning.  Pt appreciative, family at bedside, will con't plan of care.

## 2017-07-21 NOTE — Progress Notes (Signed)
Name: [name] MRN: [id number] DOB: 06-Sep-1966    ADMISSION DATE:  07/20/2017 CONSULTATION DATE:  07/20/2017  REFERRING MD : Dr. Fredric Dine  CHIEF COMPLAINT:  SOB  HISTORY OF PRESENT ILLNESS:  51 year old female with PMH as below, which is significant for [place] (with recent admits for this, never intubated), HTN, and depression. She was in her usual state of health until 7/29 when she developed a minimally productive cough. Productive for green/black sputum. Denies associated fevers/chills. No known sick contacts. When she awoke from sleep 7/30 she was markedly dyspneic. She took her Qvar inhaler without relief. Unclear if she tried albuterol. She called EMS when flare would not break. EMS noted her to be hypoxemic with O2 sats in 70s on room air. She was given 10 mg albuterol, 1 mg Atrovent, EpiPen, 125 mg solumedrol by EMS.  Upon arrival to ED she was placed on BiPAP and was given additional albuterol.. She did become markedly tachycardic to 160s and was given 6mg  of adenosine, which was not effective, but her HR did eventually recover into the 120s.   SIGNIFICANT EVENTS:    STUDIES:  PFT 05/2017   SUBJECTIVE:  Feeling better. Chest still tight.   VITAL SIGNS: Temp:  [97.6 F (36.4 C)-98.7 F (37.1 C)] 98.5 F (36.9 C) (07/31 0418) Pulse Rate:  [96-167] 99 (07/31 0418) Resp:  [13-28] 18 (07/31 0500) BP: (118-199)/(70-119) 122/73 (07/31 0418) SpO2:  [96 %-100 %] 97 % (07/31 0500) FiO2 (%):  [3 %-70 %] 3 % (07/31 0418) Weight:  [84.8 kg (187 lb)] 84.8 kg (187 lb) (07/30 1053)  PHYSICAL EXAMINATION: General:  Middle aged female in NAD Neuro:  Alert, oriented, non-focal HEENT:  [place]/AT, PERRL, no JVD Cardiovascular:  RRR, no MRG Lungs:  Poor air movement. No wheeze Abdomen:  Soft, non-tender, non-distended Musculoskeletal:  No acute deformity Skin:  Grossly intact   Recent Labs Lab 07/20/17 1052 07/21/17 0656  NA 141 137  K 3.8 4.7  CL 108 107  CO2 24 20*    BUN 13 11  CREATININE 0.99 1.08*  GLUCOSE 220* 428*    Recent Labs Lab 07/20/17 1052  HGB 15.0  HCT 47.9*  WBC 8.5  PLT 271   Dg Chest Port 1 View  Result Date: 07/20/2017 CLINICAL DATA:  Short of breath EXAM: PORTABLE CHEST 1 VIEW COMPARISON:  06/12/2017 FINDINGS: The heart size and mediastinal contours are within normal limits. Both lungs are clear. The visualized skeletal structures are unremarkable. IMPRESSION: No active disease. Electronically Signed   By: Franchot Gallo M.D.   On: 07/20/2017 11:19    ASSESSMENT / PLAN:  51 year old female with asthma and recent admissions for this. Presented with dyspnea 7/30 requiring BiPAP. Improved with steroids, albuterol, epi-pen, and BiPAP in ED. Admitted to IMTS for asthma exacerbation. Treated with nebs and continued steroids. This is her 3rd hospitalization for this in the past 6 months, however, reports only using rescue inhaler about once per week.   Plan: Continue scheduled ipratropium, levalbuterol nebulized. PRN levalbuetrol Holding home Qvar. Will need education on rescue vs maintenance medications at discharge Scheduled solumedrol 40mg  q6 hours and transition to prednisone 8/1 PO doxycycline for a 5 day course. Started 7/30. Continue home fluticasone nasal spray and loratadine Pepcid 20mg  BID Discussed need to stop smoking marijuana. She understands and voices a commitment to doing this.  Respiratory viral panel pending Will need insulin coverage while on steroids. Per primary.  Georgann Housekeeper, AGACNP-BC [place]  Pulmonology/Critical Care Pager [phone] or [phone]  07/21/2017 9:37 AM

## 2017-07-21 NOTE — Progress Notes (Signed)
   Subjective: Mrs. [name] is doing well over the interval, currently breathing well on 3L/min [place]. She endorses smoking marijuana approximately 2x per week. Discussed that this is not helping her asthma and she is agreeable to cessation. Discussed that we will keep her here today with plans for reevaluation and possible discharge tomorrow. Discussed the need to follow-up with her PCP on 08/03 to ensure she is using her medication properly and possibly step up therapy. She has no questions or concerns.   Objective: Vital signs in last 24 hours: Vitals:   07/21/17 0300 07/21/17 0400 07/21/17 0418 07/21/17 0500  BP:   122/73   Pulse:   99   Resp:  (!) 24 20 18   Temp:   98.5 F (36.9 C)   TempSrc:   Oral   SpO2: 100% 98% 100% 97%  Weight:      Height:       Physical Exam  Constitutional: She is oriented to person, place, and time. She appears well-developed and well-nourished. No distress.  HENT:  Head: Normocephalic and atraumatic.  Eyes: Pupils are equal, round, and reactive to light. Conjunctivae are normal.  Cardiovascular: Normal rate, regular rhythm, normal heart sounds and intact distal pulses.   Pulmonary/Chest: Effort normal.  Upper airway wheezing, lungs are clear to auscultation  Abdominal: Soft. Bowel sounds are normal. She exhibits no distension. There is no tenderness. There is no guarding.  Musculoskeletal: She exhibits no edema.  Neurological: She is alert and oriented to person, place, and time.  Psychiatric: She has a normal mood and affect. Her behavior is normal.   Assessment/Plan:  Active Problems:   Essential hypertension   Diabetes mellitus (HCC)   Asthma exacerbation  1. Asthma Exacerbation  - No recent illness, several exacerbations in the past 3 months, on Albuterol and QVAR at home  - CXR unremarkable - Viral respiratory panel negative  - UDS + tetrahydrocannabinol, patient endorses use  - PCCM following; Will decrease methylprednisolone and plan to  switch to PO Prednisone tomorrow  - Continue scheduled ipratropium/levalbuterol q4hrs - Continue Po doxy (day 2 of 5) - Wean oxygen requirements, and check walking pulse ox  2. Hypertension  - Continue Lisinopril 5 mg PO  3. Diabetes Mellitus  - Sliding Scale-Moderate  Dispo: Anticipated discharge in approximately 1 day(s).   Ina Homes, MD 07/21/2017, 11:10 AM My Pager: [phone]

## 2017-07-21 NOTE — Progress Notes (Signed)
Inpatient Diabetes Program Recommendations  AACE/ADA: New Consensus Statement on Inpatient Glycemic Control (2015)  Target Ranges:  Prepandial:   less than 140 mg/dL      Peak postprandial:   less than 180 mg/dL (1-2 hours)      Critically ill patients:  140 - 180 mg/dL   Lab Results  Component Value Date   GLUCAP 204 (H) 07/21/2017   HGBA1C 7.70 01/07/2016    Review of Glycemic ControlResults for ASHAWNA, HANBACK (MRN [id number]) as of 07/21/2017 12:21  Ref. Range 07/20/2017 17:35 07/20/2017 22:16 07/21/2017 06:23 07/21/2017 11:17  Glucose-Capillary Latest Ref Range: 65 - 99 mg/dL 336 (H) 314 (H) 378 (H) 204 (H)    Diabetes history: Type 2 diabetes Outpatient Diabetes medications: Glucotrol 2.5 mg daily Current orders for Inpatient glycemic control:  Novolog resistant tid with meals and HS Solumedrol 40 mg IV q 6 hours then to Prednisone 40 mg daily starting 07/22/17 Inpatient Diabetes Program Recommendations:    A1C pending.  If CBG's remain >180 mg/dL, consider adding Levemir 16 units daily while on steroids.   Thanks, Adah Perl, RN, BC-ADM Inpatient Diabetes Coordinator Pager [phone]  (8a-5p)

## 2017-07-21 NOTE — Progress Notes (Signed)
PHARMACIST - PHYSICIAN COMMUNICATION CONCERNING: Antibiotic IV to Oral Route Change Policy  RECOMMENDATION: This patient is receiving doxycycline by the intravenous route.  Based on criteria approved by the Pharmacy and Therapeutics Committee, the antibiotic(s) is/are being converted to the equivalent oral dose form(s).   DESCRIPTION: These criteria include:  Patient being treated for a respiratory tract infection, urinary tract infection, cellulitis or clostridium difficile associated diarrhea if on metronidazole  The patient is not neutropenic and does not exhibit a GI malabsorption state  The patient is eating (either orally or via tube) and/or has been taking other orally administered medications for a least 24 hours  The patient is improving clinically and has a Tmax < 100.5  If you have questions about this conversion, please contact the Pharmacy Department  []   [phone] )  [facility] [x]   [phone] )  [facility]  []   [phone] )  [facility] []   [phone] )  [place]. Gerarda Fraction, PharmD PGY1 Pharmacy Resident Pager: [phone]

## 2017-07-21 NOTE — Progress Notes (Signed)
PHARMACIST - PHYSICIAN COMMUNICATION CONCERNING: Antibiotic IV to Oral Route Change Policy  RECOMMENDATION: This patient is receiving doxycycline by the intravenous route.  Based on criteria approved by the Pharmacy and Therapeutics Committee, the antibiotic(s) is/are being converted to the equivalent oral dose form(s).   DESCRIPTION: These criteria include:  Patient being treated for a respiratory tract infection, urinary tract infection, cellulitis or clostridium difficile associated diarrhea if on metronidazole  The patient is not neutropenic and does not exhibit a GI malabsorption state  The patient is eating (either orally or via tube) and/or has been taking other orally administered medications for a least 24 hours  The patient is improving clinically and has a Tmax < 100.5  If you have questions about this conversion, please contact the Pharmacy Department  []   [phone] )  [facility] [x]   [phone] )  [facility]  []   [phone] )  [facility] []   [phone] )  [place]. Gerarda Fraction, PharmD PGY1 Pharmacy Resident Pager: [phone]

## 2017-07-22 LAB — GLUCOSE, CAPILLARY
GLUCOSE-CAPILLARY: 335 mg/dL — AB (ref 65–99)
GLUCOSE-CAPILLARY: 295 mg/dL — AB (ref 65–99)
GLUCOSE-CAPILLARY: 456 mg/dL — AB (ref 65–99)

## 2017-07-22 LAB — HEMOGLOBIN A1C
Hgb A1c MFr Bld: 9 % — ABNORMAL HIGH (ref 4.8–5.6)
MEAN PLASMA GLUCOSE: 212 mg/dL

## 2017-07-22 LAB — BASIC METABOLIC PANEL
Anion gap: 9 (ref 5–15)
BUN: 22 mg/dL — ABNORMAL HIGH (ref 6–20)
CHLORIDE: 103 mmol/L (ref 101–111)
CO2: 23 mmol/L (ref 22–32)
CREATININE: 1.3 mg/dL — AB (ref 0.44–1.00)
Calcium: 9.7 mg/dL (ref 8.9–10.3)
GFR, EST AFRICAN AMERICAN: 54 mL/min — AB (ref >60)
GFR, EST NON AFRICAN AMERICAN: 47 mL/min — AB (ref >60)
Glucose, Bld: 390 mg/dL — ABNORMAL HIGH (ref 65–99)
POTASSIUM: 4.7 mmol/L (ref 3.5–5.1)
SODIUM: 135 mmol/L (ref 135–145)

## 2017-07-22 MED ORDER — LIVING WELL WITH DIABETES BOOK
Freq: Once | Status: AC
  Administered 2017-07-22: 13:00:00
  Filled 2017-07-22: qty 1

## 2017-07-22 MED ORDER — PREDNISONE 20 MG PO TABS: 40.0000 mg | ORAL_TABLET | Freq: Every day | ORAL | 0 refills | Status: AC

## 2017-07-22 MED ORDER — INSULIN ASPART 100 UNIT/ML ~~LOC~~ SOLN
2.0000 [IU] | Freq: Once | SUBCUTANEOUS | Status: AC
  Administered 2017-07-22: 2 [IU] via SUBCUTANEOUS

## 2017-07-22 MED ORDER — LIVING WELL WITH DIABETES BOOK: 1.0000 | Freq: Once | 0 refills | Status: AC

## 2017-07-22 MED ORDER — GLIPIZIDE 5 MG PO TABS: 5.0000 mg | ORAL_TABLET | Freq: Every morning | ORAL | Status: DC

## 2017-07-22 MED ORDER — DOXYCYCLINE HYCLATE 100 MG PO TABS: 100.0000 mg | ORAL_TABLET | Freq: Two times a day (BID) | ORAL | 0 refills | Status: AC

## 2017-07-22 NOTE — Progress Notes (Signed)
Pt discharged to home with daughter, all discharged instructions reveiwed with patient and family. All parties verbalized understanding. Patient is in possession of all belongings.

## 2017-07-22 NOTE — Progress Notes (Signed)
SATURATION QUALIFICATIONS: (This note is used to comply with regulatory documentation for home oxygen)  Patient Saturations on Room Air at Rest = 100%  Patient Saturations on Room Air while Ambulating = 99%  Patient Saturations on 0 Liters of oxygen while Ambulating = 99%  Please briefly explain why patient needs home oxygen: pt does not need home oxygen

## 2017-07-22 NOTE — Progress Notes (Signed)
Inpatient Diabetes Program Recommendations  AACE/ADA: New Consensus Statement on Inpatient Glycemic Control (2015)  Target Ranges:  Prepandial:   less than 140 mg/dL      Peak postprandial:   less than 180 mg/dL (1-2 hours)      Critically ill patients:  140 - 180 mg/dL   Lab Results  Component Value Date   GLUCAP 295 (H) 07/22/2017   HGBA1C 9.0 (H) 07/20/2017    Review of Glycemic ControlResults for Crystal, [name] (MRN [id number]) as of 07/22/2017 13:22  Ref. Range 07/21/2017 11:17 07/21/2017 16:29 07/21/2017 21:14 07/22/2017 06:13 07/22/2017 11:24  Glucose-Capillary Latest Ref Range: 65 - 99 mg/dL 204 (H) 254 (H) 218 (H) 335 (H) 295 (H)   Diabetes history: Type 2 diabetes Outpatient Diabetes medications: Glucotrol 2.5 mg daily Current orders for Inpatient glycemic control:  Novolog resistant tid with meals and HS, Prednisone 40 mg daily  Inpatient Diabetes Program Recommendations:    Spoke briefly with patient by phone regarding elevated A1C.  Explained that this corresponds to average blood sugar of approximately 210 mg/dL.  She states that she has only been on Glucotrol in the past for her diabetes but has appointment with PCP on Friday and would like to share A1C results with her.  I also ordered Living well with diabetes booklet for patient to take home for further reading information regarding diabetes.  We did discuss that steroids can raise blood sugars as well .  Patient appreciative of information.   Thanks, Adah Perl, RN, BC-ADM Inpatient Diabetes Coordinator Pager [phone] (8a-5p)

## 2017-07-22 NOTE — Progress Notes (Signed)
MD paged for additional insulin orders. Patient diet has been changed to carb modified instead of heart healthy.

## 2017-07-22 NOTE — Plan of Care (Signed)
Problem: Bowel/Gastric: Goal: Will not experience complications related to bowel motility Outcome: Progressing Patient agrees to take PRN miralax to assist with BM

## 2017-07-22 NOTE — Care Management Note (Addendum)
Case Management Note Marvetta Gibbons RN, BSN Unit 4E-Case Manager [phone]  Patient Details  Name: [name] MRN: [id number] Date of Birth: 12-03-1966  Subjective/Objective:    Pt admitted with Asthma exacerbation                 Action/Plan: PTA pt lived at home- has home nebulizer, pt has orange card for medications uses [facility], has followed by [facility] in past, and seen by Marliss Coots NP [facility] [place])- pt states she has appointment with PCP this Friday.  No CM needs noted for discharge.   Expected Discharge Date:  07/22/17               Expected Discharge Plan:  Home/Self Care  In-House Referral:     Discharge planning Services  CM Consult  Post Acute Care Choice:  NA Choice offered to:  NA  DME Arranged:    DME Agency:     HH Arranged:    HH Agency:     Status of Service:  Completed, signed off  If discussed at [place] of Stay Meetings, dates discussed:    Discharge Disposition: home/ self care   Additional Comments:  Dawayne Patricia, RN 07/22/2017, 3:16 PM

## 2017-07-22 NOTE — Discharge Summary (Signed)
Medicine attending: I personally examined this patient on the day of discharge and I attest to the accuracy of the discharge evaluation and plan as recorded by resident physician Dr. Tawny Asal.  51 year old woman with known asthma who presented with a 24 hour history of increased dyspnea and wheezing.  On arrival in the emergency department she was treated with polypharmacy including bronchodilators, steroids, Atrovent, and epinephrine.  She developed a supraventricular tachycardia requiring administration of adenosine.  She was temporarily put on BiPAP.  Chest x-ray showed no infiltrate or effusion.  There were no signs of active infection.  Viral upper respiratory panel negative.  Patient admitted to intermittent marijuana use which may be exacerbating her asthma. Her symptoms subsided with ongoing treatment.  She still had anterior superior chest tightness with expiratory wheezing intermittently on the morning of discharge but felt she was back to her baseline.  She was observed over the next few hours and remained stable.  Disposition: Condition stable at time of discharge Complications: Transient rapid tachycardia related to bronchodilators and epinephrine

## 2017-07-22 NOTE — Progress Notes (Signed)
   Subjective: [name] reports no acute events overnight with subjective improvement in her breathing, however, when taking deep breaths for exam this morning she had increased coughing with wheezing. Last breathing treatment was 8 pm night prior and she was due for her next scheduled nebulizers at time of exam.    Objective: Vital signs in last 24 hours: Vitals:   07/21/17 2030 07/21/17 2339 07/22/17 0011 07/22/17 0413  BP: (!) 151/97  (!) 145/90 (!) 142/96  Pulse: (!) 121  92 71  Resp: (!) 23  (!) 23 (!) 23  Temp: 98.7 F (37.1 C) 98.5 F (36.9 C) 98.3 F (36.8 C) 97.9 F (36.6 C)  TempSrc: Oral Oral Oral Oral  SpO2: 99%  93% 94%  Weight:    186 lb 14.4 oz (84.8 kg)  Height:       Physical Exam  Constitutional: She is oriented to person, place, and time. She appears well-developed and well-nourished.  HENT:  Head: Normocephalic and atraumatic.  Cardiovascular: Normal rate, regular rhythm, normal heart sounds and intact distal pulses.   Pulmonary/Chest:  Diffuse wheezes bilaterally with increased effort and coughing during exam   Abdominal: Soft. She exhibits no distension. There is no tenderness. There is no guarding.  Musculoskeletal: She exhibits no edema.  Neurological: She is alert and oriented to person, place, and time.  Skin: Skin is warm and dry.  Psychiatric: She has a normal mood and affect. Her behavior is normal.   Assessment/Plan:  Active Problems:   Essential hypertension   Diabetes mellitus (HCC)   Asthma exacerbation  1. Asthma Exacerbation  - No recent illness, several exacerbations in the past 3 months, on Albuterol and QVAR at home  - CXR unremarkable - Viral respiratory panel negative  - UDS + tetrahydrocannabinol, patient endorses use  - PCCM evaluated pt during admission; Start PO Prednisone 40 mg x 4 days  - Continue scheduled ipratropium/levalbuterol nebulizer QID - Continue Po doxy (day 3 of 5) - Continue room air as tolerated, check  walking pulse ox  2. Hypertension  - Continue Lisinopril 5 mg PO  3. Diabetes Mellitus  - Sliding Scale-Moderate  Dispo: Anticipated discharge today   Tawny Asal, MD 07/22/2017, 9:00 AM My Pager: [phone]

## 2017-07-30 ENCOUNTER — Emergency Department (HOSPITAL_COMMUNITY): Payer: Self-pay

## 2017-07-30 ENCOUNTER — Encounter (HOSPITAL_COMMUNITY): Payer: Self-pay | Admitting: Emergency Medicine

## 2017-07-30 ENCOUNTER — Inpatient Hospital Stay (HOSPITAL_COMMUNITY)
Admission: EM | Admit: 2017-07-30 | Discharge: 2017-08-02 | DRG: 202 | Disposition: A | Discharge status: Home / Self Care | Payer: Self-pay | Attending: Oncology | Admitting: Oncology

## 2017-07-30 DIAGNOSIS — Z87891 Personal history of nicotine dependence: Secondary | ICD-10-CM

## 2017-07-30 DIAGNOSIS — J45901 Unspecified asthma with (acute) exacerbation: Secondary | ICD-10-CM | POA: Diagnosis present

## 2017-07-30 DIAGNOSIS — F329 Major depressive disorder, single episode, unspecified: Secondary | ICD-10-CM | POA: Diagnosis present

## 2017-07-30 DIAGNOSIS — E119 Type 2 diabetes mellitus without complications: Secondary | ICD-10-CM

## 2017-07-30 DIAGNOSIS — K5900 Constipation, unspecified: Secondary | ICD-10-CM | POA: Diagnosis not present

## 2017-07-30 DIAGNOSIS — Z7951 Long term (current) use of inhaled steroids: Secondary | ICD-10-CM

## 2017-07-30 DIAGNOSIS — E1165 Type 2 diabetes mellitus with hyperglycemia: Secondary | ICD-10-CM | POA: Diagnosis present

## 2017-07-30 DIAGNOSIS — T380X5A Adverse effect of glucocorticoids and synthetic analogues, initial encounter: Secondary | ICD-10-CM | POA: Diagnosis present

## 2017-07-30 DIAGNOSIS — I471 Supraventricular tachycardia: Secondary | ICD-10-CM | POA: Diagnosis present

## 2017-07-30 DIAGNOSIS — J4531 Mild persistent asthma with (acute) exacerbation: Secondary | ICD-10-CM

## 2017-07-30 DIAGNOSIS — J4542 Moderate persistent asthma with status asthmaticus: Principal | ICD-10-CM

## 2017-07-30 DIAGNOSIS — E1140 Type 2 diabetes mellitus with diabetic neuropathy, unspecified: Secondary | ICD-10-CM | POA: Diagnosis present

## 2017-07-30 DIAGNOSIS — J029 Acute pharyngitis, unspecified: Secondary | ICD-10-CM | POA: Diagnosis present

## 2017-07-30 DIAGNOSIS — B370 Candidal stomatitis: Secondary | ICD-10-CM | POA: Diagnosis present

## 2017-07-30 DIAGNOSIS — Z79899 Other long term (current) drug therapy: Secondary | ICD-10-CM

## 2017-07-30 DIAGNOSIS — J45909 Unspecified asthma, uncomplicated: Secondary | ICD-10-CM | POA: Diagnosis present

## 2017-07-30 DIAGNOSIS — Z7984 Long term (current) use of oral hypoglycemic drugs: Secondary | ICD-10-CM

## 2017-07-30 DIAGNOSIS — I10 Essential (primary) hypertension: Secondary | ICD-10-CM | POA: Diagnosis present

## 2017-07-30 DIAGNOSIS — E876 Hypokalemia: Secondary | ICD-10-CM | POA: Diagnosis present

## 2017-07-30 DIAGNOSIS — E1149 Type 2 diabetes mellitus with other diabetic neurological complication: Secondary | ICD-10-CM

## 2017-07-30 LAB — CBC WITH DIFFERENTIAL/PLATELET
BASOS ABS: 0 10*3/uL (ref 0.0–0.1)
Basophils Relative: 0 %
EOS PCT: 0 %
Eosinophils Absolute: 0 10*3/uL (ref 0.0–0.7)
HEMATOCRIT: 39.1 % (ref 36.0–46.0)
HEMOGLOBIN: 12.8 g/dL (ref 12.0–15.0)
LYMPHS ABS: 1.8 10*3/uL (ref 0.7–4.0)
LYMPHS PCT: 20 %
MCH: 29.2 pg (ref 26.0–34.0)
MCHC: 32.7 g/dL (ref 30.0–36.0)
MCV: 89.1 fL (ref 78.0–100.0)
Monocytes Absolute: 0.4 10*3/uL (ref 0.1–1.0)
Monocytes Relative: 5 %
NEUTROS ABS: 6.6 10*3/uL (ref 1.7–7.7)
Neutrophils Relative %: 75 %
PLATELETS: 228 10*3/uL (ref 150–400)
RBC: 4.39 MIL/uL (ref 3.87–5.11)
RDW: 14.2 % (ref 11.5–15.5)
WBC: 8.8 10*3/uL (ref 4.0–10.5)

## 2017-07-30 LAB — BASIC METABOLIC PANEL
ANION GAP: 9 (ref 5–15)
BUN: 12 mg/dL (ref 6–20)
CHLORIDE: 105 mmol/L (ref 101–111)
CO2: 25 mmol/L (ref 22–32)
CREATININE: 0.73 mg/dL (ref 0.44–1.00)
Calcium: 9.1 mg/dL (ref 8.9–10.3)
GFR calc non Af Amer: >60 mL/min (ref >60)
Glucose, Bld: 249 mg/dL — ABNORMAL HIGH (ref 65–99)
POTASSIUM: 3.4 mmol/L — AB (ref 3.5–5.1)
SODIUM: 139 mmol/L (ref 135–145)

## 2017-07-30 LAB — I-STAT TROPONIN, ED: Troponin i, poc: 0 ng/mL (ref 0.00–0.08)

## 2017-07-30 LAB — RAPID STREP SCREEN (MED CTR MEBANE ONLY): Streptococcus, Group A Screen (Direct): NEGATIVE

## 2017-07-30 LAB — GLUCOSE, CAPILLARY: Glucose-Capillary: 305 mg/dL — ABNORMAL HIGH (ref 65–99)

## 2017-07-30 MED ORDER — LOSARTAN POTASSIUM 50 MG PO TABS
50.0000 mg | ORAL_TABLET | Freq: Every day | ORAL | Status: DC
  Administered 2017-07-31 – 2017-08-02 (×3): 50 mg via ORAL
  Filled 2017-07-30 (×3): qty 1

## 2017-07-30 MED ORDER — ALBUTEROL SULFATE (2.5 MG/3ML) 0.083% IN NEBU
5.0000 mg | INHALATION_SOLUTION | Freq: Once | RESPIRATORY_TRACT | Status: AC
  Administered 2017-07-30: 5 mg via RESPIRATORY_TRACT

## 2017-07-30 MED ORDER — ALBUTEROL SULFATE (2.5 MG/3ML) 0.083% IN NEBU
5.0000 mg | INHALATION_SOLUTION | Freq: Once | RESPIRATORY_TRACT | Status: AC
  Administered 2017-07-30: 5 mg via RESPIRATORY_TRACT
  Filled 2017-07-30: qty 6

## 2017-07-30 MED ORDER — GLIPIZIDE 5 MG PO TABS: 5.0000 mg | ORAL_TABLET | Freq: Every morning | ORAL | Status: DC

## 2017-07-30 MED ORDER — GI COCKTAIL ~~LOC~~
30.0000 mL | Freq: Once | ORAL | Status: AC
  Administered 2017-07-31: 30 mL via ORAL
  Filled 2017-07-30: qty 30

## 2017-07-30 MED ORDER — ALBUTEROL SULFATE (2.5 MG/3ML) 0.083% IN NEBU
2.5000 mg | INHALATION_SOLUTION | Freq: Once | RESPIRATORY_TRACT | Status: AC
  Administered 2017-07-30: 2.5 mg via RESPIRATORY_TRACT
  Filled 2017-07-30: qty 3

## 2017-07-30 MED ORDER — POLYETHYLENE GLYCOL 3350 17 G PO PACK
17.0000 g | PACK | Freq: Every day | ORAL | Status: DC | PRN
  Administered 2017-07-31: 17 g via ORAL
  Filled 2017-07-30: qty 1

## 2017-07-30 MED ORDER — POTASSIUM CHLORIDE CRYS ER 20 MEQ PO TBCR
40.0000 meq | EXTENDED_RELEASE_TABLET | Freq: Once | ORAL | Status: AC
  Administered 2017-07-31: 40 meq via ORAL
  Filled 2017-07-30: qty 2

## 2017-07-30 MED ORDER — KETOROLAC TROMETHAMINE 30 MG/ML IJ SOLN: 30.0000 mg | Freq: Four times a day (QID) | INTRAMUSCULAR | Status: DC | PRN

## 2017-07-30 MED ORDER — PANTOPRAZOLE SODIUM 40 MG PO TBEC
40.0000 mg | DELAYED_RELEASE_TABLET | Freq: Every day | ORAL | Status: DC
  Administered 2017-07-31 – 2017-08-02 (×4): 40 mg via ORAL
  Filled 2017-07-30 (×4): qty 1

## 2017-07-30 MED ORDER — HYDROCOD POLST-CPM POLST ER 10-8 MG/5ML PO SUER
5.0000 mL | Freq: Two times a day (BID) | ORAL | Status: DC | PRN
  Administered 2017-07-30 – 2017-07-31 (×2): 5 mL via ORAL
  Filled 2017-07-30 (×2): qty 5

## 2017-07-30 MED ORDER — BUDESONIDE 0.25 MG/2ML IN SUSP
0.2500 mg | Freq: Two times a day (BID) | RESPIRATORY_TRACT | Status: DC
  Administered 2017-07-30 – 2017-08-02 (×6): 0.25 mg via RESPIRATORY_TRACT
  Filled 2017-07-30 (×6): qty 2

## 2017-07-30 MED ORDER — METHYLPREDNISOLONE SODIUM SUCC 125 MG IJ SOLR
60.0000 mg | Freq: Two times a day (BID) | INTRAMUSCULAR | Status: AC
  Administered 2017-07-31 (×3): 60 mg via INTRAVENOUS
  Filled 2017-07-30 (×3): qty 2

## 2017-07-30 MED ORDER — ALBUTEROL SULFATE (2.5 MG/3ML) 0.083% IN NEBU
2.5000 mg | INHALATION_SOLUTION | Freq: Four times a day (QID) | RESPIRATORY_TRACT | Status: DC
  Filled 2017-07-30: qty 3

## 2017-07-30 MED ORDER — ACETAMINOPHEN 325 MG PO TABS
650.0000 mg | ORAL_TABLET | Freq: Four times a day (QID) | ORAL | Status: DC | PRN
  Administered 2017-08-02: 650 mg via ORAL
  Filled 2017-07-30: qty 2

## 2017-07-30 MED ORDER — GABAPENTIN 300 MG PO CAPS
300.0000 mg | ORAL_CAPSULE | Freq: Two times a day (BID) | ORAL | Status: DC
  Administered 2017-07-31 – 2017-08-02 (×6): 300 mg via ORAL
  Filled 2017-07-30 (×6): qty 1

## 2017-07-30 MED ORDER — INSULIN ASPART 100 UNIT/ML ~~LOC~~ SOLN
0.0000 [IU] | Freq: Every day | SUBCUTANEOUS | Status: DC
  Administered 2017-07-31: 4 [IU] via SUBCUTANEOUS
  Administered 2017-08-01: 3 [IU] via SUBCUTANEOUS

## 2017-07-30 MED ORDER — FLUTICASONE PROPIONATE 50 MCG/ACT NA SUSP
2.0000 | Freq: Every day | NASAL | Status: DC
  Administered 2017-07-31 – 2017-08-02 (×3): 2 via NASAL
  Filled 2017-07-30: qty 16

## 2017-07-30 MED ORDER — GUAIFENESIN ER 600 MG PO TB12
600.0000 mg | ORAL_TABLET | Freq: Two times a day (BID) | ORAL | Status: DC | PRN
  Administered 2017-07-31 – 2017-08-02 (×3): 600 mg via ORAL
  Filled 2017-07-30 (×3): qty 1

## 2017-07-30 MED ORDER — ENOXAPARIN SODIUM 40 MG/0.4ML ~~LOC~~ SOLN
40.0000 mg | SUBCUTANEOUS | Status: DC
  Administered 2017-07-31 – 2017-08-02 (×3): 40 mg via SUBCUTANEOUS
  Filled 2017-07-30 (×3): qty 0.4

## 2017-07-30 MED ORDER — IOPAMIDOL (ISOVUE-300) INJECTION 61%
INTRAVENOUS | Status: AC
  Administered 2017-07-30: 75 mL
  Filled 2017-07-30: qty 75

## 2017-07-30 MED ORDER — SODIUM CHLORIDE 0.9 % IV SOLN
INTRAVENOUS | Status: AC
  Administered 2017-07-31: 01:00:00 via INTRAVENOUS

## 2017-07-30 MED ORDER — CYCLOBENZAPRINE HCL 10 MG PO TABS
10.0000 mg | ORAL_TABLET | Freq: Two times a day (BID) | ORAL | Status: DC | PRN
  Administered 2017-07-31: 10 mg via ORAL
  Filled 2017-07-30: qty 1

## 2017-07-30 MED ORDER — ALBUTEROL SULFATE (2.5 MG/3ML) 0.083% IN NEBU
2.5000 mg | INHALATION_SOLUTION | Freq: Two times a day (BID) | RESPIRATORY_TRACT | Status: DC
  Administered 2017-07-30 – 2017-08-02 (×6): 2.5 mg via RESPIRATORY_TRACT
  Filled 2017-07-30 (×5): qty 3

## 2017-07-30 MED ORDER — MENTHOL 3 MG MT LOZG: 1.0000 | LOZENGE | OROMUCOSAL | Status: DC | PRN

## 2017-07-30 MED ORDER — ALBUTEROL SULFATE (2.5 MG/3ML) 0.083% IN NEBU
INHALATION_SOLUTION | RESPIRATORY_TRACT | Status: AC
  Filled 2017-07-30: qty 6

## 2017-07-30 MED ORDER — PHENOL 1.4 % MT LIQD: 1.0000 | OROMUCOSAL | Status: DC | PRN

## 2017-07-30 MED ORDER — DICLOFENAC SODIUM 1 % TD GEL: 2.0000 g | Freq: Four times a day (QID) | TRANSDERMAL | Status: DC | PRN

## 2017-07-30 MED ORDER — MORPHINE SULFATE (PF) 4 MG/ML IV SOLN
4.0000 mg | Freq: Once | INTRAVENOUS | Status: AC
  Administered 2017-07-30: 4 mg via INTRAVENOUS
  Filled 2017-07-30: qty 1

## 2017-07-30 MED ORDER — INSULIN ASPART 100 UNIT/ML ~~LOC~~ SOLN
0.0000 [IU] | Freq: Three times a day (TID) | SUBCUTANEOUS | Status: DC
  Administered 2017-07-31: 11 [IU] via SUBCUTANEOUS
  Administered 2017-07-31: 2 [IU] via SUBCUTANEOUS
  Administered 2017-07-31 – 2017-08-01 (×2): 15 [IU] via SUBCUTANEOUS
  Administered 2017-08-01: 5 [IU] via SUBCUTANEOUS
  Administered 2017-08-01: 15 [IU] via SUBCUTANEOUS
  Administered 2017-08-02 (×2): 11 [IU] via SUBCUTANEOUS

## 2017-07-30 MED ORDER — ACETAMINOPHEN 650 MG RE SUPP: 650.0000 mg | Freq: Four times a day (QID) | RECTAL | Status: DC | PRN

## 2017-07-30 MED ORDER — SERTRALINE HCL 50 MG PO TABS
75.0000 mg | ORAL_TABLET | Freq: Every day | ORAL | Status: DC
  Administered 2017-07-31 – 2017-08-02 (×3): 75 mg via ORAL
  Filled 2017-07-30 (×3): qty 1

## 2017-07-30 MED ORDER — ALBUTEROL SULFATE (2.5 MG/3ML) 0.083% IN NEBU
2.5000 mg | INHALATION_SOLUTION | RESPIRATORY_TRACT | Status: DC | PRN
  Administered 2017-07-31 – 2017-08-02 (×5): 2.5 mg via RESPIRATORY_TRACT
  Filled 2017-07-30 (×6): qty 3

## 2017-07-30 MED ORDER — METHYLPREDNISOLONE SODIUM SUCC 125 MG IJ SOLR
125.0000 mg | Freq: Once | INTRAMUSCULAR | Status: AC
  Administered 2017-07-30: 125 mg via INTRAVENOUS
  Filled 2017-07-30: qty 2

## 2017-07-30 MED ORDER — LORATADINE 10 MG PO TABS
10.0000 mg | ORAL_TABLET | Freq: Every day | ORAL | Status: DC
  Administered 2017-07-31 – 2017-08-02 (×4): 10 mg via ORAL
  Filled 2017-07-30 (×3): qty 1

## 2017-07-30 NOTE — ED Notes (Signed)
Patient transported to CT 

## 2017-07-30 NOTE — ED Provider Notes (Signed)
Plains of sore throat with pain on swallowing. Also complains of wheezing and shortness of breath. Breathing is not baseline after treatment with intravenousSolu-Medrol and 3 nebulized treatments. On exam she speaks in sentences oropharynx is reddened lungs with prolonged expiratory phase with expiratory wheezes Chest x-ray viewed by me Results for orders placed or performed during the hospital encounter of 07/30/17  Rapid strep screen  Result Value Ref Range   Streptococcus, Group A Screen (Direct) NEGATIVE NEGATIVE  Basic metabolic panel  Result Value Ref Range   Sodium 139 135 - 145 mmol/L   Potassium 3.4 (L) 3.5 - 5.1 mmol/L   Chloride 105 101 - 111 mmol/L   CO2 25 22 - 32 mmol/L   Glucose, Bld 249 (H) 65 - 99 mg/dL   BUN 12 6 - 20 mg/dL   Creatinine, Ser 0.73 0.44 - 1.00 mg/dL   Calcium 9.1 8.9 - 10.3 mg/dL   GFR calc non Af Amer >60 >60 mL/min   GFR calc Af Amer >60 >60 mL/min   Anion gap 9 5 - 15  CBC with Differential  Result Value Ref Range   WBC 8.8 4.0 - 10.5 K/uL   RBC 4.39 3.87 - 5.11 MIL/uL   Hemoglobin 12.8 12.0 - 15.0 g/dL   HCT 39.1 36.0 - 46.0 %   MCV 89.1 78.0 - 100.0 fL   MCH 29.2 26.0 - 34.0 pg   MCHC 32.7 30.0 - 36.0 g/dL   RDW 14.2 11.5 - 15.5 %   Platelets 228 150 - 400 K/uL   Neutrophils Relative % 75 %   Neutro Abs 6.6 1.7 - 7.7 K/uL   Lymphocytes Relative 20 %   Lymphs Abs 1.8 0.7 - 4.0 K/uL   Monocytes Relative 5 %   Monocytes Absolute 0.4 0.1 - 1.0 K/uL   Eosinophils Relative 0 %   Eosinophils Absolute 0.0 0.0 - 0.7 K/uL   Basophils Relative 0 %   Basophils Absolute 0.0 0.0 - 0.1 K/uL  I-stat troponin, ED  Result Value Ref Range   Troponin i, poc 0.00 0.00 - 0.08 ng/mL   Comment 3           Dg Neck Soft Tissue  Result Date: 07/30/2017 CLINICAL DATA:  Sore throat with inability to swallow EXAM: NECK SOFT TISSUES - 1+ VIEW COMPARISON:  None. FINDINGS: Epiglottis appears normal. There is soft tissue thickening in the aryepiglottic region with  localized narrowing of the subglottic trachea. Prevertebral soft tissues are normal. No air-fluid level to suggest abscess. Tongue base region appears normal. Bony structures appear normal. Visualized upper lung zones appear normal. IMPRESSION: Soft tissue prominence involving the aryepiglottic folds with focal narrowing of the subglottic trachea. Epiglottis appears normal. Study otherwise unremarkable. The soft tissue prominence of the aryepiglottic folds and narrowing of the subglottic trachea may warrant contrast enhanced CT of the neck to further evaluate and/or ENT assessment given the patient's clinical symptoms. Electronically Signed   By: Lowella Grip III M.D.   On: 07/30/2017 14:04   Dg Chest 2 View  Result Date: 07/30/2017 CLINICAL DATA:  Difficulty swallowing for several days, coughing, throat pain, asthma, hypertension EXAM: CHEST  2 VIEW COMPARISON:  07/20/2017 FINDINGS: Normal heart size, mediastinal contours, and pulmonary vascularity. Lungs clear. No pleural effusion or pneumothorax. No acute osseous findings. IMPRESSION: No acute abnormalities. Electronically Signed   By: Lavonia Dana M.D.   On: 07/30/2017 13:27   Ct Soft Tissue Neck W Contrast  Result Date: 07/30/2017 CLINICAL DATA:  Sore throat/stridor. Tonsillitis/epiglottitis suspected. EXAM: CT NECK WITH CONTRAST TECHNIQUE: Multidetector CT imaging of the neck was performed using the standard protocol following the bolus administration of intravenous contrast. CONTRAST:  75 cc Isovue-300 intravenous COMPARISON:  Radiography from earlier today FINDINGS: Pharynx and larynx: No noted submucosal edema, epiglottic thickening or inflammatory enhancement. Tonsils not appear thickened. Negative for abscess. The infraglottic airway is widely patent and the trachea is not thickened. Salivary glands: No inflammation, mass, or stone. Thyroid: Enlarged right lobe with lower pole solid nodule measuring at least 23 mm. Lymph nodes: None enlarged or  abnormal density. Vascular: ICA tortuosity before the skullbase. Limited intracranial: Negative Visualized orbits: Negative for mass or inflammation. Mastoids and visualized paranasal sinuses: Clear Skeleton: No acute or aggressive finding Upper chest: Negative IMPRESSION: 1. No acute finding. No sign of tonsillitis/supraglottitis. Negative for abscess. 2. Right thyroid nodule measuring 2.3 cm. Recommend outpatient sonography. Electronically Signed   By: Monte Fantasia M.D.   On: 07/30/2017 17:15   Dg Chest Port 1 View  Result Date: 07/20/2017 CLINICAL DATA:  Short of breath EXAM: PORTABLE CHEST 1 VIEW COMPARISON:  06/12/2017 FINDINGS: The heart size and mediastinal contours are within normal limits. Both lungs are clear. The visualized skeletal structures are unremarkable. IMPRESSION: No active disease. Electronically Signed   By: Franchot Gallo M.D.   On: 07/20/2017 11:19  I consulted internal medicine resident physician who will arrange for admission Diagnosis #1 status asthmaticus #2 pharyngitis #3 hyperglycemia #4 hypokalemia   Orlie Dakin, MD 07/30/17 [facility]

## 2017-07-30 NOTE — ED Triage Notes (Addendum)
Pt reports sore throat for 3 days, also has allergies. PT states asthma attack starting today. Audible wheezing noted. 98% on room air. Breathing tx administered at triage. Pt was just in hospital for same thing DCd on 30th.

## 2017-07-30 NOTE — ED Provider Notes (Signed)
Emergency Department Provider Note   I have reviewed the triage vital signs and the nursing notes.   HISTORY  Chief Complaint Asthma; URI; and Sore Throat   HPI [name] is a 51 y.o. female with PMH of severe asthma requiring recent asthma admission and HTN presents to the ED for evaluation of sore throat and difficulty breathing. Symptoms have been present over the last 2 days and seemed to be worsening today. The patient was recently admitted for acute asthma exacerbation and spent time in the intensive care unit. She's been compliant with her home medications. She's had subjective fever and shaking chills in addition to sore throat. She denies any chest pain. No vomiting or diarrhea. No radiation of symptoms.    Past Medical History:  Diagnosis Date  . Asthma   . Depression   . Hypertension     Patient Active Problem List   Diagnosis Date Noted  . Asthma exacerbation 07/20/2017  . Hypokalemia 06/12/2017  . Marijuana use 06/12/2017  . Acute pain of right knee 10/14/2016  . Diabetic neuropathy ([place]) 01/07/2016  . Plantar fasciitis of left foot 11/26/2015  . Back pain 10/10/2015  . Asthma 09/29/2015  . Essential hypertension 09/29/2015  . Diabetes mellitus ([place]) 09/29/2015  . Chest pain 09/28/2015    Past Surgical History:  Procedure Laterality Date  . ABDOMINAL HYSTERECTOMY    . TUBAL LIGATION      Current Outpatient Rx  . Order #: [id number] Class: Normal  . Order #: [id number] Class: Normal  . Order #: [id number] Class: Normal  . Order #: [id number] Class: Normal  . Order #: [id number] Class: Print  . Order #: [id number] Class: Normal  . Order #: [id number] Class: Normal  . Order #: [id number] Class: Normal  . Order #: [id number] Class: No Print  . Order #: [id number] Class: Historical Med  . Order #: [id number] Class: Normal  . Order #: [id number] Class: Normal  . Order #: [id number] Class: Normal  . Order #: [id number] Class: Historical Med  . Order #: [id number] Class:  Historical Med    Allergies Patient has no known allergies.  No family history on file.  Social History Social History  Substance Use Topics  . Smoking status: Former Smoker    Types: Cigarettes    Quit date: 12/22/2004  . Smokeless tobacco: Never Used  . Alcohol use No    Review of Systems  Constitutional: Positive fever/chills Eyes: No visual changes. ENT: Positive sore throat. Cardiovascular: Denies chest pain. Respiratory: Positive shortness of breath. Gastrointestinal: No abdominal pain.  No nausea, no vomiting.  No diarrhea.  No constipation. Genitourinary: Negative for dysuria. Musculoskeletal: Negative for back pain. Skin: Negative for rash. Neurological: Negative for headaches, focal weakness or numbness.  10-point ROS otherwise negative.  ____________________________________________   PHYSICAL EXAM:  VITAL SIGNS: ED Triage Vitals  Enc Vitals Group     BP 07/30/17 1223 (!) 136/104     Pulse Rate 07/30/17 1223 76     Resp 07/30/17 1223 (!) 22     Temp 07/30/17 1223 98.6 F (37 C)     Temp Source 07/30/17 1223 Oral     SpO2 07/30/17 1223 97 %     Pain Score 07/30/17 1226 10    Constitutional: Alert and oriented. Notable increased WOB.  Eyes: Conjunctivae are normal.  Head: Atraumatic. Nose: No congestion/rhinnorhea. Mouth/Throat: Mucous membranes are moist.  Oropharynx erythematous. No tonsillar exudate. No PTA. No trismus. Managing oral secretions and speaking with in normal tone of voice.  Neck: No stridor.  No meningeal signs.  Cardiovascular: Normal rate, regular rhythm. Good peripheral circulation. Grossly normal heart sounds.   Respiratory: Increased respiratory effort.  No retractions. Lungs with end-expiratory wheezing diffusely with decreased air entry at the bases. No rales.  Gastrointestinal: Soft and nontender. No distention.  Musculoskeletal: No lower extremity tenderness nor edema. No gross deformities of extremities. Neurologic:  Normal  speech and language. No gross focal neurologic deficits are appreciated.  Skin:  Skin is warm, dry and intact. No rash noted.  ____________________________________________   LABS (all labs ordered are listed, but only abnormal results are displayed)  Labs Reviewed  BASIC METABOLIC PANEL - Abnormal; Notable for the following:       Result Value   Potassium 3.4 (*)    Glucose, Bld 249 (*)    All other components within normal limits  RAPID STREP SCREEN (NOT AT [facility])  CULTURE, GROUP A STREP [facility])  CBC WITH DIFFERENTIAL/PLATELET  I-STAT TROPONIN, ED   ____________________________________________  EKG   EKG Interpretation  Date/Time:  Thursday July 30 2017 13:27:50 EDT Ventricular Rate:  89 PR Interval:    QRS Duration: 80 QT Interval:  385 QTC Calculation: 469 R Axis:   3 Text Interpretation:  Sinus rhythm Atrial premature complex No STEMI. Similar to prior.   Confirmed by Nanda Quinton [phone]) on 07/30/2017 3:22:34 PM       ____________________________________________  RADIOLOGY  Dg Neck Soft Tissue  Result Date: 07/30/2017 CLINICAL DATA:  Sore throat with inability to swallow EXAM: NECK SOFT TISSUES - 1+ VIEW COMPARISON:  None. FINDINGS: Epiglottis appears normal. There is soft tissue thickening in the aryepiglottic region with localized narrowing of the subglottic trachea. Prevertebral soft tissues are normal. No air-fluid level to suggest abscess. Tongue base region appears normal. Bony structures appear normal. Visualized upper lung zones appear normal. IMPRESSION: Soft tissue prominence involving the aryepiglottic folds with focal narrowing of the subglottic trachea. Epiglottis appears normal. Study otherwise unremarkable. The soft tissue prominence of the aryepiglottic folds and narrowing of the subglottic trachea may warrant contrast enhanced CT of the neck to further evaluate and/or ENT assessment given the patient's clinical symptoms. Electronically Signed   By:  Lowella Grip III M.D.   On: 07/30/2017 14:04   Dg Chest 2 View  Result Date: 07/30/2017 CLINICAL DATA:  Difficulty swallowing for several days, coughing, throat pain, asthma, hypertension EXAM: CHEST  2 VIEW COMPARISON:  07/20/2017 FINDINGS: Normal heart size, mediastinal contours, and pulmonary vascularity. Lungs clear. No pleural effusion or pneumothorax. No acute osseous findings. IMPRESSION: No acute abnormalities. Electronically Signed   By: Lavonia Dana M.D.   On: 07/30/2017 13:27   Ct Soft Tissue Neck W Contrast  Result Date: 07/30/2017 CLINICAL DATA:  Sore throat/stridor. Tonsillitis/epiglottitis suspected. EXAM: CT NECK WITH CONTRAST TECHNIQUE: Multidetector CT imaging of the neck was performed using the standard protocol following the bolus administration of intravenous contrast. CONTRAST:  75 cc Isovue-300 intravenous COMPARISON:  Radiography from earlier today FINDINGS: Pharynx and larynx: No noted submucosal edema, epiglottic thickening or inflammatory enhancement. Tonsils not appear thickened. Negative for abscess. The infraglottic airway is widely patent and the trachea is not thickened. Salivary glands: No inflammation, mass, or stone. Thyroid: Enlarged right lobe with lower pole solid nodule measuring at least 23 mm. Lymph nodes: None enlarged or abnormal density. Vascular: ICA tortuosity before the skullbase. Limited intracranial: Negative Visualized orbits: Negative for mass or inflammation. Mastoids and visualized paranasal sinuses: Clear Skeleton: No acute or aggressive finding Upper chest: Negative IMPRESSION: 1. No acute finding.  No sign of tonsillitis/supraglottitis. Negative for abscess. 2. Right thyroid nodule measuring 2.3 cm. Recommend outpatient sonography. Electronically Signed   By: Monte Fantasia M.D.   On: 07/30/2017 17:15    ____________________________________________   PROCEDURES  Procedure(s) performed:    Procedures  None ____________________________________________   INITIAL IMPRESSION / ASSESSMENT AND PLAN / ED COURSE  Pertinent labs & imaging results that were available during my care of the patient were reviewed by me and considered in my medical decision making (see chart for details).  Patient presents to the emergency department for evaluation of sore throat, subjective fever, difficulty breathing. On exam she has no trismus or peritonsillar abscess. She does have diffuse erythema in the posterior pharynx. Lungs sounds are diminished at the bases with some faint wheezing. Suspect that asthma is contributing to his symptoms. Plan for plain films of the neck and additional nebs with steroid.   02:52 PM Patient is satting well and sitting back in bed breathing comfortably. No tachypnea, stridor, or trismus. Plain film shows some soft tissue swelling in and focal swelling of the subglottic trachea. Doubt angioedema with painful throat and subjective fever x 2 days. No indication at this time for emergent airway mgmt. Plan for CT neck to assess further. Updated patient who reports overall improved symptoms.   CT neck pending. Patient care transferred to oncoming ED physician who will follow CT results and reassess.  ____________________________________________  FINAL CLINICAL IMPRESSION(S) / ED DIAGNOSES  Final diagnoses:  Moderate persistent asthma with status asthmaticus     MEDICATIONS GIVEN DURING THIS VISIT:  Medications  albuterol (PROVENTIL) (2.5 MG/3ML) 0.083% nebulizer solution (  Not Given 07/30/17 1230)  potassium chloride SA (K-DUR,KLOR-CON) CR tablet 40 mEq (not administered)  albuterol (PROVENTIL) (2.5 MG/3ML) 0.083% nebulizer solution 5 mg (5 mg Nebulization Given 07/30/17 1230)  albuterol (PROVENTIL) (2.5 MG/3ML) 0.083% nebulizer solution 2.5 mg (2.5 mg Nebulization Given 07/30/17 1336)  methylPREDNISolone sodium succinate (SOLU-MEDROL) 125 mg/2 mL injection 125 mg  (125 mg Intravenous Given 07/30/17 1431)  iopamidol (ISOVUE-300) 61 % injection (75 mLs  Contrast Given 07/30/17 1505)  morphine 4 MG/ML injection 4 mg (4 mg Intravenous Given 07/30/17 1810)  albuterol (PROVENTIL) (2.5 MG/3ML) 0.083% nebulizer solution 5 mg (5 mg Nebulization Given 07/30/17 1811)     NEW OUTPATIENT MEDICATIONS STARTED DURING THIS VISIT:  None  Note:  This document was prepared using Dragon voice recognition software and may include unintentional dictation errors.  Nanda Quinton, MD Emergency Medicine    Dontravious Camille, [facility], MD 07/30/17 [phone]

## 2017-07-30 NOTE — ED Notes (Signed)
ED Provider at bedside. 

## 2017-07-30 NOTE — H&P (Signed)
Date: 07/30/2017               Patient Name:  [name] MRN: [id number]  DOB: September 08, 1966 Age / Sex: 51 y.o., female   PCP: Synthia Innocent Audrea Muscat, NP         Medical Service: Internal Medicine Teaching Service         Attending Physician: Dr. Annia Belt, MD    First Contact: Dr. Tawny Asal Pager: [phone]  Second Contact: Dr. Jule Ser Pager: [phone]       After Hours (After 5p/  First Contact Pager: [phone]  weekends / holidays): Second Contact Pager: [phone]   Chief Complaint: "can't breathe", sore throat,  History of Present Illness: pt is a 51 yo female with pmh of T2DM (poorly controlled),  Asthma (poorly controlled), HTN presents with shortness of breath and cough with post tussive emesis X3 beginning this morning.   For the last 3 days the patient has had an increasingly sore throat and a non productive cough .  She denies fever, runny nose, diarrhea, or sick contacts but does report feeling chills and dysuria.  She reports taking her albuterol nebulizer treatments once in the morning and once at night and her QVAR twice a day as prescribed and recently albuterol rescue inhaler at least 2 times per day.  She admits not taking her medications this morning however.  She feels that her asthma has been poorly controlled since May of this year when she moved into her daughter's apartment.  Her daughter is a smoker but reports she does not smoke inside the house, the pt says there is a lot of dust in the house but no animals and denies smoking herself.  Pt reports waking up at night at least 3 times per week due to her asthma and reports her last admission around one week ago began with an exacerbation that began during the night.  She uses her rescue inhaler at least twice per day, and she feels her triggers are weather changes (feels this precipitated this event) and dust.  She was admitted on 07/20/17 for an asthma exacerbation that began with cough during the night,  she was hypoxic when EMS arrived with O2 saturation at 70%. At the time she admitted to smoking marijuana. Additionally, she was found to be tachycardic on arrival in [facility] and was treated with adenosine and was temporarily put on bipap.  She was given breathing treatments daily,  IV solumedrol, and doxycycline,  her breathing improved and she was transitioned to oral prednisone X 4 days and 3 more days of doxycycline, her qvar dose was increased to 30mcg then discharged home.       ED course: Pt was given 3 duo neb treatments, IV solumedrol, chest x ray negative, neck x ray done due to concern for throat pain and painful swallowing showed soft tissue prominence of the aryepiglottic folds warranting CT neck with contrast, CT neck with contrast showed small thyroid nodule but otherwise benign.  CBC was normal, potassium was 3.4 KDUR was given, Glucose was 249.  Morphine 4mg  IV was administered for pain.  Urinalysis was ordered due to pt complaining of dysuria but has not resulted yet. Rapid strep neg, strep culture ordered, ECG showed sinus rhythm with APC     Meds:  Current Meds  Medication Sig  . albuterol (PROVENTIL HFA;VENTOLIN HFA) 108 (90 Base) MCG/ACT inhaler Inhale 1-2 puffs into the lungs every 6 (six) hours as needed for  wheezing or shortness of breath.  Marland Kitchen albuterol (PROVENTIL) (2.5 MG/3ML) 0.083% nebulizer solution Take 3 mLs (2.5 mg total) by nebulization every 2 (two) hours as needed for wheezing or shortness of breath.  . beclomethasone (QVAR) 80 MCG/ACT inhaler Inhale 1 puff into the lungs 2 (two) times daily.  . chlorpheniramine-HYDROcodone (TUSSIONEX) 10-8 MG/5ML SUER Take 5 mLs by mouth every 12 (twelve) hours. (Patient taking differently: Take 5 mLs by mouth every 12 (twelve) hours as needed for cough. )  . cyclobenzaprine (FLEXERIL) 10 MG tablet Take 1 tablet (10 mg total) by mouth 2 (two) times daily as needed for muscle spasms.  . diclofenac sodium (VOLTAREN) 1 % GEL Apply 2 g  topically 4 (four) times daily. (Patient taking differently: Apply 2 g topically 4 (four) times daily as needed (pain). )  . fluticasone (FLONASE) 50 MCG/ACT nasal spray Place 2 sprays into both nostrils daily.  Marland Kitchen gabapentin (NEURONTIN) 300 MG capsule Take 1 capsule (300 mg total) by mouth 2 (two) times daily.  Marland Kitchen glipiZIDE (GLUCOTROL) 5 MG tablet Take 1 tablet (5 mg total) by mouth every morning.  Marland Kitchen guaiFENesin (MUCINEX) 600 MG 12 hr tablet Take 600 mg by mouth 2 (two) times daily as needed for cough.   Marland Kitchen lisinopril (PRINIVIL,ZESTRIL) 5 MG tablet Take 1 tablet (5 mg total) by mouth daily.  Marland Kitchen loratadine (CLARITIN) 10 MG tablet Take 1 tablet (10 mg total) by mouth daily.  Marland Kitchen omeprazole (PRILOSEC) 20 MG capsule Take 2 capsules (40 mg total) by mouth daily.  . sertraline (ZOLOFT) 50 MG tablet Take 75 mg by mouth every morning.      Allergies: Allergies as of 07/30/2017  . (No Known Allergies)   Past Medical History:  Diagnosis Date  . Asthma   . Depression   . Hypertension     Family History: No family history on file.  Social History:  Social History   Social History  . Marital status: Married    Spouse name: N/A  . Number of children: N/A  . Years of education: N/A   Occupational History  . Not on file.   Social History Main Topics  . Smoking status: Former Smoker    Types: Cigarettes    Quit date: 12/22/2004  . Smokeless tobacco: Never Used  . Alcohol use No  . Drug use: No  . Sexual activity: Not Currently   Other Topics Concern  . Not on file   Social History Narrative   [facility] (07/20/17):   No bird or mold exposure. No recent travel.     Review of Systems: A complete ROS was negative except as per HPI.   Physical Exam: Blood pressure (!) 146/105, pulse 97, temperature 98.3 F (36.8 C), temperature source Oral, resp. rate 18, SpO2 99 %. Physical Exam  Constitutional: She is oriented to person, place, and time. She appears well-developed and  well-nourished.  HENT:  Head: Normocephalic and atraumatic.  Mouth/Throat: Abnormal dentition. No uvula swelling. Posterior oropharyngeal erythema present. No oropharyngeal exudate or tonsillar abscesses.    Appears to be some mild oral candidiasis   Eyes: Right eye exhibits no discharge. Left eye exhibits no discharge. No scleral icterus.  Neck: No thyromegaly present.  Cardiovascular: Normal rate, regular rhythm and normal heart sounds.  Exam reveals no friction rub.   No murmur heard. Pulmonary/Chest: No tachypnea and no bradypnea. No respiratory distress. She has decreased breath sounds (pt not taking deep breaths due to pain ). She has wheezes (upper airway  only).  Abdominal: Soft. Bowel sounds are normal. She exhibits no distension.  Musculoskeletal: She exhibits no edema or tenderness.  Lymphadenopathy:    She has no cervical adenopathy.  Neurological: She is alert and oriented to person, place, and time.  Skin: Skin is warm and dry.    EKG: personally reviewed my interpretation is sinus rhythm with apcs  CXR: personally reviewed my interpretation is no acute pulmonary process, no bone disease  Assessment & Plan by Problem: Principal Problem:   Asthma exacerbation Active Problems:   Asthma   Essential hypertension   Diabetes mellitus ([place])   Diabetic neuropathy ([employer])   Hypokalemia  51 yo female with pmh of T2DM (poorly controlled),  Asthma (poorly controlled), multiple admissions over the last 3 months, HTN presents with  shortness of breath, wheezing and cough with post tussive emesis X3 beginning this morning.   Asthma exacerbation/Cough: possibly 2/2 viral laryngitis, pharynx also erythematous, pt did not take meds this morning and was outside in the wind per daughter.    -anti-tussives as cough more likely related to irritation of throat and not lungs.   -Scheduled albuterol breathing treatments X2, with added every 4 hours PRN -Pulmicort BID -GI  cocktail -Flonase -Cepacol lozenges -loratidine -solumedrol 60mg  q12hr -NS 172ml/hr due to post tussive vomiting, sore throat   HTN: pt 146/100 not having taken her home lisinopril this morning  -switching to losartan 50mg  due to pts cough  T2DM: poorly controlled on glipizide 5mg  at home  -correctional insulin -ask pt to follow up with PCP about adding additional agents to home regimen  Hypokalemia: very mild at 3.4 likely due to B agonist administration, pt will begin receiving insulin as well so will continue to monitor BMP and replete as necessary  -one dose of KDUR 80meq  Dispo: Admit patient to Inpatient with expected length of stay greater than 2 midnights.  Signed: Katherine Roan, MD 07/30/2017, 9:54 PM

## 2017-07-31 ENCOUNTER — Encounter (HOSPITAL_COMMUNITY): Payer: Self-pay | Admitting: *Deleted

## 2017-07-31 DIAGNOSIS — E1169 Type 2 diabetes mellitus with other specified complication: Secondary | ICD-10-CM

## 2017-07-31 DIAGNOSIS — I10 Essential (primary) hypertension: Secondary | ICD-10-CM

## 2017-07-31 DIAGNOSIS — Z79899 Other long term (current) drug therapy: Secondary | ICD-10-CM

## 2017-07-31 DIAGNOSIS — Z7984 Long term (current) use of oral hypoglycemic drugs: Secondary | ICD-10-CM

## 2017-07-31 DIAGNOSIS — Z7722 Contact with and (suspected) exposure to environmental tobacco smoke (acute) (chronic): Secondary | ICD-10-CM

## 2017-07-31 DIAGNOSIS — J45901 Unspecified asthma with (acute) exacerbation: Secondary | ICD-10-CM

## 2017-07-31 DIAGNOSIS — E876 Hypokalemia: Secondary | ICD-10-CM

## 2017-07-31 DIAGNOSIS — J029 Acute pharyngitis, unspecified: Secondary | ICD-10-CM

## 2017-07-31 DIAGNOSIS — K0889 Other specified disorders of teeth and supporting structures: Secondary | ICD-10-CM

## 2017-07-31 DIAGNOSIS — Z77110 Contact with and (suspected) exposure to air pollution: Secondary | ICD-10-CM

## 2017-07-31 DIAGNOSIS — Z87891 Personal history of nicotine dependence: Secondary | ICD-10-CM

## 2017-07-31 LAB — URINALYSIS, ROUTINE W REFLEX MICROSCOPIC
BACTERIA UA: NONE SEEN
BILIRUBIN URINE: NEGATIVE
Glucose, UA: >=500 mg/dL — AB
HGB URINE DIPSTICK: NEGATIVE
Ketones, ur: NEGATIVE mg/dL
LEUKOCYTES UA: NEGATIVE
NITRITE: NEGATIVE
PROTEIN: NEGATIVE mg/dL
Specific Gravity, Urine: 1.036 — ABNORMAL HIGH (ref 1.005–1.030)
pH: 5 (ref 5.0–8.0)

## 2017-07-31 LAB — CBC
HCT: 39.5 % (ref 36.0–46.0)
Hemoglobin: 13 g/dL (ref 12.0–15.0)
MCH: 29.4 pg (ref 26.0–34.0)
MCHC: 32.9 g/dL (ref 30.0–36.0)
MCV: 89.4 fL (ref 78.0–100.0)
PLATELETS: 244 10*3/uL (ref 150–400)
RBC: 4.42 MIL/uL (ref 3.87–5.11)
RDW: 14.6 % (ref 11.5–15.5)
WBC: 12.7 10*3/uL — AB (ref 4.0–10.5)

## 2017-07-31 LAB — BASIC METABOLIC PANEL
Anion gap: 8 (ref 5–15)
BUN: 14 mg/dL (ref 6–20)
CALCIUM: 9.4 mg/dL (ref 8.9–10.3)
CO2: 25 mmol/L (ref 22–32)
CREATININE: 0.98 mg/dL (ref 0.44–1.00)
Chloride: 104 mmol/L (ref 101–111)
Glucose, Bld: 410 mg/dL — ABNORMAL HIGH (ref 65–99)
Potassium: 4.6 mmol/L (ref 3.5–5.1)
SODIUM: 137 mmol/L (ref 135–145)

## 2017-07-31 LAB — GLUCOSE, CAPILLARY
GLUCOSE-CAPILLARY: 373 mg/dL — AB (ref 65–99)
GLUCOSE-CAPILLARY: 137 mg/dL — AB (ref 65–99)
GLUCOSE-CAPILLARY: 177 mg/dL — AB (ref 65–99)
Glucose-Capillary: 314 mg/dL — ABNORMAL HIGH (ref 65–99)

## 2017-07-31 MED ORDER — MAGIC MOUTHWASH W/LIDOCAINE: 2.0000 mL | Freq: Three times a day (TID) | ORAL | Status: DC

## 2017-07-31 MED ORDER — INSULIN ASPART 100 UNIT/ML ~~LOC~~ SOLN
6.0000 [IU] | Freq: Three times a day (TID) | SUBCUTANEOUS | Status: DC
  Administered 2017-07-31 (×3): 6 [IU] via SUBCUTANEOUS

## 2017-07-31 MED ORDER — PREDNISONE 20 MG PO TABS
40.0000 mg | ORAL_TABLET | Freq: Every day | ORAL | Status: DC
  Administered 2017-08-01: 40 mg via ORAL
  Filled 2017-07-31: qty 2

## 2017-07-31 MED ORDER — HYDROCOD POLST-CPM POLST ER 10-8 MG/5ML PO SUER
5.0000 mL | Freq: Two times a day (BID) | ORAL | Status: DC | PRN
  Administered 2017-07-31: 5 mL via ORAL
  Filled 2017-07-31: qty 5

## 2017-07-31 MED ORDER — MAGIC MOUTHWASH W/LIDOCAINE
5.0000 mL | Freq: Three times a day (TID) | ORAL | Status: DC
  Administered 2017-07-31 – 2017-08-02 (×7): 5 mL via ORAL
  Filled 2017-07-31 (×7): qty 5

## 2017-07-31 NOTE — Progress Notes (Signed)
Inpatient Diabetes Program Recommendations  AACE/ADA: New Consensus Statement on Inpatient Glycemic Control (2015)  Target Ranges:  Prepandial:   less than 140 mg/dL      Peak postprandial:   less than 180 mg/dL (1-2 hours)      Critically ill patients:  140 - 180 mg/dL   Results for KEERSTIN, BJELLAND (MRN [id number]) as of 07/31/2017 09:38  Ref. Range 07/30/2017 20:33 07/31/2017 07:30  Glucose-Capillary Latest Ref Range: 65 - 99 mg/dL 305 (H) 373 (H)  Results for JAQLYN, GRUENHAGEN (MRN [id number]) as of 07/31/2017 09:38  Ref. Range 07/20/2017 10:52  Hemoglobin A1C Latest Ref Range: 4.8 - 5.6 % 9.0 (H)   Review of Glycemic Control  Diabetes history: DM2 Outpatient Diabetes medications: Glipizide 5 mg QAM Current orders for Inpatient glycemic control: Novolog 0-15 units TID with meals, Novolog 0-5 units QHS, Novolog 6 units TID with meals  Inpatient Diabetes Program Recommendations: Insulin - Basal: Fasting glucose 373 mg/dl today. If steroids are continued, please consider ordering Levemir 13 units Q24H (based on 84 kg x 0.15 units). Insulin - Meal Coverage: Noted Novolog 6 units TID meal coverage was started today which should help with post prandial elevated.   NOTE: In reviewing chart, noted patient was recently in hospital and Milon Dikes, RN, CDE, Inpatient Diabetes Coordinator spoke with patient regarding A1C. Per her note, patient was asked to follow up with PCP regarding DM control. Patient likely needs additional DM medications for DM control. Note patient is ordered Solumedrol 60 mg Q12H which is contributing to hyperglycemia at this time.  Thanks, Barnie Alderman, RN, MSN, CDE Diabetes Coordinator Inpatient Diabetes Program [phone] (Team Pager from 8am to 5pm)

## 2017-07-31 NOTE — Progress Notes (Signed)
   Subjective: She was admitted overnight, states she woke up and required a breathing treatment overnight. She reports improvement with nebulizers and had just finished one prior to rounds.    Objective:  Vital signs in last 24 hours: Vitals:   07/30/17 2136 07/31/17 0358 07/31/17 0427 07/31/17 1012  BP:   127/84   Pulse: 97 90 90 90  Resp: 18 18 18 18   Temp:   97.8 F (36.6 C)   TempSrc:   Oral   SpO2: 99% 95% 93% 94%  Weight:      Height:       Physical Exam  Constitutional: She is oriented to person, place, and time. She appears well-developed and well-nourished.  HENT:  Head: Normocephalic and atraumatic.  Erythema of pharynx, no exudates or thrush   Eyes: Pupils are equal, round, and reactive to light.  Cardiovascular: Normal rate and regular rhythm.   Pulmonary/Chest: Effort normal and breath sounds normal. No respiratory distress. She has no wheezes.  Abdominal: Soft. There is no tenderness.  Musculoskeletal: She exhibits no edema.  Lymphadenopathy:    She has no cervical adenopathy.  Neurological: She is alert and oriented to person, place, and time.  Skin: Skin is warm and dry.     Assessment/Plan:  Asthma Exacerbation secondary to viral pharyngitis  Pt recently discharged from an admission for asthma exacerbation, returned with wheezing, SOB in the setting of sore throat and cough. Likely a viral URI leading to recurrent asthma exacerbation. Initially there was concern for oral thrush, only pharyngeal erythema on subsequent exam. She has not been hypoxic and has had improvement in her breathing with scheduled nebulizers and IV steroids, also providing symptomatic relief for sore throat.   --Scheduled albuterol nebulizer BID and q4hrs prn --Budesonide nebulizer BID --IV Solumedrol 60 mg q12hrs, transition to PO Prednisone tomorrow  --Symptomatic tx with guaifenesin, tussionex, cepacol lozenge  --Magic mouthwash with lidocaine   Diabetes Mellitus, Type 2, poorly  controlled  A1c of 9.0% during last admission, currently on Glipizide 5 mg daily at home. Pt has not been able to follow up with her PCP as advised on prior discharge. She will need increased treatment for better control.  --SSI, consider long acting insulin if better control needed as inpatient   Hypertension  Pt is on home Lisinopril for blood pressure control. Switched to Losartan in case ACE inhibitor was contributing to patient cough. Her BP has been stable since admission.  --Continue Losartan 50 mg   Dispo: Anticipated discharge in approximately 1-2 day(s).   Tawny Asal, MD 07/31/2017, 2:28 PM Pager: [phone]

## 2017-08-01 DIAGNOSIS — E1165 Type 2 diabetes mellitus with hyperglycemia: Secondary | ICD-10-CM

## 2017-08-01 LAB — BASIC METABOLIC PANEL
Anion gap: 10 (ref 5–15)
BUN: 19 mg/dL (ref 6–20)
CHLORIDE: 102 mmol/L (ref 101–111)
CO2: 22 mmol/L (ref 22–32)
CREATININE: 1.02 mg/dL — AB (ref 0.44–1.00)
Calcium: 9.3 mg/dL (ref 8.9–10.3)
Glucose, Bld: 588 mg/dL (ref 65–99)
POTASSIUM: 4.9 mmol/L (ref 3.5–5.1)
SODIUM: 134 mmol/L — AB (ref 135–145)

## 2017-08-01 LAB — GLUCOSE, CAPILLARY
GLUCOSE-CAPILLARY: 415 mg/dL — AB (ref 65–99)
GLUCOSE-CAPILLARY: 231 mg/dL — AB (ref 65–99)
GLUCOSE-CAPILLARY: 392 mg/dL — AB (ref 65–99)
Glucose-Capillary: 257 mg/dL — ABNORMAL HIGH (ref 65–99)

## 2017-08-01 LAB — CULTURE, GROUP A STREP (THRC)

## 2017-08-01 MED ORDER — BENZONATATE 100 MG PO CAPS
100.0000 mg | ORAL_CAPSULE | Freq: Three times a day (TID) | ORAL | Status: DC
  Administered 2017-08-01 – 2017-08-02 (×3): 100 mg via ORAL
  Filled 2017-08-01 (×3): qty 1

## 2017-08-01 MED ORDER — POLYETHYLENE GLYCOL 3350 17 G PO PACK
17.0000 g | PACK | Freq: Every day | ORAL | Status: DC
  Administered 2017-08-01 – 2017-08-02 (×2): 17 g via ORAL
  Filled 2017-08-01 (×2): qty 1

## 2017-08-01 MED ORDER — INSULIN ASPART 100 UNIT/ML ~~LOC~~ SOLN: 5.0000 [IU] | Freq: Every day | SUBCUTANEOUS | Status: DC

## 2017-08-01 MED ORDER — INSULIN ASPART 100 UNIT/ML ~~LOC~~ SOLN
3.0000 [IU] | Freq: Once | SUBCUTANEOUS | Status: AC
  Administered 2017-08-01: 3 [IU] via SUBCUTANEOUS

## 2017-08-01 MED ORDER — DOCUSATE SODIUM 100 MG PO CAPS
100.0000 mg | ORAL_CAPSULE | Freq: Once | ORAL | Status: AC
  Administered 2017-08-01: 100 mg via ORAL
  Filled 2017-08-01: qty 1

## 2017-08-01 MED ORDER — GUAIFENESIN-CODEINE 100-10 MG/5ML PO SOLN
5.0000 mL | Freq: Four times a day (QID) | ORAL | Status: DC
  Administered 2017-08-01 – 2017-08-02 (×5): 5 mL via ORAL
  Filled 2017-08-01 (×5): qty 5

## 2017-08-01 MED ORDER — PHENOL 1.4 % MT LIQD: 2.0000 | Freq: Three times a day (TID) | OROMUCOSAL | Status: DC

## 2017-08-01 MED ORDER — MENTHOL 3 MG MT LOZG
1.0000 | LOZENGE | Freq: Three times a day (TID) | OROMUCOSAL | Status: DC
  Administered 2017-08-01: 1 via ORAL
  Filled 2017-08-01 (×3): qty 9

## 2017-08-01 MED ORDER — PHENOL 1.4 % MT LIQD
2.0000 | Freq: Three times a day (TID) | OROMUCOSAL | Status: DC
  Administered 2017-08-01 – 2017-08-02 (×2): 2 via OROMUCOSAL
  Filled 2017-08-01 (×2): qty 177

## 2017-08-01 MED ORDER — MENTHOL 3 MG MT LOZG: 1.0000 | LOZENGE | Freq: Three times a day (TID) | OROMUCOSAL | Status: DC

## 2017-08-01 NOTE — Discharge Summary (Signed)
Name: [name] MRN: [id number] DOB: 1966/05/16 51 y.o. PCP: Crystal Coots, NP  Date of Admission: 07/30/2017 12:49 PM Date of Discharge: 08/02/2017 Attending Physician: Crystal Belt, MD  Discharge Diagnosis: Principal Problem:   Asthma exacerbation Active Problems:   Asthma   Essential hypertension   Diabetes mellitus ([place])   Diabetic neuropathy [facility])   Discharge Medications: Allergies as of 08/02/2017   No Known Allergies     Medication List    STOP taking these medications   fluticasone 50 MCG/ACT nasal spray Commonly known as:  FLONASE   lisinopril 5 MG tablet Commonly known as:  PRINIVIL,ZESTRIL     TAKE these medications   albuterol 108 (90 Base) MCG/ACT inhaler Commonly known as:  PROVENTIL HFA;VENTOLIN HFA Inhale 1-2 puffs into the lungs every 6 (six) hours as needed for wheezing or shortness of breath.   albuterol (2.5 MG/3ML) 0.083% nebulizer solution Commonly known as:  PROVENTIL Take 3 mLs (2.5 mg total) by nebulization every 2 (two) hours as needed for wheezing or shortness of breath.   beclomethasone 80 MCG/ACT inhaler Commonly known as:  QVAR Inhale 2 puffs into the lungs 2 (two) times daily. What changed:  how much to take   chlorpheniramine-HYDROcodone 10-8 MG/5ML Suer Commonly known as:  TUSSIONEX Take 5 mLs by mouth every 12 (twelve) hours. What changed:  when to take this  reasons to take this   cyclobenzaprine 10 MG tablet Commonly known as:  FLEXERIL Take 1 tablet (10 mg total) by mouth 2 (two) times daily as needed for muscle spasms.   diclofenac sodium 1 % Gel Commonly known as:  VOLTAREN Apply 2 g topically 4 (four) times daily. What changed:  when to take this  reasons to take this   gabapentin 300 MG capsule Commonly known as:  NEURONTIN Take 1 capsule (300 mg total) by mouth 2 (two) times daily.   glipiZIDE 5 MG tablet Commonly known as:  GLUCOTROL Take 1 tablet (5 mg total) by mouth every  morning.   guaiFENesin 600 MG 12 hr tablet Commonly known as:  MUCINEX Take 600 mg by mouth 2 (two) times daily as needed for cough.   guaiFENesin-codeine 100-10 MG/5ML syrup Take 5 mLs by mouth every 6 (six) hours as needed for cough.   loratadine 10 MG tablet Commonly known as:  CLARITIN Take 1 tablet (10 mg total) by mouth daily.   losartan 50 MG tablet Commonly known as:  COZAAR Take 1 tablet (50 mg total) by mouth daily.   metFORMIN 1000 MG tablet Commonly known as:  GLUCOPHAGE Take 1 tablet (1,000 mg total) by mouth 2 (two) times daily with a meal. See discharge instructions for how to take medicine at first   omeprazole 20 MG capsule Commonly known as:  PRILOSEC Take 2 capsules (40 mg total) by mouth daily.   phenol 1.4 % Liqd Commonly known as:  CHLORASEPTIC Use as directed 2 sprays in the mouth or throat 3 (three) times daily.   polyethylene glycol packet Commonly known as:  MIRALAX / GLYCOLAX Take 17 g by mouth daily as needed for mild constipation.   sertraline 50 MG tablet Commonly known as:  ZOLOFT Take 75 mg by mouth every morning.       Disposition and follow-up:   [name] was discharged from [facility] in Stable condition.  At the hospital follow up visit please address:  1.  -Assess asthma control-Pt has Pulm follow up scheduled 8/14 -  Address diabetes control at PCP follow up and tolerance to metformin   -Ensure resolution of viral URI symptoms  -Continue to encourage marijuana cessation   2.  Labs / imaging needed at time of follow-up: None   3.  Pending labs/ test needing follow-up: None  Follow-up Appointments: Follow-up Information    [name], Crystal Muscat, NP Follow up.   Why:  Make an appointment with your PCP for diabetes  Contact information: [place] [id number] [phone]           Hospital Course by problem list:  Asthma exacerbation secondary to viral pharyngitis Pt presented  with increased shortness of breath, wheezing, and rescue inhaler use in the setting of recent throat pain and cough. She was not hypoxic on presentation or throughout her admission and did not require supplemental oxygen in the ED or on the floor. She received IV steroids, scheduled albuterol nebs with additional treatments prn, and inhaled corticosteroid inhaler was continued. Her cough and sore throat were treated supportively and her symptoms improved. It was felt this exacerbation was a result of the viral upper respiratory infection. She was discharged with symptomatic treatment for her URI which should continue to improve her throat pain, cough, and resulting shortness of breath. Equivalent home asthma medications were continued during admission- she is currently on 160 mcg of QVAR daily which would be a low dose ICS. On discharge, we increased her dosing to 2 puffs BID for total dose of 320 mcg daily for medium dose ICS. This can be re-addressed at pulmonology follow up. Oral corticosteroids were not continued on discharge. Her living situation with her daughter with increased presence of potential triggers is likely making her asthma control more difficult, pt does not currently have another place to stay but is working toward moving into a different apartment.    Diabetes Mellitus, Type 2, poorly controlled A1c of 9.0% during last admission, currently on Glipizide 5 mg daily at home. She was instructed to follow up with her PCP but was unable to do so before this admission. Her glucose was managed with correctional SSI with elevated values due to steroid use. Her glucose should be more stable on discharge with cessation of corticosteroids. On discharge she was prescribed with Metformin and instructions to up-titrate to 1000 mg BID to avoid GI side effects. Again instructed to make appointment with PCP for further management.   Marijuana Use Reports successful cessation of smoking marijuana since last  admission and is motivated to maintain cessation. This should hopefully help improve asthma control in the long term.   Hypertension Pt presented on a home regimen of Lisinopril, however this was switched to Losartan on presentation for concern of cough related to ACE inhibitor worsening her current presentation or her asthma in the future. BP was stable during admission and Losartan was continued on discharge.       Discharge Vitals:   BP (!) 144/92 (BP Location: Right Arm)   Pulse 63   Temp 98.1 F (36.7 C) (Oral)   Resp 18   Ht 5\' 1"  (1.549 m)   Wt 187 lb 9.8 oz (85.1 kg)   SpO2 100%   BMI 35.45 kg/m   Pertinent Labs, Studies, and Procedures:  CBC    Component Value Date/Time   WBC 12.7 (H) 07/31/2017 0411   RBC 4.42 07/31/2017 0411   HGB 13.0 07/31/2017 0411   HCT 39.5 07/31/2017 0411   PLT 244 07/31/2017 0411   MCV 89.4  07/31/2017 0411   MCH 29.4 07/31/2017 0411   MCHC 32.9 07/31/2017 0411   RDW 14.6 07/31/2017 0411   LYMPHSABS 1.8 07/30/2017 1345   MONOABS 0.4 07/30/2017 1345   EOSABS 0.0 07/30/2017 1345   BASOSABS 0.0 07/30/2017 1345     Discharge Instructions: Discharge Instructions    Discharge instructions    Complete by:  As directed    -Follow up with the Pulmonology doctors on Aug 14th at 11 am at [facility]. Until then use 2 puffs of QVAR, two times daily (instead of 1 puff two times a day before)   -We prescribed some cough medicine and throat spray to continue to use for your sore throat and cough  -Make an appointment with your primary care doctor to talk about how to improve your diabetes. Until then, we prescribed a new medicine called metformin to take for diabetes. See below for how to take this medicine at first  -Take a half tablet at breakfast and a half tablet at dinner for 5 days.  -Then take a half tablet at breakfast and a full tablet at dinner for 5 days.  -Then take a full tablet at breakfast and a full tablet at dinner from then  on      Signed: Tawny Asal, MD 08/02/2017, 12:55 PM   Pager: [phone]

## 2017-08-01 NOTE — Progress Notes (Signed)
Patient with sudden respiratory distress when she was preparing to take a shower.  She states she has been oob in room this am without problems.  She is sitting on the side of the bed.  Increased work of breathing.  occ expiratory wheeze, faint crackles in bases.  Good air movement.  Albuterol treatment given.  Post treatment, patient felt much better.  Still with dry cough, but breathing easily.  Resident at bedside to assess patient.

## 2017-08-01 NOTE — Progress Notes (Signed)
Evaluated pt after complaints of increased WOB, respiratory distress. Pt had received a breathing treatment with RT with no oxygen desaturations during the episode. During our exam, bilateral breath sounds clear without wheezing after treatment. Pt reports predominately feeling tightness in her throat, has not had prolonged relief of throat pain or coughing with current regimen. Will schedule robitussin every 6 hours, benzonatate tid, and phenol mouth spray to attempt improved symptom control which may precipitate her increased work of breathing. Will continue to monitor oxygenation and respiratory sx

## 2017-08-01 NOTE — Progress Notes (Signed)
Clinical status and database reviewed with resident physician Dr. Tawny Asal and I concur with his evaluation and management plan. She continues to have paroxysmal episodes of dyspnea and wheezing.  Mostly upper airway.  Rapid response team was called when she developed acute respiratory distress when preparing to take a shower.  When they arrived she was not in any extreme distress.  She was moving air well.  She improved after an albuterol treatment. She is currently breathing at 18 breaths per minute.  Oxygen saturation 98% on 2 L oxygen. We have made some minor changes in the scheduling of her medications.  Blood sugars unacceptably high on steroids.  We will discontinue the steroids since they are not providing any clear benefit. Her condition has been rather mercurial and continued in hospital observation warranted.

## 2017-08-01 NOTE — Progress Notes (Signed)
Paged Internal medicine,  Conflicting scheduled insuline orders. Advised CBG still 415.  T/O Ok to use the 6 units scheduled insulin along with SS.  Stated they would dc the 5 unit scheduled order.

## 2017-08-01 NOTE — Progress Notes (Signed)
RT called by RN due to patient respiratory distress after patient had been up to go to the bathroom. When RT arrived to room RN had started patient on nebulizer treatment. RT assessed patient. Patient has slight expiratory wheeze and some wet crackles at bases, could be indicative of possible fluid. Post treatment patient's RR and work of breathing was better and patient was comfortable. Vital signs are stable. RT will continue to monitor.

## 2017-08-01 NOTE — Progress Notes (Signed)
Pt in respiratory distress, paged rapid response, 02 sat 99% RA, pt wheezing, short of breath, gave breathing tx, paged Internal Medicine, advised pt condition.  Stated they would be up to see her.  Pt better after treatment.O2 sat 100 % RA heart rate 105.  Pt much better, no in distress at this time.

## 2017-08-01 NOTE — Progress Notes (Signed)
   Subjective: Reports waking up overnight with shortness of breath which was relieved by a prn breathing treatment. No hypoxia overnight, has been stable on room air.  Also has had continued sore throat and coughing relieved with throat spray and coughing medications. She also feels constipated, no BM overnight with miralax, will add colace this morning.   Pt had elevated blood glucose this morning due to steroid use requiring extra correctional insulin.   Objective:  Vital signs in last 24 hours: Vitals:   07/31/17 1916 07/31/17 2027 08/01/17 0150 08/01/17 0508  BP:  137/86  132/89  Pulse:  95  87  Resp:  18  18  Temp:  98.2 F (36.8 C)  98.2 F (36.8 C)  TempSrc:  Oral  Oral  SpO2: 97% 93% 99% 91%  Weight:  185 lb 3 oz (84 kg)    Height:       Physical Exam  Constitutional: She is oriented to person, place, and time. She appears well-developed and well-nourished.  HENT:  Head: Normocephalic and atraumatic.  Erythema of pharynx, no exudates or thrush   Eyes: Pupils are equal, round, and reactive to light.  Cardiovascular: Normal rate and regular rhythm.   Pulmonary/Chest: Effort normal and breath sounds normal. No respiratory distress. She has no wheezes.  Abdominal: Soft. There is no tenderness.  Musculoskeletal: She exhibits no edema.  Lymphadenopathy:    She has no cervical adenopathy.  Neurological: She is alert and oriented to person, place, and time.  Skin: Skin is warm and dry.     Assessment/Plan:  Asthma Exacerbation secondary to viral pharyngitis  Pt recently discharged from an admission for asthma exacerbation, returned with wheezing, SOB in the setting of sore throat and cough. Likely a viral URI leading to recurrent asthma exacerbation. Initially there was concern for oral thrush, only pharyngeal erythema on subsequent exam. She has not been hypoxic and has had improvement in her breathing with scheduled nebulizers and IV steroids, also providing symptomatic  relief for sore throat.   --Scheduled albuterol nebulizer BID and q4hrs prn --Budesonide nebulizer BID --PO Prednisone 40 mg  --Symptomatic tx with guaifenesin, tussionex, cepacol lozenge  --Magic mouthwash with lidocaine   Diabetes Mellitus, Type 2, poorly controlled  A1c of 9.0% during last admission, currently on Glipizide 5 mg daily at home. Pt has not been able to follow up with her PCP as advised on prior discharge. She will need increased treatment for better control.  --SSI, consider long acting insulin   Hypertension  Pt is on home Lisinopril for blood pressure control. Switched to Losartan in case ACE inhibitor was contributing to patient cough. Her BP has been stable since admission.  --Continue Losartan 50 mg   Dispo: Anticipated discharge in approximately 0-1 day(s).   Tawny Asal, MD 08/01/2017, 6:49 AM Pager: [phone]

## 2017-08-02 DIAGNOSIS — F1211 Cannabis abuse, in remission: Secondary | ICD-10-CM

## 2017-08-02 DIAGNOSIS — E1140 Type 2 diabetes mellitus with diabetic neuropathy, unspecified: Secondary | ICD-10-CM

## 2017-08-02 LAB — GLUCOSE, CAPILLARY
GLUCOSE-CAPILLARY: 321 mg/dL — AB (ref 65–99)
Glucose-Capillary: 340 mg/dL — ABNORMAL HIGH (ref 65–99)

## 2017-08-02 MED ORDER — LOSARTAN POTASSIUM 50 MG PO TABS: 50.0000 mg | ORAL_TABLET | Freq: Every day | ORAL | 1 refills | Status: DC

## 2017-08-02 MED ORDER — GUAIFENESIN-CODEINE 100-10 MG/5ML PO SOLN: 5.0000 mL | Freq: Four times a day (QID) | ORAL | 0 refills | Status: DC | PRN

## 2017-08-02 MED ORDER — METFORMIN HCL 1000 MG PO TABS: 1000.0000 mg | ORAL_TABLET | Freq: Two times a day (BID) | ORAL | 3 refills | Status: DC

## 2017-08-02 MED ORDER — PHENOL 1.4 % MT LIQD: 2.0000 | Freq: Three times a day (TID) | OROMUCOSAL | 0 refills | Status: DC

## 2017-08-02 MED ORDER — BECLOMETHASONE DIPROPIONATE 80 MCG/ACT IN AERS: 2.0000 | INHALATION_SPRAY | Freq: Two times a day (BID) | RESPIRATORY_TRACT | 3 refills | Status: DC

## 2017-08-02 NOTE — Progress Notes (Signed)
Discharge instructions and medications discussed with patient.  Prescription given to patient.  All questions answered.  

## 2017-08-02 NOTE — Progress Notes (Signed)
   Subjective: No further episodes of respiratory distress as described in short progress note yesterday. Pt states she required a prn breathing treatment overnight. Cough regimen and throat spray has provided improvement in cold sx.   She feels the recent difficulty in her asthma control is due to living with her daughter who smokes, also feels there is more dust in the house than she is used to. She is working with a Tourist information centre manager to find an independent apartment which she is hopeful will be finalized by the end of the month.   Objective:  Vital signs in last 24 hours: Vitals:   08/01/17 1709 08/01/17 2009 08/01/17 2047 08/02/17 0512  BP: (!) 141/96  131/82 (!) 139/91  Pulse: 98  93 98  Resp: 20  19 20   Temp: 98.3 F (36.8 C)  98.5 F (36.9 C) 97.9 F (36.6 C)  TempSrc: Oral  Oral Oral  SpO2: 99% 97% 96% 97%  Weight:   187 lb 9.8 oz (85.1 kg)   Height:       Physical Exam  Constitutional: She is oriented to person, place, and time. She appears well-developed and well-nourished.  HENT:  Head: Normocephalic and atraumatic.  Erythema of pharynx, no exudates or thrush   Eyes: Pupils are equal, round, and reactive to light.  Cardiovascular: Normal rate and regular rhythm.   Pulmonary/Chest: Effort normal and breath sounds normal. No respiratory distress. She has no wheezes.  Abdominal: Soft. There is no tenderness.  Musculoskeletal: She exhibits no edema.  Lymphadenopathy:    She has no cervical adenopathy.  Neurological: She is alert and oriented to person, place, and time.  Skin: Skin is warm and dry.     Assessment/Plan:  Asthma Exacerbation secondary to viral pharyngitis  Pt recently discharged from an admission for asthma exacerbation, returned with wheezing, SOB in the setting of sore throat and cough. Likely a viral URI leading to recurrent asthma exacerbation. Initially there was concern for oral thrush, only pharyngeal erythema on subsequent exam. She has not been  hypoxic and has had improvement in her breathing with scheduled nebulizers and steroids, also providing symptomatic relief for sore throat and cough.   --Scheduled albuterol nebulizer BID and q4hrs prn --Budesonide nebulizer BID --Discontinue steroids --Symptomatic tx with guaifenesin, robitussin, tessalon, phenol spray  --Magic mouthwash with lidocaine   Diabetes Mellitus, Type 2, poorly controlled  A1c of 9.0% during last admission, currently on Glipizide 5 mg daily at home. Pt has not been able to follow up with her PCP as advised on prior discharge. She will need increased treatment for better control.  --SSI    Hypertension  Pt is on home Lisinopril for blood pressure control. Switched to Losartan in case ACE inhibitor was contributing to patient cough. Her BP has been stable since admission.  --Continue Losartan 50 mg   Dispo: Anticipated discharge in approximately 0-1 day(s).   Tawny Asal, MD 08/02/2017, 6:56 AM Pager: [phone]

## 2017-08-04 ENCOUNTER — Encounter: Payer: Self-pay | Admitting: Adult Health

## 2017-08-04 ENCOUNTER — Encounter: Payer: Self-pay | Admitting: *Deleted

## 2017-08-04 ENCOUNTER — Ambulatory Visit (INDEPENDENT_AMBULATORY_CARE_PROVIDER_SITE_OTHER): Payer: Self-pay | Admitting: Adult Health

## 2017-08-04 VITALS — BP 136/86 | HR 88 | Ht 61.0 in | Wt 188.0 lb

## 2017-08-04 DIAGNOSIS — E1142 Type 2 diabetes mellitus with diabetic polyneuropathy: Secondary | ICD-10-CM

## 2017-08-04 DIAGNOSIS — J45901 Unspecified asthma with (acute) exacerbation: Secondary | ICD-10-CM

## 2017-08-04 MED ORDER — LEVALBUTEROL HCL 0.63 MG/3ML IN NEBU
0.6300 mg | INHALATION_SOLUTION | Freq: Once | RESPIRATORY_TRACT | Status: AC
  Administered 2017-08-04: 0.63 mg via RESPIRATORY_TRACT

## 2017-08-04 MED ORDER — BUDESONIDE-FORMOTEROL FUMARATE 160-4.5 MCG/ACT IN AERO: 2.0000 | INHALATION_SPRAY | Freq: Two times a day (BID) | RESPIRATORY_TRACT | 0 refills | Status: DC

## 2017-08-04 MED ORDER — PREDNISONE 10 MG PO TABS: ORAL_TABLET | ORAL | 0 refills | Status: DC

## 2017-08-04 NOTE — Progress Notes (Signed)
@Patient  ID: [name], female    DOB: 1966-05-15, 51 y.o.   MRN: [id number]  Chief Complaint  Patient presents with  . Follow-up    Asthma     Referring provider: Marliss Coots, NP  HPI: 51 yo female former smoker seen for pulmonary consult 06/2017 for Asthma exacerbation   08/04/2017 Post hospital follow up  Pt returns for hospital follow up for asthma . She was recently admitted x 4 in last 2 months for asthma exacerbations. She was seen for pulmonary consult for asthma exacerbation. She was treated with steroids and nebs. Lisinopril was stopped. Started on QVAR at discharge. She says she feels some better but still has cough and wheezing. Wheezing more today.  She does not have insurance , uses [employer] card.  PFT in June showed an FEV1 at 94%, ratio 86, FVC 87%, DLCO 69%. She denies any chest pain, orthopnea, PND, or increased leg swelling  No Known Allergies  Immunization History  Administered Date(s) Administered  . Influenza,inj,Quad PF,36+ Mos 09/29/2015  . Pneumococcal Polysaccharide-23 09/29/2015    Past Medical History:  Diagnosis Date  . Asthma   . Depression   . Hypertension     Tobacco History: History  Smoking Status  . Former Smoker  . Packs/day: 3.00  . Years: 10.00  . Types: Cigarettes  . Quit date: 12/22/2004  Smokeless Tobacco  . Never Used   Counseling given: Not Answered   Outpatient Encounter Prescriptions as of 08/04/2017  Medication Sig  . albuterol (PROVENTIL HFA;VENTOLIN HFA) 108 (90 Base) MCG/ACT inhaler Inhale 1-2 puffs into the lungs every 6 (six) hours as needed for wheezing or shortness of breath.  Marland Kitchen albuterol (PROVENTIL) (2.5 MG/3ML) 0.083% nebulizer solution Take 3 mLs (2.5 mg total) by nebulization every 2 (two) hours as needed for wheezing or shortness of breath.  . beclomethasone (QVAR) 80 MCG/ACT inhaler Inhale 2 puffs into the lungs 2 (two) times daily.  . chlorpheniramine-HYDROcodone (TUSSIONEX) 10-8 MG/5ML SUER Take  5 mLs by mouth every 12 (twelve) hours. (Patient taking differently: Take 5 mLs by mouth every 12 (twelve) hours as needed for cough. )  . cyclobenzaprine (FLEXERIL) 10 MG tablet Take 1 tablet (10 mg total) by mouth 2 (two) times daily as needed for muscle spasms.  . diclofenac sodium (VOLTAREN) 1 % GEL Apply 2 g topically 4 (four) times daily. (Patient taking differently: Apply 2 g topically 4 (four) times daily as needed (pain). )  . gabapentin (NEURONTIN) 300 MG capsule Take 1 capsule (300 mg total) by mouth 2 (two) times daily.  Marland Kitchen glipiZIDE (GLUCOTROL) 5 MG tablet Take 1 tablet (5 mg total) by mouth every morning.  Marland Kitchen guaiFENesin (MUCINEX) 600 MG 12 hr tablet Take 600 mg by mouth 2 (two) times daily as needed for cough.   Marland Kitchen guaiFENesin-codeine 100-10 MG/5ML syrup Take 5 mLs by mouth every 6 (six) hours as needed for cough.  . loratadine (CLARITIN) 10 MG tablet Take 1 tablet (10 mg total) by mouth daily.  Marland Kitchen losartan (COZAAR) 50 MG tablet Take 1 tablet (50 mg total) by mouth daily.  . metFORMIN (GLUCOPHAGE) 1000 MG tablet Take 1 tablet (1,000 mg total) by mouth 2 (two) times daily with a meal. See discharge instructions for how to take medicine at first  . omeprazole (PRILOSEC) 20 MG capsule Take 2 capsules (40 mg total) by mouth daily.  . phenol (CHLORASEPTIC) 1.4 % LIQD Use as directed 2 sprays in the mouth or throat 3 (three)  times daily.  . polyethylene glycol (MIRALAX / GLYCOLAX) packet Take 17 g by mouth daily as needed for mild constipation.   . sertraline (ZOLOFT) 50 MG tablet Take 75 mg by mouth every morning.   . predniSONE (DELTASONE) 10 MG tablet 4 tabs daily  for 3  days,  then stop.  . [EXPIRED] levalbuterol (XOPENEX) nebulizer solution 0.63 mg    No facility-administered encounter medications on file as of 08/04/2017.      Review of Systems  Constitutional:   No  weight loss, night sweats,  Fevers, chills,  +fatigue, or  lassitude.  HEENT:   No headaches,  Difficulty  swallowing,  Tooth/dental problems, or  Sore throat,                No sneezing, itching, ear ache, nasal congestion, post nasal drip,   CV:  No chest pain,  Orthopnea, PND, swelling in lower extremities, anasarca, dizziness, palpitations, syncope.   GI  No heartburn, indigestion, abdominal pain, nausea, vomiting, diarrhea, change in bowel habits, loss of appetite, bloody stools.   Resp:   No chest wall deformity  Skin: no rash or lesions.  GU: no dysuria, change in color of urine, no urgency or frequency.  No flank pain, no hematuria   MS:  No joint pain or swelling.  No decreased range of motion.  No back pain.    Physical Exam  BP 136/86 (BP Location: Right Arm, Cuff Size: Normal)   Pulse 88   Ht 5\' 1"  (1.549 m)   Wt 188 lb (85.3 kg)   SpO2 97%   BMI 35.52 kg/m   GEN: A/Ox3; pleasant , NAD , thin    HEENT:  [place]/AT,  EACs-clear, TMs-wnl, NOSE-clear, THROAT-clear, no lesions, no postnasal drip or exudate noted. Poor dentition .   NECK:  Supple w/ fair ROM; no JVD; normal carotid impulses w/o bruits; no thyromegaly or nodules palpated; no lymphadenopathy.   No stridor   RESP  Exp wheezing noted, +psuedowheezing noted.  no accessory muscle use, no dullness to percussion, speaks in full sentences.   CARD:  RRR, no m/r/g, no peripheral edema, pulses intact, no cyanosis or clubbing.  GI:   Soft & nt; nml bowel sounds; no organomegaly or masses detected.   Musco: Warm bil, no deformities or joint swelling noted.   Neuro: alert, no focal deficits noted.    Skin: Warm, no lesions or rashes    Lab Results:  CBC  No results found for: PROBNP  Imaging: Dg Neck Soft Tissue  Result Date: 07/30/2017 CLINICAL DATA:  Sore throat with inability to swallow EXAM: NECK SOFT TISSUES - 1+ VIEW COMPARISON:  None. FINDINGS: Epiglottis appears normal. There is soft tissue thickening in the aryepiglottic region with localized narrowing of the subglottic trachea. Prevertebral soft tissues  are normal. No air-fluid level to suggest abscess. Tongue base region appears normal. Bony structures appear normal. Visualized upper lung zones appear normal. IMPRESSION: Soft tissue prominence involving the aryepiglottic folds with focal narrowing of the subglottic trachea. Epiglottis appears normal. Study otherwise unremarkable. The soft tissue prominence of the aryepiglottic folds and narrowing of the subglottic trachea may warrant contrast enhanced CT of the neck to further evaluate and/or ENT assessment given the patient's clinical symptoms. Electronically Signed   By: Lowella Grip III M.D.   On: 07/30/2017 14:04   Dg Chest 2 View  Result Date: 07/30/2017 CLINICAL DATA:  Difficulty swallowing for several days, coughing, throat pain, asthma, hypertension EXAM: CHEST  2 VIEW COMPARISON:  07/20/2017 FINDINGS: Normal heart size, mediastinal contours, and pulmonary vascularity. Lungs clear. No pleural effusion or pneumothorax. No acute osseous findings. IMPRESSION: No acute abnormalities. Electronically Signed   By: Lavonia Dana M.D.   On: 07/30/2017 13:27   Ct Soft Tissue Neck W Contrast  Result Date: 07/30/2017 CLINICAL DATA:  Sore throat/stridor. Tonsillitis/epiglottitis suspected. EXAM: CT NECK WITH CONTRAST TECHNIQUE: Multidetector CT imaging of the neck was performed using the standard protocol following the bolus administration of intravenous contrast. CONTRAST:  75 cc Isovue-300 intravenous COMPARISON:  Radiography from earlier today FINDINGS: Pharynx and larynx: No noted submucosal edema, epiglottic thickening or inflammatory enhancement. Tonsils not appear thickened. Negative for abscess. The infraglottic airway is widely patent and the trachea is not thickened. Salivary glands: No inflammation, mass, or stone. Thyroid: Enlarged right lobe with lower pole solid nodule measuring at least 23 mm. Lymph nodes: None enlarged or abnormal density. Vascular: ICA tortuosity before the skullbase. Limited  intracranial: Negative Visualized orbits: Negative for mass or inflammation. Mastoids and visualized paranasal sinuses: Clear Skeleton: No acute or aggressive finding Upper chest: Negative IMPRESSION: 1. No acute finding. No sign of tonsillitis/supraglottitis. Negative for abscess. 2. Right thyroid nodule measuring 2.3 cm. Recommend outpatient sonography. Electronically Signed   By: Monte Fantasia M.D.   On: 07/30/2017 17:15   Dg Chest Port 1 View  Result Date: 07/20/2017 CLINICAL DATA:  Short of breath EXAM: PORTABLE CHEST 1 VIEW COMPARISON:  06/12/2017 FINDINGS: The heart size and mediastinal contours are within normal limits. Both lungs are clear. The visualized skeletal structures are unremarkable. IMPRESSION: No active disease. Electronically Signed   By: Franchot Gallo M.D.   On: 07/20/2017 11:19     Assessment & Plan:   Asthma exacerbation Frequent exacerbations No insurance is major barrier with high cost of inhaler meds.  Will change to Symbicort w/ 2 samples given, start pt assistance program  xopenex neb given with resolution of wheezing .  tx for triggers - GERD/AR.  Avoid ACE inhibitors.  Brief steroids x 3 days , advised on BS , will need to call PCP for DM control   Plan  Patient Instructions  Stop Qvar Begin Symbicort 2 puffs twice daily, rinse after use. This is your everyday medicine /controller Use Albuterol Inhaler 2 puffs every 4hrs as needed. -This is your rescue inhaler . /Emergency inhaler.  Prednisone 40 mg daily for 3 days. Call your primary doctor or diabetic doctor if your blood sugars are greater than 300. Use Zyrtec 10 mg at bedtime as needed for drainage Begin Prilosec over-the-counter 20 mg daily before meal. Make sure you are no longer taking lisinopril as this medicine can make your cough and wheezing worse. Follow-up with Dr. Vaughan Browner in 6 weeks  and As needed   Please contact office for sooner follow up if symptoms do not improve or worsen or seek  emergency care       Diabetes mellitus ([place]) Poorly controlled DM - brief steroids only  Call if BS >300. PCP      Rexene Edison, NP 08/04/2017

## 2017-08-04 NOTE — Addendum Note (Signed)
Addended by: Parke Poisson E on: 08/04/2017 04:11 PM   Modules accepted: Orders

## 2017-08-04 NOTE — Assessment & Plan Note (Addendum)
Frequent exacerbations No insurance is major barrier with high cost of inhaler meds.  Will change to Symbicort w/ 2 samples given, start pt assistance program  xopenex neb given with resolution of wheezing .  tx for triggers - GERD/AR.  Avoid ACE inhibitors.  Brief steroids x 3 days , advised on BS , will need to call PCP for DM control   Plan  Patient Instructions  Stop Qvar Begin Symbicort 2 puffs twice daily, rinse after use. This is your everyday medicine /controller Use Albuterol Inhaler 2 puffs every 4hrs as needed. -This is your rescue inhaler . /Emergency inhaler.  Prednisone 40 mg daily for 3 days. Call your primary doctor or diabetic doctor if your blood sugars are greater than 300. Use Zyrtec 10 mg at bedtime as needed for drainage Begin Prilosec over-the-counter 20 mg daily before meal. Make sure you are no longer taking lisinopril as this medicine can make your cough and wheezing worse. Follow-up with Dr. Vaughan Browner in 6 weeks  and As needed   Please contact office for sooner follow up if symptoms do not improve or worsen or seek emergency care

## 2017-08-04 NOTE — Assessment & Plan Note (Signed)
Poorly controlled DM - brief steroids only  Call if BS >300. PCP

## 2017-08-04 NOTE — Patient Instructions (Addendum)
Stop Qvar Begin Symbicort 2 puffs twice daily, rinse after use. This is your everyday medicine /controller Use Albuterol Inhaler 2 puffs every 4hrs as needed. -This is your rescue inhaler . /Emergency inhaler.  Prednisone 40 mg daily for 3 days. Call your primary doctor or diabetic doctor if your blood sugars are greater than 300. Use Zyrtec 10 mg at bedtime as needed for drainage Begin Prilosec over-the-counter 20 mg daily before meal. Make sure you are no longer taking lisinopril as this medicine can make your cough and wheezing worse. Follow-up with Dr. Vaughan Browner in 6 weeks  and As needed   Please contact office for sooner follow up if symptoms do not improve or worsen or seek emergency care

## 2017-09-24 ENCOUNTER — Ambulatory Visit: Payer: Self-pay | Admitting: Pulmonary Disease

## 2018-04-11 IMAGING — DX DG CHEST 2V
3 series · 3 of 3 positions shown · non-contrast
Comparison: 07/20/2017

CLINICAL DATA: Difficulty swallowing for several days, coughing,
throat pain, asthma, hypertension

EXAM:
CHEST  2 VIEW

[chest pa]
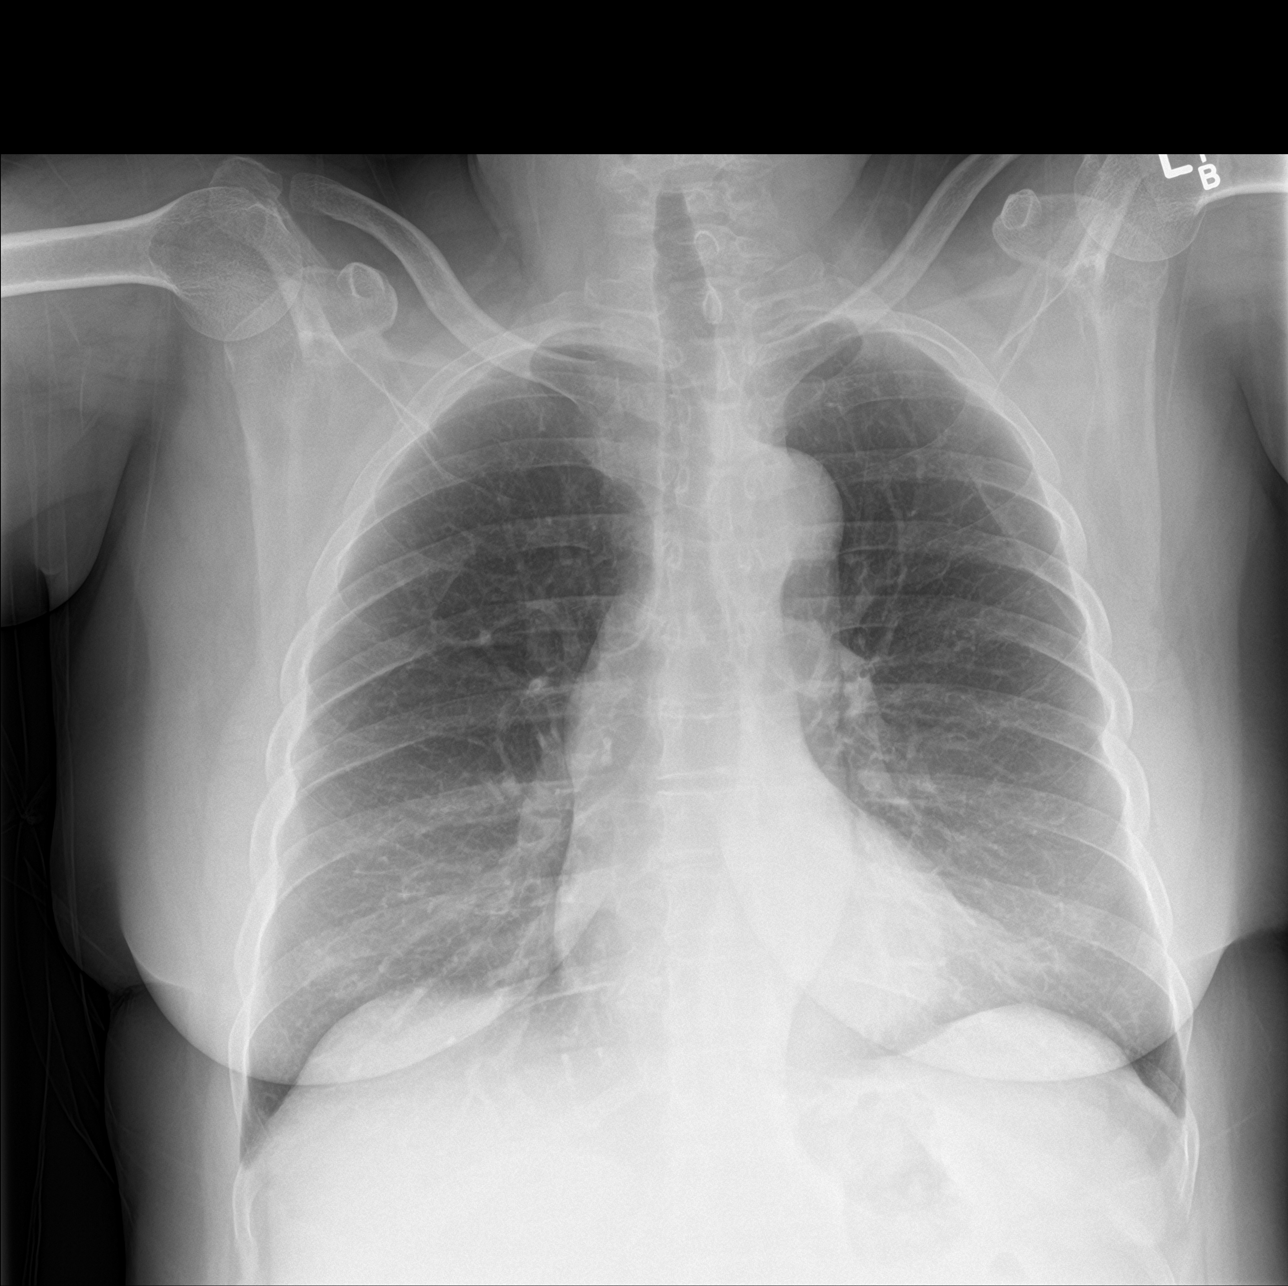

[chest lat (1 of 2)]
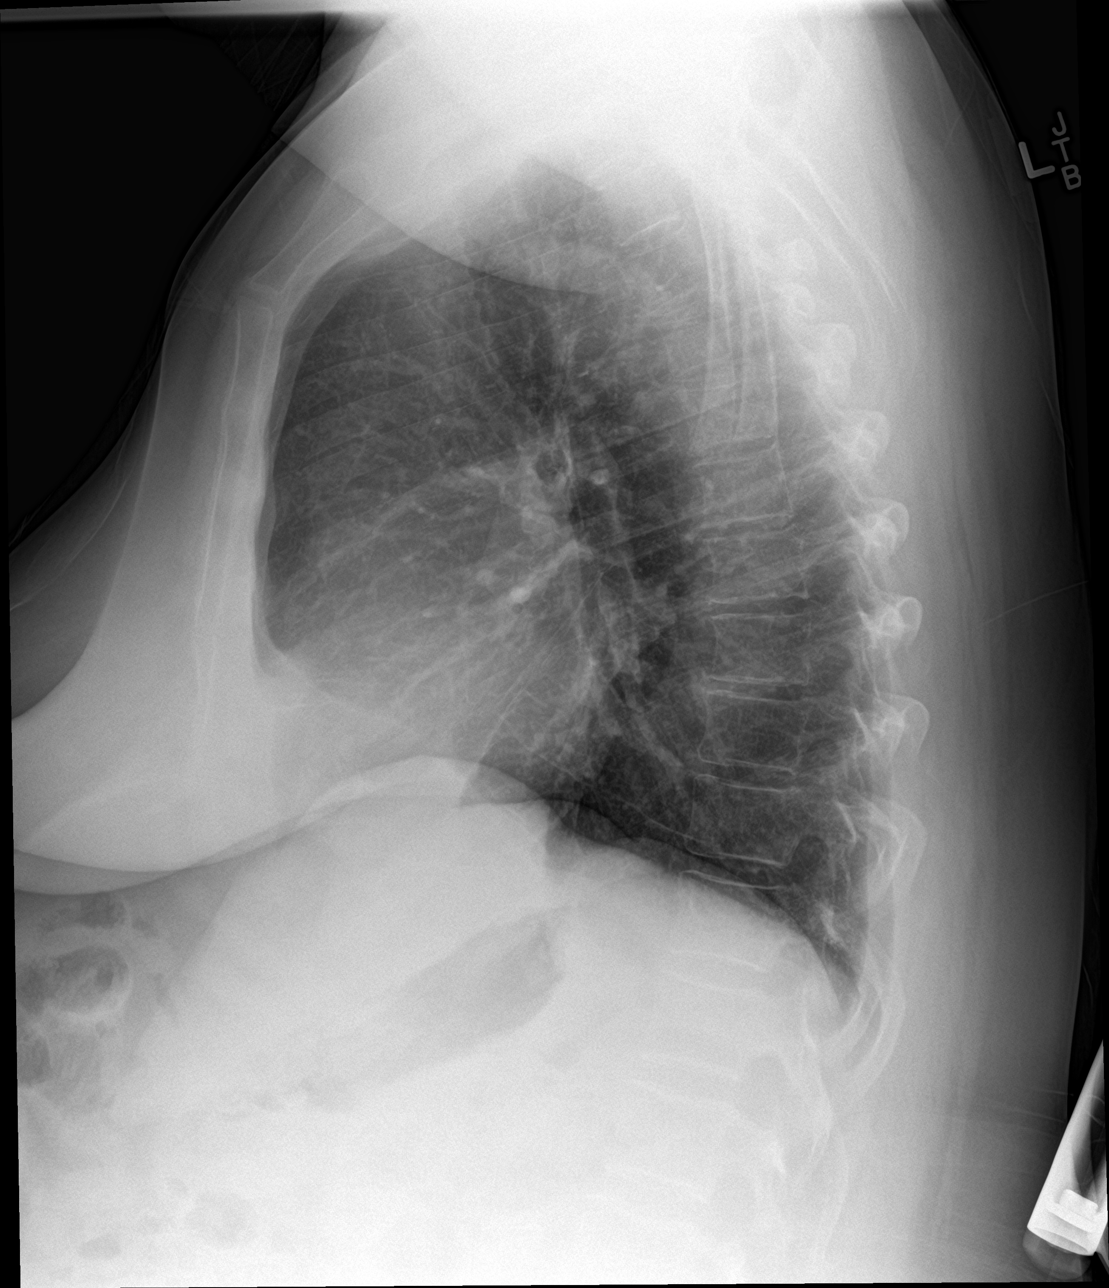

[chest lat (2 of 2)]
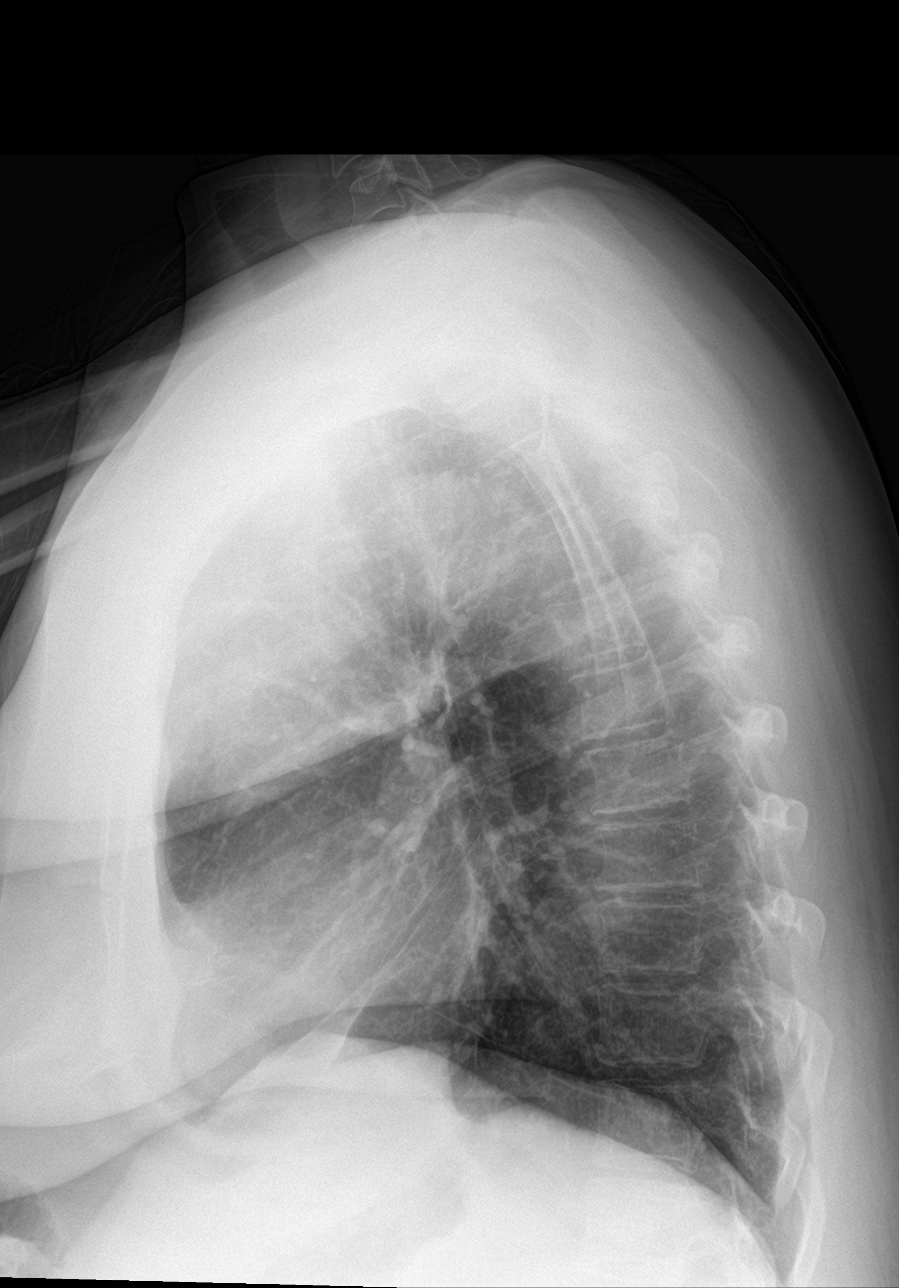

[3 of 3 positions shown; findings below may reference images not displayed]

FINDINGS: Normal heart size, mediastinal contours, and pulmonary vascularity.

Lungs clear.

No pleural effusion or pneumothorax.

No acute osseous findings.
IMPRESSION: No acute abnormalities.

## 2019-01-26 ENCOUNTER — Encounter: Payer: Self-pay | Admitting: Emergency Medicine

## 2019-01-26 ENCOUNTER — Other Ambulatory Visit: Payer: Self-pay

## 2019-01-26 ENCOUNTER — Emergency Department: Payer: Self-pay

## 2019-01-26 ENCOUNTER — Inpatient Hospital Stay
Admission: EM | Admit: 2019-01-26 | Discharge: 2019-01-28 | DRG: 202 | Disposition: A | Discharge status: Home / Self Care | Payer: Self-pay | Attending: Internal Medicine | Admitting: Internal Medicine

## 2019-01-26 DIAGNOSIS — E1149 Type 2 diabetes mellitus with other diabetic neurological complication: Secondary | ICD-10-CM

## 2019-01-26 DIAGNOSIS — I509 Heart failure, unspecified: Secondary | ICD-10-CM | POA: Diagnosis present

## 2019-01-26 DIAGNOSIS — J101 Influenza due to other identified influenza virus with other respiratory manifestations: Secondary | ICD-10-CM

## 2019-01-26 DIAGNOSIS — N179 Acute kidney failure, unspecified: Secondary | ICD-10-CM

## 2019-01-26 DIAGNOSIS — J111 Influenza due to unidentified influenza virus with other respiratory manifestations: Secondary | ICD-10-CM | POA: Diagnosis present

## 2019-01-26 DIAGNOSIS — Z90710 Acquired absence of both cervix and uterus: Secondary | ICD-10-CM

## 2019-01-26 DIAGNOSIS — J4520 Mild intermittent asthma, uncomplicated: Secondary | ICD-10-CM

## 2019-01-26 DIAGNOSIS — I110 Hypertensive heart disease with heart failure: Secondary | ICD-10-CM | POA: Diagnosis present

## 2019-01-26 DIAGNOSIS — Z87891 Personal history of nicotine dependence: Secondary | ICD-10-CM

## 2019-01-26 DIAGNOSIS — F329 Major depressive disorder, single episode, unspecified: Secondary | ICD-10-CM | POA: Diagnosis present

## 2019-01-26 DIAGNOSIS — E1140 Type 2 diabetes mellitus with diabetic neuropathy, unspecified: Secondary | ICD-10-CM | POA: Diagnosis present

## 2019-01-26 DIAGNOSIS — Z7951 Long term (current) use of inhaled steroids: Secondary | ICD-10-CM

## 2019-01-26 DIAGNOSIS — E860 Dehydration: Secondary | ICD-10-CM | POA: Diagnosis present

## 2019-01-26 DIAGNOSIS — Z8249 Family history of ischemic heart disease and other diseases of the circulatory system: Secondary | ICD-10-CM

## 2019-01-26 DIAGNOSIS — J45901 Unspecified asthma with (acute) exacerbation: Principal | ICD-10-CM | POA: Diagnosis present

## 2019-01-26 DIAGNOSIS — Z7984 Long term (current) use of oral hypoglycemic drugs: Secondary | ICD-10-CM

## 2019-01-26 DIAGNOSIS — E1165 Type 2 diabetes mellitus with hyperglycemia: Secondary | ICD-10-CM

## 2019-01-26 DIAGNOSIS — F1290 Cannabis use, unspecified, uncomplicated: Secondary | ICD-10-CM | POA: Diagnosis present

## 2019-01-26 HISTORY — DX: Acute kidney failure, unspecified: N17.9

## 2019-01-26 LAB — GLUCOSE, CAPILLARY
Glucose-Capillary: 236 mg/dL — ABNORMAL HIGH (ref 70–99)
Glucose-Capillary: 351 mg/dL — ABNORMAL HIGH (ref 70–99)
Glucose-Capillary: 420 mg/dL — ABNORMAL HIGH (ref 70–99)
Glucose-Capillary: 538 mg/dL (ref 70–99)
Glucose-Capillary: 540 mg/dL (ref 70–99)

## 2019-01-26 LAB — COMPREHENSIVE METABOLIC PANEL
ALT: 37 U/L (ref 0–44)
ANION GAP: 15 (ref 5–15)
AST: 45 U/L — ABNORMAL HIGH (ref 15–41)
Albumin: 4.3 g/dL (ref 3.5–5.0)
Alkaline Phosphatase: 106 U/L (ref 38–126)
BILIRUBIN TOTAL: 0.9 mg/dL (ref 0.3–1.2)
BUN: 37 mg/dL — ABNORMAL HIGH (ref 6–20)
CO2: 21 mmol/L — ABNORMAL LOW (ref 22–32)
Calcium: 9.1 mg/dL (ref 8.9–10.3)
Chloride: 95 mmol/L — ABNORMAL LOW (ref 98–111)
Creatinine, Ser: 2.66 mg/dL — ABNORMAL HIGH (ref 0.44–1.00)
GFR calc Af Amer: 23 mL/min — ABNORMAL LOW (ref >60)
GFR, EST NON AFRICAN AMERICAN: 20 mL/min — AB (ref >60)
Glucose, Bld: 386 mg/dL — ABNORMAL HIGH (ref 70–99)
POTASSIUM: 3.7 mmol/L (ref 3.5–5.1)
Sodium: 131 mmol/L — ABNORMAL LOW (ref 135–145)
TOTAL PROTEIN: 8.3 g/dL — AB (ref 6.5–8.1)

## 2019-01-26 LAB — CBC WITH DIFFERENTIAL/PLATELET
Abs Immature Granulocytes: 0.02 10*3/uL (ref 0.00–0.07)
BASOS ABS: 0 10*3/uL (ref 0.0–0.1)
Basophils Relative: 0 %
EOS ABS: 0 10*3/uL (ref 0.0–0.5)
EOS PCT: 0 %
HEMATOCRIT: 49.8 % — AB (ref 36.0–46.0)
HEMOGLOBIN: 16 g/dL — AB (ref 12.0–15.0)
Immature Granulocytes: 0 %
LYMPHS ABS: 1.5 10*3/uL (ref 0.7–4.0)
Lymphocytes Relative: 22 %
MCH: 28 pg (ref 26.0–34.0)
MCHC: 32.1 g/dL (ref 30.0–36.0)
MCV: 87.2 fL (ref 80.0–100.0)
MONO ABS: 0.8 10*3/uL (ref 0.1–1.0)
MONOS PCT: 12 %
NRBC: 0 % (ref 0.0–0.2)
Neutro Abs: 4.3 10*3/uL (ref 1.7–7.7)
Neutrophils Relative %: 66 %
Platelets: 241 10*3/uL (ref 150–400)
RBC: 5.71 MIL/uL — ABNORMAL HIGH (ref 3.87–5.11)
RDW: 14.2 % (ref 11.5–15.5)
WBC: 6.6 10*3/uL (ref 4.0–10.5)

## 2019-01-26 LAB — BLOOD GAS, VENOUS
Acid-base deficit: 4.3 mmol/L — ABNORMAL HIGH (ref 0.0–2.0)
Bicarbonate: 17.8 mmol/L — ABNORMAL LOW (ref 20.0–28.0)
O2 Saturation: 95 %
Patient temperature: 37
pCO2, Ven: 25 mmHg — ABNORMAL LOW (ref 44.0–60.0)
pH, Ven: 7.46 — ABNORMAL HIGH (ref 7.250–7.430)
pO2, Ven: 71 mmHg — ABNORMAL HIGH (ref 32.0–45.0)

## 2019-01-26 LAB — BRAIN NATRIURETIC PEPTIDE: B NATRIURETIC PEPTIDE 5: 39 pg/mL (ref 0.0–100.0)

## 2019-01-26 LAB — TROPONIN I: Troponin I: <0.03 ng/mL (ref <0.03)

## 2019-01-26 LAB — INFLUENZA PANEL BY PCR (TYPE A & B)
Influenza A By PCR: POSITIVE — AB
Influenza B By PCR: NEGATIVE

## 2019-01-26 MED ORDER — DOCUSATE SODIUM 100 MG PO CAPS: 100.0000 mg | ORAL_CAPSULE | Freq: Two times a day (BID) | ORAL | Status: DC | PRN

## 2019-01-26 MED ORDER — SODIUM CHLORIDE 0.9 % IV SOLN
INTRAVENOUS | Status: DC
  Administered 2019-01-26: 16:00:00 via INTRAVENOUS

## 2019-01-26 MED ORDER — METHYLPREDNISOLONE SODIUM SUCC 125 MG IJ SOLR
INTRAMUSCULAR | Status: AC
  Administered 2019-01-26: 125 mg via INTRAVENOUS
  Filled 2019-01-26: qty 2

## 2019-01-26 MED ORDER — GUAIFENESIN-DM 100-10 MG/5ML PO SYRP
5.0000 mL | ORAL_SOLUTION | ORAL | Status: DC | PRN
  Filled 2019-01-26 (×2): qty 5

## 2019-01-26 MED ORDER — SODIUM CHLORIDE 0.9 % IV SOLN
Freq: Once | INTRAVENOUS | Status: AC
  Administered 2019-01-26: 12:00:00 via INTRAVENOUS

## 2019-01-26 MED ORDER — SODIUM CHLORIDE 0.9 % IV BOLUS
1000.0000 mL | Freq: Once | INTRAVENOUS | Status: AC
  Administered 2019-01-26: 1000 mL via INTRAVENOUS

## 2019-01-26 MED ORDER — SERTRALINE HCL 50 MG PO TABS
75.0000 mg | ORAL_TABLET | ORAL | Status: DC
  Administered 2019-01-27 – 2019-01-28 (×2): 75 mg via ORAL
  Filled 2019-01-26 (×2): qty 2

## 2019-01-26 MED ORDER — IPRATROPIUM-ALBUTEROL 0.5-2.5 (3) MG/3ML IN SOLN
3.0000 mL | Freq: Once | RESPIRATORY_TRACT | Status: AC
  Administered 2019-01-26: 3 mL via RESPIRATORY_TRACT

## 2019-01-26 MED ORDER — PANTOPRAZOLE SODIUM 40 MG PO TBEC
40.0000 mg | DELAYED_RELEASE_TABLET | Freq: Every day | ORAL | Status: DC
  Administered 2019-01-27 – 2019-01-28 (×2): 40 mg via ORAL
  Filled 2019-01-26 (×2): qty 1

## 2019-01-26 MED ORDER — BENZONATATE 100 MG PO CAPS
200.0000 mg | ORAL_CAPSULE | Freq: Three times a day (TID) | ORAL | Status: DC | PRN
  Administered 2019-01-26: 200 mg via ORAL
  Filled 2019-01-26: qty 2

## 2019-01-26 MED ORDER — MORPHINE SULFATE (PF) 4 MG/ML IV SOLN
4.0000 mg | Freq: Once | INTRAVENOUS | Status: AC
  Administered 2019-01-26: 4 mg via INTRAVENOUS
  Filled 2019-01-26: qty 1

## 2019-01-26 MED ORDER — OSELTAMIVIR PHOSPHATE 30 MG PO CAPS: 30.0000 mg | ORAL_CAPSULE | Freq: Every day | ORAL | Status: DC

## 2019-01-26 MED ORDER — IPRATROPIUM-ALBUTEROL 0.5-2.5 (3) MG/3ML IN SOLN: 3.0000 mL | RESPIRATORY_TRACT | Status: DC | PRN

## 2019-01-26 MED ORDER — ACETAMINOPHEN 325 MG PO TABS: 650.0000 mg | ORAL_TABLET | Freq: Four times a day (QID) | ORAL | Status: DC | PRN

## 2019-01-26 MED ORDER — IPRATROPIUM-ALBUTEROL 0.5-2.5 (3) MG/3ML IN SOLN
RESPIRATORY_TRACT | Status: AC
  Filled 2019-01-26: qty 9

## 2019-01-26 MED ORDER — GUAIFENESIN-CODEINE 100-10 MG/5ML PO SOLN
5.0000 mL | Freq: Four times a day (QID) | ORAL | Status: DC | PRN
  Administered 2019-01-27 (×3): 5 mL via ORAL
  Filled 2019-01-26 (×3): qty 5

## 2019-01-26 MED ORDER — BUDESONIDE 0.5 MG/2ML IN SUSP
0.5000 mg | Freq: Two times a day (BID) | RESPIRATORY_TRACT | Status: DC
  Administered 2019-01-26 – 2019-01-28 (×4): 0.5 mg via RESPIRATORY_TRACT
  Filled 2019-01-26 (×5): qty 2

## 2019-01-26 MED ORDER — LORATADINE 10 MG PO TABS
10.0000 mg | ORAL_TABLET | Freq: Every day | ORAL | Status: DC
  Administered 2019-01-27 – 2019-01-28 (×2): 10 mg via ORAL
  Filled 2019-01-26 (×2): qty 1

## 2019-01-26 MED ORDER — INSULIN ASPART 100 UNIT/ML ~~LOC~~ SOLN: 0.0000 [IU] | Freq: Three times a day (TID) | SUBCUTANEOUS | Status: DC

## 2019-01-26 MED ORDER — LEVALBUTEROL HCL 1.25 MG/0.5ML IN NEBU: 1.2500 mg | INHALATION_SOLUTION | Freq: Four times a day (QID) | RESPIRATORY_TRACT | Status: DC | PRN

## 2019-01-26 MED ORDER — METHYLPREDNISOLONE SODIUM SUCC 125 MG IJ SOLR
60.0000 mg | Freq: Four times a day (QID) | INTRAMUSCULAR | Status: DC
  Administered 2019-01-26 – 2019-01-28 (×7): 60 mg via INTRAVENOUS
  Filled 2019-01-26 (×7): qty 2

## 2019-01-26 MED ORDER — OSELTAMIVIR PHOSPHATE 75 MG PO CAPS
75.0000 mg | ORAL_CAPSULE | Freq: Once | ORAL | Status: AC
  Administered 2019-01-26: 75 mg via ORAL
  Filled 2019-01-26: qty 1

## 2019-01-26 MED ORDER — INSULIN ASPART 100 UNIT/ML ~~LOC~~ SOLN
15.0000 [IU] | Freq: Once | SUBCUTANEOUS | Status: AC
  Administered 2019-01-26: 15 [IU] via INTRAVENOUS
  Filled 2019-01-26: qty 1

## 2019-01-26 MED ORDER — POLYETHYLENE GLYCOL 3350 17 G PO PACK: 17.0000 g | PACK | Freq: Every day | ORAL | Status: DC | PRN

## 2019-01-26 MED ORDER — CYCLOBENZAPRINE HCL 10 MG PO TABS: 10.0000 mg | ORAL_TABLET | Freq: Two times a day (BID) | ORAL | Status: DC | PRN

## 2019-01-26 MED ORDER — HEPARIN SODIUM (PORCINE) 5000 UNIT/ML IJ SOLN
5000.0000 [IU] | Freq: Three times a day (TID) | INTRAMUSCULAR | Status: DC
  Administered 2019-01-27 – 2019-01-28 (×4): 5000 [IU] via SUBCUTANEOUS
  Filled 2019-01-26 (×4): qty 1

## 2019-01-26 MED ORDER — METHYLPREDNISOLONE SODIUM SUCC 125 MG IJ SOLR
125.0000 mg | Freq: Once | INTRAMUSCULAR | Status: AC
  Administered 2019-01-26: 125 mg via INTRAVENOUS

## 2019-01-26 MED ORDER — ALBUTEROL SULFATE (2.5 MG/3ML) 0.083% IN NEBU: 3.0000 mL | INHALATION_SOLUTION | Freq: Four times a day (QID) | RESPIRATORY_TRACT | Status: DC | PRN

## 2019-01-26 MED ORDER — INSULIN REGULAR HUMAN 100 UNIT/ML IJ SOLN
15.0000 [IU] | Freq: Once | INTRAMUSCULAR | Status: DC
  Filled 2019-01-26: qty 10

## 2019-01-26 MED ORDER — INSULIN ASPART 100 UNIT/ML ~~LOC~~ SOLN
0.0000 [IU] | Freq: Three times a day (TID) | SUBCUTANEOUS | Status: DC
  Administered 2019-01-27: 09:00:00 11 [IU] via SUBCUTANEOUS
  Filled 2019-01-26: qty 1

## 2019-01-26 MED ORDER — GUAIFENESIN ER 600 MG PO TB12
600.0000 mg | ORAL_TABLET | Freq: Two times a day (BID) | ORAL | Status: DC | PRN
  Administered 2019-01-28: 600 mg via ORAL
  Filled 2019-01-26: qty 1

## 2019-01-26 MED ORDER — INSULIN ASPART 100 UNIT/ML ~~LOC~~ SOLN
15.0000 [IU] | Freq: Once | SUBCUTANEOUS | Status: AC
  Administered 2019-01-26: 15 [IU] via SUBCUTANEOUS
  Filled 2019-01-26: qty 1

## 2019-01-26 MED ORDER — OSELTAMIVIR PHOSPHATE 30 MG PO CAPS
30.0000 mg | ORAL_CAPSULE | Freq: Every day | ORAL | Status: DC
  Filled 2019-01-26: qty 1

## 2019-01-26 MED ORDER — INSULIN ASPART 100 UNIT/ML ~~LOC~~ SOLN: 0.0000 [IU] | Freq: Every day | SUBCUTANEOUS | Status: DC

## 2019-01-26 MED ORDER — GABAPENTIN 300 MG PO CAPS
300.0000 mg | ORAL_CAPSULE | Freq: Two times a day (BID) | ORAL | Status: DC
  Administered 2019-01-26 – 2019-01-28 (×4): 300 mg via ORAL
  Filled 2019-01-26 (×4): qty 1

## 2019-01-26 MED ORDER — OSELTAMIVIR PHOSPHATE 30 MG PO CAPS
30.0000 mg | ORAL_CAPSULE | Freq: Once | ORAL | Status: DC
  Filled 2019-01-26: qty 1

## 2019-01-26 NOTE — ED Notes (Signed)
Patient denies pain and is resting comfortably.  

## 2019-01-26 NOTE — ED Notes (Addendum)
Dr Anselm Jungling notified of CBG result 538mg /dl via messaging.

## 2019-01-26 NOTE — ED Triage Notes (Addendum)
Pt arrived with complaints of generalized body aches, fever, and productive cough since Sunday. Pt reports OTC ibuprofen use; last administration was today at 930. Pt also reports lower back pain and poor urinary out put.

## 2019-01-26 NOTE — H&P (Signed)
[facility] [place] NAME: [name]    MR#:  [id number]  DATE OF BIRTH:  November 24, 1966  DATE OF ADMISSION:  01/26/2019  PRIMARY CARE PHYSICIAN: Patient, No Pcp Per   REQUESTING/REFERRING PHYSICIAN: [place]   CHIEF COMPLAINT:   Chief Complaint  Patient presents with  . Cough  . Generalized Body Aches    HISTORY OF PRESENT ILLNESS: [name]  is a 53 y.o. female with a known history of Htn, Asthma- Had fever, cough, SOB for 3 days. She did not eat much. She was hurting all over and weak. Today had Asthma flare up- so she came to ER. Noted to have flu and ac renal failure.  PAST MEDICAL HISTORY:   Past Medical History:  Diagnosis Date  . Asthma   . Depression   . Hypertension     PAST SURGICAL HISTORY:  Past Surgical History:  Procedure Laterality Date  . ABDOMINAL HYSTERECTOMY    . TUBAL LIGATION      SOCIAL HISTORY:  Social History   Tobacco Use  . Smoking status: Former Smoker    Packs/day: 3.00    Years: 10.00    Pack years: 30.00    Types: Cigarettes    Last attempt to quit: 12/22/2004    Years since quitting: 14.1  . Smokeless tobacco: Never Used  Substance Use Topics  . Alcohol use: No    FAMILY HISTORY:  Family History  Problem Relation Age of Onset  . Hypertension Mother     DRUG ALLERGIES: No Known Allergies  REVIEW OF SYSTEMS:   CONSTITUTIONAL: have fever, fatigue or weakness.  EYES: No blurred or double vision.  EARS, NOSE, AND THROAT: No tinnitus or ear pain.  RESPIRATORY: have cough, shortness of breath, wheezing ,no hemoptysis.  CARDIOVASCULAR: No chest pain, orthopnea, edema.  GASTROINTESTINAL: No nausea, vomiting, diarrhea or abdominal pain.  GENITOURINARY: No dysuria, hematuria.  ENDOCRINE: No polyuria, nocturia,  HEMATOLOGY: No anemia, easy bruising or bleeding SKIN: No rash or lesion. MUSCULOSKELETAL: No joint pain or arthritis.   NEUROLOGIC: No tingling, numbness, weakness.   PSYCHIATRY: No anxiety or depression.   MEDICATIONS AT HOME:  Prior to Admission medications   Medication Sig Start Date End Date Taking? Authorizing Provider  albuterol (PROVENTIL HFA;VENTOLIN HFA) 108 (90 Base) MCG/ACT inhaler Inhale 1-2 puffs into the lungs every 6 (six) hours as needed for wheezing or shortness of breath. 01/07/16   Charlott Rakes, MD  albuterol (PROVENTIL) (2.5 MG/3ML) 0.083% nebulizer solution Take 3 mLs (2.5 mg total) by nebulization every 2 (two) hours as needed for wheezing or shortness of breath. 05/24/17   Lenore Cordia, MD  budesonide-formoterol (SYMBICORT) 160-4.5 MCG/ACT inhaler Inhale 2 puffs into the lungs 2 (two) times daily. 08/04/17   Parrett, Fonnie Mu, NP  chlorpheniramine-HYDROcodone (TUSSIONEX) 10-8 MG/5ML SUER Take 5 mLs by mouth every 12 (twelve) hours. Patient taking differently: Take 5 mLs by mouth every 12 (twelve) hours as needed for cough.  05/23/17   Ophelia Shoulder, MD  cyclobenzaprine (FLEXERIL) 10 MG tablet Take 1 tablet (10 mg total) by mouth 2 (two) times daily as needed for muscle spasms. 06/25/17   Charlann Lange, PA-C  diclofenac sodium (VOLTAREN) 1 % GEL Apply 2 g topically 4 (four) times daily. Patient taking differently: Apply 2 g topically 4 (four) times daily as needed (pain).  06/14/17   Alphonzo Grieve, MD  gabapentin (NEURONTIN) 300 MG capsule Take 1 capsule (300 mg total) by mouth 2 (two)  times daily. 01/07/16   Charlott Rakes, MD  glipiZIDE (GLUCOTROL) 5 MG tablet Take 1 tablet (5 mg total) by mouth every morning. 07/22/17   Tawny Asal, MD  guaiFENesin (MUCINEX) 600 MG 12 hr tablet Take 600 mg by mouth 2 (two) times daily as needed for cough.     [provider]  guaiFENesin-codeine 100-10 MG/5ML syrup Take 5 mLs by mouth every 6 (six) hours as needed for cough. 08/02/17   Tawny Asal, MD  loratadine (CLARITIN) 10 MG tablet Take 1 tablet (10 mg total) by mouth daily. 05/24/17   Ophelia Shoulder, MD  losartan (COZAAR) 50 MG tablet Take  1 tablet (50 mg total) by mouth daily. 08/02/17   Tawny Asal, MD  metFORMIN (GLUCOPHAGE) 1000 MG tablet Take 1 tablet (1,000 mg total) by mouth 2 (two) times daily with a meal. See discharge instructions for how to take medicine at first 08/02/17 08/02/18  Tawny Asal, MD  omeprazole (PRILOSEC) 20 MG capsule Take 2 capsules (40 mg total) by mouth daily. 06/14/17   Alphonzo Grieve, MD  phenol (CHLORASEPTIC) 1.4 % LIQD Use as directed 2 sprays in the mouth or throat 3 (three) times daily. 08/02/17   Tawny Asal, MD  polyethylene glycol [facility]) packet Take 17 g by mouth daily as needed for mild constipation.     [provider]  predniSONE (DELTASONE) 10 MG tablet 4 tabs daily  for 3  days,  then stop. 08/04/17   Parrett, Fonnie Mu, NP  sertraline (ZOLOFT) 50 MG tablet Take 75 mg by mouth every morning.     [provider]      PHYSICAL EXAMINATION:   VITAL SIGNS: Blood pressure (!) 123/104, pulse (!) 131, temperature 98.1 F (36.7 C), temperature source Oral, resp. rate (!) 27, height 5' 1.5" (1.562 m), weight 90.3 kg, SpO2 97 %.  GENERAL:  53 y.o.-year-old patient lying in the bed with no acute distress.  EYES: Pupils equal, round, reactive to light and accommodation. No scleral icterus. Extraocular muscles intact.  HEENT: Head atraumatic, normocephalic. Oropharynx and nasopharynx clear.  NECK:  Supple, no jugular venous distention. No thyroid enlargement, no tenderness.  LUNGS: Normal breath sounds bilaterally, b/l wheezing, no crepitation. No use of accessory muscles of respiration.  CARDIOVASCULAR: S1, S2 normal. No murmurs, rubs, or gallops.  ABDOMEN: Soft, nontender, nondistended. Bowel sounds present. No organomegaly or mass.  EXTREMITIES: No pedal edema, cyanosis, or clubbing.  NEUROLOGIC: Cranial nerves II through XII are intact. Muscle strength 5/5 in all extremities. Sensation intact. Gait not checked.  PSYCHIATRIC: The patient is alert and oriented x 3.   SKIN: No obvious rash, lesion, or ulcer.   LABORATORY PANEL:   CBC Recent Labs  Lab 01/26/19 1156  WBC 6.6  HGB 16.0*  HCT 49.8*  PLT 241  MCV 87.2  MCH 28.0  MCHC 32.1  RDW 14.2  LYMPHSABS 1.5  MONOABS 0.8  EOSABS 0.0  BASOSABS 0.0   ------------------------------------------------------------------------------------------------------------------  Chemistries  Recent Labs  Lab 01/26/19 1156  NA 131*  K 3.7  CL 95*  CO2 21*  GLUCOSE 386*  BUN 37*  CREATININE 2.66*  CALCIUM 9.1  AST 45*  ALT 37  ALKPHOS 106  BILITOT 0.9   ------------------------------------------------------------------------------------------------------------------ estimated creatinine clearance is 25.6 mL/min (A) (by C-G formula based on SCr of 2.66 mg/dL (H)). ------------------------------------------------------------------------------------------------------------------ No results for input(s): TSH, T4TOTAL, T3FREE, THYROIDAB in the last 72 hours.  Invalid input(s): FREET3   Coagulation profile No results for input(s):  INR, PROTIME in the last 168 hours. ------------------------------------------------------------------------------------------------------------------- No results for input(s): DDIMER in the last 72 hours. -------------------------------------------------------------------------------------------------------------------  Cardiac Enzymes Recent Labs  Lab 01/26/19 1156  TROPONINI <0.03   ------------------------------------------------------------------------------------------------------------------ Invalid input(s): POCBNP  ---------------------------------------------------------------------------------------------------------------  Urinalysis    Component Value Date/Time   COLORURINE YELLOW 07/31/2017 0630   APPEARANCEUR CLEAR 07/31/2017 0630   LABSPEC 1.036 (H) 07/31/2017 0630   PHURINE 5.0 07/31/2017 0630   GLUCOSEU >=500 (A) 07/31/2017 0630   HGBUR  NEGATIVE 07/31/2017 0630   BILIRUBINUR NEGATIVE 07/31/2017 0630   KETONESUR NEGATIVE 07/31/2017 0630   PROTEINUR NEGATIVE 07/31/2017 0630   UROBILINOGEN 0.2 04/09/2015 1657   NITRITE NEGATIVE 07/31/2017 0630   LEUKOCYTESUR NEGATIVE 07/31/2017 0630     RADIOLOGY: Dg Chest Port 1 View  Result Date: 01/26/2019 CLINICAL DATA:  Fever.  Productive cough.  Wheezing.  Dyspnea. EXAM: PORTABLE CHEST 1 VIEW COMPARISON:  07/30/2017 FINDINGS: The heart size and mediastinal contours are within normal limits. Both lungs are clear. The visualized skeletal structures are unremarkable. IMPRESSION: Normal exam. Electronically Signed   By: Lorriane Shire M.D.   On: 01/26/2019 12:37    EKG: Orders placed or performed during the hospital encounter of 01/26/19  . ED EKG  . ED EKG  . ED EKG  . ED EKG    IMPRESSION AND PLAN:  * Ac renal failure   Dehydration, decreased intake   IV fluids, monitor   Avoid nephrotoxic meds  * Influenza A   Tamiflu  * Asthma exacerbation   IV and inhaled steroids,.   Duoneb,     * Htn  hold  meds due to renal fialure  * DM   Hold oral meds, keep on ISS  All the records are reviewed and case discussed with ED provider. Management plans discussed with the patient, family and they are in agreement.  CODE STATUS: Full. Code Status History    Date Active Date Inactive Code Status Order ID Comments User Context   07/30/2017 2004 08/02/2017 1731 Full Code [id number]  Collier Salina, MD ED   07/20/2017 1513 07/22/2017 2139 Full Code [id number]  Shela Leff, MD ED   06/12/2017 2034 06/14/2017 2301 Full Code [id number]  Jule Ser, DO Inpatient   05/22/2017 1743 05/24/2017 2048 Full Code [id number]  Riccardo Dubin, MD Inpatient   09/28/2015 2210 10/02/2015 2044 Full Code [id number]  Reubin Milan, MD Inpatient       TOTAL TIME TAKING CARE OF THIS PATIENT: 45 minutes.    Vaughan Basta M.D on 01/26/2019   Between 7am to 6pm - Pager -  [phone]  After 6pm go to [email or web address] - password [email or web address] [facility]  Office  [phone]  CC: Primary care physician; Patient, No Pcp Per   Note: This dictation was prepared with Dragon dictation along with smaller phrase technology. Any transcriptional errors that result from this process are unintentional.

## 2019-01-26 NOTE — ED Notes (Signed)
Spoke with Dr Jannifer Franklin about CBG of 420. Received verbal orders to give 15 units insulin IV and then she was cleared to go up to 1C.

## 2019-01-26 NOTE — ED Provider Notes (Addendum)
[facility] Emergency Department Provider Note       Time seen: ----------------------------------------- 12:14 PM on 01/26/2019 -----------------------------------------   I have reviewed the triage vital signs and the nursing notes.  HISTORY   Chief Complaint Cough and Generalized Body Aches    HPI [name] is a 53 y.o. female with a history of asthma, depression, hypertension, diabetes, marijuana use who presents to the ED for generalized body aches with fever, productive cough since Sunday.  Patient has recently take ibuprofen.  She is also complaining of low back pain and poor urinary output.  She was found to be diaphoretic and dyspneic on arrival.  Past Medical History:  Diagnosis Date  . Asthma   . Depression   . Hypertension     Patient Active Problem List   Diagnosis Date Noted  . Asthma exacerbation 07/20/2017  . Marijuana use 06/12/2017  . Acute pain of right knee 10/14/2016  . Diabetic neuropathy ([place]) 01/07/2016  . Plantar fasciitis of left foot 11/26/2015  . Back pain 10/10/2015  . Asthma 09/29/2015  . Essential hypertension 09/29/2015  . Diabetes mellitus ([place]) 09/29/2015    Past Surgical History:  Procedure Laterality Date  . ABDOMINAL HYSTERECTOMY    . TUBAL LIGATION      Allergies Patient has no known allergies.  Social History Social History   Tobacco Use  . Smoking status: Former Smoker    Packs/day: 3.00    Years: 10.00    Pack years: 30.00    Types: Cigarettes    Last attempt to quit: 12/22/2004    Years since quitting: 14.1  . Smokeless tobacco: Never Used  Substance Use Topics  . Alcohol use: No  . Drug use: No   Review of Systems Constitutional: Positive for fevers, chills, aches Cardiovascular: Negative for chest pain. Respiratory: Positive for shortness of breath and cough Gastrointestinal: Negative for abdominal pain, vomiting and diarrhea. Genitourinary: Positive for  oliguria Musculoskeletal: Positive for low back pain Skin: Negative for rash. Neurological: Negative for headaches, positive for weakness  All systems negative/normal/unremarkable except as stated in the HPI  ____________________________________________   PHYSICAL EXAM:  VITAL SIGNS: ED Triage Vitals  Enc Vitals Group     BP 01/26/19 1149 (!) 148/95     Pulse Rate 01/26/19 1149 (!) 129     Resp 01/26/19 1149 20     Temp 01/26/19 1149 98.1 F (36.7 C)     Temp Source 01/26/19 1149 Oral     SpO2 01/26/19 1149 98 %     Weight 01/26/19 1151 199 lb (90.3 kg)     Height 01/26/19 1151 5' 1.5" (1.562 m)     Head Circumference --      Peak Flow --      Pain Score 01/26/19 1150 8     Pain Loc --      Pain Edu? --      Excl. in [place]? --    Constitutional: Alert and oriented.  Mild to moderate distress Eyes: Conjunctivae are normal. Normal extraocular movements. ENT      Head: Normocephalic and atraumatic.      Nose: No congestion/rhinnorhea.      Mouth/Throat: Mucous membranes are moist.      Neck: No stridor. Cardiovascular: Rapid rate, regular rhythm. No murmurs, rubs, or gallops. Respiratory: Tachypnea with diffuse wheezing and rhonchi bilaterally Gastrointestinal: Soft and nontender. Normal bowel sounds Musculoskeletal: Nontender with normal range of motion in extremities. No lower extremity tenderness nor edema.  Neurologic:  Normal speech and language. No gross focal neurologic deficits are appreciated.  Skin: Profuse diaphoresis is noted Psychiatric: Anxious ____________________________________________  EKG: Interpreted by me.  Sinus tachycardia with a rate of 137 bpm, normal PR interval, normal QRS, normal QT  ____________________________________________  CRITICAL CARE Performed by: Laurence Aly   Total critical care time: 30 minutes  Critical care time was exclusive of separately billable procedures and treating other patients.  Critical care was  necessary to treat or prevent imminent or life-threatening deterioration.  Critical care was time spent personally by me on the following activities: development of treatment plan with patient and/or surrogate as well as nursing, discussions with consultants, evaluation of patient's response to treatment, examination of patient, obtaining history from patient or surrogate, ordering and performing treatments and interventions, ordering and review of laboratory studies, ordering and review of radiographic studies, pulse oximetry and re-evaluation of patient's condition.   ED COURSE:  As part of my medical decision making, I reviewed the following data within the [place] History obtained from family if available, nursing notes, old chart and ekg, as well as notes from prior ED visits. Patient presented for dyspnea, we will assess with labs and imaging as indicated at this time. Clinical Course as of Jan 27 1324  Wed Jan 26, 2019  1314 Influenza A By PCR(!): POSITIVE [JW]  1318 Creatinine(!): 2.66 [JW]    Clinical Course User Index [JW] Earleen Newport, MD   Procedures ____________________________________________   LABS (pertinent positives/negatives)  Labs Reviewed  CBC WITH DIFFERENTIAL/PLATELET - Abnormal; Notable for the following components:      Result Value   RBC 5.71 (*)    Hemoglobin 16.0 (*)    HCT 49.8 (*)    All other components within normal limits  COMPREHENSIVE METABOLIC PANEL - Abnormal; Notable for the following components:   Sodium 131 (*)    Chloride 95 (*)    CO2 21 (*)    Glucose, Bld 386 (*)    BUN 37 (*)    Creatinine, Ser 2.66 (*)    Total Protein 8.3 (*)    AST 45 (*)    GFR calc non Af Amer 20 (*)    GFR calc Af Amer 23 (*)    All other components within normal limits  BLOOD GAS, VENOUS - Abnormal; Notable for the following components:   pH, Ven 7.46 (*)    pCO2, Ven 25 (*)    pO2, Ven 71.0 (*)    Bicarbonate 17.8 (*)     Acid-base deficit 4.3 (*)    All other components within normal limits  INFLUENZA PANEL BY PCR (TYPE A & B) - Abnormal; Notable for the following components:   Influenza A By PCR POSITIVE (*)    All other components within normal limits  BRAIN NATRIURETIC PEPTIDE  TROPONIN I   CRITICAL CARE Performed by: Laurence Aly   Total critical care time: 30 minutes  Critical care time was exclusive of separately billable procedures and treating other patients.  Critical care was necessary to treat or prevent imminent or life-threatening deterioration.  Critical care was time spent personally by me on the following activities: development of treatment plan with patient and/or surrogate as well as nursing, discussions with consultants, evaluation of patient's response to treatment, examination of patient, obtaining history from patient or surrogate, ordering and performing treatments and interventions, ordering and review of laboratory studies, ordering and review of radiographic studies, pulse oximetry and  re-evaluation of patient's condition.  RADIOLOGY Images were viewed by me  Chest x-ray IMPRESSION: Normal exam. ____________________________________________   DIFFERENTIAL DIAGNOSIS   CHF, COPD, asthma, pneumonia, influenza, renal failure  FINAL ASSESSMENT AND PLAN  Acute respiratory distress, acute asthma exacerbation, influenza A, acute renal failure   Plan: The patient had presented for respiratory distress. Patient's labs did indicate acute renal insufficiency with a creatinine of 2.6 and a BUN of 37.  She was given IV fluids.  She does have longstanding diabetes which is likely poorly controlled.  Her CBC was unremarkable, she did test positive for influenza.  She may have had an anxiety component to her asthma exacerbation. Patient's imaging did not reveal any pneumonia.  I will discuss with the hospitalist for admission.   Laurence Aly, MD    Note: This note  was generated in part or whole with voice recognition software. Voice recognition is usually quite accurate but there are transcription errors that can and very often do occur. I apologize for any typographical errors that were not detected and corrected.     Earleen Newport, MD 01/26/19 1334    Earleen Newport, MD 01/26/19 [phone]

## 2019-01-26 NOTE — ED Notes (Signed)
ED TO INPATIENT HANDOFF REPORT  Name/Age/Gender [name] 53 y.o. female  Code Status Code Status History    Date Active Date Inactive Code Status Order ID Comments User Context   07/30/2017 2004 08/02/2017 1731 Full Code [id number]  Collier Salina, MD ED   07/20/2017 1513 07/22/2017 2139 Full Code [id number]  Shela Leff, MD ED   06/12/2017 2034 06/14/2017 2301 Full Code [id number]  Jule Ser, DO Inpatient   05/22/2017 1743 05/24/2017 2048 Full Code [id number]  Riccardo Dubin, MD Inpatient   09/28/2015 2210 10/02/2015 2044 Full Code [id number]  Reubin Milan, MD Inpatient      Home/SNF/Other Home  Chief Complaint flu like symptoms  Level of Care/Admitting Diagnosis ED Disposition    ED Disposition Condition [place]: [place] [100120]  Level of Care: Med-Surg [16]  Diagnosis: Acute renal failure (ARF) [facility]) [[id number]]  Admitting Physician: Vaughan Basta [phone]  Attending Physician: Vaughan Basta [phone]  Estimated length of stay: past midnight tomorrow  Certification:: I certify this patient will need inpatient services for at least 2 midnights  PT Class (Do Not Modify): Inpatient [101]  PT Acc Code (Do Not Modify): Private [1]       Medical History Past Medical History:  Diagnosis Date  . Asthma   . Depression   . Hypertension     Allergies No Known Allergies  IV Location/Drains/Wounds Patient Lines/Drains/Airways Status   Active Line/Drains/Airways    Name:   Placement date:   Placement time:   Site:   Days:   Peripheral IV 01/26/19 Left Wrist   01/26/19    1221    Wrist   less than 1   Peripheral IV 01/26/19 Right Forearm   01/26/19    1327    Forearm   less than 1          Labs/Imaging Results for orders placed or performed during the hospital encounter of 01/26/19 (from the past 48 hour(s))  CBC with Differential     Status: Abnormal   Collection Time: 01/26/19 11:56  AM  Result Value Ref Range   WBC 6.6 4.0 - 10.5 K/uL   RBC 5.71 (H) 3.87 - 5.11 MIL/uL   Hemoglobin 16.0 (H) 12.0 - 15.0 g/dL   HCT 49.8 (H) 36.0 - 46.0 %   MCV 87.2 80.0 - 100.0 fL   MCH 28.0 26.0 - 34.0 pg   MCHC 32.1 30.0 - 36.0 g/dL   RDW 14.2 11.5 - 15.5 %   Platelets 241 150 - 400 K/uL   nRBC 0.0 0.0 - 0.2 %   Neutrophils Relative % 66 %   Neutro Abs 4.3 1.7 - 7.7 K/uL   Lymphocytes Relative 22 %   Lymphs Abs 1.5 0.7 - 4.0 K/uL   Monocytes Relative 12 %   Monocytes Absolute 0.8 0.1 - 1.0 K/uL   Eosinophils Relative 0 %   Eosinophils Absolute 0.0 0.0 - 0.5 K/uL   Basophils Relative 0 %   Basophils Absolute 0.0 0.0 - 0.1 K/uL   Immature Granulocytes 0 %   Abs Immature Granulocytes 0.02 0.00 - 0.07 K/uL    Comment: Performed at [facility], [address] [place], [place] [address]  Comprehensive metabolic panel     Status: Abnormal   Collection Time: 01/26/19 11:56 AM  Result Value Ref Range   Sodium 131 (L) 135 - 145 mmol/L   Potassium 3.7 3.5 - 5.1 mmol/L   Chloride  95 (L) 98 - 111 mmol/L   CO2 21 (L) 22 - 32 mmol/L   Glucose, Bld 386 (H) 70 - 99 mg/dL   BUN 37 (H) 6 - 20 mg/dL   Creatinine, Ser 2.66 (H) 0.44 - 1.00 mg/dL   Calcium 9.1 8.9 - 10.3 mg/dL   Total Protein 8.3 (H) 6.5 - 8.1 g/dL   Albumin 4.3 3.5 - 5.0 g/dL   AST 45 (H) 15 - 41 U/L   ALT 37 0 - 44 U/L   Alkaline Phosphatase 106 38 - 126 U/L   Total Bilirubin 0.9 0.3 - 1.2 mg/dL   GFR calc non Af Amer 20 (L) >60 mL/min   GFR calc Af Amer 23 (L) >60 mL/min   Anion gap 15 5 - 15    Comment: Performed at [facility], [address] [place], [place] [address]  Brain natriuretic peptide     Status: None   Collection Time: 01/26/19 11:56 AM  Result Value Ref Range   B Natriuretic Peptide 39.0 0.0 - 100.0 pg/mL    Comment: Performed at [facility], [address] [place], [place] [address]  Troponin I - Once     Status: None   Collection Time: 01/26/19 11:56 AM  Result  Value Ref Range   Troponin I <0.03 <0.03 ng/mL    Comment: Performed at [facility], [address] [place], [place] [address]  Blood gas, venous     Status: Abnormal   Collection Time: 01/26/19 12:20 PM  Result Value Ref Range   pH, Ven 7.46 (H) 7.250 - 7.430   pCO2, Ven 25 (L) 44.0 - 60.0 mmHg   pO2, Ven 71.0 (H) 32.0 - 45.0 mmHg   Bicarbonate 17.8 (L) 20.0 - 28.0 mmol/L   Acid-base deficit 4.3 (H) 0.0 - 2.0 mmol/L   O2 Saturation 95.0 %   Patient temperature 37.0    Collection site VEIN    Sample type VEIN     Comment: Performed at [facility], [address], [place], [place] [address]  Influenza panel by PCR (type A & B)     Status: Abnormal   Collection Time: 01/26/19 12:20 PM  Result Value Ref Range   Influenza A By PCR POSITIVE (A) NEGATIVE   Influenza B By PCR NEGATIVE NEGATIVE    Comment: (NOTE) The Xpert Xpress Flu assay is intended as an aid in the diagnosis of  influenza and should not be used as a sole basis for treatment.  This  assay is FDA approved for nasopharyngeal swab specimens only. Nasal  washings and aspirates are unacceptable for Xpert Xpress Flu testing. Performed at [facility], [place], [place] [address]   Glucose, capillary     Status: Abnormal   Collection Time: 01/26/19  5:20 PM  Result Value Ref Range   Glucose-Capillary 538 (HH) 70 - 99 mg/dL   Comment 1 Document in Chart   Glucose, capillary     Status: Abnormal   Collection Time: 01/26/19  8:19 PM  Result Value Ref Range   Glucose-Capillary 540 (HH) 70 - 99 mg/dL   Comment 1 Call MD NNP PA CNM   Glucose, capillary     Status: Abnormal   Collection Time: 01/26/19 10:29 PM  Result Value Ref Range   Glucose-Capillary 420 (H) 70 - 99 mg/dL   Dg Chest Port 1 View  Result Date: 01/26/2019 CLINICAL DATA:  Fever.  Productive cough.  Wheezing.  Dyspnea.  EXAM: PORTABLE CHEST 1 VIEW COMPARISON:  07/30/2017 FINDINGS: The heart size and mediastinal  contours are within normal limits. Both lungs are clear. The visualized skeletal structures are unremarkable. IMPRESSION: Normal exam. Electronically Signed   By: Lorriane Shire M.D.   On: 01/26/2019 12:37    Pending Labs [employer] (From admission, onward)    Start     Ordered   Signed and Held  HIV antibody (Routine Testing)  Once,   R     Signed and Held   Signed and Occupational hygienist morning,   R     Signed and Held   Signed and Held  CBC  Tomorrow morning,   R     Signed and Held   Signed and Held  CBC  (heparin)  Once,   R    Comments:  Baseline for heparin therapy IF NOT ALREADY DRAWN.  Notify MD if PLT < 100 K.    Signed and Held   Signed and Held  Creatinine, serum  (heparin)  Once,   R    Comments:  Baseline for heparin therapy IF NOT ALREADY DRAWN.    Signed and Held          Vitals/Pain Today's Vitals   01/26/19 2000 01/26/19 2030 01/26/19 2200 01/26/19 2230  BP: (!) 145/92 (!) 144/91 (!) 139/96 (!) 137/97  Pulse: (!) 111 (!) 117 (!) 105 (!) 102  Resp: (!) 31 15 (!) 33 17  Temp:      TempSrc:      SpO2: 97% 97% 98% 98%  Weight:      Height:      PainSc:    0-No pain    Isolation Precautions No active isolations  Medications Medications  ipratropium-albuterol (DUONEB) 0.5-2.5 (3) MG/3ML nebulizer solution (  Not Given 01/26/19 1500)  budesonide (PULMICORT) nebulizer solution 0.5 mg (0.5 mg Nebulization Given 01/26/19 1611)  methylPREDNISolone sodium succinate (SOLU-MEDROL) 125 mg/2 mL injection 60 mg (60 mg Intravenous Given 01/26/19 1613)  0.9 %  sodium chloride infusion ( Intravenous Stopped 01/26/19 2111)  oseltamivir (TAMIFLU) capsule 30 mg (has no administration in time range)  insulin aspart (novoLOG) injection 0-20 Units (has no administration in time range)  guaiFENesin-dextromethorphan (ROBITUSSIN DM) 100-10 MG/5ML syrup 5 mL (has no administration in time range)  benzonatate (TESSALON) capsule 200 mg (200 mg Oral Given 01/26/19  2220)  acetaminophen (TYLENOL) tablet 650 mg (has no administration in time range)  levalbuterol (XOPENEX) nebulizer solution 1.25 mg (has no administration in time range)  insulin regular (NOVOLIN R,HUMULIN R) 100 units/mL injection 15 Units (has no administration in time range)  ipratropium-albuterol (DUONEB) 0.5-2.5 (3) MG/3ML nebulizer solution 3 mL (3 mLs Nebulization Given 01/26/19 1215)  ipratropium-albuterol (DUONEB) 0.5-2.5 (3) MG/3ML nebulizer solution 3 mL (3 mLs Nebulization Given 01/26/19 1215)  ipratropium-albuterol (DUONEB) 0.5-2.5 (3) MG/3ML nebulizer solution 3 mL (3 mLs Nebulization Given 01/26/19 1215)  methylPREDNISolone sodium succinate (SOLU-MEDROL) 125 mg/2 mL injection 125 mg (125 mg Intravenous Given 01/26/19 1239)  0.9 %  sodium chloride infusion ( Intravenous Stopped 01/26/19 1451)  oseltamivir (TAMIFLU) capsule 75 mg (75 mg Oral Given 01/26/19 1325)  morphine 4 MG/ML injection 4 mg (4 mg Intravenous Given 01/26/19 1351)  insulin aspart (novoLOG) injection 15 Units (15 Units Subcutaneous Given 01/26/19 1819)  sodium chloride 0.9 % bolus 1,000 mL (0 mLs Intravenous Stopped 01/26/19 2215)  insulin aspart (novoLOG) injection 15 Units (15 Units Subcutaneous Given 01/26/19 2112)  insulin aspart (novoLOG)  injection 15 Units (15 Units Intravenous Given 01/26/19 2247)    Mobility walks

## 2019-01-26 NOTE — ED Notes (Signed)
Report given to Tom RN.

## 2019-01-26 NOTE — ED Notes (Signed)
Pt assisted to the restroom. Pt provided with another drink upon request. Resting comfortably at this time. No other needs voiced.

## 2019-01-26 NOTE — ED Notes (Signed)
Held prednisone 60mg  due to elevated CBG.

## 2019-01-27 LAB — HEMOGLOBIN A1C
Hgb A1c MFr Bld: 10.7 % — ABNORMAL HIGH (ref 4.8–5.6)
Mean Plasma Glucose: 260.39 mg/dL

## 2019-01-27 LAB — BASIC METABOLIC PANEL
Anion gap: 10 (ref 5–15)
BUN: 29 mg/dL — ABNORMAL HIGH (ref 6–20)
CHLORIDE: 105 mmol/L (ref 98–111)
CO2: 20 mmol/L — AB (ref 22–32)
Calcium: 8.7 mg/dL — ABNORMAL LOW (ref 8.9–10.3)
Creatinine, Ser: 1.52 mg/dL — ABNORMAL HIGH (ref 0.44–1.00)
GFR calc Af Amer: 45 mL/min — ABNORMAL LOW (ref >60)
GFR calc non Af Amer: 39 mL/min — ABNORMAL LOW (ref >60)
Glucose, Bld: 241 mg/dL — ABNORMAL HIGH (ref 70–99)
Potassium: 4.2 mmol/L (ref 3.5–5.1)
SODIUM: 135 mmol/L (ref 135–145)

## 2019-01-27 LAB — CBC
HCT: 39.5 % (ref 36.0–46.0)
Hemoglobin: 12.6 g/dL (ref 12.0–15.0)
MCH: 27.9 pg (ref 26.0–34.0)
MCHC: 31.9 g/dL (ref 30.0–36.0)
MCV: 87.4 fL (ref 80.0–100.0)
NRBC: 0 % (ref 0.0–0.2)
Platelets: 219 10*3/uL (ref 150–400)
RBC: 4.52 MIL/uL (ref 3.87–5.11)
RDW: 14 % (ref 11.5–15.5)
WBC: 4.6 10*3/uL (ref 4.0–10.5)

## 2019-01-27 LAB — GLUCOSE, CAPILLARY
GLUCOSE-CAPILLARY: 288 mg/dL — AB (ref 70–99)
Glucose-Capillary: 203 mg/dL — ABNORMAL HIGH (ref 70–99)
Glucose-Capillary: 401 mg/dL — ABNORMAL HIGH (ref 70–99)
Glucose-Capillary: 236 mg/dL — ABNORMAL HIGH (ref 70–99)
Glucose-Capillary: 60 mg/dL — ABNORMAL LOW (ref 70–99)
Glucose-Capillary: 142 mg/dL — ABNORMAL HIGH (ref 70–99)

## 2019-01-27 LAB — MRSA PCR SCREENING: MRSA by PCR: NEGATIVE

## 2019-01-27 MED ORDER — OSELTAMIVIR PHOSPHATE 30 MG PO CAPS
30.0000 mg | ORAL_CAPSULE | Freq: Two times a day (BID) | ORAL | Status: DC
  Administered 2019-01-27 – 2019-01-28 (×3): 30 mg via ORAL
  Filled 2019-01-27 (×4): qty 1

## 2019-01-27 MED ORDER — INSULIN ASPART 100 UNIT/ML ~~LOC~~ SOLN: 0.0000 [IU] | Freq: Every day | SUBCUTANEOUS | Status: DC

## 2019-01-27 MED ORDER — INSULIN ASPART 100 UNIT/ML ~~LOC~~ SOLN
0.0000 [IU] | Freq: Three times a day (TID) | SUBCUTANEOUS | Status: DC
  Administered 2019-01-27: 17:00:00 11 [IU] via SUBCUTANEOUS
  Administered 2019-01-28: 7 [IU] via SUBCUTANEOUS
  Filled 2019-01-27 (×2): qty 1

## 2019-01-27 MED ORDER — INSULIN ASPART 100 UNIT/ML ~~LOC~~ SOLN
25.0000 [IU] | Freq: Once | SUBCUTANEOUS | Status: AC
  Administered 2019-01-27: 25 [IU] via SUBCUTANEOUS
  Filled 2019-01-27: qty 1

## 2019-01-27 MED ORDER — INSULIN ASPART 100 UNIT/ML ~~LOC~~ SOLN
6.0000 [IU] | Freq: Three times a day (TID) | SUBCUTANEOUS | Status: DC
  Administered 2019-01-27 – 2019-01-28 (×2): 6 [IU] via SUBCUTANEOUS
  Filled 2019-01-27 (×2): qty 1

## 2019-01-27 MED ORDER — INSULIN DETEMIR 100 UNIT/ML ~~LOC~~ SOLN
12.0000 [IU] | Freq: Two times a day (BID) | SUBCUTANEOUS | Status: DC
  Administered 2019-01-27 – 2019-01-28 (×3): 12 [IU] via SUBCUTANEOUS
  Filled 2019-01-27 (×4): qty 0.12

## 2019-01-27 NOTE — Discharge Summary (Addendum)
[facility] at [place] NAME: [name]    MR#:  [id number]  DATE OF BIRTH:  July 06, 1966  DATE OF ADMISSION:  01/26/2019   ADMITTING PHYSICIAN: Vaughan Basta, MD  DATE OF DISCHARGE: 01/28/2019  PRIMARY CARE PHYSICIAN: Patient, No Pcp Per   ADMISSION DIAGNOSIS:  Influenza [J11.1] Acute renal failure, unspecified acute renal failure type ([place]) [N17.9] Asthma with acute exacerbation, unspecified asthma severity, unspecified whether persistent [J45.901] DISCHARGE DIAGNOSIS:  Active Problems:   Influenza A   Acute renal failure (ARF) ([place])  SECONDARY DIAGNOSIS:   Past Medical History:  Diagnosis Date  . Asthma   . Depression   . Hypertension    HOSPITAL COURSE:   RobinJohnsonis a53 y.o.femalewith a known history of hypertension, asthma, diabetes, and depression who presented with fever, cough, and shortness of breath for 3 days. Associated with poor appetite and myalgias. Presented for "asthma flare". Noted to have influenza A and acute kidney injury, pre-renal. Admitted for rehydration to improve renal function, and management of asthma exacerbation secondary to flu. Per patient she had not been seeing a primary care doctor or taking her home medications recently.   PLAN:  * Acute renal failure Likely due to dehydration, decreased oral fluid intake. IV fluids received when admitted, now on oral hydration.   Monitored BMET: creatinine improved to 1.38 01/28/19 from 2.66 on admission. Avoid nephrotoxic meds. Patient will need a follow-up BMET at her upcoming PCP visit to continue to monitor improvement.  * Influenza A Continue Tamiflu 30 mg twice daily for 5 days total. Dose adjusted for renal function per pharmacy recommendations.    Last dose Sunday 01/30/2019 PM.   * Asthma exacerbation IV and inhaled steroids while admitted. Duoneb. Continue home inhaler and nebulizer regimen and follow-up with PCP for  chronic asthma management.  * Hypertension held meds due to acute renal failure. Restart with improvement at discharge. Follow up with PCP as above.  * DM Held oral meds (on metformin and glipizide at home), on ISS while admitted with blood glucose ranging from 60-540. A1c 10.7% while admitted.  Pharmacy and Diabetes Coordinator consults placed during admission for insulin dosing. Acute hyperglycemia likely secondary to recent steroid use. Restart home medications at discharge with close PCP follow-up.  * Depression Continue home medications (sertraline), PCP follow-up.   DISCHARGE CONDITIONS:  stable CONSULTS OBTAINED:   DRUG ALLERGIES:  No Known Allergies DISCHARGE MEDICATIONS:   Allergies as of 01/28/2019   No Known Allergies     Medication List    STOP taking these medications   chlorpheniramine-HYDROcodone 10-8 MG/5ML Suer Commonly known as:  TUSSIONEX   phenol 1.4 % Liqd Commonly known as:  CHLORASEPTIC   predniSONE 10 MG tablet Commonly known as:  DELTASONE     TAKE these medications   albuterol 108 (90 Base) MCG/ACT inhaler Commonly known as:  PROVENTIL HFA;VENTOLIN HFA Inhale 1-2 puffs into the lungs every 6 (six) hours as needed for wheezing or shortness of breath.   albuterol (2.5 MG/3ML) 0.083% nebulizer solution Commonly known as:  PROVENTIL Take 3 mLs (2.5 mg total) by nebulization every 2 (two) hours as needed for wheezing or shortness of breath.   budesonide-formoterol 160-4.5 MCG/ACT inhaler Commonly known as:  SYMBICORT Inhale 2 puffs into the lungs 2 (two) times daily.   cyclobenzaprine 10 MG tablet Commonly known as:  FLEXERIL Take 1 tablet (10 mg total) by mouth 2 (two) times daily as needed for muscle spasms.  diclofenac sodium 1 % Gel Commonly known as:  VOLTAREN Apply 2 g topically 4 (four) times daily. What changed:    when to take this  reasons to take this   gabapentin 300 MG capsule Commonly known as:  NEURONTIN Take 1  capsule (300 mg total) by mouth 2 (two) times daily.   glipiZIDE 5 MG tablet Commonly known as:  GLUCOTROL Take 1 tablet (5 mg total) by mouth every morning.   guaiFENesin 600 MG 12 hr tablet Commonly known as:  MUCINEX Take 600 mg by mouth 2 (two) times daily as needed for cough.   guaiFENesin-codeine 100-10 MG/5ML syrup Take 5 mLs by mouth every 6 (six) hours as needed for cough.   loratadine 10 MG tablet Commonly known as:  CLARITIN Take 1 tablet (10 mg total) by mouth daily.   losartan 50 MG tablet Commonly known as:  COZAAR Take 1 tablet (50 mg total) by mouth daily.   metFORMIN 1000 MG tablet Commonly known as:  GLUCOPHAGE Take 1 tablet (1,000 mg total) by mouth 2 (two) times daily with a meal. See discharge instructions for how to take medicine at first   omeprazole 20 MG capsule Commonly known as:  PRILOSEC Take 2 capsules (40 mg total) by mouth daily.   oseltamivir 30 MG capsule Commonly known as:  TAMIFLU Take 1 capsule (30 mg total) by mouth 2 (two) times daily for 5 doses.   polyethylene glycol packet Commonly known as:  MIRALAX / GLYCOLAX Take 17 g by mouth daily as needed for mild constipation.   sertraline 50 MG tablet Commonly known as:  ZOLOFT Take 75 mg by mouth every morning.        DISCHARGE INSTRUCTIONS:   DIET:  Cardiac diet, diabetic diet DISCHARGE CONDITION:  Stable ACTIVITY:  Activity as tolerated OXYGEN:  Home Oxygen: No.  Oxygen Delivery: room air DISCHARGE LOCATION:  home   If you experience worsening of your admission symptoms, develop shortness of breath, life threatening emergency, suicidal or homicidal thoughts you must seek medical attention immediately by calling 911 or calling your MD immediately if your symptoms are severe.  You Must read complete instructions/literature along with all the possible adverse reactions/side effects for all the medicines you take and that have been prescribed to you. Take any new medicines  only after you have completely understood and accept all the possible adverse reactions/side effects.   Please note  You were cared for by a hospitalist during your hospital stay. If you have any questions about your discharge medications or the care you received while you were in the hospital after you are discharged, you can call the unit and asked to speak with the hospitalist on call if the hospitalist that took care of you is not available. Once you are discharged, your primary care physician will handle any further medical issues. Please note that NO REFILLS for any discharge medications will be authorized once you are discharged, as it is imperative that you return to your primary care physician (or establish a relationship with a primary care physician if you do not have one) for your aftercare needs so that they can reassess your need for medications and monitor your lab values.    On the day of Discharge:  VITAL SIGNS:  Blood pressure (!) 146/86, pulse 88, temperature 97.6 F (36.4 C), temperature source Oral, resp. rate 20, height 5\' 1"  (1.549 m), weight 84.2 kg, SpO2 97 %. PHYSICAL EXAMINATION:  GENERAL:53 y.o.-year-old patient lying in the  bed with no acute distress.  EYES: Pupils equal, round, reactive to light and accommodation. No scleral icterus. Extraocular muscles intact.  HEENT: Head atraumatic, normocephalic. Oropharynx and nasopharynx clear.  NECK: Supple, no jugular venous distention. No thyroid enlargement, no tenderness.  LUNGS: Normal breath sounds bilaterally,no wheezing today, nocrepitation. No use of accessory muscles of respiration.  CARDIOVASCULAR: S1, S2 normal. No murmurs, rubs, or gallops.  ABDOMEN: Soft, nontender, nondistended. Bowel sounds present. No organomegaly or mass.  EXTREMITIES: No pedal edema, cyanosis, or clubbing.  NEUROLOGIC: Cranial nerves II through XII are intact. Muscle strength 5/5 in all extremities. Sensation intact. Gait not checked.    PSYCHIATRIC: The patient is alert and oriented x 3.  SKIN: No obvious rash, lesion, or ulcer.  DATA REVIEW:   CBC Recent Labs  Lab 01/28/19 0456  WBC 9.8  HGB 13.1  HCT 40.1  PLT 234    Chemistries  Recent Labs  Lab 01/26/19 1156  01/28/19 0456  NA 131*   < > 135  K 3.7   < > 4.1  CL 95*   < > 105  CO2 21*   < > 21*  GLUCOSE 386*   < > 243*  BUN 37*   < > 37*  CREATININE 2.66*   < > 1.38*  CALCIUM 9.1   < > 9.0  AST 45*  --   --   ALT 37  --   --   ALKPHOS 106  --   --   BILITOT 0.9  --   --    < > = values in this interval not displayed.   RADIOLOGY:  Dg Chest Port 1 View  Result Date: 01/26/2019 CLINICAL DATA:  Fever.  Productive cough.  Wheezing.  Dyspnea. EXAM: PORTABLE CHEST 1 VIEW COMPARISON:  07/30/2017 FINDINGS: The heart size and mediastinal contours are within normal limits. Both lungs are clear. The visualized skeletal structures are unremarkable. IMPRESSION: Normal exam. Electronically Signed   By: Lorriane Shire M.D.   On: 01/26/2019 12:37   Management plans discussed with the patient and they are in agreement.  CODE STATUS: Full Code   TOTAL TIME TAKING CARE OF THIS PATIENT: 45 minutes.   Ripley Fraise PA-C on 01/28/2019 at 9:46 AM  Between 7am to 6pm - Pager - [phone]  After 6pm go to [email or web address] - Proofreader  [facility]  Office  [phone]  CC: Primary care physician; Patient, No Pcp Per

## 2019-01-27 NOTE — Progress Notes (Signed)
Inpatient Diabetes Program Recommendations  AACE/ADA: New Consensus Statement on Inpatient Glycemic Control (2015)  Target Ranges:  Prepandial:   less than 140 mg/dL      Peak postprandial:   less than 180 mg/dL (1-2 hours)      Critically ill patients:  140 - 180 mg/dL   Lab Results  Component Value Date   GLUCAP 401 (H) 01/27/2019   HGBA1C 9.0 (H) 07/20/2017    Review of Glycemic Control Results for JOSHLYN, BEADLE (MRN [id number]) as of 01/27/2019 12:00  Ref. Range 01/26/2019 23:08 01/26/2019 23:53 01/27/2019 03:42 01/27/2019 07:50 01/27/2019 11:50  Glucose-Capillary Latest Ref Range: 70 - 99 mg/dL 351 (H) 236 (H) 203 (H) 288 (H) 401 (H)   Diabetes history: DM 2 Outpatient Diabetes medications:  Glucotrol 5 mg daily, Metformin 1000 mg bid Current orders for Inpatient glycemic control:  Novolog resistant tid with meals and HS Solumedrol 60 mg IV q 6 hours Inpatient Diabetes Program Recommendations:    Referral received.   -While on steroids, consider adding Levemir 12 units bid -Add Novolog meal coverage 6 units tid with meals -Reduce current bedtime scale to HS scale per the glycemic control order set -Add A1C to labs Will follow.  Thanks,  Adah Perl, RN, BC-ADM Inpatient Diabetes Coordinator Pager [phone] (8a-5p)

## 2019-01-27 NOTE — Progress Notes (Addendum)
[place] [facility] [place] NAME: [name]    MR#:  [id number]  DATE OF BIRTH:  Sep 28, 1966  SUBJECTIVE:  CHIEF COMPLAINT:   Chief Complaint  Patient presents with  . Cough  . Generalized Body Aches   Shortness of breath, cough, fever, myalgias, appetite all improved today. Taking oral fluids. Explains that she had not been taking any of her medications for a while after switching doctors, used to see someone at [employer] [facility].  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  REVIEW OF SYSTEMS:  CONSTITUTIONAL: No fever, fatigue or weakness.  EYES: No blurred or double vision.  EARS, NOSE, AND THROAT: No tinnitus or ear pain.  RESPIRATORY: Mild cough, no shortness of breath, wheezing ,no hemoptysis.  CARDIOVASCULAR: No chest pain, orthopnea, edema.  GASTROINTESTINAL: No nausea, vomiting, diarrhea or abdominal pain.  GENITOURINARY: No dysuria, hematuria.  ENDOCRINE: No polyuria, nocturia,  HEMATOLOGY: No anemia, easy bruising or bleeding SKIN: No rash or lesion. MUSCULOSKELETAL: No joint pain or arthritis.   NEUROLOGIC: No tingling, numbness, weakness.  PSYCHIATRY: No anxiety or depression.  DRUG ALLERGIES:  No Known Allergies VITALS:  Blood pressure (!) 155/103, pulse 79, temperature 97.9 F (36.6 C), temperature source Oral, resp. rate (!) 23, height 5\' 1"  (1.549 m), weight 84.2 kg, SpO2 98 %. PHYSICAL EXAMINATION:  Vital signs reviewed.  GENERAL:  53 y.o.-year-old patient lying in the bed with no acute distress.  EYES: Pupils equal, round, reactive to light and accommodation. No scleral icterus. Extraocular muscles intact.  HEENT: Head atraumatic, normocephalic. Oropharynx and nasopharynx clear.  NECK:  Supple, no jugular venous distention. No thyroid enlargement, no tenderness.  LUNGS: Normal breath sounds bilaterally, no wheezing today, no crepitation. No use of accessory muscles of  respiration.  CARDIOVASCULAR: S1, S2 normal. No murmurs, rubs, or gallops.  ABDOMEN: Soft, nontender, nondistended. Bowel sounds present. No organomegaly or mass.  EXTREMITIES: No pedal edema, cyanosis, or clubbing.  NEUROLOGIC: Cranial nerves II through XII are intact. Muscle strength 5/5 in all extremities. Sensation intact. Gait not checked.  PSYCHIATRIC: The patient is alert and oriented x 3.  SKIN: No obvious rash, lesion, or ulcer.   LABORATORY PANEL:  Female CBC Recent Labs  Lab 01/27/19 0417  WBC 4.6  HGB 12.6  HCT 39.5  PLT 219   ------------------------------------------------------------------------------------------------------------------ Chemistries  Recent Labs  Lab 01/26/19 1156 01/27/19 0417  NA 131* 135  K 3.7 4.2  CL 95* 105  CO2 21* 20*  GLUCOSE 386* 241*  BUN 37* 29*  CREATININE 2.66* 1.52*  CALCIUM 9.1 8.7*  AST 45*  --   ALT 37  --   ALKPHOS 106  --   BILITOT 0.9  --    RADIOLOGY:  Dg Chest Port 1 View  Result Date: 01/26/2019 CLINICAL DATA:  Fever.  Productive cough.  Wheezing.  Dyspnea. EXAM: PORTABLE CHEST 1 VIEW COMPARISON:  07/30/2017 FINDINGS: The heart size and mediastinal contours are within normal limits. Both lungs are clear. The visualized skeletal structures are unremarkable. IMPRESSION: Normal exam. Electronically Signed   By: Lorriane Shire M.D.   On: 01/26/2019 12:37   ASSESSMENT AND PLAN:   [name]  is a 53 y.o. female with a known history of hypertension, asthma, and diabetes, who presented with fever, cough, and shortness of breath for 3 days. Poor appetite and myalgias. Presented for "asthma flare". Noted to have influenza and acute kidney injury. Admitted  for rehydration to improve renal function, and asthma and flu management.   PLAN:  * Acute renal failure   Dehydration, decreased intake. Education provided.   IV fluids when admitted, now oral hydration encouraged.   Monitor BMET tomorrow morning for improvement.    Avoid nephrotoxic meds.  * Influenza A   Tamiflu 30 mg for 5 days total. Dose adjusted for renal function per pharmacy   * Asthma exacerbation   IV and inhaled steroids.   Duoneb.    * Hypertension  hold  meds due to acute renal failure. Restart with improvement at discharge.  * DM  Hold oral meds (on metformin at home), keep on ISS. Restart home medications at discharge. - BG consistently above goal today, c/s to DM RN placed, pharmacy following for insulin dosing   All the records are reviewed and case is discussed with Care Management/Social Worker. Management plans discussed with the patient and they are in agreement.  CODE STATUS: Full Code  TOTAL TIME TAKING CARE OF THIS PATIENT: 30 minutes.   More than 50% of the time was spent in counseling/coordination of care: YES  POSSIBLE D/C IN 1 DAYS, DEPENDING ON CLINICAL CONDITION; renal function.  English Tomer PA-C on 01/27/2019 at 11:29 AM  Between 7am to 6pm - Pager - [phone]  After 6 pm go to [email or web address] - Proofreader  [facility]  Office  [phone]  CC: Primary care physician; Patient, No Pcp Per  Will need to establish care at discharge.  Note: This dictation was prepared with Dragon dictation along with smaller phrase technology. Any transcriptional errors that result from this process are unintentional.

## 2019-01-27 NOTE — Progress Notes (Signed)
PHARMACY NOTE:  ANTIMICROBIAL RENAL DOSAGE ADJUSTMENT  Current antimicrobial regimen includes a mismatch between antimicrobial dosage and estimated renal function.  As per policy approved by the Pharmacy & Therapeutics and Medical Executive Committees, the antimicrobial dosage will be adjusted accordingly.  Current antimicrobial dosage:  Tamiflu 30mg  qd  Indication: Influenza  Renal Function:  Estimated Creatinine Clearance: 42.6 mL/min (A) (by C-G formula based on SCr of 1.52 mg/dL (H)).    Antimicrobial dosage has been changed to:  Tamiflu 30mg  bid  Additional comments:  Pharmacy will continue to monitor and adjust per renal function  Thank you for allowing pharmacy to be a part of this patient's care.  Lu Duffel, PharmD, BCPS Clinical Pharmacist 01/27/2019 7:51 AM

## 2019-01-28 LAB — CBC
HCT: 40.1 % (ref 36.0–46.0)
Hemoglobin: 13.1 g/dL (ref 12.0–15.0)
MCH: 28.2 pg (ref 26.0–34.0)
MCHC: 32.7 g/dL (ref 30.0–36.0)
MCV: 86.2 fL (ref 80.0–100.0)
PLATELETS: 234 10*3/uL (ref 150–400)
RBC: 4.65 MIL/uL (ref 3.87–5.11)
RDW: 13.6 % (ref 11.5–15.5)
WBC: 9.8 10*3/uL (ref 4.0–10.5)
nRBC: 0 % (ref 0.0–0.2)

## 2019-01-28 LAB — BASIC METABOLIC PANEL
Anion gap: 9 (ref 5–15)
BUN: 37 mg/dL — ABNORMAL HIGH (ref 6–20)
CALCIUM: 9 mg/dL (ref 8.9–10.3)
CO2: 21 mmol/L — ABNORMAL LOW (ref 22–32)
CREATININE: 1.38 mg/dL — AB (ref 0.44–1.00)
Chloride: 105 mmol/L (ref 98–111)
GFR calc non Af Amer: 44 mL/min — ABNORMAL LOW (ref >60)
GFR, EST AFRICAN AMERICAN: 51 mL/min — AB (ref >60)
Glucose, Bld: 243 mg/dL — ABNORMAL HIGH (ref 70–99)
Potassium: 4.1 mmol/L (ref 3.5–5.1)
Sodium: 135 mmol/L (ref 135–145)

## 2019-01-28 LAB — GLUCOSE, CAPILLARY: Glucose-Capillary: 227 mg/dL — ABNORMAL HIGH (ref 70–99)

## 2019-01-28 LAB — HIV ANTIBODY (ROUTINE TESTING W REFLEX): HIV Screen 4th Generation wRfx: NONREACTIVE

## 2019-01-28 MED ORDER — ALBUTEROL SULFATE HFA 108 (90 BASE) MCG/ACT IN AERS: 1.0000 | INHALATION_SPRAY | Freq: Four times a day (QID) | RESPIRATORY_TRACT | 0 refills | Status: DC | PRN

## 2019-01-28 MED ORDER — LIVING WELL WITH DIABETES BOOK
Freq: Once | Status: AC
  Administered 2019-01-28: 11:00:00
  Filled 2019-01-28: qty 1

## 2019-01-28 MED ORDER — GABAPENTIN 300 MG PO CAPS: 300.0000 mg | ORAL_CAPSULE | Freq: Two times a day (BID) | ORAL | 0 refills | Status: DC

## 2019-01-28 MED ORDER — POLYETHYLENE GLYCOL 3350 17 G PO PACK: 17.0000 g | PACK | Freq: Every day | ORAL | 0 refills | Status: AC | PRN

## 2019-01-28 MED ORDER — METFORMIN HCL 1000 MG PO TABS: 1000.0000 mg | ORAL_TABLET | Freq: Two times a day (BID) | ORAL | 1 refills | Status: DC

## 2019-01-28 MED ORDER — GUAIFENESIN ER 600 MG PO TB12: 600.0000 mg | ORAL_TABLET | Freq: Two times a day (BID) | ORAL | 0 refills | Status: DC | PRN

## 2019-01-28 MED ORDER — CYCLOBENZAPRINE HCL 10 MG PO TABS: 10.0000 mg | ORAL_TABLET | Freq: Two times a day (BID) | ORAL | 0 refills | Status: DC | PRN

## 2019-01-28 MED ORDER — SERTRALINE HCL 50 MG PO TABS: 75.0000 mg | ORAL_TABLET | ORAL | 0 refills | Status: AC

## 2019-01-28 MED ORDER — LORATADINE 10 MG PO TABS: 10.0000 mg | ORAL_TABLET | Freq: Every day | ORAL | 0 refills | Status: AC

## 2019-01-28 MED ORDER — ALBUTEROL SULFATE (2.5 MG/3ML) 0.083% IN NEBU: 2.5000 mg | INHALATION_SOLUTION | RESPIRATORY_TRACT | 0 refills | Status: AC | PRN

## 2019-01-28 MED ORDER — GLIPIZIDE 5 MG PO TABS: 5.0000 mg | ORAL_TABLET | Freq: Every morning | ORAL | 0 refills | Status: DC

## 2019-01-28 MED ORDER — LOSARTAN POTASSIUM 50 MG PO TABS: 50.0000 mg | ORAL_TABLET | Freq: Every day | ORAL | 1 refills | Status: DC

## 2019-01-28 MED ORDER — BUDESONIDE-FORMOTEROL FUMARATE 160-4.5 MCG/ACT IN AERO: 2.0000 | INHALATION_SPRAY | Freq: Two times a day (BID) | RESPIRATORY_TRACT | 0 refills | Status: DC

## 2019-01-28 MED ORDER — OSELTAMIVIR PHOSPHATE 30 MG PO CAPS: 30.0000 mg | ORAL_CAPSULE | Freq: Two times a day (BID) | ORAL | 0 refills | Status: AC

## 2019-01-28 MED ORDER — DICLOFENAC SODIUM 1 % TD GEL: 2.0000 g | Freq: Four times a day (QID) | TRANSDERMAL | 0 refills | Status: AC | PRN

## 2019-01-28 MED ORDER — OMEPRAZOLE 20 MG PO CPDR: 40.0000 mg | DELAYED_RELEASE_CAPSULE | Freq: Every day | ORAL | 0 refills | Status: DC

## 2019-01-28 NOTE — Progress Notes (Signed)
Pt transitioning care home. Discharge instructions and DM book provided and reviewed with PT. Paper work for group home home provided to pt. All prescription needs addressed. All questions addressed at time of discharge.

## 2019-01-28 NOTE — Progress Notes (Signed)
Inpatient Diabetes Program Recommendations  AACE/ADA: New Consensus Statement on Inpatient Glycemic Control   Target Ranges:  Prepandial:   less than 140 mg/dL      Peak postprandial:   less than 180 mg/dL (1-2 hours)      Critically ill patients:  140 - 180 mg/dL  Results for [name], [name] (MRN [id number]) as of 01/28/2019 07:25  Ref. Range 01/28/2019 04:56  Glucose Latest Ref Range: 70 - 99 mg/dL 243 (H)   Results for [name], [name] (MRN [id number]) as of 01/28/2019 07:25  Ref. Range 01/27/2019 07:50 01/27/2019 11:50 01/27/2019 16:42 01/27/2019 21:17 01/27/2019 23:02  Glucose-Capillary Latest Ref Range: 70 - 99 mg/dL 288 (H)  Novolog 11 units 401 (H)  Novolog 25 units  Levemir 12 units 236 (H)  Novolog 17 units 60 (L) 142 (H)  Levemir 12 units   Results for [name], [name] (MRN [id number]) as of 01/28/2019 07:25  Ref. Range 07/20/2017 10:52 01/27/2019 04:17  Hemoglobin A1C Latest Ref Range: 4.8 - 5.6 % 9.0 (H) 10.7 (H)   Review of Glycemic Control  Diabetes history: DM2 Outpatient Diabetes medications: Metformin 1000 mg BID, Glipizide 5 mg daily (No DM meds in over 9 months) Current orders for Inpatient glycemic control: Levemir 12 units BID, Novolog 6 units TID with meals, Novolog 0-20 units TID with meals, Novolog 0-5 units QHS; Solumedrol 60 mg Q6H  Inpatient Diabetes Program Recommendations:   Insulin - Basal: If steroids are continued as ordered, please consider increasing Levemir to 15 units BID.  HgbA1C: A1C 10.7% on 01/27/19 indicating an average glucose of 260 mg/dl over the past 2-3 months. Per H&P, patient has not been taking DM medications and per chart, does not have a PCP nor insurance. Please provide Rx for DM medications at time of discharge.   NOTE: Spoke with patient over the phone about diabetes and home regimen for diabetes control. Patient reports that she has not seen a provider in over 9 months.  Patient does not have a PCP nor an insurance. Patient states she was taking  Glipizide 5 mg daily and Metformin 1000 mg BID as an outpatient for diabetes control in the past (none in over 9 months). Patient reports that she does NOT have a glucometer so she has not been checking glucose.  Informed patient it would be requested that CM provider her with a glucometer and testing supplies if they have any available. Otherwise, encouraged patient to go to [facility] and purchase Reli-[employer] Prime glucometer for $9 and a box of 50 test strips for $9.  Inquired about prior A1C and patient reports that she does not know what an A1C value is. Explained what an A1C is and discussed A1C results (10.7% on 01/27/19) and explained that current A1C indicates an average glucose of 209 mg/dl over the past 2-3 months. Discussed glucose and A1C goals. Discussed importance of checking CBGs and maintaining good CBG control to prevent long-term and short-term complications. Explained how hyperglycemia leads to damage within blood vessels which lead to the common complications seen with uncontrolled diabetes. Stressed to the patient the importance of improving glycemic control to prevent further complications from uncontrolled diabetes. Discussed impact of nutrition, exercise, stress, sickness, and medications on diabetes control.Explained to the patient that currently she is receiving steroids which are contributing to noted hyperglycemia. Discussed [facility] and informed patient a CM consult would be ordered for assistance with follow up and medication needs. Patient states that she wants  to take better care of herself and she is hopeful about doing so if she can be connected with appropriate resources in the community.  Patient verbalized understanding of information discussed and she states that she has no further questions at this time related to diabetes.  Thanks, Barnie Alderman, RN, MSN, CDE Diabetes Coordinator Inpatient Diabetes Program [phone] (Team Pager from 8am  to 5pm)

## 2019-01-28 NOTE — NC FL2 (Signed)
[place] LEVEL OF CARE SCREENING TOOL     IDENTIFICATION  Patient Name: [name] Birthdate: [birth year] Sex: female Admission Date (Current Location): 01/26/2019  [place] and [place] Number:  Engineering geologist and Address:  [facility], [address], [place], [place] [address]      Provider Number: [id number]  Attending Physician Name and Address:  Sela Hua, MD  Relative Name and Phone Number:       Current Level of Care: Hospital Recommended Level of Care: Other (Comment)(Group Home ) Prior Approval Number:    Date Approved/Denied:   PASRR Number:    Discharge Plan: Other (Comment)(Group Home )    Current Diagnoses: Patient Active Problem List   Diagnosis Date Noted  . Influenza A 01/26/2019  . Acute renal failure (ARF) ([place]) 01/26/2019  . Marijuana use 06/12/2017  . Acute pain of right knee 10/14/2016  . Diabetic neuropathy ([place]) 01/07/2016  . Plantar fasciitis of left foot 11/26/2015  . Back pain 10/10/2015  . Asthma 09/29/2015  . Essential hypertension 09/29/2015  . Diabetes mellitus ([place]) 09/29/2015    Orientation RESPIRATION BLADDER Height & Weight     Self, Time, Place  Normal Continent Weight: 185 lb 10 oz (84.2 kg) Height:  5\' 1"  (154.9 cm)  BEHAVIORAL SYMPTOMS/MOOD NEUROLOGICAL BOWEL NUTRITION STATUS  (none) (none) Continent Diet(Heart Healthy )  AMBULATORY STATUS COMMUNICATION OF NEEDS Skin   Supervision Verbally Normal                       Personal Care Assistance Level of Assistance  Bathing, Feeding, Dressing Bathing Assistance: Independent Feeding assistance: Independent Dressing Assistance: Independent     Functional Limitations Info  Sight, Hearing, Speech Sight Info: Adequate Hearing Info: Adequate Speech Info: Adequate    SPECIAL CARE FACTORS FREQUENCY                       Contractures Contractures Info: Not present    Additional Factors Info  Code  Status, Allergies Code Status Info: Full Code  Allergies Info: NKA           Current Medications (01/28/2019):  This is the current hospital active medication list Current Facility-Administered Medications  Medication Dose Route Frequency Provider Last Rate Last Dose  . 0.9 %  sodium chloride infusion   Intravenous Continuous Vaughan Basta, MD   Stopped at 01/26/19 2111  . acetaminophen (TYLENOL) tablet 650 mg  650 mg Oral Q6H PRN Lance Coon, MD      . albuterol (PROVENTIL) (2.5 MG/3ML) 0.083% nebulizer solution 3 mL  3 mL Inhalation Q6H PRN Vaughan Basta, MD      . benzonatate (TESSALON) capsule 200 mg  200 mg Oral TID PRN Lance Coon, MD   200 mg at 01/26/19 2220  . budesonide (PULMICORT) nebulizer solution 0.5 mg  0.5 mg Nebulization BID Vaughan Basta, MD   0.5 mg at 01/28/19 0752  . cyclobenzaprine (FLEXERIL) tablet 10 mg  10 mg Oral BID PRN Vaughan Basta, MD      . docusate sodium (COLACE) capsule 100 mg  100 mg Oral BID PRN Vaughan Basta, MD      . gabapentin (NEURONTIN) capsule 300 mg  300 mg Oral BID Vaughan Basta, MD   300 mg at 01/28/19 0806  . guaiFENesin (MUCINEX) 12 hr tablet 600 mg  600 mg Oral BID PRN Vaughan Basta, MD   600 mg at 01/28/19 1126  .  guaiFENesin-codeine 100-10 MG/5ML solution 5 mL  5 mL Oral Q6H PRN Vaughan Basta, MD   5 mL at 01/27/19 2316  . guaiFENesin-dextromethorphan (ROBITUSSIN DM) 100-10 MG/5ML syrup 5 mL  5 mL Oral Q4H PRN Lance Coon, MD      . heparin injection 5,000 Units  5,000 Units Subcutaneous Q8H Vaughan Basta, MD   5,000 Units at 01/28/19 [phone]  . insulin aspart (novoLOG) injection 0-20 Units  0-20 Units Subcutaneous TID WC Max Sane, MD   7 Units at 01/28/19 0807  . insulin aspart (novoLOG) injection 0-5 Units  0-5 Units Subcutaneous QHS Manuella Ghazi, Vipul, MD      . insulin aspart (novoLOG) injection 6 Units  6 Units Subcutaneous TID WC Max Sane, MD   6 Units at  01/28/19 0806  . insulin detemir (LEVEMIR) injection 12 Units  12 Units Subcutaneous BID Max Sane, MD   12 Units at 01/28/19 1125  . levalbuterol (XOPENEX) nebulizer solution 1.25 mg  1.25 mg Nebulization Q6H PRN Lance Coon, MD      . loratadine (CLARITIN) tablet 10 mg  10 mg Oral Daily Vaughan Basta, MD   10 mg at 01/28/19 0806  . methylPREDNISolone sodium succinate (SOLU-MEDROL) 125 mg/2 mL injection 60 mg  60 mg Intravenous Q6H Vaughan Basta, MD   60 mg at 01/28/19 0808  . oseltamivir (TAMIFLU) capsule 30 mg  30 mg Oral BID Lu Duffel, RPH   30 mg at 01/28/19 1125  . pantoprazole (PROTONIX) EC tablet 40 mg  40 mg Oral Daily Vaughan Basta, MD   40 mg at 01/28/19 0806  . polyethylene glycol (MIRALAX / GLYCOLAX) packet 17 g  17 g Oral Daily PRN Vaughan Basta, MD      . sertraline (ZOLOFT) tablet 75 mg  75 mg Oral Martin Majestic, MD   75 mg at 01/28/19 3536     Discharge Medications: Please see discharge summary for a list of discharge medications.  Relevant Imaging Results:  Relevant Lab Results:   Additional Information    Shelma Eiben  Louretta Shorten, LCSWA

## 2019-01-28 NOTE — Clinical Social Work Note (Signed)
CSW notified by [facility] that patient lives at [facility] in [place]. CSW contacted patient's "supervisor" [name] [phone] to verify. Per [name], patient lives at the group home "because she has no where to stay". Per [name], patient needs FL2. CSW complete FL2 and also spoke with Elaina Hoops [phone] at [facility]. Barrie Lyme states that she is not familiar with that group home and will look into this to make sure patient is receiving what she needs. Patient states that she will drive herself home and her car is in the parking lot.   [place], [place]

## 2019-01-28 NOTE — Discharge Instructions (Signed)
You were hospitalized with an acute worsening of your chronic asthma and an infection with influenza (also known as the flu).   You were started on a medication called Tamiflu to help shorten the duration and decrease the symptoms of the flu, please continue taking Tamiflu 30 mg by mouth twice daily until your final dose on Sunday night (01/30/2019). Take one pill tonight at dinner time today since we gave you one this morning, then take one in the morning and one at night on 2/8 and on 2/9.   Continue to use your incentive spirometer at home.  While hospitalized, you were found to have an acute kidney injury, due to dehydration. This improved as you took in fluids but you will need to follow up with a primary doctor outpatient to get blood drawn to monitor your kidney function. Continue to stay hydrated at home.   Continue your home medications for blood pressure, asthma, and diabetes management. Your A1c was 10.7% when we measured it in the hospital, which is high.   Please follow-up from the hospital and establish care with a primary doctor at [facility] within 5 days. We have made an appointment for you.

## 2019-01-28 NOTE — Care Management Note (Addendum)
Case Management Note  Patient Details  Name: [name] MRN: [id number] Date of Birth: 1966-04-04  Subjective/Objective:  Admitted to [facility] with the diagnosis of asthma,. Flu+. States she lives at [place] [facility] [place] [place]. Supervisor at [employer] is Roselind Rily [phone]).  Daughter is Lawrence Santiago [phone]). Friend is [employer] [phone]). States that the last doctor she seen was nine months ago in [facility]. States she quit taking her medications. "I take my medications, but don't take them."        Ms. Bridgeforth's telephone number is [phone]. States she drives and takes care of all basic activities of daily living herself.      Action/Plan: No insurance listed.  Application for [facility] given, . Referral sent to [facility] at [facility]. Prescriptions faxed to Medication Management   Expected Discharge Date:  01/28/19               Expected Discharge Plan:     In-House Referral:   yes  Discharge planning Services   yes  Post Acute Care Choice:    Choice offered to:     DME Arranged:    DME Agency:     HH Arranged:    HH Agency:     Status of Service:     If discussed at [employer], dates discussed:    Additional Comments:  Shelbie Ammons, RN MSN CCM Care Management [phone] 01/28/2019, 10:48 AM

## 2019-04-21 ENCOUNTER — Telehealth: Payer: Self-pay | Admitting: Pharmacy Technician

## 2019-04-21 NOTE — Telephone Encounter (Signed)
Patient failed to provide requested financial documentation. Financial documentation is required in order to determine patient's eligibility for [facility] program. No additional medication assistance will be provided until patient provides requested financial documentation. Patient notified by letter.  Velda Shell CPhT/Eligibility Specialist [facility]

## 2019-06-23 ENCOUNTER — Emergency Department: Payer: Self-pay

## 2019-06-23 ENCOUNTER — Emergency Department
Admission: EM | Admit: 2019-06-23 | Discharge: 2019-06-23 | Disposition: A | Discharge status: Home / Self Care | Payer: Self-pay | Attending: Emergency Medicine | Admitting: Emergency Medicine

## 2019-06-23 ENCOUNTER — Other Ambulatory Visit: Payer: Self-pay

## 2019-06-23 ENCOUNTER — Encounter: Payer: Self-pay | Admitting: Emergency Medicine

## 2019-06-23 DIAGNOSIS — M51369 Other intervertebral disc degeneration, lumbar region without mention of lumbar back pain or lower extremity pain: Secondary | ICD-10-CM

## 2019-06-23 DIAGNOSIS — M5134 Other intervertebral disc degeneration, thoracic region: Secondary | ICD-10-CM

## 2019-06-23 DIAGNOSIS — I10 Essential (primary) hypertension: Secondary | ICD-10-CM | POA: Insufficient documentation

## 2019-06-23 DIAGNOSIS — E119 Type 2 diabetes mellitus without complications: Secondary | ICD-10-CM | POA: Insufficient documentation

## 2019-06-23 DIAGNOSIS — J45909 Unspecified asthma, uncomplicated: Secondary | ICD-10-CM | POA: Insufficient documentation

## 2019-06-23 DIAGNOSIS — Z7984 Long term (current) use of oral hypoglycemic drugs: Secondary | ICD-10-CM | POA: Insufficient documentation

## 2019-06-23 DIAGNOSIS — Z79899 Other long term (current) drug therapy: Secondary | ICD-10-CM | POA: Insufficient documentation

## 2019-06-23 DIAGNOSIS — M5136 Other intervertebral disc degeneration, lumbar region: Secondary | ICD-10-CM | POA: Insufficient documentation

## 2019-06-23 DIAGNOSIS — M5126 Other intervertebral disc displacement, lumbar region: Secondary | ICD-10-CM

## 2019-06-23 DIAGNOSIS — Z87891 Personal history of nicotine dependence: Secondary | ICD-10-CM | POA: Insufficient documentation

## 2019-06-23 DIAGNOSIS — R339 Retention of urine, unspecified: Secondary | ICD-10-CM | POA: Insufficient documentation

## 2019-06-23 LAB — CBC WITH DIFFERENTIAL/PLATELET
Abs Immature Granulocytes: 0.02 10*3/uL (ref 0.00–0.07)
Basophils Absolute: 0 10*3/uL (ref 0.0–0.1)
Basophils Relative: 1 %
Eosinophils Absolute: 0.1 10*3/uL (ref 0.0–0.5)
Eosinophils Relative: 1 %
HCT: 43.6 % (ref 36.0–46.0)
Hemoglobin: 14.2 g/dL (ref 12.0–15.0)
Immature Granulocytes: 0 %
Lymphocytes Relative: 34 %
Lymphs Abs: 2.1 10*3/uL (ref 0.7–4.0)
MCH: 29.6 pg (ref 26.0–34.0)
MCHC: 32.6 g/dL (ref 30.0–36.0)
MCV: 90.8 fL (ref 80.0–100.0)
Monocytes Absolute: 0.6 10*3/uL (ref 0.1–1.0)
Monocytes Relative: 9 %
Neutro Abs: 3.3 10*3/uL (ref 1.7–7.7)
Neutrophils Relative %: 55 %
Platelets: 284 10*3/uL (ref 150–400)
RBC: 4.8 MIL/uL (ref 3.87–5.11)
RDW: 14 % (ref 11.5–15.5)
WBC: 6.1 10*3/uL (ref 4.0–10.5)
nRBC: 0 % (ref 0.0–0.2)

## 2019-06-23 LAB — BASIC METABOLIC PANEL
Anion gap: 11 (ref 5–15)
BUN: 14 mg/dL (ref 6–20)
CO2: 23 mmol/L (ref 22–32)
Calcium: 9.8 mg/dL (ref 8.9–10.3)
Chloride: 108 mmol/L (ref 98–111)
Creatinine, Ser: 1.09 mg/dL — ABNORMAL HIGH (ref 0.44–1.00)
GFR calc Af Amer: >60 mL/min (ref >60)
GFR calc non Af Amer: 58 mL/min — ABNORMAL LOW (ref >60)
Glucose, Bld: 213 mg/dL — ABNORMAL HIGH (ref 70–99)
Potassium: 3.2 mmol/L — ABNORMAL LOW (ref 3.5–5.1)
Sodium: 142 mmol/L (ref 135–145)

## 2019-06-23 LAB — URINALYSIS, COMPLETE (UACMP) WITH MICROSCOPIC
Bilirubin Urine: NEGATIVE
Glucose, UA: NEGATIVE mg/dL
Hgb urine dipstick: NEGATIVE
Ketones, ur: 5 mg/dL — AB
Leukocytes,Ua: NEGATIVE
Nitrite: NEGATIVE
Protein, ur: 30 mg/dL — AB
Specific Gravity, Urine: 1.017 (ref 1.005–1.030)
pH: 5 (ref 5.0–8.0)

## 2019-06-23 MED ORDER — PREDNISONE 20 MG PO TABS: 40.0000 mg | ORAL_TABLET | Freq: Every day | ORAL | 0 refills | Status: AC

## 2019-06-23 MED ORDER — HYDROMORPHONE HCL 1 MG/ML IJ SOLN
0.5000 mg | Freq: Once | INTRAMUSCULAR | Status: AC
  Administered 2019-06-23: 15:00:00 0.5 mg via INTRAVENOUS
  Filled 2019-06-23: qty 1

## 2019-06-23 MED ORDER — HYDROMORPHONE HCL 1 MG/ML IJ SOLN
1.0000 mg | Freq: Once | INTRAMUSCULAR | Status: AC
  Administered 2019-06-23: 11:00:00 1 mg via INTRAVENOUS
  Filled 2019-06-23: qty 1

## 2019-06-23 MED ORDER — HYDROMORPHONE HCL 1 MG/ML IJ SOLN: 1.0000 mg | Freq: Once | INTRAMUSCULAR | Status: DC

## 2019-06-23 MED ORDER — KETOROLAC TROMETHAMINE 30 MG/ML IJ SOLN
15.0000 mg | Freq: Once | INTRAMUSCULAR | Status: AC
  Administered 2019-06-23: 15 mg via INTRAVENOUS
  Filled 2019-06-23: qty 1

## 2019-06-23 MED ORDER — DEXAMETHASONE SODIUM PHOSPHATE 10 MG/ML IJ SOLN
10.0000 mg | Freq: Once | INTRAMUSCULAR | Status: AC
  Administered 2019-06-23: 11:00:00 10 mg via INTRAVENOUS
  Filled 2019-06-23: qty 1

## 2019-06-23 MED ORDER — KETOROLAC TROMETHAMINE 30 MG/ML IJ SOLN: 30.0000 mg | Freq: Once | INTRAMUSCULAR | Status: DC

## 2019-06-23 MED ORDER — HYDROCODONE-ACETAMINOPHEN 5-325 MG PO TABS: 1.0000 | ORAL_TABLET | ORAL | 0 refills | Status: DC | PRN

## 2019-06-23 MED ORDER — NAPROXEN 375 MG PO TABS: 375.0000 mg | ORAL_TABLET | Freq: Two times a day (BID) | ORAL | 0 refills | Status: AC

## 2019-06-23 MED ORDER — POTASSIUM CHLORIDE CRYS ER 20 MEQ PO TBCR
40.0000 meq | EXTENDED_RELEASE_TABLET | Freq: Once | ORAL | Status: AC
  Administered 2019-06-23: 15:00:00 40 meq via ORAL
  Filled 2019-06-23: qty 2

## 2019-06-23 NOTE — Consult Note (Signed)
Imaging reviewed. No large disc herniation to explain the patient's pain.  I discussed with Dr. Ellender Hose. Would put her on normal ER protocol for severe back pain (steroids, muscle relaxants, NSAIDs) and admit to medicine if needed for pain control.  No surgical intervention indicated.   Final radiology read pending. Above is my review of the T and L spine MRI, which does not reveal any substantial disc hernations.

## 2019-06-23 NOTE — ED Notes (Signed)
Bladder scan 60 ml; in and out cath performed for urine; approx 50 ml drained from patient's bladder.

## 2019-06-23 NOTE — ED Provider Notes (Signed)
[facility] Emergency Department Provider Note  ____________________________________________   First MD Initiated Contact with Patient 06/23/19 1015     (approximate)  I have reviewed the triage vital signs and the nursing notes.   HISTORY  Chief Complaint Back Pain and Urinary Retention    HPI [name] is a 53 y.o. female with past medical history as below here with severe back pain.  History is somewhat limited due to obvious distress on arrival and patient is tearful, screaming in pain.  She reports that over the last 3 weeks, she is had progressively worsening, now severe, 10 of 10, aching, throbbing, but intermittently stabbing midline lower back pain.  The pain radiates down the back of her left leg.  She developed associated difficulty walking as well as urinary retention over the past 24 hours.  She was being sent to orthopedics at [facility], when she was sent here for further evaluation.  She denies any loss of bowel continence.  No fevers or chills.  History of IV drug use or previous injuries to the area.  The pain that radiates in her leg is sharp, stabbing, and intermittently burning.  No right lower extremity symptoms.  No neck pain.  No other complaints.  Pain is worse than can be with palpation.  No alleviating factors.        Past Medical History:  Diagnosis Date   Asthma    Depression    Hypertension     Patient Active Problem List   Diagnosis Date Noted   Influenza A 01/26/2019   Acute renal failure (ARF) ([place]) 01/26/2019   Marijuana use 06/12/2017   Acute pain of right knee 10/14/2016   Diabetic neuropathy ([place]) 01/07/2016   Plantar fasciitis of left foot 11/26/2015   Back pain 10/10/2015   Asthma 09/29/2015   Essential hypertension 09/29/2015   Diabetes mellitus ([place]) 09/29/2015    Past Surgical History:  Procedure Laterality Date   ABDOMINAL HYSTERECTOMY     TUBAL LIGATION      Prior to  Admission medications   Medication Sig Start Date End Date Taking? Authorizing Provider  albuterol (PROVENTIL HFA;VENTOLIN HFA) 108 (90 Base) MCG/ACT inhaler Inhale 1-2 puffs into the lungs every 6 (six) hours as needed for wheezing or shortness of breath. 01/28/19  Yes Lule, Joana, PA  albuterol (PROVENTIL) (2.5 MG/3ML) 0.083% nebulizer solution Take 3 mLs (2.5 mg total) by nebulization every 2 (two) hours as needed for wheezing or shortness of breath. 01/28/19  Yes Lule, Joana, PA  atorvastatin (LIPITOR) 10 MG tablet Take 10 mg by mouth daily.   Yes [provider]  cyclobenzaprine (FLEXERIL) 10 MG tablet Take 1 tablet (10 mg total) by mouth 2 (two) times daily as needed for muscle spasms. 01/28/19  Yes Lule, Joana, PA  diclofenac sodium (VOLTAREN) 1 % GEL Apply 2 g topically 4 (four) times daily as needed (pain). 01/28/19  Yes Lule, Joana, PA  gabapentin (NEURONTIN) 300 MG capsule Take 1 capsule (300 mg total) by mouth 2 (two) times daily. 01/28/19  Yes Lule, Joana, PA  glipiZIDE (GLUCOTROL) 5 MG tablet Take 1 tablet (5 mg total) by mouth every morning. 01/28/19  Yes Lule, Joana, PA  loratadine (CLARITIN) 10 MG tablet Take 1 tablet (10 mg total) by mouth daily. 01/28/19  Yes Lule, Joana, PA  losartan (COZAAR) 50 MG tablet Take 1 tablet (50 mg total) by mouth daily. 01/28/19  Yes Lule, Joana, PA  metFORMIN (GLUCOPHAGE) 1000 MG tablet  Take 1 tablet (1,000 mg total) by mouth 2 (two) times daily with a meal. See discharge instructions for how to take medicine at first 01/28/19 06/23/19 Yes Lule, Joana, PA  omeprazole (PRILOSEC) 20 MG capsule Take 2 capsules (40 mg total) by mouth daily. 01/28/19  Yes Lule, Joana, PA  sertraline (ZOLOFT) 50 MG tablet Take 1.5 tablets (75 mg total) by mouth every morning. 01/28/19  Yes Lule, Joana, PA  HYDROcodone-acetaminophen (NORCO/VICODIN) 5-325 MG tablet Take 1-2 tablets by mouth every 4 (four) hours as needed for moderate pain or severe pain. 06/23/19 06/22/20  Duffy Bruce, MD    naproxen (NAPROSYN) 375 MG tablet Take 1 tablet (375 mg total) by mouth 2 (two) times daily with a meal for 7 days. 06/23/19 06/30/19  Duffy Bruce, MD  predniSONE (DELTASONE) 20 MG tablet Take 2 tablets (40 mg total) by mouth daily for 7 days. 06/23/19 06/30/19  Duffy Bruce, MD    Allergies Patient has no known allergies.  Family History  Problem Relation Age of Onset   Hypertension Mother     Social History Social History   Tobacco Use   Smoking status: Former Smoker    Packs/day: 3.00    Years: 10.00    Pack years: 30.00    Types: Cigarettes    Quit date: 12/22/2004    Years since quitting: 14.5   Smokeless tobacco: Never Used  Substance Use Topics   Alcohol use: No   Drug use: No    Review of Systems  Review of Systems  Constitutional: Negative for fatigue and fever.  HENT: Negative for congestion and sore throat.   Eyes: Negative for visual disturbance.  Respiratory: Negative for cough and shortness of breath.   Cardiovascular: Negative for chest pain.  Gastrointestinal: Negative for abdominal pain, diarrhea, nausea and vomiting.  Genitourinary: Negative for flank pain.  Musculoskeletal: Positive for back pain and gait problem. Negative for neck pain.  Skin: Negative for rash and wound.  Neurological: Negative for weakness.     ____________________________________________  PHYSICAL EXAM:      VITAL SIGNS: ED Triage Vitals  Enc Vitals Group     BP 06/23/19 1003 (!) 142/114     Pulse Rate 06/23/19 1003 67     Resp 06/23/19 1003 17     Temp 06/23/19 1003 99 F (37.2 C)     Temp Source 06/23/19 1003 Oral     SpO2 06/23/19 1003 90 %     Weight 06/23/19 1004 181 lb (82.1 kg)     Height 06/23/19 1004 5\' 1"  (1.549 m)     Head Circumference --      Peak Flow --      Pain Score 06/23/19 1003 10     Pain Loc --      Pain Edu? --      Excl. in [place]? --      Physical Exam Vitals signs and nursing note reviewed.  Constitutional:      General: She is in  acute distress (Tearful, yelling in pain).     Appearance: She is well-developed.  HENT:     Head: Normocephalic and atraumatic.  Eyes:     Conjunctiva/sclera: Conjunctivae normal.  Neck:     Musculoskeletal: Neck supple.  Cardiovascular:     Rate and Rhythm: Normal rate and regular rhythm.     Heart sounds: Normal heart sounds. No murmur. No friction rub.  Pulmonary:     Effort: Pulmonary effort is normal. No respiratory distress.  Breath sounds: Normal breath sounds. No wheezing or rales.  Abdominal:     General: There is no distension.     Palpations: Abdomen is soft.     Tenderness: There is no abdominal tenderness.     Comments: Soft, no overt tenderness, no rebound or guarding, no distention.  No pulsatile mass appreciated.  Skin:    General: Skin is warm.     Capillary Refill: Capillary refill takes less than 2 seconds.  Neurological:     Mental Status: She is alert and oriented to person, place, and time.     Motor: No abnormal muscle tone.      Spine Exam: Inspection/Palpation: Exquisite tenderness throughout the lower thoracic and lumbar spine, worse in the lower lumbar spine, particularly the left paraspinal area.  Positive straight leg raise on the left, with exquisite reproduction of pain. Strength: 5/5 throughout LE bilaterally (hip flexion/extension, adduction/abduction; knee flexion/extension; foot dorsiflexion/plantarflexion, inversion/eversion; great toe inversion) Sensation: Intact to light touch in proximal and distal LE bilaterally Reflexes: 2+ quadriceps and achilles reflexes    ____________________________________________   LABS (all labs ordered are listed, but only abnormal results are displayed)  Labs Reviewed  URINALYSIS, COMPLETE (UACMP) WITH MICROSCOPIC - Abnormal; Notable for the following components:      Result Value   Color, Urine YELLOW (*)    APPearance CLOUDY (*)    Ketones, ur 5 (*)    Protein, ur 30 (*)    Bacteria, UA RARE (*)     All other components within normal limits  BASIC METABOLIC PANEL - Abnormal; Notable for the following components:   Potassium 3.2 (*)    Glucose, Bld 213 (*)    Creatinine, Ser 1.09 (*)    GFR calc non Af Amer 58 (*)    All other components within normal limits  CBC WITH DIFFERENTIAL/PLATELET    ____________________________________________  EKG: None ________________________________________  RADIOLOGY All imaging, including plain films, CT scans, and ultrasounds, independently reviewed by me, and interpretations confirmed via formal radiology reads.  ED MD interpretation:   MRI degenerative disc disease, slight central herniation, no cauda equina  Official radiology report(s): Mr Thoracic Spine Wo Contrast  Result Date: 06/23/2019 CLINICAL DATA:  Thoracic back pain.  Low back pain. EXAM: MRI THORACIC AND LUMBAR SPINE WITHOUT CONTRAST TECHNIQUE: Multiplanar and multiecho pulse sequences of the thoracic and lumbar spine were obtained without intravenous contrast. COMPARISON:  None. FINDINGS: MRI THORACIC SPINE FINDINGS Alignment:  Normal Vertebrae: Negative for fracture or mass. Cord:  Normal signal and morphology Paraspinal and other soft tissues: Negative for paraspinous mass or soft tissue edema. No significant pleural effusion. Disc levels: Mild multilevel thoracic disc degeneration. Small central disc protrusions at T6-7, T7-8, T8-9. No significant spinal stenosis. MRI LUMBAR SPINE FINDINGS Segmentation:  Normal Alignment:  Normal Vertebrae:  Negative for fracture or mass. Conus medullaris and cauda equina: Conus extends to the L1-2 level. Conus and cauda equina appear normal. Paraspinal and other soft tissues: Negative for paraspinous mass or edema. Disc levels: L1-2: Negative L2-3: Negative L3-4: Small central disc protrusion without significant stenosis. L4-5: Disc degeneration with mild disc bulging and mild facet and ligamentum flavum hypertrophy. Mild subarticular stenosis  bilaterally. Spinal canal adequate in size L5-S1: Mild facet degeneration.  Negative for stenosis. IMPRESSION: MR THORACIC SPINE IMPRESSION Mild thoracic disc degeneration with small disc protrusions at T6-7, T7-8, T8-9. No significant stenosis MR LUMBAR SPINE IMPRESSION Mild thoracic degenerative changes. Small central disc protrusion at L3-4. No significant neural impingement.  Electronically Signed   By: Franchot Gallo M.D.   On: 06/23/2019 14:17   Mr Lumbar Spine Wo Contrast  Result Date: 06/23/2019 CLINICAL DATA:  Thoracic back pain.  Low back pain. EXAM: MRI THORACIC AND LUMBAR SPINE WITHOUT CONTRAST TECHNIQUE: Multiplanar and multiecho pulse sequences of the thoracic and lumbar spine were obtained without intravenous contrast. COMPARISON:  None. FINDINGS: MRI THORACIC SPINE FINDINGS Alignment:  Normal Vertebrae: Negative for fracture or mass. Cord:  Normal signal and morphology Paraspinal and other soft tissues: Negative for paraspinous mass or soft tissue edema. No significant pleural effusion. Disc levels: Mild multilevel thoracic disc degeneration. Small central disc protrusions at T6-7, T7-8, T8-9. No significant spinal stenosis. MRI LUMBAR SPINE FINDINGS Segmentation:  Normal Alignment:  Normal Vertebrae:  Negative for fracture or mass. Conus medullaris and cauda equina: Conus extends to the L1-2 level. Conus and cauda equina appear normal. Paraspinal and other soft tissues: Negative for paraspinous mass or edema. Disc levels: L1-2: Negative L2-3: Negative L3-4: Small central disc protrusion without significant stenosis. L4-5: Disc degeneration with mild disc bulging and mild facet and ligamentum flavum hypertrophy. Mild subarticular stenosis bilaterally. Spinal canal adequate in size L5-S1: Mild facet degeneration.  Negative for stenosis. IMPRESSION: MR THORACIC SPINE IMPRESSION Mild thoracic disc degeneration with small disc protrusions at T6-7, T7-8, T8-9. No significant stenosis MR LUMBAR SPINE  IMPRESSION Mild thoracic degenerative changes. Small central disc protrusion at L3-4. No significant neural impingement. Electronically Signed   By: Franchot Gallo M.D.   On: 06/23/2019 14:17    ____________________________________________  PROCEDURES   Procedure(s) performed (including Critical Care):  Procedures  ____________________________________________  INITIAL IMPRESSION / MDM / [place] / ED COURSE  As part of my medical decision making, I reviewed the following data within the electronic MEDICAL RECORD NUMBER Notes from prior ED visits and [place] Controlled Substance Database      *KESLEIGH MORSON was evaluated in Emergency Department on 06/23/2019 for the symptoms described in the history of present illness. She was evaluated in the context of the global COVID-19 pandemic, which necessitated consideration that the patient might be at risk for infection with the SARS-CoV-2 virus that causes COVID-19. Institutional protocols and algorithms that pertain to the evaluation of patients at risk for COVID-19 are in a state of rapid change based on information released by regulatory bodies including the CDC and federal and state organizations. These policies and algorithms were followed during the patient's care in the ED.  Some ED evaluations and interventions may be delayed as a result of limited staffing during the pandemic.*   Clinical Course as of Jun 22 1510  Thu Jun 22, 6572  6913 53 year old female here with severe lower back pain.  She reports left lower extremity symptoms and reported urinary retention, the bladder scan today is unremarkable.  Given the severity of her pain with question of urinary retention, will obtain MRI to rule out cauda equina.  No fever, chills, history of IV drug use, or symptoms to suggest osteomyelitis or epidural abscess.  Patient is otherwise hemodynamically stable.  No pulsatile masses or referred abdominal pain to suggest AAA or alternative  intra-abdominal pathology.  Screening lab work sent.  Urinalysis is pending.   [CI]    Clinical Course User Index [CI] Duffy Bruce, MD    Medical Decision Making: As above.  Lab work overall reassuring.  Urinalysis without signs UTI.  MRI shows no emergent pathology, with likely degenerative disc disease contributing to her symptoms.  Discussed with Dr. Cari Caraway, who had reviewed the MRI prior to read.  Will discharge with supportive care and analgesics.  No apparent acute surgical pathology.  ____________________________________________  FINAL CLINICAL IMPRESSION(S) / ED DIAGNOSES  Final diagnoses:  Lumbar herniated disc  Degenerative disc disease, lumbar  Degenerative disc disease, thoracic     MEDICATIONS GIVEN DURING THIS VISIT:  Medications  HYDROmorphone (DILAUDID) injection 1 mg (1 mg Intravenous Given 06/23/19 1110)  dexamethasone (DECADRON) injection 10 mg (10 mg Intravenous Given 06/23/19 1110)  ketorolac (TORADOL) 30 MG/ML injection 15 mg (15 mg Intravenous Given 06/23/19 1109)  HYDROmorphone (DILAUDID) injection 0.5 mg (0.5 mg Intravenous Given 06/23/19 1445)  potassium chloride SA (K-DUR) CR tablet 40 mEq (40 mEq Oral Given 06/23/19 1444)     ED Discharge Orders         Ordered    HYDROcodone-acetaminophen (NORCO/VICODIN) 5-325 MG tablet  Every 4 hours PRN     06/23/19 1428    predniSONE (DELTASONE) 20 MG tablet  Daily     06/23/19 1428    naproxen (NAPROSYN) 375 MG tablet  2 times daily with meals     06/23/19 1428           Note:  This document was prepared using Dragon voice recognition software and may include unintentional dictation errors.   Duffy Bruce, MD 06/23/19 [phone]

## 2019-06-23 NOTE — ED Triage Notes (Signed)
Pt in via POV, from [facility], presents with severe back pain, worsening over the last 3 days, reports she has not urinated since Tuesday.  Denies hx of same.  Pt appears uncomfortable.

## 2019-09-20 ENCOUNTER — Ambulatory Visit: Payer: Self-pay

## 2019-10-07 ENCOUNTER — Other Ambulatory Visit: Payer: Self-pay

## 2020-01-19 ENCOUNTER — Emergency Department: Payer: 59

## 2020-01-19 ENCOUNTER — Encounter: Payer: Self-pay | Admitting: Emergency Medicine

## 2020-01-19 ENCOUNTER — Emergency Department
Admission: EM | Admit: 2020-01-19 | Discharge: 2020-01-19 | Disposition: A | Discharge status: Home / Self Care | Payer: 59 | Attending: Emergency Medicine | Admitting: Emergency Medicine

## 2020-01-19 ENCOUNTER — Other Ambulatory Visit: Payer: Self-pay

## 2020-01-19 DIAGNOSIS — Z79899 Other long term (current) drug therapy: Secondary | ICD-10-CM | POA: Insufficient documentation

## 2020-01-19 DIAGNOSIS — E278 Other specified disorders of adrenal gland: Secondary | ICD-10-CM

## 2020-01-19 DIAGNOSIS — I10 Essential (primary) hypertension: Secondary | ICD-10-CM | POA: Insufficient documentation

## 2020-01-19 DIAGNOSIS — E119 Type 2 diabetes mellitus without complications: Secondary | ICD-10-CM | POA: Insufficient documentation

## 2020-01-19 DIAGNOSIS — N200 Calculus of kidney: Secondary | ICD-10-CM | POA: Diagnosis not present

## 2020-01-19 DIAGNOSIS — R300 Dysuria: Secondary | ICD-10-CM | POA: Insufficient documentation

## 2020-01-19 DIAGNOSIS — R9389 Abnormal findings on diagnostic imaging of other specified body structures: Secondary | ICD-10-CM | POA: Diagnosis not present

## 2020-01-19 DIAGNOSIS — R1084 Generalized abdominal pain: Secondary | ICD-10-CM | POA: Diagnosis present

## 2020-01-19 DIAGNOSIS — Z7984 Long term (current) use of oral hypoglycemic drugs: Secondary | ICD-10-CM | POA: Insufficient documentation

## 2020-01-19 HISTORY — DX: Type 2 diabetes mellitus without complications: E11.9

## 2020-01-19 LAB — COMPREHENSIVE METABOLIC PANEL
ALT: 20 U/L (ref 0–44)
AST: 17 U/L (ref 15–41)
Albumin: 3.9 g/dL (ref 3.5–5.0)
Alkaline Phosphatase: 103 U/L (ref 38–126)
Anion gap: 7 (ref 5–15)
BUN: 18 mg/dL (ref 6–20)
CO2: 28 mmol/L (ref 22–32)
Calcium: 9.5 mg/dL (ref 8.9–10.3)
Chloride: 105 mmol/L (ref 98–111)
Creatinine, Ser: 0.81 mg/dL (ref 0.44–1.00)
GFR calc Af Amer: >60 mL/min (ref >60)
GFR calc non Af Amer: >60 mL/min (ref >60)
Glucose, Bld: 240 mg/dL — ABNORMAL HIGH (ref 70–99)
Potassium: 4.2 mmol/L (ref 3.5–5.1)
Sodium: 140 mmol/L (ref 135–145)
Total Bilirubin: 0.7 mg/dL (ref 0.3–1.2)
Total Protein: 7.1 g/dL (ref 6.5–8.1)

## 2020-01-19 LAB — URINALYSIS, COMPLETE (UACMP) WITH MICROSCOPIC
Bacteria, UA: NONE SEEN
Bilirubin Urine: NEGATIVE
Glucose, UA: >=500 mg/dL — AB
Hgb urine dipstick: NEGATIVE
Ketones, ur: NEGATIVE mg/dL
Leukocytes,Ua: NEGATIVE
Nitrite: NEGATIVE
Protein, ur: NEGATIVE mg/dL
Specific Gravity, Urine: 1.028 (ref 1.005–1.030)
Squamous Epithelial / HPF: NONE SEEN (ref 0–5)
pH: 5 (ref 5.0–8.0)

## 2020-01-19 LAB — CBC WITH DIFFERENTIAL/PLATELET
Abs Immature Granulocytes: 0.02 10*3/uL (ref 0.00–0.07)
Basophils Absolute: 0 10*3/uL (ref 0.0–0.1)
Basophils Relative: 0 %
Eosinophils Absolute: 0 10*3/uL (ref 0.0–0.5)
Eosinophils Relative: 0 %
HCT: 42.1 % (ref 36.0–46.0)
Hemoglobin: 13.5 g/dL (ref 12.0–15.0)
Immature Granulocytes: 0 %
Lymphocytes Relative: 26 %
Lymphs Abs: 1.6 10*3/uL (ref 0.7–4.0)
MCH: 29.2 pg (ref 26.0–34.0)
MCHC: 32.1 g/dL (ref 30.0–36.0)
MCV: 90.9 fL (ref 80.0–100.0)
Monocytes Absolute: 0.4 10*3/uL (ref 0.1–1.0)
Monocytes Relative: 7 %
Neutro Abs: 4.1 10*3/uL (ref 1.7–7.7)
Neutrophils Relative %: 67 %
Platelets: 237 10*3/uL (ref 150–400)
RBC: 4.63 MIL/uL (ref 3.87–5.11)
RDW: 14.2 % (ref 11.5–15.5)
WBC: 6.1 10*3/uL (ref 4.0–10.5)
nRBC: 0 % (ref 0.0–0.2)

## 2020-01-19 LAB — LIPASE, BLOOD: Lipase: 26 U/L (ref 11–51)

## 2020-01-19 LAB — GLUCOSE, CAPILLARY: Glucose-Capillary: 223 mg/dL — ABNORMAL HIGH (ref 70–99)

## 2020-01-19 MED ORDER — IOHEXOL 300 MG/ML  SOLN
100.0000 mL | Freq: Once | INTRAMUSCULAR | Status: AC | PRN
  Administered 2020-01-19: 100 mL via INTRAVENOUS
  Filled 2020-01-19: qty 100

## 2020-01-19 MED ORDER — IOHEXOL 9 MG/ML PO SOLN
500.0000 mL | ORAL | Status: AC
  Administered 2020-01-19 (×2): 500 mL via ORAL
  Filled 2020-01-19 (×2): qty 500

## 2020-01-19 NOTE — Discharge Instructions (Addendum)
Follow-up with your primary care provider if any continued problems.  The kidney stone is very tiny and most likely you will pass it without any difficulty.  Drink lots of fluids.  Your urine did not show any signs of infection however your blood sugar was elevated.  You will need to start back on your diabetes medicine and begin watching what you are eating to get your blood sugar down.  Also the cyst that was seen on your left adrenal gland was there in 2016 and is considered benign as it is not changed.  This is something that you can also follow-up with your primary care provider.

## 2020-01-19 NOTE — ED Notes (Signed)
See triage note  Presents with urinary freq and lower back pain  States pain is moving into left leg  Ambulates well  States she is also having burning with urination

## 2020-01-19 NOTE — ED Provider Notes (Signed)
[facility] Emergency Department Provider Note  ____________________________________________   First MD Initiated Contact with Patient 01/19/20 1155     (approximate)  I have reviewed the triage vital signs and the nursing notes.   HISTORY  Chief Complaint Pelvic Pain and Dysuria   HPI [name] is a 54 y.o. female presents to the ED with complaint of urinary frequency and low back pain.  Patient states that she also has experienced burning on urination.  She denies any vaginal discharge.  Patient also complains of abdominal pain but no symptoms of diarrhea or constipation.  Abdominal pain seems to be worse when she is urinating and she describes it as generalized.  Patient also reports that she has not taken her diabetic medicine for the last 3 days.  She states she has not run out of medicine she just has not been taking it.  She states the only thing she is eaten today has been breakfast which consisted of toast, jelly and orange juice.      Past Medical History:  Diagnosis Date  . Asthma   . Depression   . Diabetes mellitus without complication ([place])   . Hypertension     Patient Active Problem List   Diagnosis Date Noted  . Influenza A 01/26/2019  . Acute renal failure (ARF) ([place]) 01/26/2019  . Marijuana use 06/12/2017  . Acute pain of right knee 10/14/2016  . Diabetic neuropathy ([place]) 01/07/2016  . Plantar fasciitis of left foot 11/26/2015  . Back pain 10/10/2015  . Asthma 09/29/2015  . Essential hypertension 09/29/2015  . Diabetes mellitus ([employer]) 09/29/2015    Past Surgical History:  Procedure Laterality Date  . ABDOMINAL HYSTERECTOMY    . TUBAL LIGATION      Prior to Admission medications   Medication Sig Start Date End Date Taking? Authorizing Provider  albuterol (PROVENTIL HFA;VENTOLIN HFA) 108 (90 Base) MCG/ACT inhaler Inhale 1-2 puffs into the lungs every 6 (six) hours as needed for wheezing or shortness of breath. 01/28/19    Lule, Sara Chu, PA  albuterol (PROVENTIL) (2.5 MG/3ML) 0.083% nebulizer solution Take 3 mLs (2.5 mg total) by nebulization every 2 (two) hours as needed for wheezing or shortness of breath. 01/28/19   Ripley Fraise, PA  atorvastatin (LIPITOR) 10 MG tablet Take 10 mg by mouth daily.    [provider]  cyclobenzaprine (FLEXERIL) 10 MG tablet Take 1 tablet (10 mg total) by mouth 2 (two) times daily as needed for muscle spasms. 01/28/19   Ripley Fraise, PA  diclofenac sodium (VOLTAREN) 1 % GEL Apply 2 g topically 4 (four) times daily as needed (pain). 01/28/19   Ripley Fraise, PA  gabapentin (NEURONTIN) 300 MG capsule Take 1 capsule (300 mg total) by mouth 2 (two) times daily. 01/28/19   Lule, Joana, PA  glipiZIDE (GLUCOTROL) 5 MG tablet Take 1 tablet (5 mg total) by mouth every morning. 01/28/19   Ripley Fraise, PA  loratadine (CLARITIN) 10 MG tablet Take 1 tablet (10 mg total) by mouth daily. 01/28/19   Ripley Fraise, PA  losartan (COZAAR) 50 MG tablet Take 1 tablet (50 mg total) by mouth daily. 01/28/19   Ripley Fraise, PA  metFORMIN (GLUCOPHAGE) 1000 MG tablet Take 1 tablet (1,000 mg total) by mouth 2 (two) times daily with a meal. See discharge instructions for how to take medicine at first 01/28/19 06/23/19  Ripley Fraise, PA  omeprazole (PRILOSEC) 20 MG capsule Take 2 capsules (40 mg total) by mouth daily. 01/28/19  Ripley Fraise, PA  sertraline (ZOLOFT) 50 MG tablet Take 1.5 tablets (75 mg total) by mouth every morning. 01/28/19   Ripley Fraise, PA    Allergies Patient has no known allergies.  Family History  Problem Relation Age of Onset  . Hypertension Mother     Social History Social History   Tobacco Use  . Smoking status: Former Smoker    Packs/day: 3.00    Years: 10.00    Pack years: 30.00    Types: Cigarettes    Quit date: 12/22/2004    Years since quitting: 15.0  . Smokeless tobacco: Never Used  Substance Use Topics  . Alcohol use: No  . Drug use: No    Review of Systems Constitutional: No  fever/chills Eyes: No visual changes. ENT: No sore throat. Cardiovascular: Denies chest pain. Respiratory: Denies shortness of breath. Gastrointestinal: Positive generalized abdominal pain.  No nausea, no vomiting.  No diarrhea.  No constipation. Genitourinary: Positive for dysuria.  Status post hysterectomy.  She denies any vaginal discharge. Musculoskeletal: Negative for back pain. Skin: Negative for rash. Neurological: Negative for headaches, focal weakness or numbness. ____________________________________________   PHYSICAL EXAM:  VITAL SIGNS: ED Triage Vitals  Enc Vitals Group     BP 01/19/20 1107 (!) 157/96     Pulse Rate 01/19/20 1107 73     Resp 01/19/20 1107 16     Temp 01/19/20 1107 98.1 F (36.7 C)     Temp Source 01/19/20 1107 Oral     SpO2 01/19/20 1107 100 %     Weight 01/19/20 1128 189 lb (85.7 kg)     Height 01/19/20 1128 5' 1.5" (1.562 m)     Head Circumference --      Peak Flow --      Pain Score 01/19/20 1128 7     Pain Loc --      Pain Edu? --      Excl. in [place]? --     Constitutional: Alert and oriented. Well appearing and in no acute distress. Eyes: Conjunctivae are normal.  Head: Atraumatic. Nose: No congestion/rhinnorhea. Mouth/Throat: Mucous membranes are moist.  Oropharynx non-erythematous. Neck: No stridor.   Cardiovascular: Normal rate, regular rhythm. Grossly normal heart sounds.  Good peripheral circulation. Respiratory: Normal respiratory effort.  No retractions. Lungs CTAB. Gastrointestinal: Soft and nontender. No distention.  Bowel sounds are normoactive x4 quadrants.  On palpation there is generalized tenderness around the umbilicus, left and right lower quadrant.  No point tenderness and no rebound is noted. Musculoskeletal: Moves upper and lower extremities without any difficulty.  Normal gait was noted.  Patient was noted to walk to the restroom while in the ED without any difficulty or assistance. Neurologic:  Normal speech and  language. No gross focal neurologic deficits are appreciated. No gait instability. Skin:  Skin is warm, dry and intact. No rash noted. Psychiatric: Mood and affect are normal. Speech and behavior are normal.  ____________________________________________   LABS (all labs ordered are listed, but only abnormal results are displayed)  Labs Reviewed  URINALYSIS, COMPLETE (UACMP) WITH MICROSCOPIC - Abnormal; Notable for the following components:      Result Value   Color, Urine YELLOW (*)    APPearance CLEAR (*)    Glucose, UA >=500 (*)    All other components within normal limits  GLUCOSE, CAPILLARY - Abnormal; Notable for the following components:   Glucose-Capillary 223 (*)    All other components within normal limits  COMPREHENSIVE METABOLIC PANEL - Abnormal; Notable  for the following components:   Glucose, Bld 240 (*)    All other components within normal limits  CBC WITH DIFFERENTIAL/PLATELET  LIPASE, BLOOD  CBG MONITORING, ED   RADIOLOGY   Official radiology report(s): CT ABDOMEN PELVIS W CONTRAST  Result Date: 01/19/2020 CLINICAL DATA:  Acute nonlocalized abdominal pain, urinary frequency, lower back pain, pain moving into LEFT leg, burning with urination EXAM: CT ABDOMEN AND PELVIS WITH CONTRAST TECHNIQUE: Multidetector CT imaging of the abdomen and pelvis was performed using the standard protocol following bolus administration of intravenous contrast. Sagittal and coronal MPR images reconstructed from axial data set. CONTRAST:  149mL OMNIPAQUE IOHEXOL 300 MG/ML SOLN IV. Dilute oral contrast. COMPARISON:  04/09/2015 FINDINGS: Lower chest: Lung bases clear Hepatobiliary: Few tiny hepatic cysts unchanged. Gallbladder liver otherwise normal appearance. Pancreas: Normal appearance Spleen: Normal appearance Adrenals/Urinary Tract: RIGHT adrenal gland and RIGHT kidney normal appearance. Small mass with central calcification identified adjacent to the LEFT adrenal gland, 2.9 x 2.3 x 2.1  cm, indeterminate but stable since 2016. Small cyst and tiny nonobstructing calculus LEFT kidney. No additional renal masses, hydronephrosis or hydroureter. Ureters and bladder unremarkable Stomach/Bowel: Normal appendix. Diverticulosis of descending and sigmoid colon without evidence of diverticulitis. Well distended stomach. Remaining bowel loops unremarkable. Vascular/Lymphatic: Vascular structures patent. Aorta normal caliber. No adenopathy. Reproductive: Uterus surgically absent. Nonvisualization of ovaries. Other: No free air or free fluid. No hernia or inflammatory process. Musculoskeletal: Unremarkable IMPRESSION: Indeterminate LEFT adrenal/para-adrenal mass 2.9 cm greatest size, grossly stable in size and character since 2016 suggesting this is benign; this does not have definitive CT characteristics for an adrenal adenoma. Distal colonic diverticulosis without evidence of diverticulitis. No acute abnormalities. Electronically Signed   By: Lavonia Dana M.D.   On: 01/19/2020 15:37    ____________________________________________   PROCEDURES  Procedure(s) performed (including Critical Care):  Procedures   ____________________________________________   INITIAL IMPRESSION / ASSESSMENT AND PLAN / ED COURSE  As part of my medical decision making, I reviewed the following data within the electronic MEDICAL RECORD NUMBER Notes from prior ED visits and [place] Controlled Substance Database  54 year old female presents to the ED with complaint of generalized abdominal pain for 1 week.  Patient states she also has a sensation of dysuria while urinating.  Patient denies any fever or chills.  She denies any hematuria.  Patient also states that she is not taking her diabetes medicine for the last 3 days but assures that she has medication at home but she is just not taking it.  She also has not been eating as she was instructed with her diabetes.  Urinalysis does show glucose greater than 500.  Lab work is  unremarkable with the exception of her glucose being 240.  Patient had generalized abdominal pain without a focal area.  CT showed left renal stone minimal size.  There was also a benign left adrenal gland cyst that was seen on previous study in 2016 and is considered stable.  Patient was made aware of her findings and she is to continue taking her regular medication.  She also is strongly encouraged to restart her diabetes medication as her blood sugar was 240 and explained to her the complications that can happen with noncompliance and uncontrolled diabetes.  Patient will follow up with her PCP if any continued problems or concerns.  ____________________________________________   FINAL CLINICAL IMPRESSION(S) / ED DIAGNOSES  Final diagnoses:  Left renal stone  Benign mass of left adrenal gland [facility])     ED  Discharge Orders    None       Note:  This document was prepared using Dragon voice recognition software and may include unintentional dictation errors.    Johnn Hai, PA-C 01/20/20 1506    Earleen Newport, MD 01/20/20 [phone]

## 2020-01-19 NOTE — ED Triage Notes (Signed)
Patient reports having pelvic/suprapubic pain for more than one week. Reports "tinlging" sensation with urination. Denies any known fevers.

## 2020-02-21 NOTE — Progress Notes (Signed)
02/22/2020 8:44 PM   [name] Mar 25, 1966 [id number]  Referring provider: [name], [place],   [id number]  Chief Complaint  Patient presents with  . Nephrolithiasis    HPI: [name] is a 54 yo [place] American F with a personal history of poorly controlled diabetes presents today for the evaluation and management of left renal stone and  left adrenal gland mass.  She was having back issues so she was seeing a PT and neurology. She reports of a sharp (needle-like) constant pain in the right pelvis area that radiates to lower abdomen and numbness on left side. Diffuse lower abdomen pain and pressure. This started 2-3 months ago. PT recommended her to go to ED.   She presented to ED on 01/19/2020 for pelvic pain and dysuria. Her urine in ER unremarkable except for >500 glucose.     CT Abd/pelvis shows a indeterminate left adernal/para-adrenal mass 2.9 cm greatest size, stable since 2016, small cyst and tiny nonobstructing calculus in left kidney.   She is a somewhat difficult historian today first saying her LLQ hurts but pointing to her right.  Pain radiations from her midline back, across her right flank to her RLQ then across her whole lower abdomen like "pressure" over to her left flank.  It is constant and present for months.  She has left sided tingling and numbness from her face to her leg which comes and goes.  BP controlled on 2 meds.   She denies any issues with potassium with recent K 4.2 in ED.  No flushing, anxiety, or palpations.    She denies constipation or diarrhea, gross hematuria, kidney stones and vaginal bulge.  Most recent HbA1cs 9.6.  PMH: Past Medical History:  Diagnosis Date  . Asthma   . Depression   . Diabetes mellitus without complication ([place])   . Hypertension     Surgical History: Past Surgical History:  Procedure Laterality Date  . ABDOMINAL HYSTERECTOMY    . TUBAL LIGATION      Home Medications:   Allergies as of 02/22/2020   No Known Allergies     Medication List       Accurate as of February 22, 2020  8:44 PM. If you have any questions, ask your nurse or doctor.        albuterol 108 (90 Base) MCG/ACT inhaler Commonly known as: VENTOLIN HFA Inhale 1-2 puffs into the lungs every 6 (six) hours as needed for wheezing or shortness of breath.   albuterol (2.5 MG/3ML) 0.083% nebulizer solution Commonly known as: PROVENTIL Take 3 mLs (2.5 mg total) by nebulization every 2 (two) hours as needed for wheezing or shortness of breath.   atorvastatin 10 MG tablet Commonly known as: LIPITOR Take 10 mg by mouth daily.   cyclobenzaprine 10 MG tablet Commonly known as: FLEXERIL Take 1 tablet (10 mg total) by mouth 2 (two) times daily as needed for muscle spasms.   diclofenac sodium 1 % Gel Commonly known as: VOLTAREN Apply 2 g topically 4 (four) times daily as needed (pain).   gabapentin 300 MG capsule Commonly known as: NEURONTIN Take 1 capsule (300 mg total) by mouth 2 (two) times daily.   glipiZIDE 5 MG tablet Commonly known as: Glucotrol Take 1 tablet (5 mg total) by mouth every morning.   hydrochlorothiazide 25 MG tablet Commonly known as: HYDRODIURIL Take 25 mg by mouth daily.   loratadine 10 MG tablet Commonly known as: CLARITIN Take 1 tablet (10  mg total) by mouth daily.   losartan 50 MG tablet Commonly known as: COZAAR Take 1 tablet (50 mg total) by mouth daily.   metFORMIN 1000 MG tablet Commonly known as: Glucophage Take 1 tablet (1,000 mg total) by mouth 2 (two) times daily with a meal. See discharge instructions for how to take medicine at first   omeprazole 20 MG capsule Commonly known as: PRILOSEC Take 2 capsules (40 mg total) by mouth daily.   sertraline 50 MG tablet Commonly known as: ZOLOFT Take 1.5 tablets (75 mg total) by mouth every morning.   Vitamin D3 1.25 MG (50000 UT) Caps Take 1 capsule by mouth once a week.       Allergies: No Known  Allergies  Family History: Family History  Problem Relation Age of Onset  . Hypertension Mother     Social History:  reports that she quit smoking about 15 years ago. Her smoking use included cigarettes. She has a 30.00 pack-year smoking history. She has never used smokeless tobacco. She reports that she does not drink alcohol or use drugs.   Physical Exam: BP (!) 155/109   Pulse (!) 104   Ht 5\' 1"  (1.549 m)   Wt 189 lb (85.7 kg)   BMI 35.71 kg/m   Constitutional:  Alert and oriented, No acute distress. HEENT: [name] AT, moist mucus membranes.  Trachea midline, no masses. Cardiovascular: No clubbing, cyanosis, or edema. Respiratory: Normal respiratory effort, no increased work of breathing. GU: No CVA tenderness, diffuse bilateral tenderness  Abd: Obese, benign, no rebound or gaurding Skin: No rashes, bruises or suspicious lesions. Neurologic: Grossly intact, no focal deficits, moving all 4 extremities. Psychiatric: Normal mood and affect.  Laboratory Data: Lab Results  Component Value Date   WBC 6.1 01/19/2020   HGB 13.5 01/19/2020   HCT 42.1 01/19/2020   MCV 90.9 01/19/2020   PLT 237 01/19/2020    Lab Results  Component Value Date   CREATININE 0.81 01/19/2020    Lab Results  Component Value Date   HGBA1C 10.7 (H) 01/27/2019    Urinalysis UA today negative  Pertinent Imaging: CLINICAL DATA:  Acute nonlocalized abdominal pain, urinary frequency, lower back pain, pain moving into LEFT leg, burning with urination  EXAM: CT ABDOMEN AND PELVIS WITH CONTRAST  TECHNIQUE: Multidetector CT imaging of the abdomen and pelvis was performed using the standard protocol following bolus administration of intravenous contrast. Sagittal and coronal MPR images reconstructed from axial data set.  CONTRAST:  195mL OMNIPAQUE IOHEXOL 300 MG/ML SOLN IV. Dilute oral contrast.  COMPARISON:  04/09/2015  FINDINGS: Lower chest: Lung bases clear  Hepatobiliary: Few tiny  hepatic cysts unchanged. Gallbladder liver otherwise normal appearance.  Pancreas: Normal appearance  Spleen: Normal appearance  Adrenals/Urinary Tract: RIGHT adrenal gland and RIGHT kidney normal appearance. Small mass with central calcification identified adjacent to the LEFT adrenal gland, 2.9 x 2.3 x 2.1 cm, indeterminate but stable since 2016. Small cyst and tiny nonobstructing calculus LEFT kidney. No additional renal masses, hydronephrosis or hydroureter. Ureters and bladder unremarkable  Stomach/Bowel: Normal appendix. Diverticulosis of descending and sigmoid colon without evidence of diverticulitis. Well distended stomach. Remaining bowel loops unremarkable.  Vascular/Lymphatic: Vascular structures patent. Aorta normal caliber. No adenopathy.  Reproductive: Uterus surgically absent. Nonvisualization of ovaries.  Other: No free air or free fluid. No hernia or inflammatory process.  Musculoskeletal: Unremarkable  IMPRESSION: Indeterminate LEFT adrenal/para-adrenal mass 2.9 cm greatest size, grossly stable in size and character since 2016 suggesting this is benign; this does  not have definitive CT characteristics for an adrenal adenoma.  Distal colonic diverticulosis without evidence of diverticulitis.  No acute abnormalities.   Electronically Signed   By: Lavonia Dana M.D.   On: 01/19/2020 15:37   I have personally reviewed the images and agree with radiologist interpretation.   Stable from previous  Records/ labs from ED and PCP reviewed  Assessment & Plan:    1. 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 Left renal stone Punctate unrelated to pain; would not recommend any intervention given small size Nonobstructing Incidental finding No previous history of stones Encouraged hydration  3. Lower Abdominal pain  Unlikely GU related Very difficulty and inconsistent historian Recommend following up with PCP and PT  Lifestyle recommendations including weightloss, glucose control, etc  [facility] [address] [place], [place] [address] [phone]  I, Lucas Mallow, am acting as a scribe for Dr. Hollice Espy,  I have reviewed the above documentation for accuracy and completeness, and I agree with the above.   Hollice Espy, MD   I spent 60  total minutes on the day of the encounter including pre-visit review of the medical record, face-to-face time with the patient, and post visit ordering of labs/imaging/tests.

## 2020-02-22 ENCOUNTER — Other Ambulatory Visit: Payer: Self-pay

## 2020-02-22 ENCOUNTER — Ambulatory Visit (INDEPENDENT_AMBULATORY_CARE_PROVIDER_SITE_OTHER): Payer: 59 | Admitting: Urology

## 2020-02-22 ENCOUNTER — Encounter: Payer: Self-pay | Admitting: Urology

## 2020-02-22 VITALS — BP 155/109 | HR 104 | Ht 61.0 in | Wt 189.0 lb

## 2020-02-22 DIAGNOSIS — N200 Calculus of kidney: Secondary | ICD-10-CM

## 2020-02-22 DIAGNOSIS — R1084 Generalized abdominal pain: Secondary | ICD-10-CM

## 2020-02-22 DIAGNOSIS — D3502 Benign neoplasm of left adrenal gland: Secondary | ICD-10-CM | POA: Diagnosis not present

## 2020-02-22 LAB — URINALYSIS, COMPLETE
Bilirubin, UA: NEGATIVE
Ketones, UA: NEGATIVE
Leukocytes,UA: NEGATIVE
Nitrite, UA: NEGATIVE
RBC, UA: NEGATIVE
Specific Gravity, UA: >1.03 — ABNORMAL HIGH (ref 1.005–1.030)
Urobilinogen, Ur: 0.2 mg/dL (ref 0.2–1.0)
pH, UA: 5 (ref 5.0–7.5)

## 2020-02-22 LAB — MICROSCOPIC EXAMINATION
Bacteria, UA: NONE SEEN
RBC, Urine: NONE SEEN /hpf (ref 0–2)

## 2020-03-01 ENCOUNTER — Other Ambulatory Visit: Payer: Self-pay

## 2020-03-01 ENCOUNTER — Ambulatory Visit: Payer: 59 | Admitting: General Surgery

## 2020-03-01 ENCOUNTER — Encounter: Payer: Self-pay | Admitting: General Surgery

## 2020-03-01 VITALS — BP 157/85 | HR 66 | Temp 97.3°F | Ht 61.0 in | Wt 173.6 lb

## 2020-03-01 DIAGNOSIS — S40022A Contusion of left upper arm, initial encounter: Secondary | ICD-10-CM

## 2020-03-01 NOTE — Progress Notes (Signed)
Patient ID: [name], female   DOB: [birth year], 54 y.o.   MRN: [id number]  Chief Complaint  Patient presents with  . New Patient (Initial Visit)    new patient cyst/ left upper arm referred by Dr. Marlane Mingle    HPI [name] is a 54 y.o. female.  She was referred by her primary care provider, Dr. Rosario Jacks, patient have a "cyst" on her arm.  Ms. Roan states that she has been having issues with numbness on her left side and is caused her to suffer a fall in the shower about 4 days ago.  She says that she has a raised area on her left arm that is quite tender.  She was referred to general surgery for further evaluation and management.  The lesion was not present prior to her fall.  She denies any fevers or chills.  No nausea or vomiting.  The tenderness is localized to the raised area on her left bicep.  It is worse with movement or manipulation.   Past Medical History:  Diagnosis Date  . Asthma   . Depression   . Diabetes mellitus without complication ([place])   . Hypertension     Past Surgical History:  Procedure Laterality Date  . ABDOMINAL HYSTERECTOMY    . TUBAL LIGATION      Family History  Problem Relation Age of Onset  . Hypertension Mother     Social History Social History   Tobacco Use  . Smoking status: Former Smoker    Packs/day: 3.00    Years: 10.00    Pack years: 30.00    Types: Cigarettes    Quit date: 12/22/2004    Years since quitting: 15.2  . Smokeless tobacco: Never Used  Substance Use Topics  . Alcohol use: No  . Drug use: No    No Known Allergies  Current Outpatient Medications  Medication Sig Dispense Refill  . albuterol (PROVENTIL HFA;VENTOLIN HFA) 108 (90 Base) MCG/ACT inhaler Inhale 1-2 puffs into the lungs every 6 (six) hours as needed for wheezing or shortness of breath. 1 Inhaler 0  . albuterol (PROVENTIL) (2.5 MG/3ML) 0.083% nebulizer solution Take 3 mLs (2.5 mg total) by nebulization every 2 (two) hours as needed for wheezing  or shortness of breath. 75 mL 0  . atorvastatin (LIPITOR) 10 MG tablet Take 10 mg by mouth daily.    . Cholecalciferol (VITAMIN D3) 1.25 MG (50000 UT) CAPS Take 1 capsule by mouth once a week.    . cyclobenzaprine (FLEXERIL) 10 MG tablet Take 1 tablet (10 mg total) by mouth 2 (two) times daily as needed for muscle spasms. 20 tablet 0  . diclofenac sodium (VOLTAREN) 1 % GEL Apply 2 g topically 4 (four) times daily as needed (pain). 1 Tube 0  . gabapentin (NEURONTIN) 300 MG capsule Take 1 capsule (300 mg total) by mouth 2 (two) times daily. 60 capsule 0  . glipiZIDE (GLUCOTROL) 5 MG tablet Take 1 tablet (5 mg total) by mouth every morning. 30 tablet 0  . hydrochlorothiazide (HYDRODIURIL) 25 MG tablet Take 25 mg by mouth daily.    Marland Kitchen loratadine (CLARITIN) 10 MG tablet Take 1 tablet (10 mg total) by mouth daily. 30 tablet 0  . losartan (COZAAR) 50 MG tablet Take 1 tablet (50 mg total) by mouth daily. 30 tablet 1  . metFORMIN (GLUCOPHAGE) 1000 MG tablet Take 1 tablet (1,000 mg total) by mouth 2 (two) times daily with a meal. See discharge instructions for how to  take medicine at first 60 tablet 1  . omeprazole (PRILOSEC) 20 MG capsule Take 2 capsules (40 mg total) by mouth daily. 60 capsule 0  . sertraline (ZOLOFT) 50 MG tablet Take 1.5 tablets (75 mg total) by mouth every morning. 45 tablet 0   No current facility-administered medications for this visit.    Review of Systems Review of Systems  All other systems reviewed and are negative.   Blood pressure (!) 157/85, pulse 66, temperature (!) 97.3 F (36.3 C), temperature source Temporal, height 5\' 1"  (1.549 m), weight 173 lb 9.6 oz (78.7 kg), SpO2 98 %.  Physical Exam Physical Exam Constitutional:      General: She is not in acute distress.    Appearance: Normal appearance. She is obese.  HENT:     Head: Normocephalic and atraumatic.     Nose:     Comments: Covered with a mask secondary to COVID-19 precautions    Mouth/Throat:      Comments: Covered with a mask secondary to COVID-19 precautions Eyes:     General: No scleral icterus.       Right eye: No discharge.        Left eye: No discharge.     Conjunctiva/sclera: Conjunctivae normal.  Cardiovascular:     Rate and Rhythm: Normal rate and regular rhythm.  Pulmonary:     Effort: Pulmonary effort is normal. No respiratory distress.  Abdominal:     Comments: Protuberant, consistent with her level of obesity.  Genitourinary:    Comments: Deferred Musculoskeletal:        General: Swelling, tenderness and signs of injury present.       Arms:     Cervical back: Normal range of motion.     Comments: There is a swollen and tender area involving the left biceps.  It is firm and she resists manipulation due to discomfort.  There is an overlying bruise that by appearance, is consistent with an injury about 4 days ago.  The swollen area is not fluctuant, hot, or red.  Skin:    General: Skin is warm and dry.     Findings: Bruising present.  Neurological:     General: No focal deficit present.     Mental Status: She is alert and oriented to person, place, and time.  Psychiatric:        Mood and Affect: Mood normal.        Behavior: Behavior normal.        Thought Content: Thought content normal.     Data Reviewed There are no relevant no data available to review.  Assessment Is a 54 year old woman who suffered a fall about 4 days ago.  She has a raised area on her left biceps with accompanying bruising.  This is most consistent with a hematoma, potentially a deep muscle hematoma due to the amount of discomfort she is experiencing.  Plan No surgical intervention is warranted at this time.  I have recommended that she apply warm compresses several times daily and take ibuprofen 600 mg every 6 hours.  If she does not experience improvement over the next couple of weeks, we will plan to get some imaging to determine if something else is going on.  Otherwise, I will see  her basis.  I did confirm that she is seeking evaluation with neurology for numbness and related fall.    Fredirick Maudlin 03/01/2020, 9:55 AM

## 2020-03-01 NOTE — Patient Instructions (Addendum)
Dr.Cannon suggested patient to take Ibuprofen to help with the pain or discomfort. Patient may apply warm compress. If patient notice area of concern, getting worse. Patient is advised to give our office a call to move forward with an ultrasound.   Hematoma A hematoma is a collection of blood under the skin, in an organ, in a body space, in a joint space, or in other tissue. The blood can thicken (clot) to form a lump that you can see and feel. The lump is often firm and may become sore and tender. Most hematomas get better in a few days to weeks. However, some hematomas may be serious and require medical care. Hematomas can range from very small to very large. What are the causes? This condition is caused by:  A blunt or penetrating injury.  A leakage from a blood vessel under the skin.  Some medical procedures, including surgeries, such as oral surgery, face lifts, and surgeries on the joints.  Some medical conditions that cause bleeding or bruising. There may be multiple hematomas that appear in different areas of the body. What increases the risk? You are more likely to develop this condition if:  You are an older adult.  You use blood thinners. What are the signs or symptoms?  Symptoms of this condition depend on where the hematoma is located.  Common symptoms of a hematoma that is under the skin include:  A firm lump on the body.  Pain and tenderness in the area.  Bruising. Blue, dark blue, purple-red, or yellowish skin (discoloration) may appear at the site of the hematoma if the hematoma is close to the surface of the skin. Common symptoms of a hematoma that is deep in the tissues or body spaces may be less obvious. They include:  A collection of blood in the stomach (intra-abdominal hematoma). This may cause pain in the abdomen, weakness, fainting, and shortness of breath.  A collection of blood in the head (intracranial hematoma). This may cause a headache or symptoms such  as weakness, trouble speaking or understanding, or a change in consciousness. How is this diagnosed? This condition is diagnosed based on:  Your medical history.  A physical exam.  Imaging tests, such as an ultrasound or CT scan. These may be needed if your health care provider suspects a hematoma in deeper tissues or body spaces.  Blood tests. These may be needed if your health care provider believes that the hematoma is caused by a medical condition. How is this treated? Treatment for this condition depends on the cause, size, and location of the hematoma. Treatment may include:  Doing nothing. The majority of hematomas do not need treatment as many of them go away on their own over time.  Surgery or close monitoring. This may be needed for large hematomas or hematomas that affect vital organs.  Medicines. Medicines may be given if there is an underlying medical cause for the hematoma. Follow these instructions at home: Managing pain, stiffness, and swelling   If directed, put ice on the affected area. ? Put ice in a plastic bag. ? Place a towel between your skin and the bag. ? Leave the ice on for 20 minutes, 2-3 times a day for the first couple of days.  If directed, apply heat to the affected area after applying ice for a couple of days. Use the heat source that your health care provider recommends, such as a moist heat pack or a heating pad. ? Place a towel between  your skin and the heat source. ? Leave the heat on for 20-30 minutes. ? Remove the heat if your skin turns bright red. This is especially important if you are unable to feel pain, heat, or cold. You may have a greater risk of getting burned.  Raise (elevate) the affected area above the level of your heart while you are sitting or lying down.  If told, wrap the affected area with an elastic bandage. The bandage applies pressure (compression) to the area, which may help to reduce swelling and promote healing. Do not  wrap the bandage too tightly around the affected area.  If your hematoma is on a leg or foot (lower extremity) and is painful, your health care provider may recommend crutches. Use them as told by your health care provider. General instructions  Take over-the-counter and prescription medicines only as told by your health care provider.  Keep all follow-up visits as told by your health care provider. This is important. Contact a health care provider if:  You have a fever.  The swelling or discoloration gets worse.  You develop more hematomas. Get help right away if:  Your pain is worse or your pain is not controlled with medicine.  Your skin over the hematoma breaks or starts bleeding.  Your hematoma is in your chest or abdomen and you have weakness, shortness of breath, or a change in consciousness.  You have a hematoma on your scalp that is caused by a fall or injury, and you also have: ? A headache that gets worse. ? Trouble speaking or understanding speech. ? Weakness. ? Change in alertness or consciousness. Summary  A hematoma is a collection of blood under the skin, in an organ, in a body space, in a joint space, or in other tissue.  This condition usually does not need treatment because many hematomas go away on their own over time.  Large hematomas, or those that may affect vital organs, may need surgical drainage or monitoring. If the hematoma is caused by a medical condition, medicines may be prescribed.  Get help right away if your hematoma breaks or starts to bleed, you have shortness of breath, or you have a headache or trouble speaking after a fall. This information is not intended to replace advice given to you by your health care provider. Make sure you discuss any questions you have with your health care provider. Document Revised: 05/04/2019 Document Reviewed: 05/13/2018 Elsevier Patient Education  2020 [employer].

## 2020-04-09 ENCOUNTER — Other Ambulatory Visit: Payer: Self-pay | Admitting: Neurology

## 2020-04-09 DIAGNOSIS — M542 Cervicalgia: Secondary | ICD-10-CM

## 2020-04-09 DIAGNOSIS — H53122 Transient visual loss, left eye: Secondary | ICD-10-CM

## 2020-04-21 ENCOUNTER — Ambulatory Visit: Payer: 59

## 2020-04-21 ENCOUNTER — Ambulatory Visit
Admission: RE | Admit: 2020-04-21 | Discharge: 2020-04-21 | Disposition: A | Discharge status: Home / Self Care | Payer: 59 | Source: Ambulatory Visit | Attending: Neurology | Admitting: Neurology

## 2020-04-21 ENCOUNTER — Other Ambulatory Visit: Payer: Self-pay

## 2020-04-21 DIAGNOSIS — H53122 Transient visual loss, left eye: Secondary | ICD-10-CM | POA: Diagnosis not present

## 2020-04-21 DIAGNOSIS — M542 Cervicalgia: Secondary | ICD-10-CM | POA: Diagnosis present

## 2020-04-21 MED ORDER — GADOBUTROL 1 MMOL/ML IV SOLN
7.5000 mL | Freq: Once | INTRAVENOUS | Status: AC | PRN
  Administered 2020-04-21: 7.5 mL via INTRAVENOUS

## 2020-05-04 ENCOUNTER — Other Ambulatory Visit: Payer: Self-pay | Admitting: Orthopedic Surgery

## 2020-05-04 DIAGNOSIS — M25512 Pain in left shoulder: Secondary | ICD-10-CM

## 2020-05-07 ENCOUNTER — Ambulatory Visit
Admission: RE | Admit: 2020-05-07 | Discharge: 2020-05-07 | Disposition: A | Discharge status: Home / Self Care | Payer: 59 | Source: Ambulatory Visit | Attending: Orthopedic Surgery | Admitting: Orthopedic Surgery

## 2020-05-07 ENCOUNTER — Other Ambulatory Visit: Payer: Self-pay

## 2020-05-07 DIAGNOSIS — M25512 Pain in left shoulder: Secondary | ICD-10-CM | POA: Diagnosis present

## 2020-05-11 ENCOUNTER — Emergency Department: Payer: 59

## 2020-05-11 ENCOUNTER — Inpatient Hospital Stay
Admission: EM | Admit: 2020-05-11 | Discharge: 2020-05-13 | DRG: 392 | Disposition: A | Discharge status: Home / Self Care | Payer: 59 | Attending: Internal Medicine | Admitting: Internal Medicine

## 2020-05-11 ENCOUNTER — Encounter: Payer: Self-pay | Admitting: Medical Oncology

## 2020-05-11 ENCOUNTER — Other Ambulatory Visit: Payer: Self-pay

## 2020-05-11 DIAGNOSIS — K219 Gastro-esophageal reflux disease without esophagitis: Secondary | ICD-10-CM

## 2020-05-11 DIAGNOSIS — E1149 Type 2 diabetes mellitus with other diabetic neurological complication: Secondary | ICD-10-CM

## 2020-05-11 DIAGNOSIS — K5732 Diverticulitis of large intestine without perforation or abscess without bleeding: Principal | ICD-10-CM | POA: Diagnosis present

## 2020-05-11 DIAGNOSIS — L0291 Cutaneous abscess, unspecified: Secondary | ICD-10-CM | POA: Diagnosis not present

## 2020-05-11 DIAGNOSIS — K5792 Diverticulitis of intestine, part unspecified, without perforation or abscess without bleeding: Secondary | ICD-10-CM | POA: Diagnosis not present

## 2020-05-11 DIAGNOSIS — E119 Type 2 diabetes mellitus without complications: Secondary | ICD-10-CM | POA: Diagnosis not present

## 2020-05-11 DIAGNOSIS — Z87891 Personal history of nicotine dependence: Secondary | ICD-10-CM

## 2020-05-11 DIAGNOSIS — J45909 Unspecified asthma, uncomplicated: Secondary | ICD-10-CM | POA: Diagnosis present

## 2020-05-11 DIAGNOSIS — I10 Essential (primary) hypertension: Secondary | ICD-10-CM

## 2020-05-11 DIAGNOSIS — F329 Major depressive disorder, single episode, unspecified: Secondary | ICD-10-CM | POA: Diagnosis present

## 2020-05-11 DIAGNOSIS — E1165 Type 2 diabetes mellitus with hyperglycemia: Secondary | ICD-10-CM | POA: Diagnosis present

## 2020-05-11 DIAGNOSIS — E785 Hyperlipidemia, unspecified: Secondary | ICD-10-CM

## 2020-05-11 DIAGNOSIS — E1169 Type 2 diabetes mellitus with other specified complication: Secondary | ICD-10-CM | POA: Diagnosis present

## 2020-05-11 DIAGNOSIS — E1140 Type 2 diabetes mellitus with diabetic neuropathy, unspecified: Secondary | ICD-10-CM | POA: Diagnosis present

## 2020-05-11 DIAGNOSIS — Z79899 Other long term (current) drug therapy: Secondary | ICD-10-CM

## 2020-05-11 DIAGNOSIS — F32A Depression, unspecified: Secondary | ICD-10-CM | POA: Diagnosis present

## 2020-05-11 DIAGNOSIS — Z7984 Long term (current) use of oral hypoglycemic drugs: Secondary | ICD-10-CM

## 2020-05-11 DIAGNOSIS — Z90710 Acquired absence of both cervix and uterus: Secondary | ICD-10-CM

## 2020-05-11 DIAGNOSIS — Z20822 Contact with and (suspected) exposure to covid-19: Secondary | ICD-10-CM | POA: Diagnosis present

## 2020-05-11 DIAGNOSIS — Z8249 Family history of ischemic heart disease and other diseases of the circulatory system: Secondary | ICD-10-CM

## 2020-05-11 LAB — URINALYSIS, COMPLETE (UACMP) WITH MICROSCOPIC
Bacteria, UA: NONE SEEN
Bilirubin Urine: NEGATIVE
Glucose, UA: >=500 mg/dL — AB
Hgb urine dipstick: NEGATIVE
Ketones, ur: 20 mg/dL — AB
Leukocytes,Ua: NEGATIVE
Nitrite: NEGATIVE
Protein, ur: 30 mg/dL — AB
Specific Gravity, Urine: 1.031 — ABNORMAL HIGH (ref 1.005–1.030)
pH: 5 (ref 5.0–8.0)

## 2020-05-11 LAB — CBC
HCT: 44.5 % (ref 36.0–46.0)
Hemoglobin: 14.6 g/dL (ref 12.0–15.0)
MCH: 29.3 pg (ref 26.0–34.0)
MCHC: 32.8 g/dL (ref 30.0–36.0)
MCV: 89.4 fL (ref 80.0–100.0)
Platelets: 263 10*3/uL (ref 150–400)
RBC: 4.98 MIL/uL (ref 3.87–5.11)
RDW: 14.3 % (ref 11.5–15.5)
WBC: 18.7 10*3/uL — ABNORMAL HIGH (ref 4.0–10.5)
nRBC: 0 % (ref 0.0–0.2)

## 2020-05-11 LAB — GLUCOSE, CAPILLARY
Glucose-Capillary: 266 mg/dL — ABNORMAL HIGH (ref 70–99)
Glucose-Capillary: 144 mg/dL — ABNORMAL HIGH (ref 70–99)

## 2020-05-11 LAB — COMPREHENSIVE METABOLIC PANEL
ALT: 25 U/L (ref 0–44)
AST: 18 U/L (ref 15–41)
Albumin: 4 g/dL (ref 3.5–5.0)
Alkaline Phosphatase: 124 U/L (ref 38–126)
Anion gap: 11 (ref 5–15)
BUN: 11 mg/dL (ref 6–20)
CO2: 25 mmol/L (ref 22–32)
Calcium: 9.5 mg/dL (ref 8.9–10.3)
Chloride: 99 mmol/L (ref 98–111)
Creatinine, Ser: 0.9 mg/dL (ref 0.44–1.00)
GFR calc Af Amer: >60 mL/min (ref >60)
GFR calc non Af Amer: >60 mL/min (ref >60)
Glucose, Bld: 333 mg/dL — ABNORMAL HIGH (ref 70–99)
Potassium: 3.5 mmol/L (ref 3.5–5.1)
Sodium: 135 mmol/L (ref 135–145)
Total Bilirubin: 1.6 mg/dL — ABNORMAL HIGH (ref 0.3–1.2)
Total Protein: 8.1 g/dL (ref 6.5–8.1)

## 2020-05-11 LAB — SARS CORONAVIRUS 2 BY RT PCR (HOSPITAL ORDER, PERFORMED IN ~~LOC~~ HOSPITAL LAB): SARS Coronavirus 2: NEGATIVE

## 2020-05-11 LAB — LIPASE, BLOOD: Lipase: 19 U/L (ref 11–51)

## 2020-05-11 MED ORDER — CYCLOBENZAPRINE HCL 10 MG PO TABS: 10.0000 mg | ORAL_TABLET | Freq: Two times a day (BID) | ORAL | Status: DC | PRN

## 2020-05-11 MED ORDER — ONDANSETRON HCL 4 MG/2ML IJ SOLN
4.0000 mg | Freq: Once | INTRAMUSCULAR | Status: AC
  Administered 2020-05-11: 4 mg via INTRAVENOUS
  Filled 2020-05-11: qty 2

## 2020-05-11 MED ORDER — OXYCODONE-ACETAMINOPHEN 5-325 MG PO TABS
1.0000 | ORAL_TABLET | Freq: Four times a day (QID) | ORAL | Status: AC | PRN
  Administered 2020-05-11: 1 via ORAL
  Filled 2020-05-11: qty 1

## 2020-05-11 MED ORDER — OXYCODONE-ACETAMINOPHEN 5-325 MG PO TABS
1.0000 | ORAL_TABLET | ORAL | Status: DC | PRN
  Administered 2020-05-11: 1 via ORAL
  Filled 2020-05-11: qty 1

## 2020-05-11 MED ORDER — PANTOPRAZOLE SODIUM 40 MG PO TBEC
40.0000 mg | DELAYED_RELEASE_TABLET | Freq: Every day | ORAL | Status: DC
  Administered 2020-05-12 – 2020-05-13 (×2): 40 mg via ORAL
  Filled 2020-05-11 (×2): qty 1

## 2020-05-11 MED ORDER — SERTRALINE HCL 50 MG PO TABS
75.0000 mg | ORAL_TABLET | ORAL | Status: DC
  Administered 2020-05-12 – 2020-05-13 (×2): 75 mg via ORAL
  Filled 2020-05-11 (×2): qty 2

## 2020-05-11 MED ORDER — SODIUM CHLORIDE 0.9 % IV SOLN: INTRAVENOUS | Status: DC

## 2020-05-11 MED ORDER — DICLOFENAC SODIUM 1 % TD GEL
2.0000 g | Freq: Four times a day (QID) | TRANSDERMAL | Status: DC | PRN
  Filled 2020-05-11: qty 100

## 2020-05-11 MED ORDER — LORATADINE 10 MG PO TABS
10.0000 mg | ORAL_TABLET | Freq: Every day | ORAL | Status: DC
  Administered 2020-05-12 – 2020-05-13 (×2): 10 mg via ORAL
  Filled 2020-05-11 (×2): qty 1

## 2020-05-11 MED ORDER — PROTHROMBIN COMPLEX CONC HUMAN 500 UNITS IV KIT: 50.0000 [IU]/kg | PACK | Status: DC

## 2020-05-11 MED ORDER — VITAMIN D 25 MCG (1000 UNIT) PO TABS
5000.0000 [IU] | ORAL_TABLET | ORAL | Status: DC
  Administered 2020-05-12: 5000 [IU] via ORAL
  Filled 2020-05-11: qty 5

## 2020-05-11 MED ORDER — PIPERACILLIN-TAZOBACTAM 3.375 G IVPB
3.3750 g | Freq: Three times a day (TID) | INTRAVENOUS | Status: DC
  Administered 2020-05-11 – 2020-05-13 (×6): 3.375 g via INTRAVENOUS
  Filled 2020-05-11 (×6): qty 50

## 2020-05-11 MED ORDER — ONDANSETRON HCL 4 MG/2ML IJ SOLN
4.0000 mg | Freq: Three times a day (TID) | INTRAMUSCULAR | Status: DC | PRN
  Administered 2020-05-11 – 2020-05-12 (×2): 4 mg via INTRAVENOUS
  Filled 2020-05-11 (×2): qty 2

## 2020-05-11 MED ORDER — MORPHINE SULFATE (PF) 2 MG/ML IV SOLN
2.0000 mg | INTRAVENOUS | Status: DC | PRN
  Administered 2020-05-11: 2 mg via INTRAVENOUS
  Filled 2020-05-11: qty 1

## 2020-05-11 MED ORDER — HYDRALAZINE HCL 20 MG/ML IJ SOLN: 5.0000 mg | INTRAMUSCULAR | Status: DC | PRN

## 2020-05-11 MED ORDER — HYDROMORPHONE HCL 1 MG/ML IJ SOLN
0.5000 mg | Freq: Once | INTRAMUSCULAR | Status: AC
  Administered 2020-05-11: 0.5 mg via INTRAVENOUS
  Filled 2020-05-11: qty 1

## 2020-05-11 MED ORDER — GABAPENTIN 300 MG PO CAPS
300.0000 mg | ORAL_CAPSULE | Freq: Two times a day (BID) | ORAL | Status: DC
  Administered 2020-05-11 – 2020-05-13 (×4): 300 mg via ORAL
  Filled 2020-05-11 (×4): qty 1

## 2020-05-11 MED ORDER — ENOXAPARIN SODIUM 40 MG/0.4ML ~~LOC~~ SOLN
40.0000 mg | SUBCUTANEOUS | Status: DC
  Administered 2020-05-11 – 2020-05-12 (×2): 40 mg via SUBCUTANEOUS
  Filled 2020-05-11 (×2): qty 0.4

## 2020-05-11 MED ORDER — LOSARTAN POTASSIUM 50 MG PO TABS
50.0000 mg | ORAL_TABLET | Freq: Every day | ORAL | Status: DC
  Administered 2020-05-12 – 2020-05-13 (×2): 50 mg via ORAL
  Filled 2020-05-11 (×2): qty 1

## 2020-05-11 MED ORDER — ACETAMINOPHEN 325 MG PO TABS
650.0000 mg | ORAL_TABLET | Freq: Four times a day (QID) | ORAL | Status: DC | PRN
  Administered 2020-05-13: 650 mg via ORAL
  Filled 2020-05-11: qty 2

## 2020-05-11 MED ORDER — IOHEXOL 300 MG/ML  SOLN
100.0000 mL | Freq: Once | INTRAMUSCULAR | Status: AC | PRN
  Administered 2020-05-11: 100 mL via INTRAVENOUS
  Filled 2020-05-11: qty 100

## 2020-05-11 MED ORDER — INSULIN ASPART 100 UNIT/ML ~~LOC~~ SOLN
0.0000 [IU] | Freq: Three times a day (TID) | SUBCUTANEOUS | Status: DC
  Administered 2020-05-11: 5 [IU] via SUBCUTANEOUS
  Administered 2020-05-12: 2 [IU] via SUBCUTANEOUS
  Administered 2020-05-12: 3 [IU] via SUBCUTANEOUS
  Administered 2020-05-12: 2 [IU] via SUBCUTANEOUS
  Filled 2020-05-11: qty 1

## 2020-05-11 MED ORDER — ALBUTEROL SULFATE (2.5 MG/3ML) 0.083% IN NEBU: 2.5000 mg | INHALATION_SOLUTION | RESPIRATORY_TRACT | Status: DC | PRN

## 2020-05-11 MED ORDER — PIPERACILLIN-TAZOBACTAM 3.375 G IVPB 30 MIN
3.3750 g | Freq: Once | INTRAVENOUS | Status: AC
  Administered 2020-05-11: 3.375 g via INTRAVENOUS
  Filled 2020-05-11: qty 50

## 2020-05-11 MED ORDER — ATORVASTATIN CALCIUM 10 MG PO TABS
10.0000 mg | ORAL_TABLET | Freq: Every day | ORAL | Status: DC
  Administered 2020-05-12 – 2020-05-13 (×2): 10 mg via ORAL
  Filled 2020-05-11 (×2): qty 1

## 2020-05-11 MED ORDER — ASPIRIN EC 81 MG PO TBEC
81.0000 mg | DELAYED_RELEASE_TABLET | Freq: Every day | ORAL | Status: DC
  Administered 2020-05-12 – 2020-05-13 (×2): 81 mg via ORAL
  Filled 2020-05-11 (×2): qty 1

## 2020-05-11 MED ORDER — INSULIN ASPART 100 UNIT/ML ~~LOC~~ SOLN
0.0000 [IU] | Freq: Every day | SUBCUTANEOUS | Status: DC
  Administered 2020-05-12: 4 [IU] via SUBCUTANEOUS

## 2020-05-11 NOTE — H&P (Signed)
History and Physical    [name] [id number] DOB: 1966/08/18 DOA: 05/11/2020  Referring MD/NP/PA:   PCP: Crystal Carls, MD   Patient coming from:  The patient is coming from home.  At baseline, pt is independent for most of ADL.        Chief Complaint: Abdominal pain  HPI: [name] is a 54 y.o. female with medical history significant of hypertension, hyperlipidemia, diabetes mellitus, asthma, GERD, depression, who presents with abdominal pain.  Patient states that her abdominal pain started yesterday, which is located in the left lower quadrant, initially 10 out of 10 in severity, sharp, radiating to the back.  Patient has nausea, no vomiting, diarrhea.  Denies fever or chills.  Patient has dysuria, no urinary frequency.  Denies chest pain, shortness breath and cough.  No unilateral weakness.   ED Course: pt was found to have WBC 18.7, negative urinalysis, negative COVID-19 PCR, lipase 19, electrolytes renal function okay, temperature normal, blood pressure 133/93, oxygen saturation 100% on room air.  Patient is placed on MedSurg bed for observation  # CT abdomen/pelvis showed: 1. Acute diverticulitis at the descending/ sigmoid junction. Phlegmonous inflammation without abscess formation. No free fluid or air. 2. 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   Review of Systems:   General: no fevers, chills, no body weight gain, has poor appetite, has fatigue HEENT: no blurry vision, hearing changes or sore throat Respiratory: no dyspnea, coughing, wheezing CV: no chest pain, no palpitations GI: has nausea, abdominal pain, no diarrhea, constipation, vomiting,  GU: no dysuria, burning on urination, increased urinary frequency, hematuria  Ext: no leg edema Neuro: no unilateral weakness, numbness, or tingling, no vision change or hearing loss Skin: no rash, no skin tear. MSK: No muscle spasm, no deformity, no  limitation of range of movement in spin Heme: No easy bruising.  Travel history: No recent long distant travel.  Allergy: No Known Allergies  Past Medical History:  Diagnosis Date  . Asthma   . Depression   . Diabetes mellitus without complication ([place])   . Hypertension     Past Surgical History:  Procedure Laterality Date  . ABDOMINAL HYSTERECTOMY    . TUBAL LIGATION      Social History:  reports that she quit smoking about 15 years ago. Her smoking use included cigarettes. She has a 30.00 pack-year smoking history. She has never used smokeless tobacco. She reports that she does not drink alcohol or use drugs.  Family History:  Family History  Problem Relation Age of Onset  . Hypertension Mother      Prior to Admission medications   Medication Sig Start Date End Date Taking? Authorizing Provider  albuterol (PROVENTIL HFA;VENTOLIN HFA) 108 (90 Base) MCG/ACT inhaler Inhale 1-2 puffs into the lungs every 6 (six) hours as needed for wheezing or shortness of breath. 01/28/19   Lule, Sara Chu, PA  albuterol (PROVENTIL) (2.5 MG/3ML) 0.083% nebulizer solution Take 3 mLs (2.5 mg total) by nebulization every 2 (two) hours as needed for wheezing or shortness of breath. 01/28/19   Ripley Fraise, PA  atorvastatin (LIPITOR) 10 MG tablet Take 10 mg by mouth daily.    [provider]  Cholecalciferol (VITAMIN D3) 1.25 MG (50000 UT) CAPS Take 1 capsule by mouth once a week. 02/09/20   [provider]  cyclobenzaprine (FLEXERIL) 10 MG tablet Take 1 tablet (10 mg total) by mouth 2 (two) times daily as needed for muscle  spasms. 01/28/19   Ripley Fraise, PA  diclofenac sodium (VOLTAREN) 1 % GEL Apply 2 g topically 4 (four) times daily as needed (pain). 01/28/19   Ripley Fraise, PA  gabapentin (NEURONTIN) 300 MG capsule Take 1 capsule (300 mg total) by mouth 2 (two) times daily. 01/28/19   Lule, Joana, PA  glipiZIDE (GLUCOTROL) 5 MG tablet Take 1 tablet (5 mg total) by mouth every morning. 01/28/19    Ripley Fraise, PA  hydrochlorothiazide (HYDRODIURIL) 25 MG tablet Take 25 mg by mouth daily. 02/09/20   [provider]  loratadine (CLARITIN) 10 MG tablet Take 1 tablet (10 mg total) by mouth daily. 01/28/19   Ripley Fraise, PA  losartan (COZAAR) 50 MG tablet Take 1 tablet (50 mg total) by mouth daily. 01/28/19   Ripley Fraise, PA  metFORMIN (GLUCOPHAGE) 1000 MG tablet Take 1 tablet (1,000 mg total) by mouth 2 (two) times daily with a meal. See discharge instructions for how to take medicine at first 01/28/19 03/01/20  Ripley Fraise, PA  omeprazole (PRILOSEC) 20 MG capsule Take 2 capsules (40 mg total) by mouth daily. 01/28/19   Ripley Fraise, PA  sertraline (ZOLOFT) 50 MG tablet Take 1.5 tablets (75 mg total) by mouth every morning. 01/28/19   Ripley Fraise, PA    Physical Exam: Vitals:   05/11/20 1121  BP: (!) 133/93  Resp: 18  Temp: 98.6 F (37 C)  TempSrc: Oral  SpO2: 100%  Weight: 78.5 kg  Height: 5\' 2"  (1.575 m)   General: Not in acute distress HEENT:       Eyes: PERRL, EOMI, no scleral icterus.       ENT: No discharge from the ears and nose, no pharynx injection, no tonsillar enlargement.        Neck: No JVD, no bruit, no mass felt. Heme: No neck lymph node enlargement. Cardiac: S1/S2, RRR, No murmurs, No gallops or rubs. Respiratory: No rales, wheezing, rhonchi or rubs. GI: Soft, nondistended, has tenderness in LLQ, no rebound pain, no organomegaly, BS present. GU: No hematuria Ext: No pitting leg edema bilaterally. 2+DP/PT pulse bilaterally. Musculoskeletal: No joint deformities, No joint redness or warmth, no limitation of ROM in spin. Skin: No rashes.  Neuro: Alert, oriented X3, cranial nerves II-XII grossly intact, moves all extremities normally.  Psych: Patient is not psychotic, no suicidal or hemocidal ideation.  Labs on Admission: I have personally reviewed following labs and imaging studies  CBC: Recent Labs  Lab 05/11/20 1123  WBC 18.7*  HGB 14.6  HCT 44.5  MCV 89.4    PLT 99991111   Basic Metabolic Panel: Recent Labs  Lab 05/11/20 1123  NA 135  K 3.5  CL 99  CO2 25  GLUCOSE 333*  BUN 11  CREATININE 0.90  CALCIUM 9.5   GFR: Estimated Creatinine Clearance: 70.2 mL/min (by C-G formula based on SCr of 0.9 mg/dL). Liver Function Tests: Recent Labs  Lab 05/11/20 1123  AST 18  ALT 25  ALKPHOS 124  BILITOT 1.6*  PROT 8.1  ALBUMIN 4.0   Recent Labs  Lab 05/11/20 1123  LIPASE 19   No results for input(s): AMMONIA in the last 168 hours. Coagulation Profile: No results for input(s): INR, PROTIME in the last 168 hours. Cardiac Enzymes: No results for input(s): CKTOTAL, CKMB, CKMBINDEX, TROPONINI in the last 168 hours. BNP (last 3 results) No results for input(s): PROBNP in the last 8760 hours. HbA1C: No results for input(s): HGBA1C in the last 72 hours. CBG: No results for  input(s): GLUCAP in the last 168 hours. Lipid Profile: No results for input(s): CHOL, HDL, LDLCALC, TRIG, CHOLHDL, LDLDIRECT in the last 72 hours. Thyroid Function Tests: No results for input(s): TSH, T4TOTAL, FREET4, T3FREE, THYROIDAB in the last 72 hours. Anemia Panel: No results for input(s): VITAMINB12, FOLATE, FERRITIN, TIBC, IRON, RETICCTPCT in the last 72 hours. Urine analysis:    Component Value Date/Time   COLORURINE YELLOW (A) 05/11/2020 1123   APPEARANCEUR HAZY (A) 05/11/2020 1123   APPEARANCEUR Cloudy (A) 02/22/2020 1334   LABSPEC 1.031 (H) 05/11/2020 1123   PHURINE 5.0 05/11/2020 1123   GLUCOSEU >=500 (A) 05/11/2020 1123   HGBUR NEGATIVE 05/11/2020 [place] 05/11/2020 1123   BILIRUBINUR Negative 02/22/2020 1334   KETONESUR 20 (A) 05/11/2020 1123   PROTEINUR 30 (A) 05/11/2020 1123   UROBILINOGEN 0.2 04/09/2015 1657   NITRITE NEGATIVE 05/11/2020 [place] 05/11/2020 1123   Sepsis Labs: @LABRCNTIP (procalcitonin:4,lacticidven:4) ) Recent Results (from the past 240 hour(s))  SARS Coronavirus 2 by RT PCR  (hospital order, performed in [place] hospital lab) Nasopharyngeal Nasopharyngeal Swab     Status: None   Collection Time: 05/11/20  2:52 PM   Specimen: Nasopharyngeal Swab  Result Value Ref Range Status   SARS Coronavirus 2 NEGATIVE NEGATIVE Final    Comment: (NOTE) SARS-CoV-2 target nucleic acids are NOT DETECTED. The SARS-CoV-2 RNA is generally detectable in upper and lower respiratory specimens during the acute phase of infection. The lowest concentration of SARS-CoV-2 viral copies this assay can detect is 250 copies / mL. A negative result does not preclude SARS-CoV-2 infection and should not be used as the sole basis for treatment or other patient management decisions.  A negative result may occur with improper specimen collection / handling, submission of specimen other than nasopharyngeal swab, presence of viral mutation(s) within the areas targeted by this assay, and inadequate number of viral copies (<250 copies / mL). A negative result must be combined with clinical observations, patient history, and epidemiological information. Fact Sheet for Patients:   StrictlyIdeas.no Fact Sheet for Healthcare Providers: BankingDealers.co.za This test is not yet approved or cleared  by the [place] FDA and has been authorized for detection and/or diagnosis of SARS-CoV-2 by FDA under an Emergency Use Authorization (EUA).  This EUA will remain in effect (meaning this test can be used) for the duration of the COVID-19 declaration under Section 564(b)(1) of the Act, 21 U.S.C. section 360bbb-3(b)(1), unless the authorization is terminated or revoked sooner. Performed at [facility], [address], [place], [place] [address]      Radiological Exams on Admission: CT ABDOMEN PELVIS W CONTRAST  Result Date: 05/11/2020 CLINICAL DATA:  Abdominal distension. Left lower quadrant pain and elevated white count. EXAM: CT ABDOMEN  AND PELVIS WITH CONTRAST TECHNIQUE: Multidetector CT imaging of the abdomen and pelvis was performed using the standard protocol following bolus administration of intravenous contrast. CONTRAST:  163mL OMNIPAQUE IOHEXOL 300 MG/ML  SOLN COMPARISON:  01/19/2020 FINDINGS: Lower chest: Negative . Hepatobiliary: scattered benign appearing low densities in the liver, 3-5 mm in size, unchanged. No calcified gallstones Pancreas: Normal Spleen: Normal. Adrenals/Urinary Tract: No change in a left adrenal mass with calcifications measuring up to 2.9 cm in size. This is stable since 2016. nonobstructing 3 mm stone in the upper pole of the left kidney. 1 mm nonobstructing stone in the lower pole the left kidney. 1.9 cm cyst in the midportion of the left kidney. Bladder is normal. Stomach/Bowel: Stomach  and small intestine are normal. Patient has diverticulosis of the colon, with acute diverticulitis at the descending/ sigmoid junction. Phlegmonous inflammation without evidence of abscess. No free fluid or air. Vascular/Lymphatic: Aorta is normal. IVC is normal. No retroperitoneal adenopathy. Reproductive: Previous hysterectomy.  No pelvic mass. Other: No free fluid or air. Musculoskeletal: Normal IMPRESSION: Acute diverticulitis at the descending/ sigmoid junction. Phlegmonous inflammation without abscess formation. No free fluid or air. 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 Electronically Signed   By: Nelson Chimes M.D.   On: 05/11/2020 13:57     EKG:  Not done in ED  Assessment/Plan Principal Problem:   Acute diverticulitis Active Problems:   Asthma   Essential hypertension   Diabetes mellitus without complication (HCC)   Depression   HLD (hyperlipidemia)   GERD (gastroesophageal reflux disease)   Acute diverticulitis: Patient has leukocytosis, no fever.  Clinically not septic.  Hemodynamically stable. -will place on med-surg bed for obs -IV Zosyn -As  needed morphine and Percocet for pain -As needed Zofran for nausea vomiting -IV fluid: 100 cc/h normal saline  Asthma: stable -prn abluterol  HTN:  -Continue home medications: Cozaar -Hold HCTZ since patient need IV fluid -hydralazine prn  Diabetes mellitus without complication ([place]): [id number] on 01/28/2012,  Poorly controlled.  Patient is taking Metformin and glipizide -Sliding scale insulin  Depression: No suicidal homicidal ideations -Zoloft  HLD (hyperlipidemia) -Lipitor  GERD (gastroesophageal reflux disease) -Protonix      DVT ppx: SQ Lovenox Code Status: Full code Family Communication: not done, no family member is at bed side.     Disposition Plan:  Anticipate discharge back to previous home environment Consults called:  none Admission status: Med-surg bed for obs          Status is: Observation  The patient remains OBS appropriate and will d/c before 2 midnights.  Dispo: The patient is from: Home              Anticipated d/c is to: Home              Anticipated d/c date is: 1 day              Patient currently is not medically stable to d/c.           Date of Service 05/11/2020    Ivor Costa [facility]   If 7PM-7AM, please contact night-coverage [email or web address] 05/11/2020, 4:46 PM

## 2020-05-11 NOTE — ED Triage Notes (Signed)
Pt reports that she began having left lower abd pain this am with urinary sx's. Also reports pain to lower back around "butt bone".

## 2020-05-11 NOTE — ED Notes (Signed)
fsbs 266

## 2020-05-11 NOTE — ED Provider Notes (Signed)
[facility] Emergency Department Provider Note  ____________________________________________   First MD Initiated Contact with Patient 05/11/20 1324     (approximate)  I have reviewed the triage vital signs and the nursing notes.   HISTORY  Chief Complaint Abdominal Pain, Back Pain, and Dysuria    HPI ADEOLA DASHNAW is a 54 y.o. female with asthma, depression, diabetes who comes in with lower abdominal pain.  Patient reports lower abdominal pain that started yesterday, most notably over the left lower quadrant, constant, nothing makes it better, nothing makes it worse.  States that she does have some pain radiating to her back a little bit of discomfort with urination          Past Medical History:  Diagnosis Date  . Asthma   . Depression   . Diabetes mellitus without complication ([place])   . Hypertension     Patient Active Problem List   Diagnosis Date Noted  . Influenza A 01/26/2019  . Acute renal failure (ARF) ([place]) 01/26/2019  . Marijuana use 06/12/2017  . Acute pain of right knee 10/14/2016  . Diabetic neuropathy ([place]) 01/07/2016  . Plantar fasciitis of left foot 11/26/2015  . Back pain 10/10/2015  . Asthma 09/29/2015  . Essential hypertension 09/29/2015  . Diabetes mellitus ([place]) 09/29/2015    Past Surgical History:  Procedure Laterality Date  . ABDOMINAL HYSTERECTOMY    . TUBAL LIGATION      Prior to Admission medications   Medication Sig Start Date End Date Taking? Authorizing Provider  albuterol (PROVENTIL HFA;VENTOLIN HFA) 108 (90 Base) MCG/ACT inhaler Inhale 1-2 puffs into the lungs every 6 (six) hours as needed for wheezing or shortness of breath. 01/28/19   Lule, Sara Chu, PA  albuterol (PROVENTIL) (2.5 MG/3ML) 0.083% nebulizer solution Take 3 mLs (2.5 mg total) by nebulization every 2 (two) hours as needed for wheezing or shortness of breath. 01/28/19   Ripley Fraise, PA  atorvastatin (LIPITOR) 10 MG tablet Take 10 mg by mouth  daily.    [provider]  Cholecalciferol (VITAMIN D3) 1.25 MG (50000 UT) CAPS Take 1 capsule by mouth once a week. 02/09/20   [provider]  cyclobenzaprine (FLEXERIL) 10 MG tablet Take 1 tablet (10 mg total) by mouth 2 (two) times daily as needed for muscle spasms. 01/28/19   Ripley Fraise, PA  diclofenac sodium (VOLTAREN) 1 % GEL Apply 2 g topically 4 (four) times daily as needed (pain). 01/28/19   Ripley Fraise, PA  gabapentin (NEURONTIN) 300 MG capsule Take 1 capsule (300 mg total) by mouth 2 (two) times daily. 01/28/19   Lule, Joana, PA  glipiZIDE (GLUCOTROL) 5 MG tablet Take 1 tablet (5 mg total) by mouth every morning. 01/28/19   Ripley Fraise, PA  hydrochlorothiazide (HYDRODIURIL) 25 MG tablet Take 25 mg by mouth daily. 02/09/20   [provider]  loratadine (CLARITIN) 10 MG tablet Take 1 tablet (10 mg total) by mouth daily. 01/28/19   Ripley Fraise, PA  losartan (COZAAR) 50 MG tablet Take 1 tablet (50 mg total) by mouth daily. 01/28/19   Ripley Fraise, PA  metFORMIN (GLUCOPHAGE) 1000 MG tablet Take 1 tablet (1,000 mg total) by mouth 2 (two) times daily with a meal. See discharge instructions for how to take medicine at first 01/28/19 03/01/20  Ripley Fraise, PA  omeprazole (PRILOSEC) 20 MG capsule Take 2 capsules (40 mg total) by mouth daily. 01/28/19   Ripley Fraise, PA  sertraline (ZOLOFT) 50 MG tablet Take 1.5 tablets (  75 mg total) by mouth every morning. 01/28/19   Ripley Fraise, PA    Allergies Patient has no known allergies.  Family History  Problem Relation Age of Onset  . Hypertension Mother     Social History Social History   Tobacco Use  . Smoking status: Former Smoker    Packs/day: 3.00    Years: 10.00    Pack years: 30.00    Types: Cigarettes    Quit date: 12/22/2004    Years since quitting: 15.3  . Smokeless tobacco: Never Used  Substance Use Topics  . Alcohol use: No  . Drug use: No      Review of Systems Constitutional: No fever/chills Eyes: No visual  changes. ENT: No sore throat. Cardiovascular: Denies chest pain. Respiratory: Denies shortness of breath. Gastrointestinal: Positive abdominal pain no nausea, no vomiting.  No diarrhea.  No constipation. Genitourinary: Positive dysuria Musculoskeletal: Negative for back pain. Skin: Negative for rash. Neurological: Negative for headaches, focal weakness or numbness. All other ROS negative ____________________________________________   PHYSICAL EXAM:  VITAL SIGNS: ED Triage Vitals [05/11/20 1121]  Enc Vitals Group     BP (!) 133/93     Pulse      Resp 18     Temp 98.6 F (37 C)     Temp Source Oral     SpO2 100 %     Weight 173 lb (78.5 kg)     Height 5\' 2"  (1.575 m)     Head Circumference      Peak Flow      Pain Score 9     Pain Loc      Pain Edu?      Excl. in [place]?     Constitutional: Alert and oriented. Well appearing and in no acute distress. Eyes: Conjunctivae are normal. EOMI. Head: Atraumatic. Nose: No congestion/rhinnorhea. Mouth/Throat: Mucous membranes are moist.   Neck: No stridor. Trachea Midline. FROM Cardiovascular: Normal rate, regular rhythm. Grossly normal heart sounds.  Good peripheral circulation. Respiratory: Normal respiratory effort.  No retractions. Lungs CTAB. Gastrointestinal: Tender to the left lower quadrant.  No distention. No abdominal bruits.  Musculoskeletal: No lower extremity tenderness nor edema.  No joint effusions. Neurologic:  Normal speech and language. No gross focal neurologic deficits are appreciated.  Skin:  Skin is warm, dry and intact. No rash noted. Psychiatric: Mood and affect are normal. Speech and behavior are normal. GU: Deferred   ____________________________________________   LABS (all labs ordered are listed, but only abnormal results are displayed)  Labs Reviewed  COMPREHENSIVE METABOLIC PANEL - Abnormal; Notable for the following components:      Result Value   Glucose, Bld 333 (*)    Total Bilirubin 1.6  (*)    All other components within normal limits  CBC - Abnormal; Notable for the following components:   WBC 18.7 (*)    All other components within normal limits  URINALYSIS, COMPLETE (UACMP) WITH MICROSCOPIC - Abnormal; Notable for the following components:   Color, Urine YELLOW (*)    APPearance HAZY (*)    Specific Gravity, Urine 1.031 (*)    Glucose, UA >=500 (*)    Ketones, ur 20 (*)    Protein, ur 30 (*)    All other components within normal limits  LIPASE, BLOOD   ____________________________________________  RADIOLOGY   Official radiology report(s): CT ABDOMEN PELVIS W CONTRAST  Result Date: 05/11/2020 CLINICAL DATA:  Abdominal distension. Left lower quadrant pain and elevated white count. EXAM: CT  ABDOMEN AND PELVIS WITH CONTRAST TECHNIQUE: Multidetector CT imaging of the abdomen and pelvis was performed using the standard protocol following bolus administration of intravenous contrast. CONTRAST:  14mL OMNIPAQUE IOHEXOL 300 MG/ML  SOLN COMPARISON:  01/19/2020 FINDINGS: Lower chest: Negative . Hepatobiliary: scattered benign appearing low densities in the liver, 3-5 mm in size, unchanged. No calcified gallstones Pancreas: Normal Spleen: Normal. Adrenals/Urinary Tract: No change in a left adrenal mass with calcifications measuring up to 2.9 cm in size. This is stable since 2016. nonobstructing 3 mm stone in the upper pole of the left kidney. 1 mm nonobstructing stone in the lower pole the left kidney. 1.9 cm cyst in the midportion of the left kidney. Bladder is normal. Stomach/Bowel: Stomach and small intestine are normal. Patient has diverticulosis of the colon, with acute diverticulitis at the descending/ sigmoid junction. Phlegmonous inflammation without evidence of abscess. No free fluid or air. Vascular/Lymphatic: Aorta is normal. IVC is normal. No retroperitoneal adenopathy. Reproductive: Previous hysterectomy.  No pelvic mass. Other: No free fluid or air. Musculoskeletal:  Normal IMPRESSION: Acute diverticulitis at the descending/ sigmoid junction. Phlegmonous inflammation without abscess formation. No free fluid or air. 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 Electronically Signed   By: Nelson Chimes M.D.   On: 05/11/2020 13:57    ____________________________________________   PROCEDURES  Procedure(s) performed (including Critical Care):  Procedures   ____________________________________________   INITIAL IMPRESSION / ASSESSMENT AND PLAN / ED COURSE  ANALICIA BARTOLUCCI was evaluated in Emergency Department on 05/11/2020 for the symptoms described in the history of present illness. She was evaluated in the context of the global COVID-19 pandemic, which necessitated consideration that the patient might be at risk for infection with the SARS-CoV-2 virus that causes COVID-19. Institutional protocols and algorithms that pertain to the evaluation of patients at risk for COVID-19 are in a state of rapid change based on information released by regulatory bodies including the CDC and federal and state organizations. These policies and algorithms were followed during the patient's care in the ED.    Patient is a well-appearing 54 year old who comes in with lower abdominal pain on the left side.  Patient denies any history of diverticulitis.  Will get CT scan to evaluate for diverticulitis, abscess, perforation, SBO.  Urine evaluate for UTI.  Will give some IV Dilaudid and Zofran given pain is not better with the oxycodone given earlier   Labs are reassuring although she has a white count of 18.7 which is concerning for infection.  UA is without evidence of UTI.  CT scan consistent with diverticulitis with phlegmon which is technically categorizes this as a complicated diverticulitis which I recommend admission for.  Will start patient on Zosyn.  Patient is comfortable with being admitted.  Will discuss with hospital team  for admission       ____________________________________________   FINAL CLINICAL IMPRESSION(S) / ED DIAGNOSES   Final diagnoses:  Diverticulitis  Phlegmon      MEDICATIONS GIVEN DURING THIS VISIT:  Medications  oxyCODONE-acetaminophen (PERCOCET/ROXICET) 5-325 MG per tablet 1 tablet (1 tablet Oral Given 05/11/20 1133)  piperacillin-tazobactam (ZOSYN) IVPB 3.375 g (3.375 g Intravenous New Bag/Given 05/11/20 1432)  ondansetron (ZOFRAN) injection 4 mg (has no administration in time range)  morphine 2 MG/ML injection 2 mg (has no administration in time range)  acetaminophen (TYLENOL) tablet 650 mg (has no administration in time range)  hydrALAZINE (APRESOLINE) injection 5 mg (has no administration in time range)  HYDROmorphone (DILAUDID) injection 0.5 mg (0.5 mg Intravenous Given 05/11/20 1355)  ondansetron (ZOFRAN) injection 4 mg (4 mg Intravenous Given 05/11/20 1355)  iohexol (OMNIPAQUE) 300 MG/ML solution 100 mL (100 mLs Intravenous Contrast Given 05/11/20 1343)     ED Discharge Orders    None       Note:  This document was prepared using Dragon voice recognition software and may include unintentional dictation errors.   Vanessa Banks, MD 05/11/20 [phone]

## 2020-05-11 NOTE — ED Notes (Signed)
Pain meds given.  Iv fluids infusing.

## 2020-05-11 NOTE — ED Notes (Signed)
Report called to [place] rn floor nurse.

## 2020-05-11 NOTE — Progress Notes (Signed)
Pharmacy Antibiotic Note  [name] is a 54 y.o. female admitted on 05/11/2020 with diverticulitis.  Pharmacy has been consulted for Zosyn dosing.  Plan: Zosyn 3.375g IV q8h (4 hour infusion).  Height: 5\' 2"  (157.5 cm) Weight: 78.5 kg (173 lb) IBW/kg (Calculated) : 50.1  Temp (24hrs), Avg:98.6 F (37 C), Min:98.6 F (37 C), Max:98.6 F (37 C)  Recent Labs  Lab 05/11/20 1123  WBC 18.7*  CREATININE 0.90    Estimated Creatinine Clearance: 70.2 mL/min (by C-G formula based on SCr of 0.9 mg/dL).    No Known Allergies  Antimicrobials this admission: Zosyn 5/21 >>  Dose adjustments this admission:   Microbiology results: 5/21 BCx: collected    Thank you for allowing pharmacy to be a part of this patient's care.  Rocky Morel 05/11/2020 3:01 PM

## 2020-05-11 NOTE — Plan of Care (Signed)
Patient is alert and oriented, complaining of 10 out of 10 pain in left lower abdomen. Given percocet for pain relief.  Will continue to monitor.  Christene Slates

## 2020-05-12 DIAGNOSIS — Z8249 Family history of ischemic heart disease and other diseases of the circulatory system: Secondary | ICD-10-CM | POA: Diagnosis not present

## 2020-05-12 DIAGNOSIS — I10 Essential (primary) hypertension: Secondary | ICD-10-CM | POA: Diagnosis present

## 2020-05-12 DIAGNOSIS — K5732 Diverticulitis of large intestine without perforation or abscess without bleeding: Secondary | ICD-10-CM

## 2020-05-12 DIAGNOSIS — E785 Hyperlipidemia, unspecified: Secondary | ICD-10-CM | POA: Diagnosis present

## 2020-05-12 DIAGNOSIS — K219 Gastro-esophageal reflux disease without esophagitis: Secondary | ICD-10-CM | POA: Diagnosis present

## 2020-05-12 DIAGNOSIS — E1140 Type 2 diabetes mellitus with diabetic neuropathy, unspecified: Secondary | ICD-10-CM | POA: Diagnosis present

## 2020-05-12 DIAGNOSIS — L0291 Cutaneous abscess, unspecified: Secondary | ICD-10-CM | POA: Diagnosis present

## 2020-05-12 DIAGNOSIS — Z90710 Acquired absence of both cervix and uterus: Secondary | ICD-10-CM | POA: Diagnosis not present

## 2020-05-12 DIAGNOSIS — K5792 Diverticulitis of intestine, part unspecified, without perforation or abscess without bleeding: Secondary | ICD-10-CM | POA: Diagnosis not present

## 2020-05-12 DIAGNOSIS — J45909 Unspecified asthma, uncomplicated: Secondary | ICD-10-CM | POA: Diagnosis present

## 2020-05-12 DIAGNOSIS — Z87891 Personal history of nicotine dependence: Secondary | ICD-10-CM | POA: Diagnosis not present

## 2020-05-12 DIAGNOSIS — E1165 Type 2 diabetes mellitus with hyperglycemia: Secondary | ICD-10-CM | POA: Diagnosis present

## 2020-05-12 DIAGNOSIS — Z20822 Contact with and (suspected) exposure to covid-19: Secondary | ICD-10-CM | POA: Diagnosis present

## 2020-05-12 DIAGNOSIS — F329 Major depressive disorder, single episode, unspecified: Secondary | ICD-10-CM | POA: Diagnosis present

## 2020-05-12 DIAGNOSIS — Z7984 Long term (current) use of oral hypoglycemic drugs: Secondary | ICD-10-CM | POA: Diagnosis not present

## 2020-05-12 DIAGNOSIS — Z79899 Other long term (current) drug therapy: Secondary | ICD-10-CM | POA: Diagnosis not present

## 2020-05-12 HISTORY — DX: Diverticulitis of large intestine without perforation or abscess without bleeding: K57.32

## 2020-05-12 LAB — CBC
HCT: 38.4 % (ref 36.0–46.0)
Hemoglobin: 12.4 g/dL (ref 12.0–15.0)
MCH: 29.2 pg (ref 26.0–34.0)
MCHC: 32.3 g/dL (ref 30.0–36.0)
MCV: 90.4 fL (ref 80.0–100.0)
Platelets: 215 10*3/uL (ref 150–400)
RBC: 4.25 MIL/uL (ref 3.87–5.11)
RDW: 14.6 % (ref 11.5–15.5)
WBC: 13 10*3/uL — ABNORMAL HIGH (ref 4.0–10.5)
nRBC: 0.2 % (ref 0.0–0.2)

## 2020-05-12 LAB — GLUCOSE, CAPILLARY
Glucose-Capillary: 199 mg/dL — ABNORMAL HIGH (ref 70–99)
Glucose-Capillary: 217 mg/dL — ABNORMAL HIGH (ref 70–99)
Glucose-Capillary: 184 mg/dL — ABNORMAL HIGH (ref 70–99)
Glucose-Capillary: 317 mg/dL — ABNORMAL HIGH (ref 70–99)

## 2020-05-12 LAB — BASIC METABOLIC PANEL
Anion gap: 9 (ref 5–15)
BUN: 11 mg/dL (ref 6–20)
CO2: 21 mmol/L — ABNORMAL LOW (ref 22–32)
Calcium: 8.4 mg/dL — ABNORMAL LOW (ref 8.9–10.3)
Chloride: 104 mmol/L (ref 98–111)
Creatinine, Ser: 0.77 mg/dL (ref 0.44–1.00)
GFR calc Af Amer: >60 mL/min (ref >60)
GFR calc non Af Amer: >60 mL/min (ref >60)
Glucose, Bld: 167 mg/dL — ABNORMAL HIGH (ref 70–99)
Potassium: 4.4 mmol/L (ref 3.5–5.1)
Sodium: 134 mmol/L — ABNORMAL LOW (ref 135–145)

## 2020-05-12 LAB — HIV ANTIBODY (ROUTINE TESTING W REFLEX): HIV Screen 4th Generation wRfx: NONREACTIVE

## 2020-05-12 LAB — PREGNANCY, URINE: Preg Test, Ur: NEGATIVE

## 2020-05-12 MED ORDER — POLYETHYLENE GLYCOL 3350 17 G PO PACK
17.0000 g | PACK | Freq: Every day | ORAL | Status: DC
  Administered 2020-05-12 – 2020-05-13 (×2): 17 g via ORAL
  Filled 2020-05-12 (×2): qty 1

## 2020-05-12 MED ORDER — OXYCODONE-ACETAMINOPHEN 5-325 MG PO TABS
1.0000 | ORAL_TABLET | Freq: Four times a day (QID) | ORAL | Status: DC | PRN
  Administered 2020-05-12 – 2020-05-13 (×5): 1 via ORAL
  Filled 2020-05-12 (×5): qty 1

## 2020-05-12 MED ORDER — BISACODYL 10 MG RE SUPP
10.0000 mg | Freq: Once | RECTAL | Status: AC
  Administered 2020-05-12: 10 mg via RECTAL
  Filled 2020-05-12: qty 1

## 2020-05-12 MED ORDER — MAGNESIUM HYDROXIDE 400 MG/5ML PO SUSP
30.0000 mL | Freq: Every day | ORAL | Status: DC | PRN
  Administered 2020-05-12 – 2020-05-13 (×2): 30 mL via ORAL
  Filled 2020-05-12 (×2): qty 30

## 2020-05-12 NOTE — Progress Notes (Signed)
[place] visited pt. per OR for AD education.  Pt. in bed lying down; said she has an infection of the intestines and is in a good deal of pain (7/10); she also says she is hungry and dissatisfied with her liquid diet --also acknowledges eating solid food is painful.  Pt. not aware of AD request but asked for a copy of document to peruse.  Pt. does NOT want her children making decisions for her --[facility] advises Pt. complete HCPOA paperwork to avoid this happening.  Pt. will contact chaplains if she needs help finalizing form.  No further needs at this time.    05/12/20 0110  Clinical Encounter Type  Visited With Patient  Visit Type Initial;Social support;Psychological support (Advanced Directive education)  Referral From Nurse  Consult/Referral To Chaplain  Spiritual Encounters  Spiritual Needs Emotional  Stress Factors  Patient Stress Factors Family relationships;Health changes;Loss of control (Pain)

## 2020-05-12 NOTE — Progress Notes (Signed)
PROGRESS NOTE    [name]  [id number] DOB: June 12, 1966 DOA: 05/11/2020 PCP: [name], MD   Brief Narrative:  [name] is a 54 y.o. female with medical history significant of hypertension, hyperlipidemia, diabetes mellitus, asthma, GERD, depression, who presents with abdominal pain. CT abdomen concerning for acute diverticulitis at the descending/sigmoid junction.  She was started on Zosyn.  Subjective: Patient was complaining of left lower quadrant abdominal pain, 8/10.  Pain increases with food.  She did not had bowel movement since Wednesday.  Denies any nausea or vomiting.  Assessment & Plan:   Principal Problem:   Acute diverticulitis Active Problems:   Asthma   Essential hypertension   Diabetes mellitus without complication (HCC)   Depression   HLD (hyperlipidemia)   GERD (gastroesophageal reflux disease)   Diverticulitis large intestine  Acute diverticulitis.  Patient remained afebrile with improving leukocytosis.  Hemodynamically stable.  History of chronic constipation, last bowel movement on Wednesday. -Continue Zosyn for now-can be discharged home on Cipro and Flagyl to complete a 7-day course. -Continue with pain management. -Add regular bowel regimen.  HTN:  -Continue home medications: Cozaar -Hold HCTZ since patient needs IV fluid -hydralazine prn  Diabetes mellitus without complication ([place]): [id number] [phone] on 01/27/2019,  Poorly controlled.  Patient is taking Metformin and glipizide -Sliding scale insulin  Depression: No suicidal homicidal ideations -Zoloft  HLD (hyperlipidemia) -Lipitor  GERD (gastroesophageal reflux disease) -Protonix  Objective: Vitals:   05/12/20 0010 05/12/20 0441 05/12/20 0623 05/12/20 1216  BP: 128/90 121/85  (!) 131/98  Pulse: 88 85  100  Resp: 20 20  18   Temp: 98.4 F (36.9 C) 98.1 F (36.7 C)  98.4 F (36.9 C)  TempSrc: Oral Oral  Oral  SpO2: 100% 98%  99%  Weight:   78.7 kg   Height:         Intake/Output Summary (Last 24 hours) at 05/12/2020 1421 Last data filed at 05/12/2020 0950 Gross per 24 hour  Intake 1482.01 ml  Output --  Net 1482.01 ml   Filed Weights   05/11/20 1121 05/12/20 0623  Weight: 78.5 kg 78.7 kg    Examination:  General exam: Appears calm and comfortable  Respiratory system: Clear to auscultation. Respiratory effort normal. Cardiovascular system: S1 & S2 heard, RRR. No JVD, murmurs, rubs, gallops or clicks. Gastrointestinal system: Soft, left lower quadrant tenderness, nondistended, bowel sounds positive. Central nervous system: Alert and oriented. No focal neurological deficits.Symmetric 5 x 5 power. Extremities: No edema, no cyanosis, pulses intact and symmetrical. Skin: No rashes, lesions or ulcers Psychiatry: Judgement and insight appear normal. Mood & affect appropriate.    DVT prophylaxis: Lovenox Code Status: Full Family Communication: Discussed with patient Disposition Plan:  Status is: Inpatient  Remains inpatient appropriate because:Ongoing active pain requiring inpatient pain management   Dispo: The patient is from: Home              Anticipated d/c is to: Home              Anticipated d/c date is: 1 day              Patient currently is not medically stable to d/c.  Patient continued to require IV pain meds.  Working on bowel regimen, hopefully should be able to go tomorrow on p.o. antibiotics.  Consultants:   None  Procedures:  Antimicrobials:  Zosyn  Data Reviewed: I have personally reviewed following labs and imaging studies  CBC: Recent Labs  Lab 05/11/20  1123 05/12/20 0432  WBC 18.7* 13.0*  HGB 14.6 12.4  HCT 44.5 38.4  MCV 89.4 90.4  PLT 263 [id number]   Basic Metabolic Panel: Recent Labs  Lab 05/11/20 1123 05/12/20 0432  NA 135 134*  K 3.5 4.4  CL 99 104  CO2 25 21*  GLUCOSE 333* 167*  BUN 11 11  CREATININE 0.90 0.77  CALCIUM 9.5 8.4*   GFR: Estimated Creatinine Clearance: 79 mL/min (by C-G  formula based on SCr of 0.77 mg/dL). Liver Function Tests: Recent Labs  Lab 05/11/20 1123  AST 18  ALT 25  ALKPHOS 124  BILITOT 1.6*  PROT 8.1  ALBUMIN 4.0   Recent Labs  Lab 05/11/20 1123  LIPASE 19   No results for input(s): AMMONIA in the last 168 hours. Coagulation Profile: No results for input(s): INR, PROTIME in the last 168 hours. Cardiac Enzymes: No results for input(s): CKTOTAL, CKMB, CKMBINDEX, TROPONINI in the last 168 hours. BNP (last 3 results) No results for input(s): PROBNP in the last 8760 hours. HbA1C: No results for input(s): HGBA1C in the last 72 hours. CBG: Recent Labs  Lab 05/11/20 1803 05/11/20 2204 05/12/20 0819 05/12/20 1214  GLUCAP 266* 144* 199* 217*   Lipid Profile: No results for input(s): CHOL, HDL, LDLCALC, TRIG, CHOLHDL, LDLDIRECT in the last 72 hours. Thyroid Function Tests: No results for input(s): TSH, T4TOTAL, FREET4, T3FREE, THYROIDAB in the last 72 hours. Anemia Panel: No results for input(s): VITAMINB12, FOLATE, FERRITIN, TIBC, IRON, RETICCTPCT in the last 72 hours. Sepsis Labs: No results for input(s): PROCALCITON, LATICACIDVEN in the last 168 hours.  Recent Results (from the past 240 hour(s))  SARS Coronavirus 2 by RT PCR (hospital order, performed in [facility] hospital lab) Nasopharyngeal Nasopharyngeal Swab     Status: None   Collection Time: 05/11/20  2:52 PM   Specimen: Nasopharyngeal Swab  Result Value Ref Range Status   SARS Coronavirus 2 NEGATIVE NEGATIVE Final    Comment: (NOTE) SARS-CoV-2 target nucleic acids are NOT DETECTED. The SARS-CoV-2 RNA is generally detectable in upper and lower respiratory specimens during the acute phase of infection. The lowest concentration of SARS-CoV-2 viral copies this assay can detect is 250 copies / mL. A negative result does not preclude SARS-CoV-2 infection and should not be used as the sole basis for treatment or other patient management decisions.  A negative result may  occur with improper specimen collection / handling, submission of specimen other than nasopharyngeal swab, presence of viral mutation(s) within the areas targeted by this assay, and inadequate number of viral copies (<250 copies / mL). A negative result must be combined with clinical observations, patient history, and epidemiological information. Fact Sheet for Patients:   StrictlyIdeas.no Fact Sheet for Healthcare Providers: BankingDealers.co.za This test is not yet approved or cleared  by the [place] FDA and has been authorized for detection and/or diagnosis of SARS-CoV-2 by FDA under an Emergency Use Authorization (EUA).  This EUA will remain in effect (meaning this test can be used) for the duration of the COVID-19 declaration under Section 564(b)(1) of the Act, 21 U.S.C. section 360bbb-3(b)(1), unless the authorization is terminated or revoked sooner. Performed at [facility], [address] [place], [place] [address]   CULTURE, BLOOD (ROUTINE X 2) w Reflex to ID Panel     Status: None (Preliminary result)   Collection Time: 05/11/20  2:52 PM   Specimen: BLOOD  Result Value Ref Range Status   Specimen Description BLOOD Blood Culture adequate volume  Final   Special Requests   Final    BOTTLES DRAWN AEROBIC AND ANAEROBIC RIGHT ANTECUBITAL   Culture   Final    NO GROWTH < 24 HOURS Performed at [facility], [address] [place], [place] [address]    Report Status PENDING  Incomplete  CULTURE, BLOOD (ROUTINE X 2) w Reflex to ID Panel     Status: None (Preliminary result)   Collection Time: 05/11/20  2:53 PM   Specimen: BLOOD  Result Value Ref Range Status   Specimen Description BLOOD  Final   Special Requests NONE  Final   Culture   Final    NO GROWTH < 24 HOURS Performed at [facility], [address] [place], [place] [address]    Report Status PENDING  Incomplete     Radiology  Studies: CT ABDOMEN PELVIS W CONTRAST  Result Date: 05/11/2020 CLINICAL DATA:  Abdominal distension. Left lower quadrant pain and elevated white count. EXAM: CT ABDOMEN AND PELVIS WITH CONTRAST TECHNIQUE: Multidetector CT imaging of the abdomen and pelvis was performed using the standard protocol following bolus administration of intravenous contrast. CONTRAST:  173mL OMNIPAQUE IOHEXOL 300 MG/ML  SOLN COMPARISON:  01/19/2020 FINDINGS: Lower chest: Negative . Hepatobiliary: scattered benign appearing low densities in the liver, 3-5 mm in size, unchanged. No calcified gallstones Pancreas: Normal Spleen: Normal. Adrenals/Urinary Tract: No change in a left adrenal mass with calcifications measuring up to 2.9 cm in size. This is stable since 2016. nonobstructing 3 mm stone in the upper pole of the left kidney. 1 mm nonobstructing stone in the lower pole the left kidney. 1.9 cm cyst in the midportion of the left kidney. Bladder is normal. Stomach/Bowel: Stomach and small intestine are normal. Patient has diverticulosis of the colon, with acute diverticulitis at the descending/ sigmoid junction. Phlegmonous inflammation without evidence of abscess. No free fluid or air. Vascular/Lymphatic: Aorta is normal. IVC is normal. No retroperitoneal adenopathy. Reproductive: Previous hysterectomy.  No pelvic mass. Other: No free fluid or air. Musculoskeletal: Normal IMPRESSION: Acute diverticulitis at the descending/ sigmoid junction. Phlegmonous inflammation without abscess formation. No free fluid or air. 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 Electronically Signed   By: Nelson Chimes M.D.   On: 05/11/2020 13:57    Scheduled Meds: . aspirin EC  81 mg Oral Daily  . atorvastatin  10 mg Oral Daily  . cholecalciferol  5,000 Units Oral Weekly  . enoxaparin (LOVENOX) injection  40 mg Subcutaneous Q24H  . gabapentin  300 mg Oral BID  . insulin aspart  0-5 Units  Subcutaneous QHS  . insulin aspart  0-9 Units Subcutaneous TID WC  . loratadine  10 mg Oral Daily  . losartan  50 mg Oral Daily  . pantoprazole  40 mg Oral Daily  . polyethylene glycol  17 g Oral Daily  . sertraline  75 mg Oral [id number]   Continuous Infusions: . sodium chloride 100 mL/hr at 05/12/20 0413  . piperacillin-tazobactam (ZOSYN)  IV 3.375 g (05/12/20 0505)     LOS: 0 days   Time spent: 40 minutes.  Lorella Nimrod, MD [facility]  If 7PM-7AM, please contact night-coverage [email or web address]  05/12/2020, 2:21 PM   This record has been created using Systems analyst. Errors have been sought and corrected,but may not always be located. Such creation errors do not reflect on the standard of care.

## 2020-05-13 LAB — BASIC METABOLIC PANEL
Anion gap: 7 (ref 5–15)
BUN: 11 mg/dL (ref 6–20)
CO2: 26 mmol/L (ref 22–32)
Calcium: 8.6 mg/dL — ABNORMAL LOW (ref 8.9–10.3)
Chloride: 106 mmol/L (ref 98–111)
Creatinine, Ser: 0.83 mg/dL (ref 0.44–1.00)
GFR calc Af Amer: >60 mL/min (ref >60)
GFR calc non Af Amer: >60 mL/min (ref >60)
Glucose, Bld: 167 mg/dL — ABNORMAL HIGH (ref 70–99)
Potassium: 3.8 mmol/L (ref 3.5–5.1)
Sodium: 139 mmol/L (ref 135–145)

## 2020-05-13 LAB — CBC
HCT: 37.4 % (ref 36.0–46.0)
Hemoglobin: 12.1 g/dL (ref 12.0–15.0)
MCH: 29 pg (ref 26.0–34.0)
MCHC: 32.4 g/dL (ref 30.0–36.0)
MCV: 89.7 fL (ref 80.0–100.0)
Platelets: 213 10*3/uL (ref 150–400)
RBC: 4.17 MIL/uL (ref 3.87–5.11)
RDW: 14.1 % (ref 11.5–15.5)
WBC: 7.9 10*3/uL (ref 4.0–10.5)
nRBC: 0 % (ref 0.0–0.2)

## 2020-05-13 LAB — HEMOGLOBIN A1C
Hgb A1c MFr Bld: 9.6 % — ABNORMAL HIGH (ref 4.8–5.6)
Mean Plasma Glucose: 228.82 mg/dL

## 2020-05-13 LAB — GLUCOSE, CAPILLARY
Glucose-Capillary: 147 mg/dL — ABNORMAL HIGH (ref 70–99)
Glucose-Capillary: 198 mg/dL — ABNORMAL HIGH (ref 70–99)

## 2020-05-13 MED ORDER — INSULIN ASPART 100 UNIT/ML ~~LOC~~ SOLN
0.0000 [IU] | Freq: Three times a day (TID) | SUBCUTANEOUS | Status: DC
  Administered 2020-05-13: 3 [IU] via SUBCUTANEOUS
  Administered 2020-05-13: 2 [IU] via SUBCUTANEOUS

## 2020-05-13 MED ORDER — PROCHLORPERAZINE EDISYLATE 10 MG/2ML IJ SOLN
10.0000 mg | Freq: Once | INTRAMUSCULAR | Status: AC
  Administered 2020-05-13: 10 mg via INTRAVENOUS
  Filled 2020-05-13: qty 2

## 2020-05-13 MED ORDER — CIPROFLOXACIN HCL 500 MG PO TABS
500.0000 mg | ORAL_TABLET | Freq: Two times a day (BID) | ORAL | 0 refills | Status: AC
Start: 2020-05-13 — End: 2020-05-18

## 2020-05-13 MED ORDER — POLYETHYLENE GLYCOL 3350 17 G PO PACK: 17.0000 g | PACK | Freq: Two times a day (BID) | ORAL | 1 refills | Status: DC

## 2020-05-13 MED ORDER — KETOROLAC TROMETHAMINE 30 MG/ML IJ SOLN
30.0000 mg | Freq: Once | INTRAMUSCULAR | Status: AC
  Administered 2020-05-13: 30 mg via INTRAVENOUS
  Filled 2020-05-13: qty 1

## 2020-05-13 MED ORDER — ACETAMINOPHEN 325 MG PO TABS: 650.0000 mg | ORAL_TABLET | Freq: Four times a day (QID) | ORAL | 0 refills | Status: DC | PRN

## 2020-05-13 MED ORDER — METRONIDAZOLE 500 MG PO TABS
500.0000 mg | ORAL_TABLET | Freq: Three times a day (TID) | ORAL | 0 refills | Status: AC
Start: 2020-05-13 — End: 2020-05-18

## 2020-05-13 NOTE — Progress Notes (Signed)
[name] and O x4. VSS. Pt tolerating diet well. No complaints of nausea or vomiting. IV removed intact, prescriptions given. Pt voices understanding of discharge instructions with no further questions. Patient discharged via wheelchair with [facility].  Allergies as of 05/13/2020   No Known Allergies     Medication List    TAKE these medications   acetaminophen 325 MG tablet Commonly known as: TYLENOL Take 2 tablets (650 mg total) by mouth every 6 (six) hours as needed for mild pain or fever.   albuterol 108 (90 Base) MCG/ACT inhaler Commonly known as: VENTOLIN HFA Inhale 1-2 puffs into the lungs every 6 (six) hours as needed for wheezing or shortness of breath.   albuterol (2.5 MG/3ML) 0.083% nebulizer solution Commonly known as: PROVENTIL Take 3 mLs (2.5 mg total) by nebulization every 2 (two) hours as needed for wheezing or shortness of breath.   aspirin EC 81 MG tablet Take 81 mg by mouth daily.   atorvastatin 10 MG tablet Commonly known as: LIPITOR Take 10 mg by mouth daily.   ciprofloxacin 500 MG tablet Commonly known as: Cipro Take 1 tablet (500 mg total) by mouth 2 (two) times daily for 5 days.   cyclobenzaprine 10 MG tablet Commonly known as: FLEXERIL Take 1 tablet (10 mg total) by mouth 2 (two) times daily as needed for muscle spasms.   diclofenac sodium 1 % Gel Commonly known as: VOLTAREN Apply 2 g topically 4 (four) times daily as needed (pain). What changed: when to take this   gabapentin 300 MG capsule Commonly known as: NEURONTIN Take 1 capsule (300 mg total) by mouth 2 (two) times daily.   glipiZIDE 5 MG tablet Commonly known as: Glucotrol Take 1 tablet (5 mg total) by mouth every morning.   hydrochlorothiazide 25 MG tablet Commonly known as: HYDRODIURIL Take 25 mg by mouth daily.   loratadine 10 MG tablet Commonly known as: CLARITIN Take 1 tablet (10 mg total) by mouth daily.   losartan 50 MG tablet Commonly known as: COZAAR Take 1  tablet (50 mg total) by mouth daily.   metFORMIN 1000 MG tablet Commonly known as: Glucophage Take 1 tablet (1,000 mg total) by mouth 2 (two) times daily with [name] meal. See discharge instructions for how to take medicine at first   metroNIDAZOLE 500 MG tablet Commonly known as: Flagyl Take 1 tablet (500 mg total) by mouth 3 (three) times daily for 5 days.   omeprazole 20 MG capsule Commonly known as: PRILOSEC Take 2 capsules (40 mg total) by mouth daily. What changed: how much to take   polyethylene glycol 17 g packet Commonly known as: MIRALAX / GLYCOLAX Take 17 g by mouth 2 (two) times daily.   sertraline 50 MG tablet Commonly known as: ZOLOFT Take 1.5 tablets (75 mg total) by mouth every morning.   Ubrogepant 100 MG Tabs Take 100 mg by mouth daily as needed.   Vitamin D3 1.25 MG (50000 UT) Caps Take 1 capsule by mouth once [name] week.       Vitals:   05/13/20 0830 05/13/20 1212  BP: (!) 155/104 (!) 142/100  Pulse: 86 81  Resp:  16  Temp:  98.3 F (36.8 C)  SpO2:  100%    [name]

## 2020-05-13 NOTE — Discharge Summary (Signed)
Physician Discharge Summary  [name] [id number] DOB: 15-May-1966 DOA: 05/11/2020  PCP: Crystal Carls, MD  Admit date: 05/11/2020 Discharge date: 05/13/2020  Admitted From: Home Disposition: Home  Recommendations for Outpatient Follow-up:  1. Follow up with PCP in 1-2 weeks 2. Please obtain BMP/CBC in one week 3. Please follow up on the following pending results: None  Home Health: No Equipment/Devices: None Discharge Condition: Stable CODE STATUS: Full Diet recommendation: Heart Healthy / Carb Modified   Brief/Interim Summary: Crystal L Johnsonis a 54 y.o.femalewith medical history significant ofhypertension, hyperlipidemia, diabetes mellitus, asthma, GERD, depression, who presents with abdominal pain. CT abdomen concerning for acute diverticulitis at the descending/sigmoid junction.  She was started on Zosyn.  Patient has an history of longstanding constipation.  Her last bowel movement was 4 days before admission.  She was given multiple laxatives including Dulcolax suppository and enema and had a small bowel movement today. Her pain improved.  No nausea or vomiting.  She was able to tolerate diet. She was discharged home on 5 more days of Cipro and Flagyl. She was also advised to keep herself well-hydrated and take MiraLAX twice daily till she become regular, then she can decrease it to once daily.  She will continue rest of her home meds and follow-up with her primary care physician.  Discharge Diagnoses:  Principal Problem:   Acute diverticulitis Active Problems:   Asthma   Essential hypertension   Diabetes mellitus without complication (HCC)   Depression   HLD (hyperlipidemia)   GERD (gastroesophageal reflux disease)   Diverticulitis large intestine  Discharge Instructions  Discharge Instructions    Diet - low sodium heart healthy   Complete by: As directed    Discharge instructions   Complete by: As directed    It was pleasure taking care of you. It  is important that you take MiraLAX twice daily initially till you become regular, then start taking daily.  You can stop taking it if you experience worsening diarrhea.  Avoid constipation.  Keep yourself well-hydrated. Continue taking your antibiotics for another 5 days. You can use Tylenol as needed for pain. Follow-up with your primary care physician for further recommendations.   Increase activity slowly   Complete by: As directed      Allergies as of 05/13/2020   No Known Allergies     Medication List    TAKE these medications   acetaminophen 325 MG tablet Commonly known as: TYLENOL Take 2 tablets (650 mg total) by mouth every 6 (six) hours as needed for mild pain or fever.   albuterol 108 (90 Base) MCG/ACT inhaler Commonly known as: VENTOLIN HFA Inhale 1-2 puffs into the lungs every 6 (six) hours as needed for wheezing or shortness of breath.   albuterol (2.5 MG/3ML) 0.083% nebulizer solution Commonly known as: PROVENTIL Take 3 mLs (2.5 mg total) by nebulization every 2 (two) hours as needed for wheezing or shortness of breath.   aspirin EC 81 MG tablet Take 81 mg by mouth daily.   atorvastatin 10 MG tablet Commonly known as: LIPITOR Take 10 mg by mouth daily.   ciprofloxacin 500 MG tablet Commonly known as: Cipro Take 1 tablet (500 mg total) by mouth 2 (two) times daily for 5 days.   cyclobenzaprine 10 MG tablet Commonly known as: FLEXERIL Take 1 tablet (10 mg total) by mouth 2 (two) times daily as needed for muscle spasms.   diclofenac sodium 1 % Gel Commonly known as: VOLTAREN Apply 2 g topically 4 (four)  times daily as needed (pain). What changed: when to take this   gabapentin 300 MG capsule Commonly known as: NEURONTIN Take 1 capsule (300 mg total) by mouth 2 (two) times daily.   glipiZIDE 5 MG tablet Commonly known as: Glucotrol Take 1 tablet (5 mg total) by mouth every morning.   hydrochlorothiazide 25 MG tablet Commonly known as: HYDRODIURIL Take  25 mg by mouth daily.   loratadine 10 MG tablet Commonly known as: CLARITIN Take 1 tablet (10 mg total) by mouth daily.   losartan 50 MG tablet Commonly known as: COZAAR Take 1 tablet (50 mg total) by mouth daily.   metFORMIN 1000 MG tablet Commonly known as: Glucophage Take 1 tablet (1,000 mg total) by mouth 2 (two) times daily with a meal. See discharge instructions for how to take medicine at first   metroNIDAZOLE 500 MG tablet Commonly known as: Flagyl Take 1 tablet (500 mg total) by mouth 3 (three) times daily for 5 days.   omeprazole 20 MG capsule Commonly known as: PRILOSEC Take 2 capsules (40 mg total) by mouth daily. What changed: how much to take   polyethylene glycol 17 g packet Commonly known as: MIRALAX / GLYCOLAX Take 17 g by mouth 2 (two) times daily.   sertraline 50 MG tablet Commonly known as: ZOLOFT Take 1.5 tablets (75 mg total) by mouth every morning.   Ubrogepant 100 MG Tabs Take 100 mg by mouth daily as needed.   Vitamin D3 1.25 MG (50000 UT) Caps Take 1 capsule by mouth once a week.      Follow-up Information    Crystal Carls, MD. Schedule an appointment as soon as possible for a visit.   Specialty: Internal Medicine Contact information: [place] [address] [phone]          No Known Allergies  Consultations:  None  Procedures/Studies: MR BRAIN W WO CONTRAST  Result Date: 04/21/2020 CLINICAL DATA:  Headaches. Recent fall. Blurry vision and dizziness. EXAM: MRI HEAD WITHOUT AND WITH CONTRAST TECHNIQUE: Multiplanar, multiecho pulse sequences of the brain and surrounding structures were obtained without and with intravenous contrast. CONTRAST:  7.11mL GADAVIST GADOBUTROL 1 MMOL/ML IV SOLN COMPARISON:  None. FINDINGS: BRAIN: No acute infarct, acute hemorrhage or extra-axial collection. Minimal white matter hyperintensity, nonspecific and commonly seen in asymptomatic patients of this age. Normal volume of brain  parenchyma and CSF spaces. Midline structures are normal. There is no abnormal contrast enhancement. VASCULAR: Major flow voids are preserved. Susceptibility-sensitive sequences show no chronic microhemorrhage or superficial siderosis. SKULL AND UPPER CERVICAL SPINE: Normal calvarium and skull base. Visualized upper cervical spine and soft tissues are normal. SINUSES/ORBITS: No paranasal sinus fluid levels or advanced mucosal thickening. No mastoid or middle ear effusion. Normal orbits. IMPRESSION: Normal brain MRI. Electronically Signed   By: Ulyses Jarred M.D.   On: 04/21/2020 23:50   MR CERVICAL SPINE WO CONTRAST  Result Date: 04/22/2020 CLINICAL DATA:  Recent fall. Neck pain. EXAM: MRI CERVICAL SPINE WITHOUT CONTRAST TECHNIQUE: Multiplanar, multisequence MR imaging of the cervical spine was performed. No intravenous contrast was administered. COMPARISON:  None. FINDINGS: Alignment: Physiologic. Vertebrae: No fracture, evidence of discitis, or bone lesion. Cord: Normal signal and morphology. Posterior Fossa, vertebral arteries, paraspinal tissues: Negative Disc levels: C1-2: Unremarkable. C2-3: Normal disc space and facet joints. There is no spinal canal stenosis. No neural foraminal stenosis. C3-4: Normal disc space and facet joints. There is no spinal canal stenosis. No neural foraminal stenosis. C4-5: Disc desiccation without herniation.  There is no spinal canal stenosis. No neural foraminal stenosis. C5-6: Disc desiccation without herniation. There is no spinal canal stenosis. No neural foraminal stenosis. C6-7: Small central disc extrusion with superior migration to the infrapedicle level. There is no spinal canal stenosis. No neural foraminal stenosis. C7-T1: Normal disc space and facet joints. There is no spinal canal stenosis. No neural foraminal stenosis. IMPRESSION: 1. No acute abnormality of the cervical spine. 2. No spinal canal or neural foraminal stenosis. 3. Small central disc extrusion with  superior migration at C6-7. No stenosis. Electronically Signed   By: Ulyses Jarred M.D.   On: 04/22/2020 00:01   CT ABDOMEN PELVIS W CONTRAST  Result Date: 05/11/2020 CLINICAL DATA:  Abdominal distension. Left lower quadrant pain and elevated white count. EXAM: CT ABDOMEN AND PELVIS WITH CONTRAST TECHNIQUE: Multidetector CT imaging of the abdomen and pelvis was performed using the standard protocol following bolus administration of intravenous contrast. CONTRAST:  130mL OMNIPAQUE IOHEXOL 300 MG/ML  SOLN COMPARISON:  01/19/2020 FINDINGS: Lower chest: Negative . Hepatobiliary: scattered benign appearing low densities in the liver, 3-5 mm in size, unchanged. No calcified gallstones Pancreas: Normal Spleen: Normal. Adrenals/Urinary Tract: No change in a left adrenal mass with calcifications measuring up to 2.9 cm in size. This is stable since 2016. nonobstructing 3 mm stone in the upper pole of the left kidney. 1 mm nonobstructing stone in the lower pole the left kidney. 1.9 cm cyst in the midportion of the left kidney. Bladder is normal. Stomach/Bowel: Stomach and small intestine are normal. Patient has diverticulosis of the colon, with acute diverticulitis at the descending/ sigmoid junction. Phlegmonous inflammation without evidence of abscess. No free fluid or air. Vascular/Lymphatic: Aorta is normal. IVC is normal. No retroperitoneal adenopathy. Reproductive: Previous hysterectomy.  No pelvic mass. Other: No free fluid or air. Musculoskeletal: Normal IMPRESSION: Acute diverticulitis at the descending/ sigmoid junction. Phlegmonous inflammation without abscess formation. No free fluid or air. 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 Electronically Signed   By: Nelson Chimes M.D.   On: 05/11/2020 13:57   MR SHOULDER LEFT WO CONTRAST  Result Date: 05/07/2020 CLINICAL DATA:  Left shoulder pain since the patient suffered an injury due to a fall in the bathtub  in April, 2021. EXAM: MRI OF THE LEFT SHOULDER WITHOUT CONTRAST TECHNIQUE: Multiplanar, multisequence MR imaging of the shoulder was performed. No intravenous contrast was administered. COMPARISON:  None. FINDINGS: Rotator cuff:  Intact.  Rotator cuff tendinopathy is noted. Muscles:  Normal.  No atrophy or focal lesion. Biceps long head: The tendon is completely torn in the bicipital interval. A stump of tendon measuring approximately 1.2 cm in length is seen attaching to the superior labrum. 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 There is subchondral cyst formation and edema about the synchondrosis consistent with degenerative change. Glenohumeral Joint: Negative. Labrum:  Intact. Bones:  No fracture, contusion or focal lesion. Other: None. IMPRESSION: Complete tear of the long head of biceps in the bicipital interval. Approximately 1 cm stump of tendon attached to the superior labrum. 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 Electronically Signed   By: Inge Rise M.D.   On: 05/07/2020 11:37    Subjective:   Discharge Exam: Vitals:   05/13/20 0830 05/13/20 1212  BP: (!) 155/104 (!) 142/100  Pulse: 86 81  Resp:  16  Temp:  98.3 F (36.8  C)  SpO2:  100%   Vitals:   05/12/20 2102 05/13/20 0519 05/13/20 0830 05/13/20 1212  BP: 118/86 (!) 147/95 (!) 155/104 (!) 142/100  Pulse: 88 85 86 81  Resp: 18 20  16   Temp: 98.1 F (36.7 C) 97.7 F (36.5 C)  98.3 F (36.8 C)  TempSrc: Oral Oral  Oral  SpO2: 99% 99%  100%  Weight:      Height:        General: Pt is alert, awake, not in acute distress Cardiovascular: RRR, S1/S2 +, no rubs, no gallops Respiratory: CTA bilaterally, no wheezing, no rhonchi Abdominal: Soft, mild LLQ tenderness, ND, bowel sounds + Extremities: no edema, no cyanosis   The results of significant diagnostics from this  hospitalization (including imaging, microbiology, ancillary and laboratory) are listed below for reference.    Microbiology: Recent Results (from the past 240 hour(s))  SARS Coronavirus 2 by RT PCR (hospital order, performed in [facility] hospital lab) Nasopharyngeal Nasopharyngeal Swab     Status: None   Collection Time: 05/11/20  2:52 PM   Specimen: Nasopharyngeal Swab  Result Value Ref Range Status   SARS Coronavirus 2 NEGATIVE NEGATIVE Final    Comment: (NOTE) SARS-CoV-2 target nucleic acids are NOT DETECTED. The SARS-CoV-2 RNA is generally detectable in upper and lower respiratory specimens during the acute phase of infection. The lowest concentration of SARS-CoV-2 viral copies this assay can detect is 250 copies / mL. A negative result does not preclude SARS-CoV-2 infection and should not be used as the sole basis for treatment or other patient management decisions.  A negative result may occur with improper specimen collection / handling, submission of specimen other than nasopharyngeal swab, presence of viral mutation(s) within the areas targeted by this assay, and inadequate number of viral copies (<250 copies / mL). A negative result must be combined with clinical observations, patient history, and epidemiological information. Fact Sheet for Patients:   StrictlyIdeas.no Fact Sheet for Healthcare Providers: BankingDealers.co.za This test is not yet approved or cleared  by the [place] FDA and has been authorized for detection and/or diagnosis of SARS-CoV-2 by FDA under an Emergency Use Authorization (EUA).  This EUA will remain in effect (meaning this test can be used) for the duration of the COVID-19 declaration under Section 564(b)(1) of the Act, 21 U.S.C. section 360bbb-3(b)(1), unless the authorization is terminated or revoked sooner. Performed at [facility], [address] [place], [place] [address]    CULTURE, BLOOD (ROUTINE X 2) w Reflex to ID Panel     Status: None (Preliminary result)   Collection Time: 05/11/20  2:52 PM   Specimen: BLOOD  Result Value Ref Range Status   Specimen Description BLOOD Blood Culture adequate volume  Final   Special Requests   Final    BOTTLES DRAWN AEROBIC AND ANAEROBIC RIGHT ANTECUBITAL   Culture   Final    NO GROWTH 2 DAYS Performed at [facility], [address], [place], [place] [address]    Report Status PENDING  Incomplete  CULTURE, BLOOD (ROUTINE X 2) w Reflex to ID Panel     Status: None (Preliminary result)   Collection Time: 05/11/20  2:53 PM   Specimen: BLOOD  Result Value Ref Range Status   Specimen Description BLOOD  Final   Special Requests NONE  Final   Culture   Final    NO GROWTH 2 DAYS Performed at [facility], [address], [place], [place] [address]  Report Status PENDING  Incomplete     Labs: BNP (last 3 results) No results for input(s): BNP in the last 8760 hours. Basic Metabolic Panel: Recent Labs  Lab 05/11/20 1123 05/12/20 0432 05/13/20 0423  NA 135 134* 139  K 3.5 4.4 3.8  CL 99 104 106  CO2 25 21* 26  GLUCOSE 333* 167* 167*  BUN 11 11 11   CREATININE 0.90 0.77 0.83  CALCIUM 9.5 8.4* 8.6*   Liver Function Tests: Recent Labs  Lab 05/11/20 1123  AST 18  ALT 25  ALKPHOS 124  BILITOT 1.6*  PROT 8.1  ALBUMIN 4.0   Recent Labs  Lab 05/11/20 1123  LIPASE 19   No results for input(s): AMMONIA in the last 168 hours. CBC: Recent Labs  Lab 05/11/20 1123 05/12/20 0432 05/13/20 0423  WBC 18.7* 13.0* 7.9  HGB 14.6 12.4 12.1  HCT 44.5 38.4 37.4  MCV 89.4 90.4 89.7  PLT 263 215 213   Cardiac Enzymes: No results for input(s): CKTOTAL, CKMB, CKMBINDEX, TROPONINI in the last 168 hours. BNP: Invalid input(s): POCBNP CBG: Recent Labs  Lab 05/12/20 1214 05/12/20 1546 05/12/20 2101 05/13/20 0816 05/13/20 1209  GLUCAP 217* 184* 317* 147* 198*   D-Dimer No results for  input(s): DDIMER in the last 72 hours. Hgb A1c Recent Labs    05/13/20 0529  HGBA1C 9.6*   Lipid Profile No results for input(s): CHOL, HDL, LDLCALC, TRIG, CHOLHDL, LDLDIRECT in the last 72 hours. Thyroid function studies No results for input(s): TSH, T4TOTAL, T3FREE, THYROIDAB in the last 72 hours.  Invalid input(s): FREET3 Anemia work up No results for input(s): VITAMINB12, FOLATE, FERRITIN, TIBC, IRON, RETICCTPCT in the last 72 hours. Urinalysis    Component Value Date/Time   COLORURINE YELLOW (A) 05/11/2020 1123   APPEARANCEUR HAZY (A) 05/11/2020 1123   APPEARANCEUR Cloudy (A) 02/22/2020 1334   LABSPEC 1.031 (H) 05/11/2020 1123   PHURINE 5.0 05/11/2020 1123   GLUCOSEU >=500 (A) 05/11/2020 1123   HGBUR NEGATIVE 05/11/2020 [place] 05/11/2020 1123   BILIRUBINUR Negative 02/22/2020 1334   KETONESUR 20 (A) 05/11/2020 1123   PROTEINUR 30 (A) 05/11/2020 1123   UROBILINOGEN 0.2 04/09/2015 1657   NITRITE NEGATIVE 05/11/2020 1123   LEUKOCYTESUR NEGATIVE 05/11/2020 1123   Sepsis Labs Invalid input(s): PROCALCITONIN,  WBC,  LACTICIDVEN Microbiology Recent Results (from the past 240 hour(s))  SARS Coronavirus 2 by RT PCR (hospital order, performed in [place] hospital lab) Nasopharyngeal Nasopharyngeal Swab     Status: None   Collection Time: 05/11/20  2:52 PM   Specimen: Nasopharyngeal Swab  Result Value Ref Range Status   SARS Coronavirus 2 NEGATIVE NEGATIVE Final    Comment: (NOTE) SARS-CoV-2 target nucleic acids are NOT DETECTED. The SARS-CoV-2 RNA is generally detectable in upper and lower respiratory specimens during the acute phase of infection. The lowest concentration of SARS-CoV-2 viral copies this assay can detect is 250 copies / mL. A negative result does not preclude SARS-CoV-2 infection and should not be used as the sole basis for treatment or other patient management decisions.  A negative result may occur with improper specimen  collection / handling, submission of specimen other than nasopharyngeal swab, presence of viral mutation(s) within the areas targeted by this assay, and inadequate number of viral copies (<250 copies / mL). A negative result must be combined with clinical observations, patient history, and epidemiological information. Fact Sheet for Patients:   StrictlyIdeas.no Fact Sheet for Healthcare Providers: BankingDealers.co.za This test is  not yet approved or cleared  by the [place] and has been authorized for detection and/or diagnosis of SARS-CoV-2 by FDA under an Emergency Use Authorization (EUA).  This EUA will remain in effect (meaning this test can be used) for the duration of the COVID-19 declaration under Section 564(b)(1) of the Act, 21 U.S.C. section 360bbb-3(b)(1), unless the authorization is terminated or revoked sooner. Performed at [facility], [address] [place], [place] [address]   CULTURE, BLOOD (ROUTINE X 2) w Reflex to ID Panel     Status: None (Preliminary result)   Collection Time: 05/11/20  2:52 PM   Specimen: BLOOD  Result Value Ref Range Status   Specimen Description BLOOD Blood Culture adequate volume  Final   Special Requests   Final    BOTTLES DRAWN AEROBIC AND ANAEROBIC RIGHT ANTECUBITAL   Culture   Final    NO GROWTH 2 DAYS Performed at [facility], [address], [place], [place] [address]    Report Status PENDING  Incomplete  CULTURE, BLOOD (ROUTINE X 2) w Reflex to ID Panel     Status: None (Preliminary result)   Collection Time: 05/11/20  2:53 PM   Specimen: BLOOD  Result Value Ref Range Status   Specimen Description BLOOD  Final   Special Requests NONE  Final   Culture   Final    NO GROWTH 2 DAYS Performed at [facility], [address] [place], [place] [address]    Report Status PENDING  Incomplete    Time coordinating discharge: Over 30  minutes  SIGNED:  Lorella Nimrod, MD  [facility] 05/13/2020, 1:28 PM  If 7PM-7AM, please contact night-coverage [email or web address]  This record has been created using Systems analyst. Errors have been sought and corrected,but may not always be located. Such creation errors do not reflect on the standard of care.

## 2020-05-16 LAB — CULTURE, BLOOD (ROUTINE X 2)
Culture: NO GROWTH
Culture: NO GROWTH
Specimen Description: ADEQUATE

## 2020-10-23 ENCOUNTER — Encounter: Payer: Self-pay | Admitting: Physical Therapy

## 2020-10-23 ENCOUNTER — Other Ambulatory Visit: Payer: Self-pay

## 2020-10-23 ENCOUNTER — Ambulatory Visit: Payer: Self-pay | Attending: Neurology | Admitting: Physical Therapy

## 2020-10-23 DIAGNOSIS — M25512 Pain in left shoulder: Secondary | ICD-10-CM | POA: Insufficient documentation

## 2020-10-23 NOTE — Therapy (Signed)
[facility] [address] [place], [address] Phone: [phone]   Fax:  [phone]  Physical Therapy Evaluation/Screen (started as eval but during session got word insurance does not cover and patient elected to discontinue)  Patient Details  Name: [name] MRN: [id number] Date of Birth: 1966-10-01 Referring Provider (PT): Hemang K. Manuella Ghazi, MD (neurology)   Encounter Date: 10/23/2020   PT End of Session - 10/23/20 0929    Visit Number 1    Number of Visits 1    Date for PT Re-Evaluation --    Authorization Type [employer] reporting period from 10/23/2020    PT Start Time 0903    PT Stop Time 0947    PT Time Calculation (min) 44 min    Activity Tolerance Patient limited by pain    Behavior During Therapy [facility] for tasks assessed/performed           Past Medical History:  Diagnosis Date  . Asthma   . Depression   . Diabetes mellitus without complication ([place])   . Hypertension     Past Surgical History:  Procedure Laterality Date  . ABDOMINAL HYSTERECTOMY    . TUBAL LIGATION      There were no vitals filed for this visit.    Subjective Assessment - 10/23/20 0930    Subjective Patient reports her whole left side hurts. She has pain in the base of her neck that goes down her right arm and down her right side. Pain goes down all the way to the toes and her hand feels cramped up like a claw. This started 3 months ago when she fell in the shower. She had soap in on her feet and when she put the right foot down she slipped. She has not had any of these problems prior to the fall and it has gotten worse since then. She takes gabapentin, flexeril in the morning and in the evening to help her sleep. She tries not to use the left side because even if she has a bag with mayonnaise and mustard in it it hurts. The left arm is the worst of all the pain. Patient reports Dr. Manuella Ghazi thought the cause was  biceps tendon tear and frozen shoulder in the left shoulder. He did a nerve study, but she has not discussed it yet. 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 States she has had several falls due to her left knee giving out. Uses a SPC for ambulation sometimes (uses things in the house to hold onto but uses SPC to go out of the house). She uses a power cart at the grocery store. She uses the Rolator when she goes longer distances. 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 She didn't need any assistive device. Left group home February 2019. 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 States her doctors suggested a TKA on the left in May but she does not want to do that. They also wanted to do an injection in her back (she is thinking about it). Has back pain that has been hurting for about 2 years and started with a fall on a slick floor. Back pain bothers her more on the left (pain and numbness and tingling to L toes, intermittent). Occasionally has similar symptoms down the right side to the toes. During objective exam, patient found out that her insurance would require her to pay out  of pocket and then submit to the insurance company herself for some amount of reimbursement. Patient requested discontinuation of session at that point due to financial limitations.    Pertinent History Patient is a 54 y.o. female who presents to outpatient physical therapy with a referral for medical diagnosis adhesive capsulitis of left shoulder. This patient's chief complaints consist of left shoulder pain, stiffness, and weakness with left UE paresthesia. Patient also complains of neck pain with intermittrnt pain/paresthesia to R UE, low back pain with B radicular symptoms into both feet L > R. She reports these complaints lead to the following functional deficits: difficulty reaching, dressing, grooming, carrying (laundry basket, groceries), ADLs, IADLs, putting on shoes (cannot tie),  preparing meals, driving, decreased social interaction, household and community ambulation, sleeping.Relevant past medical history and comorbidities include depression (feels like she has the support she needs), neck pain, back pain with radicular symptoms bilaterally, left knee pain, right arm pain, diabetes mellitus (A1c 10.7), COPD, asthma, hypertension, former smoker, gastric reflux, diverticulitis, neuropathy, hx of acute renal failure.  Patient denies hx of cancer, stroke, seizures, major cardiac events, unexplained weight loss, changes in bowel or bladder problems (does pee a lot).    Limitations House hold activities;Lifting;Walking;Standing;Other (comment)   difficulty reaching, dressing, grooming, carrying (laundry basket, groceries), ADLs, IADLs, putting on shoes (cannot tie), preparing meals, driving, decreased social interaction, household and community ambulation, sleeping.   Diagnostic tests L shoulder MRI report 05/07/2020: "IMPRESSION:Complete tear of the long head of biceps in the bicipital interval.Approximately 1 cm stump of tendon attached to the superior labrum. Os acromiale with degenerative change about the synchondrosis.Minimal acromioclavicular osteoarthritis noted."MRI report of cervical spine 04/21/2020: "IMPRESSION:1. No acute abnormality of the cervical spine.2. No spinal canal or neural foraminal stenosis.3. Small central disc extrusion with superior migration at C6-7. Nostenosis."MRI report for thoracic and lumbar spine 07/11/2019: "Mild thoracic degenerative changes. Small central disc protrusion atL3-4. No significant neural impingement."    Currently in Pain? Yes    Pain Score 7    W: 100/10 (brings tears to my eyes): B: 3/10   Pain Location --   L anterior shoulder and UT, Also: base of neck, down B arms to fingers L>R (L constant), L knee, low back to toes L >R   Pain Descriptors / Indicators Pins and needles;Dull;Sharp;Tingling    Pain Type Chronic pain    Pain Radiating  Towards bilateral hands and feets, L > R    Pain Onset More than a month ago    Pain Frequency Constant    Aggravating Factors  L shoulder/UE: cold, trying to move something, using L UE    Pain Relieving Factors L shoulder/UR: medications, resting arm, propping up L arm    Effect of Pain on Daily Activities Functional Limitations: difficulty reaching, dressing, grooming, carrying (laundry basket, groceries), ADLs, IADLs, putting on shoes (cannot tie), preparing meals, driving, decreased social interaction, household and community ambulation, sleeping.              [facility] PT Assessment - 10/23/20 0001      Assessment   Medical Diagnosis adhesive capsulitis of left shoulder    Referring Provider (PT) Hemang K. Manuella Ghazi, MD (neurology)    Onset Date/Surgical Date 07/23/20    Hand Dominance Right    Next MD Visit Dr. Manuella Ghazi in 3 months.     Prior Therapy none for this problem prior to current episode of care      Precautions   Precautions Fall  Restrictions   Weight Bearing Restrictions No      Balance Screen   Has the patient fallen in the past 6 months Yes    How many times? 5-6    Has the patient had a decrease in activity level because of a fear of falling?  Yes    Is the patient reluctant to leave their home because of a fear of falling?  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  Marland Kitchen Posture: forward head, rounded shoulders, slumped in sitting. Shifts away from left side with prolonged sitting. Requests to stand up.  . Tremor: none . Muscle bulk: generally WFL  . Bed mobility: supine <> sit independent . Transfers: sit <> stand independent . Gait: ambulates with mildly antalgic gait. Did not bring assistive device in and does not appear significantly unsteady (no reaching for the wall). More detailed gait analysis deferred to later date as needed.   NEUROLOGICAL  Upper Motor Neuron Screen Hoffman's, and Clonus (ankle) negative bilaterally Dermatomes . C3-T1 appears equal and intact to light touch.   MUSCLE PERFORMANCE (MMT) Comments: screened LE strength due to report of falls: L LE slightly weaker (~ 4-/5 L knee flexion 4/5 L knee extension), B ankle PF, DF, Ev 3/5, B great toe extension 4-/5.   EDUCATION/COGNITION: Patient is alert and oriented X 4.  OBJECTIVE EXAM DISCONTINUED prior to completion when pt got more information about insurance coverage and elected to discontinue session due to financial limitations.   Objective measurements completed on examination: See above findings.      PT Education - 10/23/20 [phone]    Education Details education on peripheral neuropathy vs radicular/referred nerve symptoms. education on insurance coverage. packet of info about advance healthcare directive provided    Person(s) Educated Patient    Methods Explanation;Handout    Comprehension Verbalized understanding             Plan - 10/23/20 1012    Clinical Impression Statement Patient is a 54 y.o. female referred to  outpatient physical therapy with a medical diagnosis of adhesive capsulitis of left shoulder who presents with signs and symptoms consistent with left shoulder and UE pain and paresthesia following a fall 3 months ago. Objective exam interrupted and not completed due to new information about insurance that resulted in pt discontinuing session due to financial concerns. Incomplete testing to attempt differentiation from cervical radicular symptoms  vs more specific shoulder pain diagnosis. Patient's symptoms presented in setting of long head of L biceps tendon rupture, multi-body region chronic pain, uncontrolled diabetes mellitus, chronic back pain with radiation down both legs, L knee pain, neck pain, and significant psychosocial factor including financial limitations, depression, inactivity, low health literacy and conflation of symptoms throughout body with each other with inconsistent timeline. Patient does appear to have significant impairments and functional limitations that would benefit from physical therapy. Patient presents with significant pain, range of motion, paresthesia, activity tolerance, muscle performance (power/strength/endurance), imbalance, gait, fall risk impairments that are limiting ability to complete reaching, dressing, grooming, carrying (laundry basket, groceries), ADLs, IADLs, putting on shoes (cannot tie), preparing meals, driving,  social interaction, household and community ambulation, sleeping without difficulty. Patient will benefit from skilled physical therapy intervention to address current body structure impairments and activity limitations to improve function and work towards return to prior level of function or maximal functional improvement. However, patient elected to discontinue care at this date due to financial limitations, so a plan of care and goals are not established at this point.    Personal Factors and Comorbidities Behavior Pattern;Comorbidity 3+     Comorbidities Relevant past medical history and comorbidities include depression (feels like she has the support she needs), neck pain, back pain with radicular symptoms bilaterally, left knee pain, right arm pain, diabetes mellitus (A1c 10.7), COPD, asthma, hypertension, former smoker, gastric reflux, diverticulitis, neuropathy, hx of acute renal failure    Examination-Activity Limitations Bathing;Hygiene/Grooming;Squat;Lift;Stairs;Bend;Stand;Locomotion Level;Reach Overhead;Carry;Sleep;Dressing;Sit;Transfers    Examination-Participation Restrictions Cleaning;Laundry;Shop;Meal Prep;Community Activity;Driving;Yard Work;Interpersonal Relationship;Other   difficulty reaching, dressing, grooming, carrying (laundry basket, groceries), ADLs, IADLs, putting on shoes (cannot tie), preparing meals, driving, decreased social interaction, household and community ambulation, sleeping.   Stability/Clinical Decision Making Evolving/Moderate complexity    Rehab Potential Fair    PT Next Visit Plan Patient declined adiitional care due to financial limitations. No POC or goals established.    Consulted and Agree with Plan of Care Patient           Patient will benefit from skilled therapeutic intervention in order to improve the following deficits and impairments:  Abnormal gait, Pain, Improper body mechanics, Decreased activity tolerance, Decreased endurance, Decreased range of motion, Decreased strength, Impaired UE functional use, Impaired perceived functional ability, Obesity, Difficulty walking, Decreased balance  Visit Diagnosis: Left shoulder pain, unspecified chronicity     Problem List Patient Active Problem List   Diagnosis Date Noted  . Diverticulitis large intestine 05/12/2020  . Depression   . HLD (hyperlipidemia)   . GERD (gastroesophageal reflux disease)   . Acute diverticulitis   . Influenza A 01/26/2019  . Acute renal failure (ARF) ([place]) 01/26/2019  . Marijuana use 06/12/2017  . Acute  pain of right knee 10/14/2016  . Diabetic neuropathy ([place]) 01/07/2016  . Plantar fasciitis of left foot 11/26/2015  . Back pain 10/10/2015  . Asthma 09/29/2015  . Essential hypertension 09/29/2015  . Diabetes mellitus without complication ([place]) 53/97/6734    Everlean Alstrom. Graylon Good, PT, DPT 10/23/20, 10:16 AM  [facility] [address], [place], [address] Phone: [phone]   Fax:  [phone]  Name: [name] MRN: [id number] Date of Birth: December 13, 1966

## 2020-10-30 ENCOUNTER — Encounter: Payer: 59 | Admitting: Physical Therapy

## 2020-11-05 ENCOUNTER — Encounter: Payer: 59 | Admitting: Physical Therapy

## 2020-11-12 ENCOUNTER — Encounter: Payer: 59 | Admitting: Physical Therapy

## 2020-11-14 ENCOUNTER — Encounter: Payer: 59 | Admitting: Physical Therapy

## 2020-11-21 ENCOUNTER — Encounter: Payer: 59 | Admitting: Physical Therapy

## 2021-07-02 ENCOUNTER — Encounter: Payer: Self-pay | Admitting: *Deleted

## 2021-09-10 ENCOUNTER — Other Ambulatory Visit: Payer: Self-pay | Admitting: Internal Medicine

## 2021-09-10 ENCOUNTER — Other Ambulatory Visit (HOSPITAL_COMMUNITY): Payer: Self-pay | Admitting: Internal Medicine

## 2021-09-10 DIAGNOSIS — R1030 Lower abdominal pain, unspecified: Secondary | ICD-10-CM

## 2021-09-11 ENCOUNTER — Ambulatory Visit
Admission: RE | Admit: 2021-09-11 | Discharge: 2021-09-11 | Disposition: A | Discharge status: Home / Self Care | Payer: 59 | Source: Ambulatory Visit | Attending: Internal Medicine | Admitting: Internal Medicine

## 2021-09-11 ENCOUNTER — Other Ambulatory Visit: Payer: Self-pay

## 2021-09-11 DIAGNOSIS — R1030 Lower abdominal pain, unspecified: Secondary | ICD-10-CM | POA: Diagnosis not present

## 2021-09-17 ENCOUNTER — Other Ambulatory Visit: Payer: Self-pay

## 2021-09-17 ENCOUNTER — Encounter: Payer: Self-pay | Admitting: Gastroenterology

## 2021-09-17 ENCOUNTER — Ambulatory Visit (INDEPENDENT_AMBULATORY_CARE_PROVIDER_SITE_OTHER): Payer: 59 | Admitting: Gastroenterology

## 2021-09-17 VITALS — BP 152/115 | HR 86 | Temp 98.7°F | Wt 167.6 lb

## 2021-09-17 DIAGNOSIS — R1030 Lower abdominal pain, unspecified: Secondary | ICD-10-CM

## 2021-09-17 DIAGNOSIS — K5904 Chronic idiopathic constipation: Secondary | ICD-10-CM | POA: Diagnosis not present

## 2021-09-17 DIAGNOSIS — R634 Abnormal weight loss: Secondary | ICD-10-CM

## 2021-09-17 DIAGNOSIS — E1169 Type 2 diabetes mellitus with other specified complication: Secondary | ICD-10-CM

## 2021-09-17 DIAGNOSIS — R140 Abdominal distension (gaseous): Secondary | ICD-10-CM

## 2021-09-17 MED ORDER — PEG 3350-KCL-NA BICARB-NACL 420 G PO SOLR
4000.0000 mL | Freq: Once | ORAL | 0 refills | Status: AC
Start: 2021-09-17 — End: 2021-09-17

## 2021-09-17 NOTE — Progress Notes (Signed)
Cephas Darby, MD [address]  [place]  [place], [place] [address]  Main: [phone]  Fax: [phone]    Gastroenterology Consultation  Referring Provider:     [name], MD Primary Care Physician:  [name], MD Primary Gastroenterologist:  Dr. Cephas Darby Reason for Consultation:     Abdominal bloating, lower abdominal pain        HPI:   [name] is a 55 y.o. female referred by Dr. [name], MD  for consultation & management of several months history of lower abdominal pain, abdominal bloating, irregular bowel habits.  Patient has poorly controlled diabetes with diabetic neuropathy, blurred vision, chronic fatigue, currently on metformin 1000 mg twice daily.  Patient has stopped working more than a year ago due to above symptoms, she used to be a caregiver.  Today, patient has been tearful and felt very low as she cannot get out of the house, she lives alone.  She says her whole body hurts and also she has biceps tear in her left hand, has appointment to see orthopedic.  She is on gabapentin 600 mg 3 times daily for neuropathic pain, and generalized pain.  She reports that she does not eat healthy, her blood sugars usually run around 300s.  Patient does not smoke or drink alcohol  NSAIDs: None  Antiplts/Anticoagulants/Anti thrombotics: None  GI Procedures: None She denies family history of GI malignancy  Past Medical History:  Diagnosis Date   Asthma    Depression    Diabetes mellitus without complication ([place])    Hypertension     Past Surgical History:  Procedure Laterality Date   ABDOMINAL HYSTERECTOMY     TUBAL LIGATION      Current Outpatient Medications:    acetaminophen (TYLENOL) 325 MG tablet, Take 2 tablets (650 mg total) by mouth every 6 (six) hours as needed for mild pain or fever., Disp: 30 tablet, Rfl: 0   albuterol (PROVENTIL HFA;VENTOLIN HFA) 108 (90 Base) MCG/ACT inhaler, Inhale 1-2 puffs into the lungs every 6  (six) hours as needed for wheezing or shortness of breath., Disp: 1 Inhaler, Rfl: 0   albuterol (PROVENTIL) (2.5 MG/3ML) 0.083% nebulizer solution, Take 3 mLs (2.5 mg total) by nebulization every 2 (two) hours as needed for wheezing or shortness of breath., Disp: 75 mL, Rfl: 0   aspirin EC 81 MG tablet, Take 81 mg by mouth daily., Disp: , Rfl:    atorvastatin (LIPITOR) 10 MG tablet, Take 10 mg by mouth daily., Disp: , Rfl:    Cholecalciferol (VITAMIN D3) 1.25 MG (50000 UT) CAPS, Take 1 capsule by mouth once a week., Disp: , Rfl:    cyclobenzaprine (FLEXERIL) 5 MG tablet, TAKE 1 TABLET BY MOUTH NIGHTLY AS NEEDEDFOR MUSCLE SPASMS, Disp: , Rfl:    diclofenac sodium (VOLTAREN) 1 % GEL, Apply 2 g topically 4 (four) times daily as needed (pain). (Patient taking differently: Apply 2 g topically in the morning, at noon, in the evening, and at bedtime.), Disp: 1 Tube, Rfl: 0   DULoxetine (CYMBALTA) 20 MG capsule, Take by mouth., Disp: , Rfl:    gabapentin (NEURONTIN) 600 MG tablet, Take by mouth., Disp: , Rfl:    glipiZIDE (GLUCOTROL) 5 MG tablet, Take 1 tablet (5 mg total) by mouth every morning., Disp: 30 tablet, Rfl: 0   hydrochlorothiazide (HYDRODIURIL) 25 MG tablet, Take 25 mg by mouth daily., Disp: , Rfl:    loratadine (CLARITIN) 10 MG tablet, Take 1 tablet (10  mg total) by mouth daily., Disp: 30 tablet, Rfl: 0   losartan (COZAAR) 50 MG tablet, Take 1 tablet (50 mg total) by mouth daily., Disp: 30 tablet, Rfl: 1   metoprolol tartrate (LOPRESSOR) 50 MG tablet, Take by mouth., Disp: , Rfl:    omeprazole (PRILOSEC) 20 MG capsule, Take 2 capsules (40 mg total) by mouth daily. (Patient taking differently: Take 20 mg by mouth daily.), Disp: 60 capsule, Rfl: 0   polyethylene glycol (MIRALAX / GLYCOLAX) 17 g packet, Take 17 g by mouth 2 (two) times daily., Disp: 30 each, Rfl: 1   sertraline (ZOLOFT) 50 MG tablet, Take 1.5 tablets (75 mg total) by mouth every morning., Disp: 45 tablet, Rfl: 0   Ubrogepant 100  MG TABS, Take 100 mg by mouth daily as needed., Disp: , Rfl:    metFORMIN (GLUCOPHAGE) 1000 MG tablet, Take 1 tablet (1,000 mg total) by mouth 2 (two) times daily with a meal. See discharge instructions for how to take medicine at first, Disp: 60 tablet, Rfl: 1    Family History  Problem Relation Age of Onset   Hypertension Mother      Social History   Tobacco Use   Smoking status: Former    Packs/day: 3.00    Years: 10.00    Pack years: 30.00    Types: Cigarettes    Quit date: 12/22/2004    Years since quitting: 16.7   Smokeless tobacco: Never  Vaping Use   Vaping Use: Never used  Substance Use Topics   Alcohol use: No   Drug use: No    Allergies as of 09/17/2021   (No Known Allergies)    Review of Systems:    All systems reviewed and negative except where noted in HPI.   Physical Exam:  BP (!) 152/115 (BP Location: Left Arm, Patient Position: Sitting, Cuff Size: Normal)   Pulse 86   Temp 98.7 F (37.1 C) (Oral)   Wt 167 lb 9.6 oz (76 kg)   BMI 30.65 kg/m  No LMP recorded. Patient has had a hysterectomy.  General:   Alert,  Well-developed, well-nourished, pleasant and cooperative in NAD Head:  Normocephalic and atraumatic. Eyes:  Sclera clear, no icterus.   Conjunctiva pink, bilateral proptosis. Ears:  Normal auditory acuity. Nose:  No deformity, discharge, or lesions. Mouth:  No deformity or lesions,oropharynx pink & moist. Neck:  Supple; no masses or thyromegaly. Lungs:  Respirations even and unlabored.  Clear throughout to auscultation.   No wheezes, crackles, or rhonchi. No acute distress. Heart:  Regular rate and rhythm; no murmurs, clicks, rubs, or gallops. Abdomen:  Normal bowel sounds. Soft, mild lower abdominal tenderness, moderately distended, tympanic to percussion without masses, hepatosplenomegaly or hernias noted.  No guarding or rebound tenderness.   Rectal: Not performed Msk:  Symmetrical without gross deformities. Good, equal movement & strength  bilaterally. Pulses:  Normal pulses noted. Extremities:  No clubbing or edema.  No cyanosis. Neurologic:  Alert and oriented x3;  grossly normal neurologically. Skin:  Intact without significant lesions or rashes. No jaundice. Psych:  Alert and cooperative. Normal mood and affect.  Imaging Studies: Reviewed  Assessment and Plan:   [name] is a 55 y.o. African-American female with obesity, metabolic syndrome, poorly controlled diabetes hemoglobin A1c 9.6 on metformin, diabetic neuropathy is seen in consultation for chronic lower abdominal pain, abdominal bloating and irregular bowel habits.  Patient is also concerned about unintentional weight loss.  Her symptoms are likely worsened with gabapentin use  and she may have diabetic autonomic neuropathy affecting her GI tract.  Today, I have spent majority of the time in counseling the patient regarding management of diabetes, following a diabetic diet to prevent further complications.  Advised to decrease gabapentin to bedtime only.  Given her information on low-fat, high-fiber, diabetic eating plan.  Discussed with her about regular exercise We will try Linzess 290 MCG, samples provided Recheck CBC, CMP, hemoglobin A1c, thyroid profile Recommend upper endoscopy and colonoscopy for further evaluation She will probably benefit from referral to diabetes specialist  Follow up in 4 weeks   Cephas Darby, MD

## 2021-09-17 NOTE — Addendum Note (Signed)
Addended by: Eliseo Squires on: 09/17/2021 03:49 PM   Modules accepted: Orders, SmartSet

## 2021-09-17 NOTE — Patient Instructions (Addendum)
Low-FODMAP Eating Plan FODMAP stands for fermentable oligosaccharides, disaccharides, monosaccharides, and polyols. These are sugars that are hard for some people to digest. A low-FODMAP eating plan may help some people who have irritable bowel syndrome (IBS) and certain other bowel (intestinal) diseases to manage their symptoms. This meal plan can be complicated to follow. Work with a diet and nutrition specialist (dietitian) to make a low-FODMAP eating plan that is right for you. A dietitian can help make sure that you get enough nutrition from this diet. What are tips for following this plan? Reading food labels Check labels for hidden FODMAPs such as: High-fructose syrup. Honey. Agave. Natural fruit flavors. Onion or garlic powder. Choose low-FODMAP foods that contain 3-4 grams of fiber per serving. Check food labels for serving sizes. Eat only one serving at a time to make sure FODMAP levels stay low. Shopping Shop with a list of foods that are recommended on this diet and make a meal plan. Meal planning Follow a low-FODMAP eating plan for up to 6 weeks, or as told by your health care provider or dietitian. To follow the eating plan: Eliminate high-FODMAP foods from your diet completely. Choose only low-FODMAP foods to eat. You will do this for 2-6 weeks. Gradually reintroduce high-FODMAP foods into your diet one at a time. Most people should wait a few days before introducing the next new high-FODMAP food into their meal plan. Your dietitian can recommend how quickly you may reintroduce foods. Keep a daily record of what and how much you eat and drink. Make note of any symptoms that you have after eating. Review your daily record with a dietitian regularly to identify which foods you can eat and which foods you should avoid. General tips Drink enough fluid each day to keep your urine pale yellow. Avoid processed foods. These often have added sugar and may be high in FODMAPs. Avoid most  dairy products, whole grains, and sweeteners. Work with a dietitian to make sure you get enough fiber in your diet. Avoid high FODMAP foods at meals to manage symptoms. Recommended foods Fruits Bananas, oranges, tangerines, lemons, limes, blueberries, raspberries, strawberries, grapes, cantaloupe, honeydew melon, kiwi, papaya, passion fruit, and pineapple. Limited amounts of dried cranberries, banana chips, and shredded coconut. Vegetables Eggplant, zucchini, cucumber, peppers, green beans, bean sprouts, lettuce, arugula, kale, Swiss chard, spinach, collard greens, bok choy, summer squash, potato, and tomato. Limited amounts of corn, carrot, and sweet potato. Green parts of scallions. Grains Gluten-free grains, such as rice, oats, buckwheat, quinoa, corn, polenta, and millet. Gluten-free pasta, bread, or cereal. Rice noodles. Corn tortillas. Meats and other proteins Unseasoned beef, pork, poultry, or fish. Eggs. Berniece Salines. Tofu (firm) and tempeh. Limited amounts of nuts and seeds, such as almonds, walnuts, [place] nuts, pecans, peanuts, nut butters, pumpkin seeds, chia seeds, and sunflower seeds. Dairy Lactose-free milk, yogurt, and kefir. Lactose-free cottage cheese and ice cream. Non-dairy milks, such as almond, coconut, hemp, and rice milk. Non-dairy yogurt. Limited amounts of goat cheese, brie, mozzarella, parmesan, swiss, and other hard cheeses. Fats and oils Butter-free spreads. Vegetable oils, such as olive, canola, and sunflower oil. Seasoning and other foods Artificial sweeteners with names that do not end in "ol," such as aspartame, saccharine, and stevia. Maple syrup, white table sugar, raw sugar, brown sugar, and molasses. Mayonnaise, soy sauce, and tamari. Fresh basil, coriander, parsley, rosemary, and thyme. Beverages Water and mineral water. Sugar-sweetened soft drinks. Small amounts of orange juice or cranberry juice. Black and green tea. Most dry wines. Coffee.  The items listed above  may not be a complete list of foods and beverages you can eat. Contact a dietitian for more information. Foods to avoid Fruits Fresh, dried, and juiced forms of apple, pear, watermelon, peach, plum, cherries, apricots, blackberries, boysenberries, figs, nectarines, and mango. Avocado. Vegetables Chicory root, artichoke, asparagus, cabbage, snow peas, Brussels sprouts, broccoli, sugar snap peas, mushrooms, celery, and cauliflower. Onions, garlic, leeks, and the white part of scallions. Grains Wheat, including kamut, durum, and semolina. Barley and bulgur. Couscous. Wheat-based cereals. Wheat noodles, bread, crackers, and pastries. Meats and other proteins Fried or fatty meat. Sausage. Cashews and pistachios. Soybeans, baked beans, black beans, chickpeas, kidney beans, fava beans, navy beans, lentils, black-eyed peas, and split peas. Dairy Milk, yogurt, ice cream, and soft cheese. Cream and sour cream. Milk-based sauces. Custard. Buttermilk. Soy milk. Seasoning and other foods Any sugar-free gum or candy. Foods that contain artificial sweeteners such as sorbitol, mannitol, isomalt, or xylitol. Foods that contain honey, high-fructose corn syrup, or agave. Bouillon, vegetable stock, beef stock, and chicken stock. Garlic and onion powder. Condiments made with onion, such as hummus, chutney, pickles, relish, salad dressing, and salsa. Tomato paste. Beverages Chicory-based drinks. Coffee substitutes. Chamomile tea. Fennel tea. Sweet or fortified wines such as port or sherry. Diet soft drinks made with isomalt, mannitol, maltitol, sorbitol, or xylitol. Apple, pear, and mango juice. Juices with high-fructose corn syrup. The items listed above may not be a complete list of foods and beverages you should avoid. Contact a dietitian for more information. Summary FODMAP stands for fermentable oligosaccharides, disaccharides, monosaccharides, and polyols. These are sugars that are hard for some people to  digest. A low-FODMAP eating plan is a short-term diet that helps to ease symptoms of certain bowel diseases. The eating plan usually lasts up to 6 weeks. After that, high-FODMAP foods are reintroduced gradually and one at a time. This can help you find out which foods may be causing symptoms. A low-FODMAP eating plan can be complicated. It is best to work with a dietitian who has experience with this type of plan. This information is not intended to replace advice given to you by your health care provider. Make sure you discuss any questions you have with your health care provider. Document Revised: 04/26/2020 Document Reviewed: 04/26/2020 Elsevier Patient Education  [place]. Diabetes Mellitus and Nutrition, Adult When you have diabetes, or diabetes mellitus, it is very important to have healthy eating habits because your blood sugar (glucose) levels are greatly affected by what you eat and drink. Eating healthy foods in the right amounts, at about the same times every day, can help you: Control your blood glucose. Lower your risk of heart disease. Improve your blood pressure. Reach or maintain a healthy weight. What can affect my meal plan? Every person with diabetes is different, and each person has different needs for a meal plan. Your health care provider may recommend that you work with a dietitian to make a meal plan that is best for you. Your meal plan may vary depending on factors such as: The calories you need. The medicines you take. Your weight. Your blood glucose, blood pressure, and cholesterol levels. Your activity level. Other health conditions you have, such as heart or kidney disease. How do carbohydrates affect me? Carbohydrates, also called carbs, affect your blood glucose level more than any other type of food. Eating carbs naturally raises the amount of glucose in your blood. Carb counting is a method for keeping track of how  many carbs you eat. Counting carbs is  important to keep your blood glucose at a healthy level, especially if you use insulin or take certain oral diabetes medicines. It is important to know how many carbs you can safely have in each meal. This is different for every person. Your dietitian can help you calculate how many carbs you should have at each meal and for each snack. How does alcohol affect me? Alcohol can cause a sudden decrease in blood glucose (hypoglycemia), especially if you use insulin or take certain oral diabetes medicines. Hypoglycemia can be a life-threatening condition. Symptoms of hypoglycemia, such as sleepiness, dizziness, and confusion, are similar to symptoms of having too much alcohol. Do not drink alcohol if: Your health care provider tells you not to drink. You are pregnant, may be pregnant, or are planning to become pregnant. If you drink alcohol: Do not drink on an empty stomach. Limit how much you use to: 0-1 drink a day for women. 0-2 drinks a day for men. Be aware of how much alcohol is in your drink. In the [place] one drink equals one 12 oz bottle of beer (355 mL), one 5 oz glass of wine (148 mL), or one 1 oz glass of hard liquor (44 mL). Keep yourself hydrated with water, diet soda, or unsweetened iced tea. Keep in mind that regular soda, juice, and other mixers may contain a lot of sugar and must be counted as carbs. What are tips for following this plan? Reading food labels Start by checking the serving size on the "Nutrition Facts" label of packaged foods and drinks. The amount of calories, carbs, fats, and other nutrients listed on the label is based on one serving of the item. Many items contain more than one serving per package. Check the total grams (g) of carbs in one serving. You can calculate the number of servings of carbs in one serving by dividing the total carbs by 15. For example, if a food has 30 g of total carbs per serving, it would be equal to 2 servings of carbs. Check the number of  grams (g) of saturated fats and trans fats in one serving. Choose foods that have a low amount or none of these fats. Check the number of milligrams (mg) of salt (sodium) in one serving. Most people should limit total sodium intake to less than 2,300 mg per day. Always check the nutrition information of foods labeled as "low-fat" or "nonfat." These foods may be higher in added sugar or refined carbs and should be avoided. Talk to your dietitian to identify your daily goals for nutrients listed on the label. Shopping Avoid buying canned, pre-made, or processed foods. These foods tend to be high in fat, sodium, and added sugar. Shop around the outside edge of the grocery store. This is where you will most often find fresh fruits and vegetables, bulk grains, fresh meats, and fresh dairy. Cooking Use low-heat cooking methods, such as baking, instead of high-heat cooking methods like deep frying. Cook using healthy oils, such as olive, canola, or sunflower oil. Avoid cooking with butter, cream, or high-fat meats. Meal planning Eat meals and snacks regularly, preferably at the same times every day. Avoid going long periods of time without eating. Eat foods that are high in fiber, such as fresh fruits, vegetables, beans, and whole grains. Talk with your dietitian about how many servings of carbs you can eat at each meal. Eat 4-6 oz (112-168 g) of lean protein each day, such  as lean meat, chicken, fish, eggs, or tofu. One ounce (oz) of lean protein is equal to: 1 oz (28 g) of meat, chicken, or fish. 1 egg.  cup (62 g) of tofu. Eat some foods each day that contain healthy fats, such as avocado, nuts, seeds, and fish. What foods should I eat? Fruits Berries. Apples. Oranges. Peaches. Apricots. Plums. Grapes. Mango. Papaya. Pomegranate. Kiwi. Cherries. Vegetables Lettuce. Spinach. Leafy greens, including kale, chard, collard greens, and mustard greens. Beets. Cauliflower. Cabbage. Broccoli. Carrots.  Green beans. Tomatoes. Peppers. Onions. Cucumbers. Brussels sprouts. Grains Whole grains, such as whole-wheat or whole-grain bread, crackers, tortillas, cereal, and pasta. Unsweetened oatmeal. Quinoa. Brown or wild rice. Meats and other proteins Seafood. Poultry without skin. Lean cuts of poultry and beef. Tofu. Nuts. Seeds. Dairy Low-fat or fat-free dairy products such as milk, yogurt, and cheese. The items listed above may not be a complete list of foods and beverages you can eat. Contact a dietitian for more information. What foods should I avoid? Fruits Fruits canned with syrup. Vegetables Canned vegetables. Frozen vegetables with butter or cream sauce. Grains Refined white flour and flour products such as bread, pasta, snack foods, and cereals. Avoid all processed foods. Meats and other proteins Fatty cuts of meat. Poultry with skin. Breaded or fried meats. Processed meat. Avoid saturated fats. Dairy Full-fat yogurt, cheese, or milk. Beverages Sweetened drinks, such as soda or iced tea. The items listed above may not be a complete list of foods and beverages you should avoid. Contact a dietitian for more information. Questions to ask a health care provider Do I need to meet with a diabetes educator? Do I need to meet with a dietitian? What number can I call if I have questions? When are the best times to check my blood glucose? Where to find more information: American Diabetes Association: diabetes.org Academy of Nutrition and Dietetics: www.eatright.[employer] of Diabetes and Digestive and Kidney Diseases: [employer] Association of Diabetes Care and Education Specialists: www.diabeteseducator.org Summary It is important to have healthy eating habits because your blood sugar (glucose) levels are greatly affected by what you eat and drink. A healthy meal plan will help you control your blood glucose and maintain a healthy lifestyle. Your health care provider  may recommend that you work with a dietitian to make a meal plan that is best for you. Keep in mind that carbohydrates (carbs) and alcohol have immediate effects on your blood glucose levels. It is important to count carbs and to use alcohol carefully. This information is not intended to replace advice given to you by your health care provider. Make sure you discuss any questions you have with your health care provider. Document Revised: 11/15/2019 Document Reviewed: 11/15/2019 Elsevier Patient Education  2021 [place]. High-Fiber Eating Plan Fiber, also called dietary fiber, is a type of carbohydrate. It is found foods such as fruits, vegetables, whole grains, and beans. A high-fiber diet can have many health benefits. Your health care provider may recommend a high-fiber diet to help: Prevent constipation. Fiber can make your bowel movements more regular. Lower your cholesterol. Relieve the following conditions: Inflammation of veins in the anus (hemorrhoids). Inflammation of specific areas of the digestive tract (uncomplicated diverticulosis). A problem of the large intestine, also called the colon, that sometimes causes pain and diarrhea (irritable bowel syndrome, or IBS). Prevent overeating as part of a weight-loss plan. Prevent heart disease, type 2 diabetes, and certain cancers. What are tips for following this plan? Reading food labels  Check the nutrition facts label on food products for the amount of dietary fiber. Choose foods that have 5 grams of fiber or more per serving. The goals for recommended daily fiber intake include: Men (age 71 or younger): 34-38 g. Men (over age 43): 28-34 g. Women (age 5 or younger): 25-28 g. Women (over age 22): 22-25 g. Your daily fiber goal is _____________ g. Shopping Choose whole fruits and vegetables instead of processed forms, such as apple juice or applesauce. Choose a wide variety of high-fiber foods such as avocados, lentils, oats,  and kidney beans. Read the nutrition facts label of the foods you choose. Be aware of foods with added fiber. These foods often have high sugar and sodium amounts per serving. Cooking Use whole-grain flour for baking and cooking. Cook with brown rice instead of white rice. Meal planning Start the day with a breakfast that is high in fiber, such as a cereal that contains 5 g of fiber or more per serving. Eat breads and cereals that are made with whole-grain flour instead of refined flour or white flour. Eat brown rice, bulgur wheat, or millet instead of white rice. Use beans in place of meat in soups, salads, and pasta dishes. Be sure that half of the grains you eat each day are whole grains. General information You can get the recommended daily intake of dietary fiber by: Eating a variety of fruits, vegetables, grains, nuts, and beans. Taking a fiber supplement if you are not able to take in enough fiber in your diet. It is better to get fiber through food than from a supplement. Gradually increase how much fiber you consume. If you increase your intake of dietary fiber too quickly, you may have bloating, cramping, or gas. Drink plenty of water to help you digest fiber. Choose high-fiber snacks, such as berries, raw vegetables, nuts, and popcorn. What foods should I eat? Fruits Berries. Pears. Apples. Oranges. Avocado. Prunes and raisins. Dried figs. Vegetables Sweet potatoes. Spinach. Kale. Artichokes. Cabbage. Broccoli. Cauliflower. Green peas. Carrots. Squash. Grains Whole-grain breads. Multigrain cereal. Oats and oatmeal. Brown rice. Barley. Bulgur wheat. [place]. Quinoa. Bran muffins. Popcorn. Rye wafer crackers. Meats and other proteins Navy beans, kidney beans, and pinto beans. Soybeans. Split peas. Lentils. Nuts and seeds. Dairy Fiber-fortified yogurt. Beverages Fiber-fortified soy milk. Fiber-fortified orange juice. Other foods Fiber bars. The items listed above may not be a  complete list of recommended foods and beverages. Contact a dietitian for more information. What foods should I avoid? Fruits Fruit juice. Cooked, strained fruit. Vegetables Fried potatoes. Canned vegetables. Well-cooked vegetables. Grains White bread. Pasta made with refined flour. White rice. Meats and other proteins Fatty cuts of meat. Fried chicken or fried fish. Dairy Milk. Yogurt. Cream cheese. Sour cream. Fats and oils Butters. Beverages Soft drinks. Other foods Cakes and pastries. The items listed above may not be a complete list of foods and beverages to avoid. Talk with your dietitian about what choices are best for you. Summary Fiber is a type of carbohydrate. It is found in foods such as fruits, vegetables, whole grains, and beans. A high-fiber diet has many benefits. It can help to prevent constipation, lower blood cholesterol, aid weight loss, and reduce your risk of heart disease, diabetes, and certain cancers. Increase your intake of fiber gradually. Increasing fiber too quickly may cause cramping, bloating, and gas. Drink plenty of water while you increase the amount of fiber you consume. The best sources of fiber include whole fruits and vegetables, whole  grains, nuts, seeds, and beans. This information is not intended to replace advice given to you by your health care provider. Make sure you discuss any questions you have with your health care provider. Document Revised: 04/12/2020 Document Reviewed: 04/12/2020 Elsevier Patient Education  2022 [employer].

## 2021-09-18 LAB — COMPREHENSIVE METABOLIC PANEL
ALT: 25 IU/L (ref 0–32)
AST: 20 IU/L (ref 0–40)
Albumin/Globulin Ratio: 1.5 (ref 1.2–2.2)
Albumin: 4.2 g/dL (ref 3.8–4.9)
Alkaline Phosphatase: 124 IU/L — ABNORMAL HIGH (ref 44–121)
BUN/Creatinine Ratio: 14 (ref 9–23)
BUN: 14 mg/dL (ref 6–24)
Bilirubin Total: 0.4 mg/dL (ref 0.0–1.2)
CO2: 23 mmol/L (ref 20–29)
Calcium: 10.5 mg/dL — ABNORMAL HIGH (ref 8.7–10.2)
Chloride: 103 mmol/L (ref 96–106)
Creatinine, Ser: 1.01 mg/dL — ABNORMAL HIGH (ref 0.57–1.00)
Globulin, Total: 2.8 g/dL (ref 1.5–4.5)
Glucose: 120 mg/dL — ABNORMAL HIGH (ref 70–99)
Potassium: 4.2 mmol/L (ref 3.5–5.2)
Sodium: 143 mmol/L (ref 134–144)
Total Protein: 7 g/dL (ref 6.0–8.5)
eGFR: 66 mL/min/{1.73_m2} (ref >59)

## 2021-09-18 LAB — TSH+FREE T4
Free T4: 1.2 ng/dL (ref 0.82–1.77)
TSH: 1.06 u[IU]/mL (ref 0.450–4.500)

## 2021-09-18 LAB — CBC
Hematocrit: 44.4 % (ref 34.0–46.6)
Hemoglobin: 14.7 g/dL (ref 11.1–15.9)
MCH: 29.9 pg (ref 26.6–33.0)
MCHC: 33.1 g/dL (ref 31.5–35.7)
MCV: 90 fL (ref 79–97)
Platelets: 251 10*3/uL (ref 150–450)
RBC: 4.92 x10E6/uL (ref 3.77–5.28)
RDW: 13.2 % (ref 11.7–15.4)
WBC: 5.9 10*3/uL (ref 3.4–10.8)

## 2021-09-18 LAB — HEMOGLOBIN A1C
Est. average glucose Bld gHb Est-mCnc: 177 mg/dL
Hgb A1c MFr Bld: 7.8 % — ABNORMAL HIGH (ref 4.8–5.6)

## 2021-09-23 ENCOUNTER — Other Ambulatory Visit: Payer: Self-pay | Admitting: Gastroenterology

## 2021-09-23 ENCOUNTER — Telehealth: Payer: Self-pay

## 2021-09-23 DIAGNOSIS — K581 Irritable bowel syndrome with constipation: Secondary | ICD-10-CM

## 2021-09-23 MED ORDER — LINACLOTIDE 290 MCG PO CAPS: 290.0000 ug | ORAL_CAPSULE | Freq: Every day | ORAL | 2 refills | Status: DC

## 2021-09-23 MED ORDER — LINACLOTIDE 290 MCG PO CAPS
290.0000 ug | ORAL_CAPSULE | Freq: Every day | ORAL | 2 refills | Status: DC
Start: 2021-09-23 — End: 2021-09-23

## 2021-09-23 NOTE — Telephone Encounter (Signed)
Sent electronically to the pharmacy shredded the printed one

## 2021-09-23 NOTE — Telephone Encounter (Signed)
-----   Message from Lin Landsman, MD sent at 09/23/2021  1:00 PM EDT ----- [name]  Patient states that Linzess samples are working.  I just ordered Linzess 290 MCG and not sure if it went through, looks like the prescription was printed  Could you please check?  Thanks RV

## 2021-09-24 NOTE — Progress Notes (Signed)
09/25/21 10:21 AM   [name] December 04, 1966 [id number]  Referring provider:  [name], [address] [place],  [place] [address] Chief Complaint  Patient presents with   Urinary Retention     HPI: [name] is a 55 y.o.female with a personal history of left renal stone and left adrenal gland mass, who presents today for further evaluation of lower abdominal pain and urinary issues today.  She was last seen in 2021.  She was referred back to urology for lower abdominal pain and urinary symptoms.   She was seen last year for evaluation of a stable adrenal nodule for which she declined further evaluation.  It appeared benign in nature.  She is not doing well today and states that he whole body is in pain. She reports that her pain is the worse in her sacral area. She reports that she had two occurrences of falling in the past.   She reports that she has been experiencing the urge to urinate but when she uses the restroom she does not urinate a large amount. She reports that she experiences pain during urination as well.   She states she drinks a lot of tea especially green tea. She reports that she does not drink soda or water.   She is currently on Linzess for constipation  09/11/2021 ultrasound of abdomen for diffuse pain revealed no findings to explain the patients symptoms.   She recently had a negative urinalysis at her primary care.   PMH: Past Medical History:  Diagnosis Date   Acute diverticulitis    Acute pain of right knee 10/14/2016   Acute renal failure (ARF) ([facility]) 01/26/2019   Asthma    Depression    Diabetes mellitus without complication ([place])    Diverticulitis large intestine 05/12/2020   Hypertension     Surgical History: Past Surgical History:  Procedure Laterality Date   ABDOMINAL HYSTERECTOMY     TUBAL LIGATION      Home Medications:  Allergies as of 09/25/2021   No Known Allergies      Medication List        Accurate as  of September 25, 2021 10:21 AM. If you have any questions, ask your nurse or doctor.          acetaminophen 325 MG tablet Commonly known as: TYLENOL Take 2 tablets (650 mg total) by mouth every 6 (six) hours as needed for mild pain or fever.   albuterol 108 (90 Base) MCG/ACT inhaler Commonly known as: VENTOLIN HFA Inhale 1-2 puffs into the lungs every 6 (six) hours as needed for wheezing or shortness of breath.   albuterol (2.5 MG/3ML) 0.083% nebulizer solution Commonly known as: PROVENTIL Take 3 mLs (2.5 mg total) by nebulization every 2 (two) hours as needed for wheezing or shortness of breath.   aspirin EC 81 MG tablet Take 81 mg by mouth daily.   atorvastatin 10 MG tablet Commonly known as: LIPITOR Take 10 mg by mouth daily.   cyclobenzaprine 5 MG tablet Commonly known as: FLEXERIL TAKE 1 TABLET BY MOUTH NIGHTLY AS NEEDEDFOR MUSCLE SPASMS   diclofenac sodium 1 % Gel Commonly known as: VOLTAREN Apply 2 g topically 4 (four) times daily as needed (pain). What changed: when to take this   DULoxetine 20 MG capsule Commonly known as: CYMBALTA Take by mouth.   gabapentin 600 MG tablet Commonly known as: NEURONTIN Take by mouth.   glipiZIDE 5 MG tablet Commonly known as: Glucotrol Take 1 tablet (  5 mg total) by mouth every morning.   hydrochlorothiazide 25 MG tablet Commonly known as: HYDRODIURIL Take 25 mg by mouth daily.   linaclotide 290 MCG Caps capsule Commonly known as: LINZESS Take 1 capsule (290 mcg total) by mouth daily before breakfast.   loratadine 10 MG tablet Commonly known as: CLARITIN Take 1 tablet (10 mg total) by mouth daily.   losartan 50 MG tablet Commonly known as: COZAAR Take 1 tablet (50 mg total) by mouth daily.   metFORMIN 1000 MG tablet Commonly known as: Glucophage Take 1 tablet (1,000 mg total) by mouth 2 (two) times daily with a meal. See discharge instructions for how to take medicine at first   metoprolol tartrate 50 MG  tablet Commonly known as: LOPRESSOR Take by mouth.   omeprazole 20 MG capsule Commonly known as: PRILOSEC Take 2 capsules (40 mg total) by mouth daily. What changed: how much to take   polyethylene glycol 17 g packet Commonly known as: MIRALAX / GLYCOLAX Take 17 g by mouth 2 (two) times daily.   Rybelsus 7 MG Tabs Generic drug: Semaglutide Take by mouth.   sertraline 50 MG tablet Commonly known as: ZOLOFT Take 1.5 tablets (75 mg total) by mouth every morning.   traMADol 50 MG tablet Commonly known as: ULTRAM Take by mouth every 6 (six) hours as needed.   Ubrogepant 100 MG Tabs Take 100 mg by mouth daily as needed.   Vitamin D3 1.25 MG (50000 UT) Caps Take 1 capsule by mouth once a week.        Allergies: No Known Allergies  Family History: Family History  Problem Relation Age of Onset   Hypertension Mother     Social History:  reports that she quit smoking about 16 years ago. Her smoking use included cigarettes. She has a 30.00 pack-year smoking history. She has never used smokeless tobacco. She reports that she does not drink alcohol and does not use drugs.   Physical Exam: BP (!) 145/102   Pulse 81   Ht 5' 1.5" (1.562 m)   Wt 172 lb (78 kg)   BMI 31.97 kg/m   Constitutional:  Alert and oriented, No acute distress.   HEENT: [place] AT, moist mucus membranes.  Trachea midline, no masses. Cardiovascular: No clubbing, cyanosis, or edema. Respiratory: Normal respiratory effort, no increased work of breathing. Skin: No rashes, bruises or suspicious lesions. Neurologic: Grossly intact, no focal deficits, moving all 4 extremities. Psychiatric: Tearful.  Laboratory Data:  Lab Results  Component Value Date   CREATININE 1.01 (H) 09/17/2021    Lab Results  Component Value Date   HGBA1C 7.8 (H) 09/17/2021    Urinalysis Urinalysis today is negative   Pertinent Imaging: CLINICAL DATA:  Diffuse abdominal pain x2 months worse postprandial   EXAM: ABDOMEN  ULTRASOUND COMPLETE   COMPARISON:  CT May 11, 2020.   FINDINGS: Gallbladder: No gallstones or wall thickening visualized. No sonographic Murphy sign noted by sonographer.   Common bile duct: Diameter: 3 mm   Liver: No focal lesion identified. Within normal limits in parenchymal echogenicity. Portal vein is patent on color Doppler imaging with normal direction of blood flow towards the liver.   IVC: No abnormality visualized.   Pancreas: Visualized portion unremarkable.   Spleen: Size and appearance within normal limits.   Right Kidney: Length: 10.2 cm. Echogenicity within normal limits. No mass or hydronephrosis visualized.   Left Kidney: Length: 10.9 cm. Echogenicity within normal limits. No hydronephrosis. Renal cysts measuring up to  2.4 cm.   Abdominal aorta: No aneurysm visualized.   Other findings: None.   IMPRESSION: No ultrasound findings to explain the patient's symptoms.     Electronically Signed   By: Dahlia Bailiff M.D.   On: 09/11/2021 16:54  MRI personally reviewed, no GU pathology, agree with radiologic interpretation.  Results for orders placed or performed in visit on 09/25/21  BLADDER SCAN AMB NON-IMAGING  Result Value Ref Range   Scan Result 0 ml     Assessment & Plan:    Urinary urgency  - suspect the she has an overactivity of bladder she is emptying adequately  -No evidence of UTI as contributing factor - She was given trial Myrbetriq 25 mg today.  - prefer to avoid anticholinergics due to her severe constipation as highlighted below   2. Chronic constipation  - She is currently on linzess she is undergoing treatment for this and observation.  She has a colonoscopy and endoscopy scheduled with Dr. Marius Ditch. I agree that this may be a large contributing factor to her urinary symptoms.   F/u 1 month for PVR/reassess urinary symptoms  I,Kailey Littlejohn,acting as a scribe for Hollice Espy, MD.,have documented all relevant documentation on  the behalf of Hollice Espy, MD,as directed by  Hollice Espy, MD while in the presence of Hollice Espy, MD.  I have reviewed the above documentation for accuracy and completeness, and I agree with the above.   Hollice Espy, MD   [facility] [address] [place], [place] [address] [phone]

## 2021-09-25 ENCOUNTER — Other Ambulatory Visit: Payer: Self-pay

## 2021-09-25 ENCOUNTER — Ambulatory Visit (INDEPENDENT_AMBULATORY_CARE_PROVIDER_SITE_OTHER): Payer: 59 | Admitting: Urology

## 2021-09-25 VITALS — BP 145/102 | HR 81 | Ht 61.5 in | Wt 172.0 lb

## 2021-09-25 DIAGNOSIS — R3915 Urgency of urination: Secondary | ICD-10-CM

## 2021-09-25 DIAGNOSIS — K5909 Other constipation: Secondary | ICD-10-CM | POA: Diagnosis not present

## 2021-09-25 DIAGNOSIS — R39198 Other difficulties with micturition: Secondary | ICD-10-CM

## 2021-09-25 LAB — BLADDER SCAN AMB NON-IMAGING: Scan Result: 0

## 2021-09-26 LAB — URINALYSIS, COMPLETE
Bilirubin, UA: NEGATIVE
Glucose, UA: NEGATIVE
Ketones, UA: NEGATIVE
Leukocytes,UA: NEGATIVE
Nitrite, UA: NEGATIVE
RBC, UA: NEGATIVE
Specific Gravity, UA: 1.025 (ref 1.005–1.030)
Urobilinogen, Ur: 0.2 mg/dL (ref 0.2–1.0)
pH, UA: 5.5 (ref 5.0–7.5)

## 2021-09-26 LAB — MICROSCOPIC EXAMINATION

## 2021-09-30 ENCOUNTER — Encounter: Payer: Self-pay | Admitting: General Surgery

## 2021-10-02 ENCOUNTER — Ambulatory Visit: Payer: 59 | Admitting: Certified Registered"

## 2021-10-02 ENCOUNTER — Encounter
Admission: RE | Disposition: A | Discharge status: Home / Self Care | Payer: Self-pay | Source: Home / Self Care | Attending: Gastroenterology

## 2021-10-02 ENCOUNTER — Encounter: Payer: Self-pay | Admitting: Gastroenterology

## 2021-10-02 ENCOUNTER — Ambulatory Visit
Admission: RE | Admit: 2021-10-02 | Discharge: 2021-10-02 | Disposition: A | Discharge status: Home / Self Care | Payer: 59 | Attending: Gastroenterology | Admitting: Gastroenterology

## 2021-10-02 DIAGNOSIS — R140 Abdominal distension (gaseous): Secondary | ICD-10-CM | POA: Diagnosis not present

## 2021-10-02 DIAGNOSIS — Z87891 Personal history of nicotine dependence: Secondary | ICD-10-CM | POA: Insufficient documentation

## 2021-10-02 DIAGNOSIS — Z8249 Family history of ischemic heart disease and other diseases of the circulatory system: Secondary | ICD-10-CM | POA: Diagnosis not present

## 2021-10-02 DIAGNOSIS — R1030 Lower abdominal pain, unspecified: Secondary | ICD-10-CM

## 2021-10-02 DIAGNOSIS — Z79899 Other long term (current) drug therapy: Secondary | ICD-10-CM | POA: Insufficient documentation

## 2021-10-02 DIAGNOSIS — K2950 Unspecified chronic gastritis without bleeding: Secondary | ICD-10-CM | POA: Insufficient documentation

## 2021-10-02 DIAGNOSIS — D123 Benign neoplasm of transverse colon: Secondary | ICD-10-CM | POA: Diagnosis not present

## 2021-10-02 DIAGNOSIS — R1084 Generalized abdominal pain: Secondary | ICD-10-CM | POA: Insufficient documentation

## 2021-10-02 DIAGNOSIS — K5730 Diverticulosis of large intestine without perforation or abscess without bleeding: Secondary | ICD-10-CM | POA: Insufficient documentation

## 2021-10-02 DIAGNOSIS — D369 Benign neoplasm, unspecified site: Secondary | ICD-10-CM

## 2021-10-02 DIAGNOSIS — R194 Change in bowel habit: Secondary | ICD-10-CM | POA: Diagnosis not present

## 2021-10-02 DIAGNOSIS — Z791 Long term (current) use of non-steroidal anti-inflammatories (NSAID): Secondary | ICD-10-CM | POA: Diagnosis not present

## 2021-10-02 DIAGNOSIS — K635 Polyp of colon: Secondary | ICD-10-CM | POA: Diagnosis not present

## 2021-10-02 DIAGNOSIS — K644 Residual hemorrhoidal skin tags: Secondary | ICD-10-CM | POA: Diagnosis not present

## 2021-10-02 DIAGNOSIS — K31A11 Gastric intestinal metaplasia without dysplasia, involving the antrum: Secondary | ICD-10-CM | POA: Insufficient documentation

## 2021-10-02 DIAGNOSIS — D124 Benign neoplasm of descending colon: Secondary | ICD-10-CM | POA: Insufficient documentation

## 2021-10-02 DIAGNOSIS — R634 Abnormal weight loss: Secondary | ICD-10-CM

## 2021-10-02 DIAGNOSIS — E119 Type 2 diabetes mellitus without complications: Secondary | ICD-10-CM | POA: Diagnosis not present

## 2021-10-02 DIAGNOSIS — K5904 Chronic idiopathic constipation: Secondary | ICD-10-CM

## 2021-10-02 DIAGNOSIS — Z7984 Long term (current) use of oral hypoglycemic drugs: Secondary | ICD-10-CM | POA: Insufficient documentation

## 2021-10-02 DIAGNOSIS — D120 Benign neoplasm of cecum: Secondary | ICD-10-CM | POA: Insufficient documentation

## 2021-10-02 HISTORY — PX: COLONOSCOPY WITH PROPOFOL: SHX5780

## 2021-10-02 HISTORY — PX: ESOPHAGOGASTRODUODENOSCOPY: SHX5428

## 2021-10-02 LAB — GLUCOSE, CAPILLARY: Glucose-Capillary: 138 mg/dL — ABNORMAL HIGH (ref 70–99)

## 2021-10-02 SURGERY — COLONOSCOPY WITH PROPOFOL
Anesthesia: General

## 2021-10-02 MED ORDER — DEXMEDETOMIDINE (PRECEDEX) IN NS 20 MCG/5ML (4 MCG/ML) IV SYRINGE
PREFILLED_SYRINGE | INTRAVENOUS | Status: DC | PRN
  Administered 2021-10-02: 8 ug via INTRAVENOUS
  Administered 2021-10-02: 4 ug via INTRAVENOUS

## 2021-10-02 MED ORDER — PROPOFOL 10 MG/ML IV BOLUS
INTRAVENOUS | Status: DC | PRN
  Administered 2021-10-02: 70 mg via INTRAVENOUS
  Administered 2021-10-02: 50 mg via INTRAVENOUS
  Administered 2021-10-02: 30 mg via INTRAVENOUS

## 2021-10-02 MED ORDER — GLYCOPYRROLATE 0.2 MG/ML IJ SOLN
INTRAMUSCULAR | Status: DC | PRN
  Administered 2021-10-02: .2 mg via INTRAVENOUS

## 2021-10-02 MED ORDER — SODIUM CHLORIDE 0.9 % IV SOLN: INTRAVENOUS | Status: DC

## 2021-10-02 MED ORDER — PROPOFOL 10 MG/ML IV BOLUS
INTRAVENOUS | Status: AC
  Filled 2021-10-02: qty 20

## 2021-10-02 MED ORDER — PROPOFOL 500 MG/50ML IV EMUL
INTRAVENOUS | Status: DC | PRN
  Administered 2021-10-02: 120 ug/kg/min via INTRAVENOUS

## 2021-10-02 MED ORDER — PROPOFOL 10 MG/ML IV BOLUS
INTRAVENOUS | Status: AC
  Filled 2021-10-02: qty 40

## 2021-10-02 MED ORDER — ONDANSETRON HCL 4 MG/2ML IJ SOLN
INTRAMUSCULAR | Status: DC | PRN
  Administered 2021-10-02: 4 mg via INTRAVENOUS

## 2021-10-02 MED ORDER — LIDOCAINE HCL (CARDIAC) PF 100 MG/5ML IV SOSY
PREFILLED_SYRINGE | INTRAVENOUS | Status: DC | PRN
  Administered 2021-10-02: 80 mg via INTRAVENOUS

## 2021-10-02 NOTE — Anesthesia Preprocedure Evaluation (Signed)
Anesthesia Evaluation  Patient identified by MRN, date of birth, ID band Patient awake    Reviewed: Allergy & Precautions, NPO status , Patient's Chart, lab work & pertinent test results  History of Anesthesia Complications Negative for: history of anesthetic complications  Airway Mallampati: I   Neck ROM: Full    Dental no notable dental hx.    Pulmonary asthma , former smoker (quit 2006),    Pulmonary exam normal breath sounds clear to auscultation       Cardiovascular hypertension, Normal cardiovascular exam Rhythm:Regular Rate:Normal     Neuro/Psych negative neurological ROS     GI/Hepatic negative GI ROS,   Endo/Other  diabetes, Type 2  Renal/GU Renal disease (nephrolithiasis)     Musculoskeletal   Abdominal   Peds  Hematology negative hematology ROS (+)   Anesthesia Other Findings   Reproductive/Obstetrics                             Anesthesia Physical Anesthesia Plan  ASA: 2  Anesthesia Plan: General   Post-op Pain Management:    Induction: Intravenous  PONV Risk Score and Plan: 3 and Propofol infusion, TIVA and Treatment may vary due to age or medical condition  Airway Management Planned: Natural Airway  Additional Equipment:   Intra-op Plan:   Post-operative Plan:   Informed Consent: I have reviewed the patients History and Physical, chart, labs and discussed the procedure including the risks, benefits and alternatives for the proposed anesthesia with the patient or authorized representative who has indicated his/her understanding and acceptance.       Plan Discussed with: CRNA  Anesthesia Plan Comments:         Anesthesia Quick Evaluation

## 2021-10-02 NOTE — Anesthesia Postprocedure Evaluation (Signed)
Anesthesia Post Note  Patient: [name]  Procedure(s) Performed: COLONOSCOPY WITH PROPOFOL ESOPHAGOGASTRODUODENOSCOPY (EGD)  Patient location during evaluation: PACU Anesthesia Type: General Level of consciousness: awake and alert, oriented and patient cooperative Pain management: pain level controlled Vital Signs Assessment: post-procedure vital signs reviewed and stable Respiratory status: spontaneous breathing, nonlabored ventilation and respiratory function stable Cardiovascular status: blood pressure returned to baseline and stable Postop Assessment: adequate PO intake Anesthetic complications: no   No notable events documented.   Last Vitals:  Vitals:   10/02/21 1140 10/02/21 1150  BP: (!) 170/119 (!) 171/91  Pulse: 78   Resp: 18   Temp:    SpO2: 93%     Last Pain:  Vitals:   10/02/21 1150  TempSrc:   PainSc: 0-No pain                 Darrin Nipper

## 2021-10-02 NOTE — Op Note (Signed)
[facility] Patient Name: [name] Procedure Date: 10/02/2021 10:01 AM MRN: [id number] Account #: [id number] Date of Birth: Oct 06, 1966 Admit Type: Outpatient Age: 55 Room: [facility] ENDO ROOM 4 Gender: Female Note Status: Finalized Instrument Name: Upper Endoscope [id number] Procedure:             Upper GI endoscopy Indications:           Abdominal bloating Providers:             Lin Landsman MD, MD Referring MD:          [name], MD (Referring MD) Medicines:             General Anesthesia Complications:         No immediate complications. Estimated blood loss: None. Procedure:             Pre-Anesthesia Assessment:                        - Prior to the procedure, a History and Physical was                         performed, and patient medications and allergies were                         reviewed. The patient is competent. The risks and                         benefits of the procedure and the sedation options and                         risks were discussed with the patient. All questions                         were answered and informed consent was obtained.                         Patient identification and proposed procedure were                         verified by the physician, the nurse, the                         anesthesiologist, the anesthetist and the technician                         in the pre-procedure area in the procedure room in the                         endoscopy suite. Mental Status Examination: alert and                         oriented. Airway Examination: normal oropharyngeal                         airway and neck mobility. Respiratory Examination:                         clear to auscultation. CV Examination: normal.  Prophylactic Antibiotics: The patient does not require                         prophylactic antibiotics. Prior Anticoagulants: The                         patient has  taken no previous anticoagulant or                         antiplatelet agents. ASA Grade Assessment: II - A                         patient with mild systemic disease. After reviewing                         the risks and benefits, the patient was deemed in                         satisfactory condition to undergo the procedure. The                         anesthesia plan was to use general anesthesia.                         Immediately prior to administration of medications,                         the patient was re-assessed for adequacy to receive                         sedatives. The heart rate, respiratory rate, oxygen                         saturations, blood pressure, adequacy of pulmonary                         ventilation, and response to care were monitored                         throughout the procedure. The physical status of the                         patient was re-assessed after the procedure.                        After obtaining informed consent, the endoscope was                         passed under direct vision. Throughout the procedure,                         the patient's blood pressure, pulse, and oxygen                         saturations were monitored continuously. The Endoscope                         was introduced through the mouth, and advanced to the  second part of duodenum. The upper GI endoscopy was                         accomplished without difficulty. The patient tolerated                         the procedure well. Findings:      The duodenal bulb and second portion of the duodenum were normal.      Diffuse mildly erythematous mucosa without bleeding was found in the       gastric body and in the gastric antrum. Biopsies were taken with a cold       forceps for Helicobacter pylori testing.      The cardia and gastric fundus were normal on retroflexion.      The gastroesophageal junction and examined esophagus were  normal.      Esophagogastric landmarks were identified: the gastroesophageal junction       was found at 39 cm from the incisors. Impression:            - Normal duodenal bulb and second portion of the                         duodenum.                        - Erythematous mucosa in the gastric body and antrum.                         Biopsied.                        - Normal gastroesophageal junction and esophagus.                        - Esophagogastric landmarks identified. Recommendation:        - Await pathology results.                        - Proceed with colonoscopy as scheduled                        See colonoscopy report Procedure Code(s):     --- Professional ---                        [phone], Esophagogastroduodenoscopy, flexible,                         transoral; with biopsy, single or multiple Diagnosis Code(s):     --- Professional ---                        K31.89, Other diseases of stomach and duodenum                        R14.0, Abdominal distension (gaseous) CPT copyright 2019 American Medical Association. All rights reserved. The codes documented in this report are preliminary and upon coder review may  be revised to meet current compliance requirements. Dr. Ulyess Mort Lin Landsman MD, MD 10/02/2021 10:25:48 AM This report has been signed electronically. Number of Addenda: 0 Note Initiated On: 10/02/2021 10:01 AM Estimated Blood Loss:  Estimated blood loss: none.      [facility]

## 2021-10-02 NOTE — H&P (Signed)
Cephas Darby, MD [address]  [place]  [place], [place] [address]  Main: [phone]  Fax: [phone] Pager: [phone]  Primary Care Physician:  [name], MD Primary Gastroenterologist:  Dr. Cephas Darby  Pre-Procedure History & Physical: HPI:  [name] is a 55 y.o. female is here for an endoscopy and colonoscopy.   Past Medical History:  Diagnosis Date   Acute diverticulitis    Acute pain of right knee 10/14/2016   Acute renal failure (ARF) ([place]) 01/26/2019   Asthma    Depression    Diabetes mellitus without complication ([place])    Diverticulitis large intestine 05/12/2020   Hypertension     Past Surgical History:  Procedure Laterality Date   ABDOMINAL HYSTERECTOMY     TUBAL LIGATION      Prior to Admission medications   Medication Sig Start Date End Date Taking? Authorizing Provider  acetaminophen (TYLENOL) 325 MG tablet Take 2 tablets (650 mg total) by mouth every 6 (six) hours as needed for mild pain or fever. 05/13/20   Lorella Nimrod, MD  albuterol (PROVENTIL HFA;VENTOLIN HFA) 108 (90 Base) MCG/ACT inhaler Inhale 1-2 puffs into the lungs every 6 (six) hours as needed for wheezing or shortness of breath. 01/28/19   Lule, Sara Chu, PA  albuterol (PROVENTIL) (2.5 MG/3ML) 0.083% nebulizer solution Take 3 mLs (2.5 mg total) by nebulization every 2 (two) hours as needed for wheezing or shortness of breath. 01/28/19   Ripley Fraise, PA  aspirin EC 81 MG tablet Take 81 mg by mouth daily.    [provider]  atorvastatin (LIPITOR) 10 MG tablet Take 10 mg by mouth daily.    [provider]  Cholecalciferol (VITAMIN D3) 1.25 MG (50000 UT) CAPS Take 1 capsule by mouth once a week. 02/09/20   [provider]  cyclobenzaprine (FLEXERIL) 5 MG tablet TAKE 1 TABLET BY MOUTH NIGHTLY AS NEEDEDFOR MUSCLE SPASMS 11/01/20   [provider]  diclofenac sodium (VOLTAREN) 1 % GEL Apply 2 g topically 4 (four) times daily as needed  (pain). Patient taking differently: Apply 2 g topically in the morning, at noon, in the evening, and at bedtime. 01/28/19   Ripley Fraise, PA  DULoxetine (CYMBALTA) 20 MG capsule Take by mouth. 07/11/21 07/11/22  [provider]  gabapentin (NEURONTIN) 600 MG tablet Take by mouth. 09/17/20   [provider]  glipiZIDE (GLUCOTROL) 5 MG tablet Take 1 tablet (5 mg total) by mouth every morning. 01/28/19   Ripley Fraise, PA  hydrochlorothiazide (HYDRODIURIL) 25 MG tablet Take 25 mg by mouth daily. 02/09/20   [provider]  linaclotide Rolan Lipa) 290 MCG CAPS capsule Take 1 capsule (290 mcg total) by mouth daily before breakfast. 09/23/21 06/20/22  Lin Landsman, MD  loratadine (CLARITIN) 10 MG tablet Take 1 tablet (10 mg total) by mouth daily. 01/28/19   Ripley Fraise, PA  losartan (COZAAR) 50 MG tablet Take 1 tablet (50 mg total) by mouth daily. 01/28/19   Ripley Fraise, PA  metFORMIN (GLUCOPHAGE) 1000 MG tablet Take 1 tablet (1,000 mg total) by mouth 2 (two) times daily with a meal. See discharge instructions for how to take medicine at first 01/28/19 05/11/20  Ripley Fraise, PA  metoprolol tartrate (LOPRESSOR) 50 MG tablet Take by mouth.    [provider]  omeprazole (PRILOSEC) 20 MG capsule Take 2 capsules (40 mg total) by mouth daily. Patient taking differently: Take 20 mg by mouth daily. 01/28/19   Ripley Fraise, PA  polyethylene  glycol (MIRALAX / GLYCOLAX) 17 g packet Take 17 g by mouth 2 (two) times daily. 05/13/20   Lorella Nimrod, MD  Semaglutide (RYBELSUS) 7 MG TABS Take by mouth.    [provider]  sertraline (ZOLOFT) 50 MG tablet Take 1.5 tablets (75 mg total) by mouth every morning. 01/28/19   Ripley Fraise, PA  traMADol (ULTRAM) 50 MG tablet Take by mouth every 6 (six) hours as needed.    [provider]  Ubrogepant 100 MG TABS Take 100 mg by mouth daily as needed. 05/03/20   [provider]    Allergies as of 09/17/2021   (No Known Allergies)     Family History  Problem Relation Age of Onset   Hypertension Mother     Social History   Socioeconomic History   Marital status: Single    Spouse name: Not on file   Number of children: Not on file   Years of education: Not on file   Highest education level: Not on file  Occupational History   Not on file  Tobacco Use   Smoking status: Former    Packs/day: 3.00    Years: 10.00    Pack years: 30.00    Types: Cigarettes    Quit date: 12/22/2004    Years since quitting: 16.7   Smokeless tobacco: Never  Vaping Use   Vaping Use: Never used  Substance and Sexual Activity   Alcohol use: No   Drug use: No   Sexual activity: Not Currently  Other Topics Concern   Not on file  Social History Narrative   [facility] (07/20/17):   No bird or mold exposure. No recent travel.    Social Determinants of Health   Financial Resource Strain: Not on file  Food Insecurity: Not on file  Transportation Needs: Not on file  Physical Activity: Not on file  Stress: Not on file  Social Connections: Not on file  Intimate Partner Violence: Not on file    Review of Systems: See HPI, otherwise negative ROS  Physical Exam: BP (!) 153/109   Pulse 66   Temp (!) 96.7 F (35.9 C) (Temporal)   Resp 18   Ht 5' 1.5" (1.562 m)   Wt 78 kg   SpO2 100%   BMI 31.97 kg/m  General:   Alert,  pleasant and cooperative in NAD Head:  Normocephalic and atraumatic. Neck:  Supple; no masses or thyromegaly. Lungs:  Clear throughout to auscultation.    Heart:  Regular rate and rhythm. Abdomen:  Soft, nontender and nondistended. Normal bowel sounds, without guarding, and without rebound.   Neurologic:  Alert and  oriented x4;  grossly normal neurologically.  Impression/Plan: [name] is here for an endoscopy and colonoscopy to be performed for chronic lower abdominal pain, abdominal bloating and irregular bowel habits.   Risks, benefits, limitations, and alternatives regarding   endoscopy and colonoscopy have been reviewed with the patient.  Questions have been answered.  All parties agreeable.   Sherri Sear, MD  10/02/2021, 10:01 AM

## 2021-10-02 NOTE — Op Note (Signed)
[facility] Gastroenterology Patient Name: [name] Procedure Date: 10/02/2021 10:01 AM MRN: [id number] Account #: [id number] Date of Birth: 09-01-1966 Admit Type: Outpatient Age: 55 Room: [facility] ENDO ROOM 4 Gender: Female Note Status: Finalized Instrument Name: Colonoscope [id number] Procedure:             Colonoscopy Indications:           This is the patient's first colonoscopy, Generalized                         abdominal pain, Change in bowel habits Providers:             Lin Landsman MD, MD Referring MD:          [name], MD (Referring MD) Medicines:             General Anesthesia Complications:         No immediate complications. Estimated blood loss: None. Procedure:             Pre-Anesthesia Assessment:                        - Prior to the procedure, a History and Physical was                         performed, and patient medications and allergies were                         reviewed. The patient is competent. The risks and                         benefits of the procedure and the sedation options and                         risks were discussed with the patient. All questions                         were answered and informed consent was obtained.                         Patient identification and proposed procedure were                         verified by the physician, the nurse, the                         anesthesiologist, the anesthetist and the technician                         in the pre-procedure area in the procedure room in the                         endoscopy suite. Mental Status Examination: alert and                         oriented. Airway Examination: normal oropharyngeal                         airway and neck mobility. Respiratory Examination:  clear to auscultation. CV Examination: normal.                         Prophylactic Antibiotics: The patient does not require                          prophylactic antibiotics. Prior Anticoagulants: The                         patient has taken no previous anticoagulant or                         antiplatelet agents. ASA Grade Assessment: II - A                         patient with mild systemic disease. After reviewing                         the risks and benefits, the patient was deemed in                         satisfactory condition to undergo the procedure. The                         anesthesia plan was to use general anesthesia.                         Immediately prior to administration of medications,                         the patient was re-assessed for adequacy to receive                         sedatives. The heart rate, respiratory rate, oxygen                         saturations, blood pressure, adequacy of pulmonary                         ventilation, and response to care were monitored                         throughout the procedure. The physical status of the                         patient was re-assessed after the procedure.                        After obtaining informed consent, the colonoscope was                         passed under direct vision. Throughout the procedure,                         the patient's blood pressure, pulse, and oxygen                         saturations were monitored continuously. The  Colonoscope was introduced through the anus and                         advanced to the the cecum, identified by appendiceal                         orifice and ileocecal valve. The colonoscopy was                         unusually difficult due to inadequate bowel prep,                         significant looping and the patient's body habitus.                         Successful completion of the procedure was aided by                         changing the patient to a prone position, applying                         abdominal pressure and lavage. The patient tolerated                          the procedure well. The quality of the bowel                         preparation was fair. Findings:      The perianal and digital rectal examinations were normal. Pertinent       negatives include normal sphincter tone and no palpable rectal lesions.      A 12 mm polyp was found in the cecum. The polyp was sessile. The polyp       was removed with a hot snare. Resection and retrieval were complete. To       prevent bleeding after the polypectomy, two hemostatic clips were       successfully placed (MR conditional). There was no bleeding during, or       at the end, of the procedure.      A 15 mm polyp was found in the transverse colon. The polyp was       pedunculated. The polyp was removed with a hot snare. Resection and       retrieval were complete. To prevent bleeding after the polypectomy, one       hemostatic clip was successfully placed (MR conditional). There was no       bleeding during, or at the end, of the procedure.      A 15 mm polyp was found in the descending colon. The polyp was sessile.       The polyp was removed with a hot snare. Resection and retrieval were       complete. To prevent bleeding after the polypectomy, two hemostatic       clips were successfully placed (MR conditional). There was no bleeding       during, or at the end, of the procedure.      Copious quantities of liquid semi-liquid stool was found in the entire       colon, precluding visualization.      Non-bleeding external hemorrhoids were found during retroflexion.  The       hemorrhoids were large.      Many diverticula were found in the sigmoid colon, descending colon and       ascending colon. Impression:            - Preparation of the colon was fair.                        - One 12 mm polyp in the cecum, removed with a hot                         snare. Resected and retrieved. Clips (MR conditional)                         were placed.                        - One 15 mm polyp in  the transverse colon, removed                         with a hot snare. Resected and retrieved. Clip (MR                         conditional) was placed.                        - One 15 mm polyp in the descending colon, removed                         with a hot snare. Resected and retrieved. Clips (MR                         conditional) were placed.                        - Stool in the entire examined colon.                        - Non-bleeding external hemorrhoids.                        - Diverticulosis in the sigmoid colon, in the                         descending colon and in the ascending colon. Recommendation:        - Discharge patient to home (with escort).                        - Resume previous diet today.                        - Continue present medications.                        - Await pathology results.                        - Repeat colonoscopy in 6 months with 2day prep  because the bowel preparation was poor, for                         surveillance based on pathology results and for                         surveillance of multiple polyps. Procedure Code(s):     --- Professional ---                        [phone], Colonoscopy, flexible; with removal of                         tumor(s), polyp(s), or other lesion(s) by snare                         technique Diagnosis Code(s):     --- Professional ---                        K64.4, Residual hemorrhoidal skin tags                        K63.5, Polyp of colon                        R10.84, Generalized abdominal pain                        R19.4, Change in bowel habit                        K57.30, Diverticulosis of large intestine without                         perforation or abscess without bleeding CPT copyright 2019 [employer]. All rights reserved. The codes documented in this report are preliminary and upon coder review may  be revised to meet current compliance  requirements. Dr. Ulyess Mort Lin Landsman MD, MD 10/02/2021 11:10:21 AM This report has been signed electronically. Number of Addenda: 0 Note Initiated On: 10/02/2021 10:01 AM Scope Withdrawal Time: 0 hours 26 minutes 50 seconds  Total Procedure Duration: 0 hours 33 minutes 5 seconds  Estimated Blood Loss:  Estimated blood loss: none.      [facility]

## 2021-10-02 NOTE — Transfer of Care (Signed)
Immediate Anesthesia Transfer of Care Note  Patient: [name]  Procedure(s) Performed: COLONOSCOPY WITH PROPOFOL ESOPHAGOGASTRODUODENOSCOPY (EGD)  Patient Location: PACU and Endoscopy Unit  Anesthesia Type:General  Level of Consciousness: drowsy  Airway & Oxygen Therapy: Patient Spontanous Breathing  Post-op Assessment: Report given to RN  Post vital signs: stable  Last Vitals:  Vitals Value Taken Time  BP 114/80 10/02/21 1110  Temp    Pulse 95 10/02/21 1113  Resp 17 10/02/21 1113  SpO2 100 % 10/02/21 1113  Vitals shown include unvalidated device data.  Last Pain:  Vitals:   10/02/21 0940  TempSrc: Temporal         Complications: No notable events documented.

## 2021-10-03 ENCOUNTER — Encounter: Payer: Self-pay | Admitting: Gastroenterology

## 2021-10-03 ENCOUNTER — Other Ambulatory Visit: Payer: Self-pay

## 2021-10-04 ENCOUNTER — Telehealth: Payer: Self-pay

## 2021-10-04 DIAGNOSIS — M79642 Pain in left hand: Secondary | ICD-10-CM

## 2021-10-04 LAB — SURGICAL PATHOLOGY

## 2021-10-04 NOTE — Telephone Encounter (Signed)
-----   Message from Lin Landsman, MD sent at 10/03/2021  4:20 PM EDT ----- Regarding: Referral Please refer patient to orthopedics Dx: Left hand pain, concern for biceps tendon rupture  Thanks RV

## 2021-10-04 NOTE — Telephone Encounter (Signed)
Placed referral  

## 2021-10-10 ENCOUNTER — Other Ambulatory Visit: Payer: Self-pay

## 2021-10-10 ENCOUNTER — Encounter: Payer: Self-pay | Admitting: Orthopedic Surgery

## 2021-10-10 ENCOUNTER — Ambulatory Visit (INDEPENDENT_AMBULATORY_CARE_PROVIDER_SITE_OTHER): Payer: 59 | Admitting: Orthopedic Surgery

## 2021-10-10 ENCOUNTER — Ambulatory Visit: Payer: 59 | Admitting: Orthopedic Surgery

## 2021-10-10 DIAGNOSIS — M79642 Pain in left hand: Secondary | ICD-10-CM | POA: Diagnosis not present

## 2021-10-10 NOTE — Progress Notes (Signed)
Office Visit Note   Patient: [name]           Date of Birth: 06-Apr-1966           MRN: [id number] Visit Date: 10/10/2021              Requested by: Lin Landsman, MD [address] [name],  [place] [address] PCP: [name], MD   Assessment & Plan: Visit Diagnoses:  1. Pain in left hand     Plan: Discussed with patient that her biggest issue seems to be limited use of her hand secondary to weakness and pain.  She denies any injury to her hand.  She is able to make a complete fist and fully extend all of her fingers and has full range of motion of her wrist.  This takes significant coaching.  Discussed that this is unlikely to be related to her previous distal biceps rupture.  Regarding her biceps tear, she has full flexion extension of the elbow and full previous supination of the forearm.  She has no real tenderness to palpation at the biceps muscle or the biceps tendon.  Discussed conservative treatment options for this issue.  After discussion she wants to try an anti-inflammatory medication and hand therapy to work on hand range of motion and strengthening.  I can see her back in the office after a course of therapy.  Follow-Up Instructions: No follow-ups on file.   Orders:  No orders of the defined types were placed in this encounter.  No orders of the defined types were placed in this encounter.     Procedures: No procedures performed   Clinical Data: No additional findings.   Subjective: Chief Complaint  Patient presents with   Left Elbow - Pain   Left Hand - Pain    This is a 55 year old female who presents with left hand pain for over a year.  She says this all started when she fell in 2019 in the shower.  She was told that she had a distal biceps rupture.  She has not had any treatment for this.  She describes numbness and tingling in her fingertips that has been contributed to her diabetic neuropathy.  She describes that she has difficulty  opening her fingers or making a complete fist.  In order to do this, she does describes this performance in which she manually extends her fingers and manually flexes her fingers after which she is able to do it on her own.  She does not have any triggering of any of her fingers.  She does describe some elbow pain in the area of the biceps but she has no pain today.  She will take gabapentin and tramadol for these issues.  She does not take any anti-inflammatory medications.  Her biggest issue is limited use of this hand.   Review of Systems   Objective: Vital Signs: There were no vitals taken for this visit.  Physical Exam Constitutional:      Appearance: Normal appearance.  Cardiovascular:     Rate and Rhythm: Normal rate.     Pulses: Normal pulses.  Skin:    General: Skin is warm and dry.     Capillary Refill: Capillary refill takes less than 2 seconds.  Neurological:     Mental Status: She is alert.    Left Hand Exam   Tenderness  Left hand tenderness location: TTP in middle of palm.   Other  Erythema: absent Sensation: decreased Pulse:  present  Comments:  She initially holds her fingers partially flexed like she's holding a ball.  She is able to fully extend her fingers, however, she does this performance in which she manually extends them first after which she is able to make a fist and extend her fingers again.  She has no palpable or visible triggering of these fingers.  She has full range of motion of her wrist although this takes some coaching.  She is able to fully flex and extend her elbow and has full pro supination of the elbow.  She does not have a Tinel's sign at the wrist.  She has no intrinsic atrophy and has full finger abduction abduction.     Specialty Comments:  No specialty comments available.  Imaging: No results found.   PMFS History: Patient Active Problem List   Diagnosis Date Noted   Pain in left hand 10/10/2021   Abdominal bloating    Loss  of weight    Lower abdominal pain    Polyp of colon    Multiple adenomatous polyps    Depression    HLD (hyperlipidemia)    GERD (gastroesophageal reflux disease)    Influenza A 01/26/2019   Marijuana use 06/12/2017   Diabetic neuropathy ([name]) 01/07/2016   Plantar fasciitis of left foot 11/26/2015   Back pain 10/10/2015   Asthma 09/29/2015   Essential hypertension 09/29/2015   Diabetes mellitus without complication ([place]) 41/93/7902   Past Medical History:  Diagnosis Date   Acute diverticulitis    Acute pain of right knee 10/14/2016   Acute renal failure (ARF) ([name]) 01/26/2019   Asthma    Depression    Diabetes mellitus without complication ([name])    Diverticulitis large intestine 05/12/2020   Hypertension     Family History  Problem Relation Age of Onset   Hypertension Mother     Past Surgical History:  Procedure Laterality Date   ABDOMINAL HYSTERECTOMY     COLONOSCOPY WITH PROPOFOL N/A 10/02/2021   Procedure: COLONOSCOPY WITH PROPOFOL;  Surgeon: Lin Landsman, MD;  Location: [facility] ENDOSCOPY;  Service: Gastroenterology;  Laterality: N/A;   ESOPHAGOGASTRODUODENOSCOPY N/A 10/02/2021   Procedure: ESOPHAGOGASTRODUODENOSCOPY (EGD);  Surgeon: Lin Landsman, MD;  Location: [name] ENDOSCOPY;  Service: Gastroenterology;  Laterality: N/A;   TUBAL LIGATION     Social History   Occupational History   Not on file  Tobacco Use   Smoking status: Former    Packs/day: 3.00    Years: 10.00    Pack years: 30.00    Types: Cigarettes    Quit date: 12/22/2004    Years since quitting: 16.8   Smokeless tobacco: Never  Vaping Use   Vaping Use: Never used  Substance and Sexual Activity   Alcohol use: No   Drug use: No   Sexual activity: Not Currently

## 2021-10-15 ENCOUNTER — Telehealth: Payer: Self-pay

## 2021-10-15 NOTE — Telephone Encounter (Signed)
called pharmacy and  called [facility] patient has family planning medicaid only for insurance so it wont cover her [id number] 366 medicine she will have to pay out of pocket or apply for regular medicaid

## 2021-10-16 ENCOUNTER — Other Ambulatory Visit: Payer: Self-pay

## 2021-10-16 ENCOUNTER — Ambulatory Visit (INDEPENDENT_AMBULATORY_CARE_PROVIDER_SITE_OTHER): Payer: 59 | Admitting: Gastroenterology

## 2021-10-16 ENCOUNTER — Encounter: Payer: Self-pay | Admitting: Gastroenterology

## 2021-10-16 VITALS — BP 164/95 | HR 64 | Temp 98.4°F | Ht 61.0 in | Wt 164.4 lb

## 2021-10-16 DIAGNOSIS — K5904 Chronic idiopathic constipation: Secondary | ICD-10-CM

## 2021-10-16 NOTE — Progress Notes (Signed)
Cephas Darby, MD [address]  [place]  [place],  [address]  Main: [phone]  Fax: [phone]    Gastroenterology Consultation  Referring Provider:     [name], MD Primary Care Physician:  [name], MD Primary Gastroenterologist:  Dr. Cephas Darby Reason for Consultation:   Chronic idiopathic constipation       HPI:   [name] is a 55 y.o. female referred by Dr. [name], MD  for consultation & management of several months history of lower abdominal pain, abdominal bloating, irregular bowel habits.  Patient has poorly controlled diabetes with diabetic neuropathy, blurred vision, chronic fatigue, currently on metformin 1000 mg twice daily.  Patient has stopped working more than a year ago due to above symptoms, she used to be a caregiver.  Today, patient has been tearful and felt very low as she cannot get out of the house, she lives alone.  She says her whole body hurts and also she has biceps tear in her left hand, has appointment to see orthopedic.  She is on gabapentin 600 mg 3 times daily for neuropathic pain, and generalized pain.  She reports that she does not eat healthy, her blood sugars usually run around 300s.  Patient does not smoke or drink alcohol  Follow-up visit 10/16/2021 Patient is here for follow-up of chronic constipation.  Patient tried Linzess 290 MCG samples, which were helping.  Started on prescription.  Today, she reports that she has been having 3 watery bowel movements daily, in the morning, afternoon and before she goes to bed.  She reports that abdominal bloating is significantly improved along with lower abdominal cramps.  She is trying to follow a high-fiber diet, cut back on carbonated beverages.  She also lost few pounds.  Her hemoglobin A1c is also improved from 9.6 to 7.8.  Normal thyroid profile, no evidence of anemia.  LFTs normal.  Patient underwent colonoscopy which revealed 3 large polyps that were  resected and were benign, adenomatous with no evidence of dysplasia  NSAIDs: None  Antiplts/Anticoagulants/Anti thrombotics: None  GI Procedures:  EGD and colonoscopy 10/02/2021 - Normal duodenal bulb and second portion of the duodenum. - Erythematous mucosa in the gastric body and antrum. Biopsied. - Normal gastroesophageal junction and esophagus. - Esophagogastric landmarks identified.  - One 12 mm polyp in the cecum, removed with a hot snare. Resected and retrieved. Clips (MR conditional) were placed. - One 15 mm polyp in the transverse colon, removed with a hot snare. Resected and retrieved. Clip (MR conditional) was placed. - One 15 mm polyp in the descending colon, removed with a hot snare. Resected and retrieved. Clips (MR conditional) were placed. - Stool in the entire examined colon. - Non-bleeding external hemorrhoids. - Diverticulosis in the sigmoid colon, in the descending colon and in the ascending colon.  DIAGNOSIS:  A. STOMACH, RANDOM; COLD BIOPSY:  - GASTRIC ANTRAL MUCOSA WITH CHRONIC INACTIVE GASTRITIS AND INTESTINAL  METAPLASIA.  - NEGATIVE FOR DYSPLASIA AND MALIGNANCY.   Comment:  Biopsy sections display antral mucosa with glandular atrophy, chronic  inflammation, and intestinal metaplasia.  The features may potentially  be suggestive of atrophic gastritis.  An immunohistochemical study  directed against H. pylori is negative  B. COLON POLYP, CECUM; HOT SNARE:  - MULTIPLE FRAGMENTS OF TUBULAR ADENOMA.  - CAUTERIZED POLYP BASE APPEARS FREE OF DYSPLASIA.  - NEGATIVE FOR HIGH-GRADE DYSPLASIA AND MALIGNANCY.   C. COLON POLYP, TRANSVERSE; HOT SNARE:  - TUBULOVILLOUS ADENOMA.  -  CAUTERIZED POLYP BASE APPEARS FREE OF DYSPLASIA.  - NEGATIVE FOR HIGH-GRADE DYSPLASIA AND MALIGNANCY.   D. COLON POLYP, DESCENDING; HOT SNARE:  - TUBULOVILLOUS ADENOMA.  - CAUTERIZED POLYP BASE APPEARS FREE OF DYSPLASIA.  - NEGATIVE FOR HIGH-GRADE DYSPLASIA AND MALIGNANCY.  She  denies family history of GI malignancy  Past Medical History:  Diagnosis Date   Acute diverticulitis    Acute pain of right knee 10/14/2016   Acute renal failure (ARF) ([place]) 01/26/2019   Asthma    Depression    Diabetes mellitus without complication ([place])    Diverticulitis large intestine 05/12/2020   Hypertension     Past Surgical History:  Procedure Laterality Date   ABDOMINAL HYSTERECTOMY     COLONOSCOPY WITH PROPOFOL N/A 10/02/2021   Procedure: COLONOSCOPY WITH PROPOFOL;  Surgeon: Lin Landsman, MD;  Location: [facility] ENDOSCOPY;  Service: Gastroenterology;  Laterality: N/A;   ESOPHAGOGASTRODUODENOSCOPY N/A 10/02/2021   Procedure: ESOPHAGOGASTRODUODENOSCOPY (EGD);  Surgeon: Lin Landsman, MD;  Location: [facility] ENDOSCOPY;  Service: Gastroenterology;  Laterality: N/A;   TUBAL LIGATION      Current Outpatient Medications:    acetaminophen (TYLENOL) 325 MG tablet, Take 2 tablets (650 mg total) by mouth every 6 (six) hours as needed for mild pain or fever., Disp: 30 tablet, Rfl: 0   albuterol (PROVENTIL HFA;VENTOLIN HFA) 108 (90 Base) MCG/ACT inhaler, Inhale 1-2 puffs into the lungs every 6 (six) hours as needed for wheezing or shortness of breath., Disp: 1 Inhaler, Rfl: 0   albuterol (PROVENTIL) (2.5 MG/3ML) 0.083% nebulizer solution, Take 3 mLs (2.5 mg total) by nebulization every 2 (two) hours as needed for wheezing or shortness of breath., Disp: 75 mL, Rfl: 0   aspirin EC 81 MG tablet, Take 81 mg by mouth daily., Disp: , Rfl:    atorvastatin (LIPITOR) 10 MG tablet, Take 10 mg by mouth daily., Disp: , Rfl:    Cholecalciferol (VITAMIN D3) 1.25 MG (50000 UT) CAPS, Take 1 capsule by mouth once a week., Disp: , Rfl:    cyclobenzaprine (FLEXERIL) 5 MG tablet, TAKE 1 TABLET BY MOUTH NIGHTLY AS NEEDEDFOR MUSCLE SPASMS, Disp: , Rfl:    diclofenac sodium (VOLTAREN) 1 % GEL, Apply 2 g topically 4 (four) times daily as needed (pain). (Patient taking differently: Apply 2 g topically in the  morning, at noon, in the evening, and at bedtime.), Disp: 1 Tube, Rfl: 0   DULoxetine (CYMBALTA) 20 MG capsule, Take by mouth., Disp: , Rfl:    gabapentin (NEURONTIN) 600 MG tablet, Take by mouth., Disp: , Rfl:    glipiZIDE (GLUCOTROL) 5 MG tablet, Take 1 tablet (5 mg total) by mouth every morning., Disp: 30 tablet, Rfl: 0   hydrochlorothiazide (HYDRODIURIL) 25 MG tablet, Take 25 mg by mouth daily., Disp: , Rfl:    linaclotide (LINZESS) 290 MCG CAPS capsule, Take 1 capsule (290 mcg total) by mouth daily before breakfast., Disp: 90 capsule, Rfl: 2   loratadine (CLARITIN) 10 MG tablet, Take 1 tablet (10 mg total) by mouth daily., Disp: 30 tablet, Rfl: 0   losartan (COZAAR) 50 MG tablet, Take 1 tablet (50 mg total) by mouth daily., Disp: 30 tablet, Rfl: 1   metoprolol tartrate (LOPRESSOR) 50 MG tablet, Take by mouth., Disp: , Rfl:    omeprazole (PRILOSEC) 20 MG capsule, Take 2 capsules (40 mg total) by mouth daily. (Patient taking differently: Take 20 mg by mouth daily.), Disp: 60 capsule, Rfl: 0   polyethylene glycol (MIRALAX / GLYCOLAX) 17 g packet, Take 17  g by mouth 2 (two) times daily., Disp: 30 each, Rfl: 1   Semaglutide (RYBELSUS) 7 MG TABS, Take by mouth., Disp: , Rfl:    sertraline (ZOLOFT) 50 MG tablet, Take 1.5 tablets (75 mg total) by mouth every morning., Disp: 45 tablet, Rfl: 0   traMADol (ULTRAM) 50 MG tablet, Take by mouth every 6 (six) hours as needed., Disp: , Rfl:    Ubrogepant 100 MG TABS, Take 100 mg by mouth daily as needed., Disp: , Rfl:    metFORMIN (GLUCOPHAGE) 1000 MG tablet, Take 1 tablet (1,000 mg total) by mouth 2 (two) times daily with a meal. See discharge instructions for how to take medicine at first, Disp: 60 tablet, Rfl: 1    Family History  Problem Relation Age of Onset   Hypertension Mother      Social History   Tobacco Use   Smoking status: Former    Packs/day: 3.00    Years: 10.00    Pack years: 30.00    Types: Cigarettes    Quit date: 12/22/2004     Years since quitting: 16.8   Smokeless tobacco: Never  Vaping Use   Vaping Use: Never used  Substance Use Topics   Alcohol use: No   Drug use: No    Allergies as of 10/16/2021   (No Known Allergies)    Review of Systems:    All systems reviewed and negative except where noted in HPI.   Physical Exam:  BP (!) 164/95 (BP Location: Right Arm, Patient Position: Sitting, Cuff Size: Normal)   Pulse 64   Temp 98.4 F (36.9 C) (Oral)   Ht 5\' 1"  (1.549 m)   Wt 164 lb 6.4 oz (74.6 kg)   BMI 31.06 kg/m  No LMP recorded. Patient has had a hysterectomy.  General:   Alert,  Well-developed, well-nourished, pleasant and cooperative in NAD Head:  Normocephalic and atraumatic. Eyes:  Sclera clear, no icterus.   Conjunctiva pink, bilateral proptosis. Ears:  Normal auditory acuity. Nose:  No deformity, discharge, or lesions. Mouth:  No deformity or lesions,oropharynx pink & moist. Neck:  Supple; no masses or thyromegaly. Lungs:  Respirations even and unlabored.  Clear throughout to auscultation.   No wheezes, crackles, or rhonchi. No acute distress. Heart:  Regular rate and rhythm; no murmurs, clicks, rubs, or gallops. Abdomen:  Normal bowel sounds. Soft, nontender, nondistended, without masses, hepatosplenomegaly or hernias noted.  No guarding or rebound tenderness.   Rectal: Not performed Msk:  Symmetrical without gross deformities. Good, equal movement & strength bilaterally. Pulses:  Normal pulses noted. Extremities:  No clubbing or edema.  No cyanosis. Neurologic:  Alert and oriented x3;  grossly normal neurologically. Skin:  Intact without significant lesions or rashes. No jaundice. Psych:  Alert and cooperative. Normal mood and affect.  Imaging Studies: Reviewed  Assessment and Plan:   [name] is a 55 y.o. African-American female with obesity, metabolic syndrome, poorly controlled diabetes on metformin, diabetic neuropathy is seen in consultation for chronic lower  abdominal pain, abdominal bloating and irregular bowel habits.  Patient is also concerned about unintentional weight loss.  Her symptoms are likely worsened with gabapentin use and she may have diabetic autonomic neuropathy affecting her GI tract.    Chronic idiopathic constipation Linzess 290 MCG has led to diarrhea, although bloating and abdominal pain have improved.  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

## 2021-10-16 NOTE — Patient Instructions (Signed)
Low-Sodium Eating Plan Sodium, which is an element that makes up salt, helps you maintain a healthy balance of fluids in your body. Too much sodium can increase your blood pressure and cause fluid and waste to be held in your body. Your health care provider or dietitian may recommend following this plan if you have high blood pressure (hypertension), kidney disease, liver disease, or heart failure. Eating less sodium can help lower your blood pressure, reduce swelling, and protect your heart, liver, and kidneys. What are tips for following this plan? Reading food labels The Nutrition Facts label lists the amount of sodium in one serving of the food. If you eat more than one serving, you must multiply the listed amount of sodium by the number of servings. Choose foods with less than 140 mg of sodium per serving. Avoid foods with 300 mg of sodium or more per serving. Shopping  Look for lower-sodium products, often labeled as "low-sodium" or "no salt added." Always check the sodium content, even if foods are labeled as "unsalted" or "no salt added." Buy fresh foods. Avoid canned foods and pre-made or frozen meals. Avoid canned, cured, or processed meats. Buy breads that have less than 80 mg of sodium per slice. Cooking  Eat more home-cooked food and less restaurant, buffet, and fast food. Avoid adding salt when cooking. Use salt-free seasonings or herbs instead of table salt or sea salt. Check with your health care provider or pharmacist before using salt substitutes. Cook with plant-based oils, such as canola, sunflower, or olive oil. Meal planning When eating at a restaurant, ask that your food be prepared with less salt or no salt, if possible. Avoid dishes labeled as brined, pickled, cured, smoked, or made with soy sauce, miso, or teriyaki sauce. Avoid foods that contain MSG (monosodium glutamate). MSG is sometimes added to Chinese food, bouillon, and some canned foods. Make meals that can  be grilled, baked, poached, roasted, or steamed. These are generally made with less sodium. General information Most people on this plan should limit their sodium intake to 1,500-2,000 mg (milligrams) of sodium each day. What foods should I eat? Fruits Fresh, frozen, or canned fruit. Fruit juice. Vegetables Fresh or frozen vegetables. "No salt added" canned vegetables. "No salt added" tomato sauce and paste. Low-sodium or reduced-sodium tomato and vegetable juice. Grains Low-sodium cereals, including oats, puffed wheat and rice, and shredded wheat. Low-sodium crackers. Unsalted rice. Unsalted pasta. Low-sodium bread. Whole-grain breads and whole-grain pasta. Meats and other proteins Fresh or frozen (no salt added) meat, poultry, seafood, and fish. Low-sodium canned tuna and salmon. Unsalted nuts. Dried peas, beans, and lentils without added salt. Unsalted canned beans. Eggs. Unsalted nut butters. Dairy Milk. Soy milk. Cheese that is naturally low in sodium, such as ricotta cheese, fresh mozzarella, or Swiss cheese. Low-sodium or reduced-sodium cheese. Cream cheese. Yogurt. Seasonings and condiments Fresh and dried herbs and spices. Salt-free seasonings. Low-sodium mustard and ketchup. Sodium-free salad dressing. Sodium-free light mayonnaise. Fresh or refrigerated horseradish. Lemon juice. Vinegar. Other foods Homemade, reduced-sodium, or low-sodium soups. Unsalted popcorn and pretzels. Low-salt or salt-free chips. The items listed above may not be a complete list of foods and beverages you can eat. Contact a dietitian for more information. What foods should I avoid? Vegetables Sauerkraut, pickled vegetables, and relishes. Olives. French fries. Onion rings. Regular canned vegetables (not low-sodium or reduced-sodium). Regular canned tomato sauce and paste (not low-sodium or reduced-sodium). Regular tomato and vegetable juice (not low-sodium or reduced-sodium). Frozen vegetables in  sauces. Grains   Instant hot cereals. Bread stuffing, pancake, and biscuit mixes. Croutons. Seasoned rice or pasta mixes. Noodle soup cups. Boxed or frozen macaroni and cheese. Regular salted crackers. Self-rising flour. Meats and other proteins Meat or fish that is salted, canned, smoked, spiced, or pickled. Precooked or cured meat, such as sausages or meat loaves. Bacon. Ham. Pepperoni. Hot dogs. Corned beef. Chipped beef. Salt pork. Jerky. Pickled herring. Anchovies and sardines. Regular canned tuna. Salted nuts. Dairy Processed cheese and cheese spreads. Hard cheeses. Cheese curds. Blue cheese. Feta cheese. String cheese. Regular cottage cheese. Buttermilk. Canned milk. Fats and oils Salted butter. Regular margarine. Ghee. Bacon fat. Seasonings and condiments Onion salt, garlic salt, seasoned salt, table salt, and sea salt. Canned and packaged gravies. Worcestershire sauce. Tartar sauce. Barbecue sauce. Teriyaki sauce. Soy sauce, including reduced-sodium. Steak sauce. Fish sauce. Oyster sauce. Cocktail sauce. Horseradish that you find on the shelf. Regular ketchup and mustard. Meat flavorings and tenderizers. Bouillon cubes. Hot sauce. Pre-made or packaged marinades. Pre-made or packaged taco seasonings. Relishes. Regular salad dressings. Salsa. Other foods Salted popcorn and pretzels. Corn chips and puffs. Potato and tortilla chips. Canned or dried soups. Pizza. Frozen entrees and pot pies. The items listed above may not be a complete list of foods and beverages you should avoid. Contact a dietitian for more information. Summary Eating less sodium can help lower your blood pressure, reduce swelling, and protect your heart, liver, and kidneys. Most people on this plan should limit their sodium intake to 1,500-2,000 mg (milligrams) of sodium each day. Canned, boxed, and frozen foods are high in sodium. Restaurant foods, fast foods, and pizza are also very high in sodium. You also get sodium by  adding salt to food. Try to cook at home, eat more fresh fruits and vegetables, and eat less fast food and canned, processed, or prepared foods. This information is not intended to replace advice given to you by your health care provider. Make sure you discuss any questions you have with your health care provider. Document Revised: 01/13/2020 Document Reviewed: 11/09/2019 Elsevier Patient Education  2022 [employer]  

## 2021-10-17 ENCOUNTER — Encounter: Payer: Self-pay | Admitting: Occupational Therapy

## 2021-10-17 ENCOUNTER — Ambulatory Visit: Payer: 59 | Admitting: Occupational Therapy

## 2021-10-17 DIAGNOSIS — M79642 Pain in left hand: Secondary | ICD-10-CM

## 2021-10-17 DIAGNOSIS — M6281 Muscle weakness (generalized): Secondary | ICD-10-CM

## 2021-10-17 DIAGNOSIS — M25642 Stiffness of left hand, not elsewhere classified: Secondary | ICD-10-CM

## 2021-10-17 DIAGNOSIS — R278 Other lack of coordination: Secondary | ICD-10-CM

## 2021-10-17 NOTE — Therapy (Signed)
[facility] [address] [place], [place], [address] Phone: [phone]   Fax:  [phone]  Occupational Therapy Evaluation  Patient Details  Name: [name] MRN: [id number] Date of Birth: 12/20/1966 Referring Provider (OT): Dr Tempie Donning   Encounter Date: 10/17/2021   OT End of Session - 10/17/21 0942     Visit Number 1    Number of Visits 6    Date for OT Re-Evaluation 11/14/21   every 10 visits   Authorization Type [employer]    Authorization - Visit Number 1    Progress Note Due on Visit 10    OT Start Time [phone]    OT Stop Time 0929    OT Time Calculation (min) 47 min    Activity Tolerance Patient tolerated treatment well;No increased pain    Behavior During Therapy [facility] for tasks assessed/performed             Past Medical History:  Diagnosis Date   Acute diverticulitis    Acute pain of right knee 10/14/2016   Acute renal failure (ARF) ([place]) 01/26/2019   Asthma    Depression    Diabetes mellitus without complication ([place])    Diverticulitis large intestine 05/12/2020   Hypertension     Past Surgical History:  Procedure Laterality Date   ABDOMINAL HYSTERECTOMY     COLONOSCOPY WITH PROPOFOL N/A 10/02/2021   Procedure: COLONOSCOPY WITH PROPOFOL;  Surgeon: Lin Landsman, MD;  Location: [place];  Service: Gastroenterology;  Laterality: N/A;   ESOPHAGOGASTRODUODENOSCOPY N/A 10/02/2021   Procedure: ESOPHAGOGASTRODUODENOSCOPY (EGD);  Surgeon: Lin Landsman, MD;  Location[facility] ENDOSCOPY;  Service: Gastroenterology;  Laterality: N/A;   TUBAL LIGATION      There were no vitals filed for this visit.   Subjective Assessment - 10/17/21 0847     Subjective  Pt reports fall in her shower in 2019 and has had pain in her left hand since a dx of distal biceps rupture. Pt is able to flex/extend her elbow and perofrm pro/sup noted.    Pertinent History depression, HTN, DM, diabetic neuropathy in feet and  hands, asthma, marijuana use, GERD, lower abdominal pain, abdominal bloating, polyps adenomatous, colon polyps, diverticulitis, pain in knee.    Patient Stated Goals Get hand moving better    Currently in Pain? Yes    Pain Score 7     Pain Location Hand    Pain Orientation Left    Pain Descriptors / Indicators Tingling;Numbness    Pain Type Chronic pain   >1 year   Pain Radiating Towards Pt reports pain, numbness and tingling in middle of left volar hand    Pain Onset More than a month ago   Pain >1 year ago   Pain Frequency Intermittent    Aggravating Factors  Cold    Pain Relieving Factors medications (tramadol and gabapentin)    Multiple Pain Sites No               [facility] OT Assessment - 10/17/21 0001       Assessment   Medical Diagnosis Left hand pain    Referring Provider (OT) Dr Tempie Donning    Onset Date/Surgical Date --   >1 year ago   Hand Dominance Right    Next MD Visit Unknown      Precautions   Precautions None      Balance Screen   Has the patient fallen in the past 6 months Yes    How many times?  3    Has the patient had a decrease in activity level because of a fear of falling?  No    Is the patient reluctant to leave their home because of a fear of falling?  No      Home  Environment   Family/patient expects to be discharged to: Private residence    [place] With Other (Comment)   Alone, pt lives in a camper in her landlord's back yard     Prior Function   Level of Pass Christian Unemployed    Leisure Helping people, hopping, crossword puzzles      ADL   ADL comments Pt reports Mod I with increased time for tasks. She microwaves her meals      IADL   Shopping Takes care of all shopping needs independently    Light Housekeeping Does personal laundry completely    Meal Prep Plans, prepares and serves adequate meals independently   Pt reports tha tshe uses microwave meals at home   Medication Management  Is responsible for taking medication in correct dosages at correct time    Physiological scientist financial matters independently (budgets, writes checks, pays rent, bills goes to bank), collects and keeps track of income      Mobility   Mobility Status History of falls   3 falls in last 6 months per pt report   Mobility Status Comments Ambulates w/o AD      Written Expression   Dominant Hand Right      Vision - History   Baseline Vision Wears glasses all the time      Activity Tolerance   Activity Tolerance Endurance does not limit participation in activity      Cognition   Overall Cognitive Status Within Functional Limits for tasks assessed    Cognition Comments Pt holds left hand in somewhat of a flex position at the fingers, and rubs her volar palm repeatedly during session, starting at her hand. Movement is slow and deliberate and requires verbal encouragement but is not limited and sensibility assessment indicates sensation WFL's left hand.      Observation/Other Assessments   Observations Pt presents holding her left hand in slightly flexed position and arm extended. Min edema noted, pt stares at her hand and touches one finger at a time repeatedly.      Sensation   Light Touch Appears Intact    Semmes Weinstein Monofilament Scale Normal   Left digits 1-5, increased time responding but WNL's   Hot/Cold Impaired by gross assessment   Pt reports difficulty sensing hot/cold water at home when running water, she uses her right dominant hand to assess.   Additional Comments 2 point discrimination left hand = 5      Coordination   Gross Motor Movements are Fluid and Coordinated Yes    Fine Motor Movements are Fluid and Coordinated No   Pt with full composite fist, flat fist, opposition - slow deliberate movements take increased time.   Coordination Pt reports pain in middle of volar palm with A/ROM noted. All movement is slow and deliberate, taking increased time.      Edema    Edema Min edema noted left vs right hand      ROM / Strength   AROM / PROM / Strength AROM;Strength      AROM   Overall AROM  Within functional limits for tasks performed;Deficits   Left Hand: Pt moves slowly,  A/ROM is WNL's for digits, wrist and hand, but increased time required for all tasks noted     Strength   Overall Strength --   Grossly 3/5 bilaterally     Hand Function   Right Hand Grip (lbs) 53.4    Right Hand Lateral Pinch 11 lbs    Right Hand 3 Point Pinch 11 lbs    Left Hand Grip (lbs) 19.1    Left Hand Lateral Pinch 8 lbs    Left 3 point pinch 9.5 lbs    Comment Pt is w/o atrophy noted in thenar or hypothenar emminence. Palpation of forearm and elbow were unremarkable.   No deficits noted            Flexor Tendon Gliding (Active Full Fist)    Straighten all fingers, then make a fist, bending all joints. Repeat ____ times. Do ____ sessions per day.  Flexor Tendon Gliding (Active Hook Fist)    With fingers and knuckles straight, bend middle and tip joints. Do not bend large knuckles. Repeat ____ times. Do ____ sessions per day.  Flexor Tendon Gliding (Active Straight Fist)    Start with fingers straight. Bend knuckles and middle joints. Keep fingertip joints straight to touch base of palm. Repeat ____ times. Do ____ sessions per day.  FINGER: Extension    Rest hand on surface, palm down. Open fingers until hand is flat. ___ reps per set, ___ sets per day, ___ days per week   Finger Extension: Resisted    With right hand in fist, wrap rubber band around thumb and back of fingers. Keeping first row of knuckles bent, slowly straighten fingers. Repeat ____ times per set.  Do ____ sessions per day.   Grip Strengthening (Resistive Putty)    Squeeze putty using thumb and all fingers. Repeat ____ times. Do ____ sessions per day.  Three Jaw Chuck Pinch Strengthening (Resistive Putty)    Pull putty, using thumb, index and middle  fingers. Repeat ____ times. Do ____ sessions per day.   OT Education - 10/17/21 5009     Education Details A/ROM and gentle stretngthening left hand, putty ex's, tendon gliding. Functional use left hand    Person(s) Educated Patient    Methods Explanation;Demonstration;Handout    Comprehension Verbalized understanding;Need further instruction              OT Short Term Goals - 10/17/21 0955       OT SHORT TERM GOAL #1   Title Pt will be Mod I initial HEP    Time 3    Period Weeks    Status New    Target Date 11/07/21      OT SHORT TERM GOAL #2   Title Pt will be Mod I left hand functional use during ADL's and light functional tasks as non-dominant UE in clinic setting.    Time 3    Period Weeks    Status New    Target Date 11/07/21               OT Long Term Goals - 10/17/21 0956       OT LONG TERM GOAL #1   Title Pt will be Mod I upgraded HEP for L hand as seen in clinic    Time 6    Period Weeks    Status New    Target Date 11/28/21      OT LONG TERM GOAL #2   Title Pt will report decreased left hand  pain  as seen by rating of 5/10 or less    Time 6    Period Weeks    Status New    Target Date 11/28/21      OT LONG TERM GOAL #3   Title Pt will demonstrate improved grip strength as per JAMAR dynamometer assessment of 25# or greater.    Time 6    Period Weeks    Status New    Target Date 11/28/21      OT LONG TERM GOAL #4   Title Pt will be Mod I using left hand as assist during [facility] tasks in the clinic setting and will report independence with ADL's and homemaking tasks requiring tip to tip prehension (buttons, zippers, brushing teeth, bathing etc).    Time 6    Period Weeks    Status New    Target Date 11/28/21                   Plan - 10/17/21 0944     Clinical Impression Statement Pt is a 55 y/o right hand dominant female with a significant PMH to include: HTN, DM, neuropathy (feet and hands), knee pain, GERD, asthma, depression,  diverticulitis. Please see chart for complete PMH. Pt presents per Dr Tempie Donning secondary to left hand pain. L Sensibility assessment indicates sensation WFL's as per semmes weinstein monofilaments. 2 point discrimination = 5 left hand digits 1-5. Pt reports that her pain begain over 1 year ago following a distal biceps tear/rupture in 2019. She has A/ROM left shoulder, forearm, wrist and hand WFL's, however all hand motion is slow and deliberate and requires verbal encouragement to complete. She was w/o atrophy noted to her thenar and hypothenar emminence(s) and palpation of her volar forearm and elbow was unremarkable. She has defictis in overall strength and fluid A/ROM as well as c/o pain in her left hand. She should benefit from continued out-pt OT for HEP instruction, A/ROM and strengthening of her LUE. She was issued a preliminary HEP today and will return in 2 weeks for f/u and progression of her HEP.    OT Occupational Profile and History Problem Focused Assessment - Including review of records relating to presenting problem    Occupational performance deficits (Please refer to evaluation for details): ADL's    Body Structure / Function / Physical Skills ADL;Strength;Dexterity;Pain;UE functional use;ROM;Sensation;Coordination;Flexibility;FMC    Rehab Potential Fair    Clinical Decision Making Limited treatment options, no task modification necessary    Comorbidities Affecting Occupational Performance: May have comorbidities impacting occupational performance   PMH and previous dx of DM/neuropathy may be contributing factor   Modification or Assistance to Complete Evaluation  No modification of tasks or assist necessary to complete eval    OT Frequency 1x / week    OT Duration 6 weeks   Eval + up to 6 additional visits   OT Treatment/Interventions Self-care/ADL training;Fluidtherapy;DME and/or AE instruction;Therapeutic activities;Therapeutic exercise;Passive range of motion;Manual  Therapy;Patient/family education    Plan Review performance and upgrade of HEP left UE. ?UBE and upgrade putty resistance as able over next 1-2 visits.    Consulted and Agree with Plan of Care Patient             Patient will benefit from skilled therapeutic intervention in order to improve the following deficits and impairments:   Body Structure / Function / Physical Skills: ADL, Strength, Dexterity, Pain, UE functional use, ROM, Sensation, Coordination, Flexibility, [facility]       Visit Diagnosis: Pain  in left hand - Plan: Ot plan of care cert/re-cert  Stiffness of left hand, not elsewhere classified - Plan: Ot plan of care cert/re-cert  Muscle weakness (generalized) - Plan: Ot plan of care cert/re-cert  Other lack of coordination - Plan: Ot plan of care cert/re-cert    Problem List Patient Active Problem List   Diagnosis Date Noted   Pain in left hand 10/10/2021   Abdominal bloating    Loss of weight    Lower abdominal pain    Polyp of colon    Multiple adenomatous polyps    Depression    HLD (hyperlipidemia)    GERD (gastroesophageal reflux disease)    Influenza A 01/26/2019   Marijuana use 06/12/2017   Diabetic neuropathy ([place]) 01/07/2016   Plantar fasciitis of left foot 11/26/2015   Back pain 10/10/2015   Asthma 09/29/2015   Essential hypertension 09/29/2015   Diabetes mellitus without complication ([place]) 45/80/9983    Saadiya Wilfong Ardath Sax, OT/L 10/17/2021, 10:07 AM  [facility] [address] [place], [place], [address] Phone: [phone]   Fax:  [phone]  Name: SUZI HERNAN MRN: [id number] Date of Birth: February 01, 1966

## 2021-10-17 NOTE — Patient Instructions (Signed)
Flexor Tendon Gliding (Active Full Fist)    Straighten all fingers, then make a fist, bending all joints. Repeat ____ times. Do ____ sessions per day.  Flexor Tendon Gliding (Active Hook Fist)    With fingers and knuckles straight, bend middle and tip joints. Do not bend large knuckles. Repeat ____ times. Do ____ sessions per day.  Flexor Tendon Gliding (Active Straight Fist)    Start with fingers straight. Bend knuckles and middle joints. Keep fingertip joints straight to touch base of palm. Repeat ____ times. Do ____ sessions per day.  FINGER: Extension    Rest hand on surface, palm down. Open fingers until hand is flat. ___ reps per set, ___ sets per day, ___ days per week   Finger Extension: Resisted    With right hand in fist, wrap rubber band around thumb and back of fingers. Keeping first row of knuckles bent, slowly straighten fingers. Repeat ____ times per set.  Do ____ sessions per day.   Grip Strengthening (Resistive Putty)    Squeeze putty using thumb and all fingers. Repeat ____ times. Do ____ sessions per day.  Three Jaw Chuck Pinch Strengthening (Resistive Putty)    Pull putty, using thumb, index and middle fingers. Repeat ____ times. Do ____ sessions per day.  Copyright  VHI. All rights reserved.

## 2021-10-24 ENCOUNTER — Encounter: Payer: Self-pay | Admitting: Occupational Therapy

## 2021-10-24 ENCOUNTER — Ambulatory Visit (INDEPENDENT_AMBULATORY_CARE_PROVIDER_SITE_OTHER): Payer: 59 | Admitting: Occupational Therapy

## 2021-10-24 ENCOUNTER — Other Ambulatory Visit: Payer: Self-pay

## 2021-10-24 DIAGNOSIS — M6281 Muscle weakness (generalized): Secondary | ICD-10-CM | POA: Diagnosis not present

## 2021-10-24 DIAGNOSIS — R278 Other lack of coordination: Secondary | ICD-10-CM

## 2021-10-24 DIAGNOSIS — M25512 Pain in left shoulder: Secondary | ICD-10-CM

## 2021-10-24 DIAGNOSIS — M25642 Stiffness of left hand, not elsewhere classified: Secondary | ICD-10-CM | POA: Diagnosis not present

## 2021-10-24 DIAGNOSIS — M79642 Pain in left hand: Secondary | ICD-10-CM | POA: Diagnosis not present

## 2021-10-24 NOTE — Patient Instructions (Signed)
CARPAL TUNNEL (Nerve Compression Syndrome): Wrist Stretch    Extend right arm with fingers facing down. With left hand, gently pull fingers of right hand toward body. Hold position for _5__ breaths. Repeat with arms switched. Repeat _5-10_ times, alternating arms. Do _2-3_ times per day.  Wrist Flexor Stretch    Keeping elbow straight, grasp left hand and slowly bend wrist back until stretch is felt. Hold __5__ seconds. Relax. Repeat _5-10__ times per set. Do _2-3__ sessions per day.   Avoid repetitive grip/grasping with your left hand.   You can massage your hand/palm at home

## 2021-10-24 NOTE — Therapy (Signed)
[facility] [address] [place], [place], [address] Phone: [phone]   Fax:  [phone]  Occupational Therapy Treatment  Patient Details  Name: [name] MRN: [id number] Date of Birth: Sep 17, 1966 Referring Provider (OT): Dr Tempie Donning   Encounter Date: 10/24/2021   OT End of Session - 10/24/21 1238     Visit Number 2    Number of Visits 6    Date for OT Re-Evaluation 11/14/21   Every 10 visits   Authorization Type [employer]    Authorization - Visit Number 2    Progress Note Due on Visit 10    OT Start Time 1137    OT Stop Time 1225    OT Time Calculation (min) 48 min    Activity Tolerance Patient tolerated treatment well;No increased pain    Behavior During Therapy [facility] for tasks assessed/performed             Past Medical History:  Diagnosis Date   Acute diverticulitis    Acute pain of right knee 10/14/2016   Acute renal failure (ARF) ([place]) 01/26/2019   Asthma    Depression    Diabetes mellitus without complication ([place])    Diverticulitis large intestine 05/12/2020   Hypertension     Past Surgical History:  Procedure Laterality Date   ABDOMINAL HYSTERECTOMY     COLONOSCOPY WITH PROPOFOL N/A 10/02/2021   Procedure: COLONOSCOPY WITH PROPOFOL;  Surgeon: Lin Landsman, MD;  Location: [place];  Service: Gastroenterology;  Laterality: N/A;   ESOPHAGOGASTRODUODENOSCOPY N/A 10/02/2021   Procedure: ESOPHAGOGASTRODUODENOSCOPY (EGD);  Surgeon: Lin Landsman, MD;  Location: [facility] ENDOSCOPY;  Service: Gastroenterology;  Laterality: N/A;   TUBAL LIGATION      There were no vitals filed for this visit.   Subjective Assessment - 10/24/21 1138     Subjective  Pt states "It's better" Pt performed hook and composite fist with left hand w/o difficulty noted. Pain is rated 6/10 in left palm and DIP's.    Pertinent History depression, HTN, DM, diabetic neuropathy in feet and hands, asthma, marijuana use, GERD,  lower abdominal pain, abdominal bloating, polyps adenomatous, colon polyps, diverticulitis, pain in knee.    Patient Stated Goals Get hand moving better    Currently in Pain? Yes    Pain Score 6     Pain Orientation Left    Pain Descriptors / Indicators Tingling;Numbness    Pain Type Chronic pain    Pain Onset More than a month ago    Pain Frequency Intermittent    Multiple Pain Sites No                OT Treatments/Exercises (OP) - 10/24/21 0001       ADLs   ADL Comments Pt reports mod I ADL's. States that her hand "Is better".      Exercises   Exercises Hand;Theraputty;Shoulder;Elbow      Shoulder Exercises: ROM/Strengthening   Other ROM/Strengthening Exercises Sitting at UBE for bilateral UE reciprocal A/ROM level 1 x6 min rotating forward and back.      Additional Elbow Exercises   UBE (Upper Arm Bike) See above      Hand Exercises   Other Hand Exercises Pt performed large grip pegs in pegboard and graded clothes pins.She was also educated in HEP for forearm carpal tunnel stretching with forearm supinated and elbow extended with passive wrist extension and then forearm pronated, elbow extended and passive wrist extension L UE.    Other Hand Exercises  Pt was mod I tendon gliding in the clinic today, Mod I HEP for gentle grip and pinches with red theraputty. Pt was cautioned to only perform putty 1x/day as instructed per her report of "doing htis all the time, all day".      Manual Therapy   Manual Therapy Soft tissue mobilization;Other (comment)   Left volar palm and digits as well as retrograde massage x10 min               OT Education - 10/24/21 1235     Education Details Review and performance of HEP with addition of forearm stretching and reiterate only performing putty ex's 1x/day per pt report of doing these "all day".    Person(s) Educated Patient    Methods Explanation;Demonstration;Handout    Comprehension Verbalized understanding;Need further  instruction              OT Short Term Goals - 10/24/21 1242       OT SHORT TERM GOAL #1   Title Pt will be Mod I initial HEP    Time 3    Period Weeks    Status Achieved    Target Date 11/07/21      OT SHORT TERM GOAL #2   Title Pt will be Mod I left hand functional use during ADL's and light functional tasks as non-dominant UE in clinic setting.    Time 3    Period Weeks    Status On-going    Target Date 11/07/21               OT Long Term Goals - 10/17/21 0956       OT LONG TERM GOAL #1   Title Pt will be Mod I upgraded HEP for L hand as seen in clinic    Time 6    Period Weeks    Status New    Target Date 11/28/21      OT LONG TERM GOAL #2   Title Pt will report decreased left hand  pain as seen by rating of 5/10 or less    Time 6    Period Weeks    Status New    Target Date 11/28/21      OT LONG TERM GOAL #3   Title Pt will demonstrate improved grip strength as per JAMAR dynamometer assessment of 25# or greater.    Time 6    Period Weeks    Status New    Target Date 11/28/21      OT LONG TERM GOAL #4   Title Pt will be Mod I using left hand as assist during [facility] tasks in the clinic setting and will report independence with ADL's and homemaking tasks requiring tip to tip prehension (buttons, zippers, brushing teeth, bathing etc).    Time 6    Period Weeks    Status New    Target Date 11/28/21                   Plan - 10/24/21 1238     Clinical Impression Statement Pt presents today with reports of decreased pain. She was noted to be Mod I with HEP L UE. Pt is able to perform all tendon gliding ex's w/o difficulty as noted during assessment. Upgraded HEP and stressed only performing putty ex's 1x/day as pt reports doing ex's multiple times per day. Excellent progress towards POC and goals noted.    OT Occupational Profile and History Problem Focused Assessment - Including review  of records relating to presenting problem    Occupational  performance deficits (Please refer to evaluation for details): ADL's    Body Structure / Function / Physical Skills ADL;Strength;Dexterity;Pain;UE functional use;ROM;Sensation;Coordination;Flexibility;FMC    Rehab Potential Fair    Clinical Decision Making Limited treatment options, no task modification necessary    Comorbidities Affecting Occupational Performance: May have comorbidities impacting occupational performance    Modification or Assistance to Complete Evaluation  No modification of tasks or assist necessary to complete eval    OT Frequency 1x / week    OT Duration 6 weeks   Eval + up to 6 additional visits   OT Treatment/Interventions Self-care/ADL training;Fluidtherapy;DME and/or AE instruction;Therapeutic activities;Therapeutic exercise;Passive range of motion;Manual Therapy;Patient/family education    Plan Review performance and upgrade of HEP left UE. UBE, light functional activity L UE.    Consulted and Agree with Plan of Care Patient             Patient will benefit from skilled therapeutic intervention in order to improve the following deficits and impairments:   Body Structure / Function / Physical Skills: ADL, Strength, Dexterity, Pain, UE functional use, ROM, Sensation, Coordination, Flexibility, [facility]       Visit Diagnosis: Pain in left hand  Stiffness of left hand, not elsewhere classified  Muscle weakness (generalized)  Other lack of coordination  Left shoulder pain, unspecified chronicity    Problem List Patient Active Problem List   Diagnosis Date Noted   Pain in left hand 10/10/2021   Abdominal bloating    Loss of weight    Lower abdominal pain    Polyp of colon    Multiple adenomatous polyps    Depression    HLD (hyperlipidemia)    GERD (gastroesophageal reflux disease)    Influenza A 01/26/2019   Marijuana use 06/12/2017   Diabetic neuropathy ([place]) 01/07/2016   Plantar fasciitis of left foot 11/26/2015   Back pain 10/10/2015   Asthma  09/29/2015   Essential hypertension 09/29/2015   Diabetes mellitus without complication ([place]) 32/11/2481    December Hedtke Ardath Sax, OT/L 10/24/2021, 12:45 PM  [facility] [address] [place], [place], [address] Phone: [phone]   Fax:  [phone]  Name: ELY SPRAGG MRN: [id number] Date of Birth: [birth year]  CARPAL TUNNEL (Nerve Compression Syndrome): Wrist Stretch    Extend right arm with fingers facing down. With left hand, gently pull fingers of right hand toward body. Hold position for _5__ breaths. Repeat with arms switched. Repeat _5-10_ times, alternating arms. Do _2-3_ times per day.  Wrist Flexor Stretch    Keeping elbow straight, grasp left hand and slowly bend wrist back until stretch is felt. Hold __5__ seconds. Relax. Repeat _5-10__ times per set. Do _2-3__ sessions per day.    Avoid repetitive grip/grasping/overuse with your left hand.    You can massage your hand/palm at home

## 2021-10-29 NOTE — Progress Notes (Signed)
10/30/2021 6:05 PM   [name] [birth year] [id number]  Referring provider: [name], MD [address] [place],  [name]  Chief Complaint  Patient presents with   Over Active Bladder   Urological history: 1.  Nephrolithiasis -contrast CT 2021 nonobstructing 3 mm stone in the upper pole of the left kidney. 1 mm nonobstructing stone in the lower pole the left kidney  2. Left adrenal mass -Stable since 2016, unlikely malignancy although not typical in characteristics of  adenoma   HPI: [name] is a 55 y.o. female who presents today for a one month follow up after a trial of Myrbetriq 25 mg for urgency.  UA negative  PVR 0 mL   She is experiencing 8 or more daytime urinations, 3 or more overnight urinations at a strong urge to urinate.  She is leaking urine when she laughs coughs sneezes etc.  She is having 3 or more episodes of urinary leakage daily.  She wears an absorbent pad daily.  And does engage in toilet mapping.  Her main complaint is leaking when she coughs, laughs or sneezes.    Patient denies any modifying or aggravating factors.  Patient denies any gross hematuria, dysuria or suprapubic/flank pain.  Patient denies any fevers, chills, nausea or vomiting.       PMH: Past Medical History:  Diagnosis Date   Acute diverticulitis    Acute pain of right knee 10/14/2016   Acute renal failure (ARF) ([place]) 01/26/2019   Asthma    Depression    Diabetes mellitus without complication ([place])    Diverticulitis large intestine 05/12/2020   Hypertension     Surgical History: Past Surgical History:  Procedure Laterality Date   ABDOMINAL HYSTERECTOMY     COLONOSCOPY WITH PROPOFOL N/A 10/02/2021   Procedure: COLONOSCOPY WITH PROPOFOL;  Surgeon: Crystal Landsman, MD;  Location: [facility] ENDOSCOPY;  Service: Gastroenterology;  Laterality: N/A;   ESOPHAGOGASTRODUODENOSCOPY N/A 10/02/2021   Procedure: ESOPHAGOGASTRODUODENOSCOPY (EGD);  Surgeon: Crystal Landsman, MD;  Location: [facility] ENDOSCOPY;  Service: Gastroenterology;  Laterality: N/A;   TUBAL LIGATION      Home Medications:  Allergies as of 10/30/2021   No Known Allergies      Medication List        Accurate as of October 30, 2021 11:59 PM. If you have any questions, ask your nurse or doctor.          acetaminophen 325 MG tablet Commonly known as: TYLENOL Take 2 tablets (650 mg total) by mouth every 6 (six) hours as needed for mild pain or fever.   albuterol 108 (90 Base) MCG/ACT inhaler Commonly known as: VENTOLIN HFA Inhale 1-2 puffs into the lungs every 6 (six) hours as needed for wheezing or shortness of breath.   albuterol (2.5 MG/3ML) 0.083% nebulizer solution Commonly known as: PROVENTIL Take 3 mLs (2.5 mg total) by nebulization every 2 (two) hours as needed for wheezing or shortness of breath.   aspirin EC 81 MG tablet Take 81 mg by mouth daily.   atorvastatin 10 MG tablet Commonly known as: LIPITOR Take 10 mg by mouth daily.   cyclobenzaprine 5 MG tablet Commonly known as: FLEXERIL TAKE 1 TABLET BY MOUTH NIGHTLY AS NEEDEDFOR MUSCLE SPASMS   diclofenac sodium 1 % Gel Commonly known as: VOLTAREN Apply 2 g topically 4 (four) times daily as needed (pain). What changed: when to take this   DULoxetine 20 MG capsule Commonly known as: CYMBALTA Take by mouth.  gabapentin 600 MG tablet Commonly known as: NEURONTIN Take by mouth.   glipiZIDE 5 MG tablet Commonly known as: Glucotrol Take 1 tablet (5 mg total) by mouth every morning.   hydrochlorothiazide 25 MG tablet Commonly known as: HYDRODIURIL Take 25 mg by mouth daily.   linaclotide 290 MCG Caps capsule Commonly known as: LINZESS Take 1 capsule (290 mcg total) by mouth daily before breakfast.   loratadine 10 MG tablet Commonly known as: CLARITIN Take 1 tablet (10 mg total) by mouth daily.   losartan 50 MG tablet Commonly known as: COZAAR Take 1 tablet (50 mg total) by mouth  daily.   metFORMIN 1000 MG tablet Commonly known as: Glucophage Take 1 tablet (1,000 mg total) by mouth 2 (two) times daily with a meal. See discharge instructions for how to take medicine at first   metoprolol tartrate 50 MG tablet Commonly known as: LOPRESSOR Take by mouth.   omeprazole 20 MG capsule Commonly known as: PRILOSEC Take 2 capsules (40 mg total) by mouth daily. What changed: how much to take   polyethylene glycol 17 g packet Commonly known as: MIRALAX / GLYCOLAX Take 17 g by mouth 2 (two) times daily.   Rybelsus 7 MG Tabs Generic drug: Semaglutide Take by mouth.   sertraline 50 MG tablet Commonly known as: ZOLOFT Take 1.5 tablets (75 mg total) by mouth every morning.   traMADol 50 MG tablet Commonly known as: ULTRAM Take by mouth every 6 (six) hours as needed.   Ubrogepant 100 MG Tabs Take 100 mg by mouth daily as needed.   Vitamin D3 1.25 MG (50000 UT) Caps Take 1 capsule by mouth once a week.        Allergies: No Known Allergies  Family History: Family History  Problem Relation Age of Onset   Hypertension Mother     Social History:  reports that she quit smoking about 16 years ago. Her smoking use included cigarettes. She has a 30.00 pack-year smoking history. She has never used smokeless tobacco. She reports that she does not drink alcohol and does not use drugs.  ROS: Pertinent ROS in HPI  Physical Exam: BP (!) 144/106   Pulse 81   Ht 5\' 1"  (1.549 m)   Wt 163 lb (73.9 kg)   BMI 30.80 kg/m   Constitutional:  Well nourished. Alert and oriented, No acute distress. HEENT: [place] AT, mask in place.  Trachea midline Cardiovascular: No clubbing, cyanosis, or edema. Respiratory: Normal respiratory effort, no increased work of breathing. GU: No CVA tenderness.  No bladder fullness or masses.  Normal external genitalia, sparse pubic hair distribution, no lesions.  Normal urethral meatus, no lesions, no prolapse, no discharge.   No urethral masses,  tenderness and/or tenderness. No bladder fullness, tenderness or masses. Normal vagina mucosa, fair estrogen effect, no discharge, no lesions, fair pelvic support, grade II cystocele and grade I rectocele noted.  Anus and perineum are without rashes or lesions.     Neurologic: Grossly intact, no focal deficits, moving all 4 extremities. Psychiatric: Normal mood and affect.    Laboratory Data: Lab Results  Component Value Date   WBC 5.9 09/17/2021   HGB 14.7 09/17/2021   HCT 44.4 09/17/2021   MCV 90 09/17/2021   PLT 251 09/17/2021    Lab Results  Component Value Date   CREATININE 1.01 (H) 09/17/2021    Lab Results  Component Value Date   HGBA1C 7.8 (H) 09/17/2021    Lab Results  Component Value Date  TSH 1.060 09/17/2021    Lab Results  Component Value Date   AST 20 09/17/2021   Lab Results  Component Value Date   ALT 25 09/17/2021   I have reviewed the labs.   Pertinent Imaging: Results for JOANANN, MIES (MRN [id number]) as of 10/30/2021 10:58  Ref. Range 10/30/2021 10:36  Scan Result Unknown 7mL    Assessment & Plan:    1. Urgency -minimal bother to patient  2. SUI -patient is experiencing bothersome symptoms of stress incontinence -discussed the cause of stress urinary incontinence and options available to her to address her symptoms (PT, pessary and incontinence surgery) -she would like to try a pessary at this time as this would be a less invasive treatment for her SUI -refer to gynecology   3. Cystocele -see #2  Return for gynecology for pessary fitting .  These notes generated with voice recognition software. I apologize for typographical errors.  Zara Council, PA-C  [facility] [address]  [place], [place] [address] [phone]

## 2021-10-30 ENCOUNTER — Other Ambulatory Visit: Payer: Self-pay

## 2021-10-30 ENCOUNTER — Ambulatory Visit (INDEPENDENT_AMBULATORY_CARE_PROVIDER_SITE_OTHER): Payer: 59 | Admitting: Urology

## 2021-10-30 ENCOUNTER — Encounter: Payer: Self-pay | Admitting: Urology

## 2021-10-30 ENCOUNTER — Telehealth: Payer: Self-pay

## 2021-10-30 VITALS — BP 144/106 | HR 81 | Ht 61.0 in | Wt 163.0 lb

## 2021-10-30 DIAGNOSIS — N8111 Cystocele, midline: Secondary | ICD-10-CM | POA: Diagnosis not present

## 2021-10-30 DIAGNOSIS — N393 Stress incontinence (female) (male): Secondary | ICD-10-CM | POA: Diagnosis not present

## 2021-10-30 DIAGNOSIS — R3915 Urgency of urination: Secondary | ICD-10-CM

## 2021-10-30 DIAGNOSIS — K581 Irritable bowel syndrome with constipation: Secondary | ICD-10-CM

## 2021-10-30 LAB — BLADDER SCAN AMB NON-IMAGING

## 2021-10-30 MED ORDER — LINACLOTIDE 290 MCG PO CAPS: 290.0000 ug | ORAL_CAPSULE | Freq: Every day | ORAL | 2 refills | Status: DC

## 2021-10-30 NOTE — Progress Notes (Signed)
Sent in refill as requested 

## 2021-10-30 NOTE — Telephone Encounter (Signed)
BUA referring for Evaluate for pessary for SUI and cystocele. Called and left voicemail for patient to call back to be scheduled.

## 2021-10-31 ENCOUNTER — Ambulatory Visit (INDEPENDENT_AMBULATORY_CARE_PROVIDER_SITE_OTHER): Payer: 59 | Admitting: Orthopedic Surgery

## 2021-10-31 ENCOUNTER — Encounter: Payer: Self-pay | Admitting: Occupational Therapy

## 2021-10-31 ENCOUNTER — Ambulatory Visit (INDEPENDENT_AMBULATORY_CARE_PROVIDER_SITE_OTHER): Payer: 59 | Admitting: Occupational Therapy

## 2021-10-31 ENCOUNTER — Encounter: Payer: Self-pay | Admitting: Orthopedic Surgery

## 2021-10-31 VITALS — BP 131/83 | HR 84 | Ht 61.0 in | Wt 163.0 lb

## 2021-10-31 DIAGNOSIS — R278 Other lack of coordination: Secondary | ICD-10-CM | POA: Diagnosis not present

## 2021-10-31 DIAGNOSIS — M6281 Muscle weakness (generalized): Secondary | ICD-10-CM | POA: Diagnosis not present

## 2021-10-31 DIAGNOSIS — E1149 Type 2 diabetes mellitus with other diabetic neurological complication: Secondary | ICD-10-CM

## 2021-10-31 DIAGNOSIS — M25642 Stiffness of left hand, not elsewhere classified: Secondary | ICD-10-CM

## 2021-10-31 DIAGNOSIS — M79642 Pain in left hand: Secondary | ICD-10-CM

## 2021-10-31 DIAGNOSIS — M25512 Pain in left shoulder: Secondary | ICD-10-CM

## 2021-10-31 NOTE — Telephone Encounter (Signed)
Patient is scheduled for 11/21/21 CRS

## 2021-10-31 NOTE — Therapy (Signed)
[facility] [address] [place], [place], [address] Phone: [phone]   Fax:  [phone]  Occupational Therapy Treatment  Patient Details  Name: [name] MRN: [id number] Date of Birth: [birth year] Referring Provider (OT): Dr Tempie Donning   Encounter Date: 10/31/2021   OT End of Session - 10/31/21 1229     Visit Number 3    Number of Visits 6    Date for OT Re-Evaluation 11/14/21   every 10 visits   Authorization Type [employer]    Authorization - Visit Number 3    Progress Note Due on Visit 10    OT Start Time 1146    OT Stop Time 1226    OT Time Calculation (min) 40 min             Past Medical History:  Diagnosis Date   Acute diverticulitis    Acute pain of right knee 10/14/2016   Acute renal failure (ARF) ([place]) 01/26/2019   Asthma    Depression    Diabetes mellitus without complication ([place])    Diverticulitis large intestine 05/12/2020   Hypertension     Past Surgical History:  Procedure Laterality Date   ABDOMINAL HYSTERECTOMY     COLONOSCOPY WITH PROPOFOL N/A 10/02/2021   Procedure: COLONOSCOPY WITH PROPOFOL;  Surgeon: Lin Landsman, MD;  Location: [facility] ENDOSCOPY;  Service: Gastroenterology;  Laterality: N/A;   ESOPHAGOGASTRODUODENOSCOPY N/A 10/02/2021   Procedure: ESOPHAGOGASTRODUODENOSCOPY (EGD);  Surgeon: Lin Landsman, MD;  [facility] END[facility]COPY;  Service: Gastroenterology;  Laterality: N/A;   TUBAL LIGATION      There were no vitals filed for this visit.   Subjective Assessment - 10/31/21 1151     Subjective  Pt states that her left hand "is better than it was"    Pertinent History depression, HTN, DM, diabetic neuropathy in feet and hands, asthma, marijuana use, GERD, lower abdominal pain, abdominal bloating, polyps adenomatous, colon polyps, diverticulitis, pain in knee.    Patient Stated Goals Get hand moving better    Currently in Pain? Yes    Pain Score 4     Pain Location Hand     Pain Orientation Left    Pain Descriptors / Indicators Aching;Discomfort    Pain Type Chronic pain    Pain Onset More than a month ago    Pain Frequency Intermittent    Aggravating Factors  cold    Pain Relieving Factors medications and voltaren gel    Multiple Pain Sites No                OT Treatments/Exercises (OP) - 10/31/21 0001       ADLs   ADL Comments Pt reports Mod I ADL's and functional activity related to homemaking, bathing and dressing. "It is getting better"      Exercises   Exercises Hand;Theraputty;Shoulder;Elbow      Additional Elbow Exercises   UBE (Upper Arm Bike) UBE at 8 min rotating forward and back bilateral UE for recriprocal arm movement    Theraputty - Grip Verbal reivew of HEP for putty and grip only 1x/day x10 reps and stressed not over doing this      Hand Exercises   Other Hand Exercises Pt performed graded clothes pins (yellow and red x2).She was also performed HEP for forearm/carpal tunnel stretching with forearm supinated and elbow extended, passive wrist extension and then forearm pronated, elbow extended and passive wrist extension L UE.    Other Hand Exercises Pt was mod  I tendon gliding in the clinic today, Mod I HEP for gentle grip and pinches with red theraputty. Pt was cautioned to only perform putty 1x/day as instructed.                 OT Education - 10/31/21 1229     Education Details Review and performance of HEP with addition of forearm and median nerve stretching and reiterate only performing putty ex's 1x/day    Person(s) Educated Patient    Methods Explanation;Demonstration;Handout    Comprehension Verbalized understanding;Need further instruction              OT Short Term Goals - 10/24/21 1242       OT SHORT TERM GOAL #1   Title Pt will be Mod I initial HEP    Time 3    Period Weeks    Status Achieved    Target Date 11/07/21      OT SHORT TERM GOAL #2   Title Pt will be Mod I left hand functional  use during ADL's and light functional tasks as non-dominant UE in clinic setting.    Time 3    Period Weeks    Status On-going    Target Date 11/07/21               OT Long Term Goals - 10/17/21 0956       OT LONG TERM GOAL #1   Title Pt will be Mod I upgraded HEP for L hand as seen in clinic    Time 6    Period Weeks    Status New    Target Date 11/28/21      OT LONG TERM GOAL #2   Title Pt will report decreased left hand  pain as seen by rating of 5/10 or less    Time 6    Period Weeks    Status New    Target Date 11/28/21      OT LONG TERM GOAL #3   Title Pt will demonstrate improved grip strength as per JAMAR dynamometer assessment of 25# or greater.    Time 6    Period Weeks    Status New    Target Date 11/28/21      OT LONG TERM GOAL #4   Title Pt will be Mod I using left hand as assist during [facility] tasks in the clinic setting and will report independence with ADL's and homemaking tasks requiring tip to tip prehension (buttons, zippers, brushing teeth, bathing etc).    Time 6    Period Weeks    Status New    Target Date 11/28/21               Plan - 10/31/21 1230     Clinical Impression Statement Pt continues to report decreased pain in left hand. Verbal cues for proper positioning during median nerve gliding ex's and extending elbow. Pt has A/ROM within normal limits. C/o mild pain in volar palm that she reports decreases with massage and retrograde massage today in therapy. Pt ed to avoid repetitive grip and finger flexion.    OT Occupational Profile and History Problem Focused Assessment - Including review of records relating to presenting problem    Occupational performance deficits (Please refer to evaluation for details): ADL's    Body Structure / Function / Physical Skills ADL;Strength;Dexterity;Pain;UE functional use;ROM;Sensation;Coordination;Flexibility;FMC    Rehab Potential Fair    Clinical Decision Making Limited treatment options, no task  modification necessary  Comorbidities Affecting Occupational Performance: May have comorbidities impacting occupational performance    Modification or Assistance to Complete Evaluation  No modification of tasks or assist necessary to complete eval    OT Frequency 1x / week    OT Duration 6 weeks   up to 6 additional visits   OT Treatment/Interventions Self-care/ADL training;Fluidtherapy;DME and/or AE instruction;Therapeutic activities;Therapeutic exercise;Passive range of motion;Manual Therapy;Patient/family education    Plan Review & upgrade HEP left UE. UBE with increased resistance, light functional activity L UE. Check STG's    Consulted and Agree with Plan of Care Patient             Patient will benefit from skilled therapeutic intervention in order to improve the following deficits and impairments:   Body Structure / Function / Physical Skills: ADL, Strength, Dexterity, Pain, UE functional use, ROM, Sensation, Coordination, Flexibility, [facility]       Visit Diagnosis: Pain in left hand  Stiffness of left hand, not elsewhere classified  Muscle weakness (generalized)  Other lack of coordination  Left shoulder pain, unspecified chronicity    Problem List Patient Active Problem List   Diagnosis Date Noted   Pain in left hand 10/10/2021   Abdominal bloating    Loss of weight    Lower abdominal pain    Polyp of colon    Multiple adenomatous polyps    Depression    HLD (hyperlipidemia)    GERD (gastroesophageal reflux disease)    Influenza A 01/26/2019   Marijuana use 06/12/2017   Diabetic neuropathy ([place]) 01/07/2016   Plantar fasciitis of left foot 11/26/2015   Back pain 10/10/2015   Asthma 09/29/2015   Essential hypertension 09/29/2015   Diabetes mellitus without complication ([place]) 68/02/2121    Phinneas Shakoor Ardath Sax, OT/L 10/31/2021, 12:35 PM  [facility] [address] [place], [place], [address] Phone: [phone]    Fax:  [phone]  Name: [name] MRN: [id number] Date of Birth: 11-Feb-1966

## 2021-10-31 NOTE — Progress Notes (Signed)
Office Visit Note   Patient: [name]           Date of Birth: [birth year]           MRN: [id number] Visit Date: 10/31/2021              Requested by: [name], [name],  [name] [id number] PCP: Crystal Carls, MD   Assessment & Plan: Visit Diagnoses:  1. Other diabetic neurological complication associated with type 2 diabetes mellitus ([place])   2. Pain in left hand     Plan: Discussed with patient that her symptoms seem much improved from her previous visit about 2 weeks ago.  She is been doing hand therapy and is quite happy with her progress so far.  She is able to make a complete fist today which she was unable to do at her previous visit.  She does describe continued pain at the mid aspect of the palm with range of motion of the fingers.  She also describes numbness in all of her fingers and all of her toes secondary to diabetic neuropathy.  She is seeing her primary care physician for her gabapentin dosing.  We talked about using Voltaren gel for her hand pain.  She will continue to do hand therapy and work on exercises at home as she has gotten significant improvement so far.  Follow-Up Instructions: No follow-ups on file.   Orders:  No orders of the defined types were placed in this encounter.  No orders of the defined types were placed in this encounter.     Procedures: No procedures performed   Clinical Data: No additional findings.   Subjective: Chief Complaint  Patient presents with   Left Hand - Follow-up    This is a 55 year old right-hand-dominant female who presents with continued left hand pain.  She was last seen about 2 weeks ago at which time she was describing pain in her fingers with an inability to make a fist secondary to pain and weakness.  She been doing hand therapy for last 2 weeks and has now been to make a complete fist.  She does describe some continued pain at the mid aspect of the palm this seems to be improved  from her previous visit.  She continues to have diabetic neuropathy with numbness and tingling of all of her fingers and toes.  She is seeing her primary care doctor who is giving her gabapentin which she thinks is helping some.  She has reported occasional dropping things of the last 2 weeks but notes that this is improved since starting therapy.   Review of Systems   Objective: Vital Signs: BP 131/83 (BP Location: Right Arm, Patient Position: Sitting, Cuff Size: Large)   Pulse 84   Ht 5\' 1"  (1.549 m)   Wt 163 lb (73.9 kg)   SpO2 97%   BMI 30.80 kg/m   Physical Exam Constitutional:      Appearance: Normal appearance.  Cardiovascular:     Rate and Rhythm: Normal rate.     Pulses: Normal pulses.  Pulmonary:     Effort: Pulmonary effort is normal.  Skin:    General: Skin is warm and dry.     Capillary Refill: Capillary refill takes less than 2 seconds.  Neurological:     Mental Status: She is alert.    Left Hand Exam   Tenderness  The patient is experiencing no tenderness.   Tests  Tinel's sign (median nerve): negative  Other  Erythema: absent Sensation: decreased Pulse: present  Comments:  Able to make a complete fist.  Able to make a hook fist.  Able to demonstrate intrinsic plus positioning of her fingers.  She is able to fully abduct and adduct all of her fingers with significant coaching.  She has normal first dorsal interosseous muscle bulk.  She has normal thenar strength with adequate coaching.     Specialty Comments:  No specialty comments available.  Imaging: No results found.   PMFS History: Patient Active Problem List   Diagnosis Date Noted   Pain in left hand 10/10/2021   Abdominal bloating    Loss of weight    Lower abdominal pain    Polyp of colon    Multiple adenomatous polyps    Depression    HLD (hyperlipidemia)    GERD (gastroesophageal reflux disease)    Influenza A 01/26/2019   Marijuana use 06/12/2017   Diabetic neuropathy ([place])  01/07/2016   Plantar fasciitis of left foot 11/26/2015   Back pain 10/10/2015   Asthma 09/29/2015   Essential hypertension 09/29/2015   Diabetes mellitus without complication ([place]) 36/14/4315   Past Medical History:  Diagnosis Date   Acute diverticulitis    Acute pain of right knee 10/14/2016   Acute renal failure (ARF) ([place]) 01/26/2019   Asthma    Depression    Diabetes mellitus without complication ([place])    Diverticulitis large intestine 05/12/2020   Hypertension     Family History  Problem Relation Age of Onset   Hypertension Mother     Past Surgical History:  Procedure Laterality Date   ABDOMINAL HYSTERECTOMY     COLONOSCOPY WITH PROPOFOL N/A 10/02/2021   Procedure: COLONOSCOPY WITH PROPOFOL;  Surgeon: Lin Landsman, MD;  Location: [facility] ENDOSCOPY;  Service: Gastroenterology;  Laterality: N/A;   ESOPHAGOGASTRODUODENOSCOPY N/A 10/02/2021   Procedure: ESOPHAGOGASTRODUODENOSCOPY (EGD);  Surgeon: Lin Landsman, MD;  Location: [facility] ENDOSCOPY;  Service: Gastroenterology;  Laterality: N/A;   TUBAL LIGATION     Social History   Occupational History   Not on file  Tobacco Use   Smoking status: Former    Packs/day: 3.00    Years: 10.00    Pack years: 30.00    Types: Cigarettes    Quit date: 12/22/2004    Years since quitting: 16.8   Smokeless tobacco: Never  Vaping Use   Vaping Use: Never used  Substance and Sexual Activity   Alcohol use: No   Drug use: No   Sexual activity: Not Currently

## 2021-11-01 LAB — MICROSCOPIC EXAMINATION: Bacteria, UA: NONE SEEN

## 2021-11-01 LAB — URINALYSIS, COMPLETE
Bilirubin, UA: NEGATIVE
Glucose, UA: NEGATIVE
Leukocytes,UA: NEGATIVE
Nitrite, UA: NEGATIVE
Protein,UA: NEGATIVE
RBC, UA: NEGATIVE
Specific Gravity, UA: 1.015 (ref 1.005–1.030)
Urobilinogen, Ur: 0.2 mg/dL (ref 0.2–1.0)
pH, UA: 6 (ref 5.0–7.5)

## 2021-11-06 ENCOUNTER — Telehealth: Payer: Self-pay

## 2021-11-06 ENCOUNTER — Telehealth: Payer: Self-pay | Admitting: Gastroenterology

## 2021-11-06 NOTE — Telephone Encounter (Signed)
Inbound call from pt returning a phone call

## 2021-11-06 NOTE — Telephone Encounter (Signed)
Called patient asked her about how much medicine she had left because still working with insurance to get her medicine approved for her so left samples at front for her for  a little while longer

## 2021-11-06 NOTE — Telephone Encounter (Signed)
Called patient to let her know  her insurance just will not pay for the linzess so we are gonna try trulance when she get sdown to one box of the samples left she will call us and we will get her some trulance samples

## 2021-11-07 ENCOUNTER — Ambulatory Visit (INDEPENDENT_AMBULATORY_CARE_PROVIDER_SITE_OTHER): Payer: 59 | Admitting: Occupational Therapy

## 2021-11-07 ENCOUNTER — Encounter: Payer: Self-pay | Admitting: Occupational Therapy

## 2021-11-07 ENCOUNTER — Other Ambulatory Visit: Payer: Self-pay

## 2021-11-07 DIAGNOSIS — R278 Other lack of coordination: Secondary | ICD-10-CM

## 2021-11-07 DIAGNOSIS — M25642 Stiffness of left hand, not elsewhere classified: Secondary | ICD-10-CM | POA: Diagnosis not present

## 2021-11-07 DIAGNOSIS — M79642 Pain in left hand: Secondary | ICD-10-CM | POA: Diagnosis not present

## 2021-11-07 DIAGNOSIS — M6281 Muscle weakness (generalized): Secondary | ICD-10-CM | POA: Diagnosis not present

## 2021-11-07 DIAGNOSIS — M25512 Pain in left shoulder: Secondary | ICD-10-CM

## 2021-11-07 NOTE — Therapy (Signed)
[facility] [address] [place], [place], [address] Phone: [phone]   Fax:  [phone]  Occupational Therapy Treatment  Patient Details  Name: [name] MRN: [id number] Date of Birth: [birth year] Referring Provider (OT): Dr Tempie Donning   Encounter Date: 11/07/2021   OT End of Session - 11/07/21 1240     Visit Number 4    Number of Visits 6    Date for OT Re-Evaluation 11/14/21   Every 10 visits   Authorization Type [employer]    Authorization - Visit Number 4    Progress Note Due on Visit 10    OT Start Time 1148    OT Stop Time 1233    OT Time Calculation (min) 45 min    Activity Tolerance Patient tolerated treatment well;No increased pain    Behavior During Therapy [facility] for tasks assessed/performed             Past Medical History:  Diagnosis Date   Acute diverticulitis    Acute pain of right knee 10/14/2016   Acute renal failure (ARF) ([place]) 01/26/2019   Asthma    Depression    Diabetes mellitus without complication ([place])    Diverticulitis large intestine 05/12/2020   Hypertension     Past Surgical History:  Procedure Laterality Date   ABDOMINAL HYSTERECTOMY     COLONOSCOPY WITH PROPOFOL N/A 10/02/2021   Procedure: COLONOSCOPY WITH PROPOFOL;  Surgeon: Lin Landsman, MD;  Location: [place];  Service: Gastroenterology;  Laterality: N/A;   ESOPHAGOGASTRODUODENOSCOPY N/A 10/02/2021   Procedure: ESOPHAGOGASTRODUODENOSCOPY (EGD);  Surgeon: Lin Landsman, MD;  Location: [facility] ENDOSCOPY;  Service: Gastroenterology;  Laterality: N/A;   TUBAL LIGATION      There were no vitals filed for this visit.   Subjective Assessment - 11/07/21 1157     Subjective  Pt reports increased pain in colder weather this week.    Pertinent History depression, HTN, DM, diabetic neuropathy in feet and hands, asthma, marijuana use, GERD, lower abdominal pain, abdominal bloating, polyps adenomatous, colon polyps,  diverticulitis, pain in knee.    Patient Stated Goals Get hand moving better    Currently in Pain? Yes    Pain Score 6     Pain Location Hand    Pain Orientation Left    Pain Descriptors / Indicators Aching;Discomfort    Pain Type Chronic pain    Pain Onset More than a month ago    Pain Frequency Intermittent    Multiple Pain Sites No                OT Treatments/Exercises (OP) - 11/07/21 0001       ADLs   ADL Comments Discussed possibly warming her hands up prior to performing ADL's and HEP as pt reports increased stiffnes and pain with colder weather. She is Mod I ADL's but sensitive to cold. Discussed wearing gloves/mittens and other ways to increase circulation prior to HEP.      Exercises   Exercises Hand;Theraputty;Shoulder;Elbow      Additional Elbow Exercises   UBE (Upper Arm Bike) UBE at 8 min rotating forward and back bilateral UE for recriprocal arm movement. Increased resistance to level 2 w/o difficulty.    Theraputty - Grip See above      Hand Exercises   Other Hand Exercises Pt performed large grip pegs, placing into pegboard & removing with L UE w/o c/o. Pt reports increased A/ROM after performing. Also performed HEP for forearm/carpal tunnel stretching with  forearm supinated, elbow extended with passive wrist extension and then forearm pronated, elbow extended with passive wrist extension L UE.    Other Hand Exercises Tendon gliding in the clinic today, I'ly when given increased time, Mod I HEP for gentle grip and pinches with red theraputty. Pt educated only to perform putty 1x/day as instructed.      Theraputty   Theraputty - Pinch See above      Manual Therapy   Manual Therapy Soft tissue mobilization;Other (comment)   Left volar palm and digits as well as retrograde massage x10 min               OT Education - 11/07/21 1239     Education Details Review and performance of HEP and median nerve gliding .    Person(s) Educated Patient    Methods  Explanation;Demonstration;Handout    Comprehension Verbalized understanding;Need further instruction              OT Short Term Goals - 11/07/21 1245       OT SHORT TERM GOAL #1   Title Pt will be Mod I initial HEP    Period Weeks    Target Date 11/07/21      OT SHORT TERM GOAL #2   Title Pt will be Mod I left hand functional use during ADL's and light functional tasks as non-dominant UE in clinic setting.    Time 3    Period Weeks    Status Achieved    Target Date 11/07/21               OT Long Term Goals - 11/07/21 1246       OT LONG TERM GOAL #1   Title Pt will be Mod I upgraded HEP for L hand as seen in clinic    Time 6    Period Weeks    Status On-going    Target Date 11/28/21      OT LONG TERM GOAL #2   Title Pt will report decreased left hand  pain as seen by rating of 5/10 or less    Time 6    Period Weeks    Status New    Target Date 11/28/21      OT LONG TERM GOAL #3   Title Pt will demonstrate improved grip strength as per JAMAR dynamometer assessment of 25# or greater.    Time 6    Period Weeks    Status New    Target Date 11/28/21      OT LONG TERM GOAL #4   Title Pt will be Mod I using left hand as assist during [facility] tasks in the clinic setting and will report independence with ADL's and homemaking tasks requiring tip to tip prehension (buttons, zippers, brushing teeth, bathing etc).    Time 6    Period Weeks    Status Achieved    Target Date 11/28/21                   Plan - 11/07/21 1241     Clinical Impression Statement Pt reports increased pain in colder weather today. A/ROM is WNL's and pain in L UE and volar palm does not follow any specific nerve compression patterns. She reports decreased pain after ther ex and massage in therapy. Advised to avoid repetetive grip and finger flexion, continue home program for nerve gliding and flexor stretching with forearm pronated and supinated. Pt has met STG's. Focus on LTG's and begin  d/c planning.    OT Occupational Profile and History Problem Focused Assessment - Including review of records relating to presenting problem    Occupational performance deficits (Please refer to evaluation for details): ADL's    Body Structure / Function / Physical Skills ADL;Strength;Dexterity;Pain;UE functional use;ROM;Sensation;Coordination;Flexibility;FMC    Rehab Potential Fair    Clinical Decision Making Limited treatment options, no task modification necessary    Comorbidities Affecting Occupational Performance: May have comorbidities impacting occupational performance    Modification or Assistance to Complete Evaluation  No modification of tasks or assist necessary to complete eval    OT Frequency 1x / week    OT Duration 6 weeks    OT Treatment/Interventions Self-care/ADL training;Fluidtherapy;DME and/or AE instruction;Therapeutic activities;Therapeutic exercise;Passive range of motion;Manual Therapy;Patient/family education    Plan UBE, light functional activity L UE, focus on LTG's and begin d/c planning.    Consulted and Agree with Plan of Care Patient             Patient will benefit from skilled therapeutic intervention in order to improve the following deficits and impairments:   Body Structure / Function / Physical Skills: ADL, Strength, Dexterity, Pain, UE functional use, ROM, Sensation, Coordination, Flexibility, [facility]       Visit Diagnosis: Pain in left hand  Stiffness of left hand, not elsewhere classified  Muscle weakness (generalized)  Other lack of coordination  Left shoulder pain, unspecified chronicity    Problem List Patient Active Problem List   Diagnosis Date Noted   Pain in left hand 10/10/2021   Abdominal bloating    Loss of weight    Lower abdominal pain    Polyp of colon    Multiple adenomatous polyps    Depression    HLD (hyperlipidemia)    GERD (gastroesophageal reflux disease)    Influenza A 01/26/2019   Marijuana use 06/12/2017    Diabetic neuropathy ([place]) 01/07/2016   Plantar fasciitis of left foot 11/26/2015   Back pain 10/10/2015   Asthma 09/29/2015   Essential hypertension 09/29/2015   Diabetes mellitus without complication ([place]) 70/62/3762    Truitt Cruey Ardath Sax, OT/L 11/07/2021, 12:52 PM  [facility] [address] [place], [place], [address] Phone: [phone]   Fax:  [phone]  Name: LORMA HEATER MRN: [id number] Date of Birth: 1966/07/11

## 2021-11-21 ENCOUNTER — Encounter: Payer: 59 | Admitting: Occupational Therapy

## 2021-11-21 ENCOUNTER — Encounter: Payer: Self-pay | Admitting: Obstetrics and Gynecology

## 2021-11-21 ENCOUNTER — Other Ambulatory Visit: Payer: Self-pay

## 2021-11-21 ENCOUNTER — Ambulatory Visit (INDEPENDENT_AMBULATORY_CARE_PROVIDER_SITE_OTHER): Payer: 59 | Admitting: Obstetrics and Gynecology

## 2021-11-21 VITALS — BP 130/74 | Ht 61.0 in | Wt 157.0 lb

## 2021-11-21 DIAGNOSIS — Z1231 Encounter for screening mammogram for malignant neoplasm of breast: Secondary | ICD-10-CM

## 2021-11-21 DIAGNOSIS — N8110 Cystocele, unspecified: Secondary | ICD-10-CM

## 2021-11-21 DIAGNOSIS — R0602 Shortness of breath: Secondary | ICD-10-CM

## 2021-11-21 DIAGNOSIS — N6311 Unspecified lump in the right breast, upper outer quadrant: Secondary | ICD-10-CM

## 2021-11-21 DIAGNOSIS — N6452 Nipple discharge: Secondary | ICD-10-CM | POA: Diagnosis not present

## 2021-11-21 NOTE — Patient Instructions (Addendum)
Waunita Schooner MD [phone]  [place], [place] [address]  Nobie Putnam, [place]  [facility] [place], [place] [address]   Lavon Paganini, MD [phone]  [facility] [address] [place], [place] [address]  Shortness of Breath, Adult Shortness of breath is when a person has trouble breathing or when a person feels like she or he is having trouble breathing in enough air. Shortness of breath could be a sign of a medical problem. Follow these instructions at home: Pollutants Do not use any products that contain nicotine or tobacco. These products include cigarettes, chewing tobacco, and vaping devices, such as e-cigarettes. This also includes cigars and pipes. If you need help quitting, ask your health care provider. Avoid things that can irritate your airways, including: Smoke. This includes campfire smoke, forest fire smoke, and secondhand smoke from tobacco products. Do not smoke or allow others to smoke in your home. Mold. Dust. Air pollution. Chemical fumes. Things that can give you an allergic reaction (allergens) if you have allergies. Common allergens include pollen from grasses or trees and animal dander. Keep your living space clean and free of mold and dust. General instructions Pay attention to any changes in your symptoms. Take over-the-counter and prescription medicines only as told by your health care provider. This includes oxygen therapy and inhaled medicines. Rest as needed. Return to your normal activities as told by your health care provider. Ask your health care provider what activities are safe for you. Keep all follow-up visits. This is important. Contact a health care provider if: Your condition does not improve as soon as expected. You have a hard time doing your normal activities, even after you rest. You have new symptoms. You cannot walk up stairs or exercise  the way that you normally do. Get help right away if: Your shortness of breath gets worse. You have shortness of breath when you are resting. You feel light-headed or you faint. You have a cough that is not controlled with medicines. You cough up blood. You have pain with breathing. You have pain in your chest, arms, shoulders, or abdomen. You have a fever. These symptoms may be an emergency. Get help right away. Call 911. Do not wait to see if the symptoms will go away. Do not drive yourself to the hospital. Summary Shortness of breath is when a person has trouble breathing enough air. It can be a sign of a medical problem. Avoid things that irritate your lungs, such as smoking, pollution, mold, and dust. Pay attention to changes in your symptoms and contact your health care provider if you have a hard time completing daily activities because of shortness of breath. This information is not intended to replace advice given to you by your health care provider. Make sure you discuss any questions you have with your health care provider. Document Revised: 07/27/2021 Document Reviewed: 07/27/2021 Elsevier Patient Education  [place].

## 2021-11-21 NOTE — Progress Notes (Signed)
Patient ID: [name], female   DOB: 1966/07/14, 55 y.o.   MRN: [id number]  Reason for Consult: Gynecologic Exam   Referred by Nori Riis, PA-C  Subjective:     HPI:  [name] is a 55 y.o. female. She has been experiencing vaginal pressure and bulge. She also reports right breast nipple discharge.   Gynecological History  No LMP recorded. Patient has had a hysterectomy. Menarche: 46  History of fibroids, polyps, or ovarian cysts? : polyps  History of PCOS? no Hstory of Endometriosis? no History of abnormal pap smears? no Have you had any sexually transmitted infections in the past? no  She identifies as a female. She is not sexually active.   Past Medical History:  Diagnosis Date   Acute diverticulitis    Acute pain of right knee 10/14/2016   Acute renal failure (ARF) ([place]) 01/26/2019   Asthma    Depression    Diabetes mellitus without complication ([place])    Diverticulitis large intestine 05/12/2020   Hypertension    Family History  Problem Relation Age of Onset   Hypertension Mother    Past Surgical History:  Procedure Laterality Date   ABDOMINAL HYSTERECTOMY     COLONOSCOPY WITH PROPOFOL N/A 10/02/2021   Procedure: COLONOSCOPY WITH PROPOFOL;  Surgeon: Lin Landsman, MD;  Location: [place];  Service: Gastroenterology;  Laterality: N/A;   ESOPHAGOGASTRODUODENOSCOPY N/A 10/02/2021   Procedure: ESOPHAGOGASTRODUODENOSCOPY (EGD);  Surgeon: Lin Landsman, MD;  Location: [facility] ENDOSCOPY;  Service: Gastroenterology;  Laterality: N/A;   TUBAL LIGATION      Short Social History:  Social History   Tobacco Use   Smoking status: Former    Packs/day: 3.00    Years: 10.00    Pack years: 30.00    Types: Cigarettes    Quit date: 12/22/2004    Years since quitting: 16.9   Smokeless tobacco: Never  Substance Use Topics   Alcohol use: No    No Known Allergies  Current Outpatient Medications  Medication Sig Dispense Refill    acetaminophen (TYLENOL) 325 MG tablet Take 2 tablets (650 mg total) by mouth every 6 (six) hours as needed for mild pain or fever. 30 tablet 0   albuterol (PROVENTIL HFA;VENTOLIN HFA) 108 (90 Base) MCG/ACT inhaler Inhale 1-2 puffs into the lungs every 6 (six) hours as needed for wheezing or shortness of breath. 1 Inhaler 0   albuterol (PROVENTIL) (2.5 MG/3ML) 0.083% nebulizer solution Take 3 mLs (2.5 mg total) by nebulization every 2 (two) hours as needed for wheezing or shortness of breath. 75 mL 0   aspirin EC 81 MG tablet Take 81 mg by mouth daily.     atorvastatin (LIPITOR) 10 MG tablet Take 10 mg by mouth daily.     Cholecalciferol (VITAMIN D3) 1.25 MG (50000 UT) CAPS Take 1 capsule by mouth once a week.     cyclobenzaprine (FLEXERIL) 5 MG tablet TAKE 1 TABLET BY MOUTH NIGHTLY AS NEEDEDFOR MUSCLE SPASMS     diclofenac sodium (VOLTAREN) 1 % GEL Apply 2 g topically 4 (four) times daily as needed (pain). (Patient taking differently: Apply 2 g topically in the morning, at noon, in the evening, and at bedtime.) 1 Tube 0   DULoxetine (CYMBALTA) 20 MG capsule Take by mouth.     gabapentin (NEURONTIN) 600 MG tablet Take by mouth.     glipiZIDE (GLUCOTROL) 5 MG tablet Take 1 tablet (5 mg total) by mouth every morning. 30 tablet 0  hydrochlorothiazide (HYDRODIURIL) 25 MG tablet Take 25 mg by mouth daily.     linaclotide (LINZESS) 290 MCG CAPS capsule Take 1 capsule (290 mcg total) by mouth daily before breakfast. 90 capsule 2   loratadine (CLARITIN) 10 MG tablet Take 1 tablet (10 mg total) by mouth daily. 30 tablet 0   losartan (COZAAR) 50 MG tablet Take 1 tablet (50 mg total) by mouth daily. 30 tablet 1   metoprolol tartrate (LOPRESSOR) 50 MG tablet Take by mouth.     omeprazole (PRILOSEC) 20 MG capsule Take 2 capsules (40 mg total) by mouth daily. (Patient taking differently: Take 20 mg by mouth daily.) 60 capsule 0   polyethylene glycol (MIRALAX / GLYCOLAX) 17 g packet Take 17 g by mouth 2 (two)  times daily. 30 each 1   sertraline (ZOLOFT) 50 MG tablet Take 1.5 tablets (75 mg total) by mouth every morning. 45 tablet 0   traMADol (ULTRAM) 50 MG tablet Take by mouth every 6 (six) hours as needed.     metFORMIN (GLUCOPHAGE) 1000 MG tablet Take 1 tablet (1,000 mg total) by mouth 2 (two) times daily with a meal. See discharge instructions for how to take medicine at first 60 tablet 1   Semaglutide (RYBELSUS) 7 MG TABS Take by mouth. (Patient not taking: Reported on 11/21/2021)     Ubrogepant 100 MG TABS Take 100 mg by mouth daily as needed. (Patient not taking: Reported on 11/21/2021)     No current facility-administered medications for this visit.    Review of Systems  Constitutional: Positive for fatigue.  Eyes: Positive for loss of vision.   Respiratory: Positive for cough, shortness of breath and wheezing.  Cardiovascular: Positive for chest pain and orthopnea.  GI: Positive for diarrhea.  Neurological: Positive for dizziness, headaches and numbness.  Hematologic: Positive for bruises/bleeds easily.  Psychiatric: Positive for depressed mood.       Objective:  Objective   Vitals:   11/21/21 1029  BP: 130/74  Weight: 157 lb (71.2 kg)  Height: 5\' 1"  (1.549 m)   Body mass index is 29.66 kg/m.  Physical Exam Vitals and nursing note reviewed. Exam conducted with a chaperone present.  Constitutional:      Appearance: Normal appearance.  HENT:     Head: Normocephalic and atraumatic.  Eyes:     Extraocular Movements: Extraocular movements intact.     Pupils: Pupils are equal, round, and reactive to light.  Cardiovascular:     Rate and Rhythm: Normal rate and regular rhythm.  Pulmonary:     Effort: Pulmonary effort is normal.     Breath sounds: Normal breath sounds.  Abdominal:     General: Abdomen is flat.     Palpations: Abdomen is soft.  Genitourinary:    Comments: STAGE 2 CYSTOCELE NOTED  breast equal without skin changes, Right breast nipple discharge, right  breast lump at 11 o'clock. no enlarged lymph nodes  Musculoskeletal:     Cervical back: Normal range of motion.  Skin:    General: Skin is warm and dry.  Neurological:     General: No focal deficit present.     Mental Status: She is alert and oriented to person, place, and time.  Psychiatric:        Behavior: Behavior normal.        Thought Content: Thought content normal.        Judgment: Judgment normal.    Assessment/Plan:     55 yo  Stage 2 prolapse- reviewed  treatment options Breast lump and nipple discahrge, will refer for further imaging and evaluation Referral to cardiology and pulmonology for multiple complaints of chest pain and SOB.   More than 20 minutes were spent face to face with the patient in the room, reviewing the medical record, labs and images, and coordinating care for the patient. The plan of management was discussed in detail and counseling was provided.      Adrian Prows MD [facility], [facility] 11/21/2021 11:11 AM

## 2021-11-25 ENCOUNTER — Other Ambulatory Visit: Payer: Self-pay

## 2021-11-25 ENCOUNTER — Ambulatory Visit (INDEPENDENT_AMBULATORY_CARE_PROVIDER_SITE_OTHER): Payer: 59 | Admitting: Obstetrics and Gynecology

## 2021-11-25 ENCOUNTER — Encounter: Payer: Self-pay | Admitting: Obstetrics and Gynecology

## 2021-11-25 ENCOUNTER — Telehealth: Payer: Self-pay

## 2021-11-25 VITALS — BP 128/78 | Ht 61.5 in | Wt 156.0 lb

## 2021-11-25 DIAGNOSIS — N8110 Cystocele, unspecified: Secondary | ICD-10-CM

## 2021-11-25 NOTE — Telephone Encounter (Signed)
Contacted patient via phone. Left voicemail for patient to call back to be scheduled 

## 2021-11-25 NOTE — Telephone Encounter (Signed)
Patient is scheduled for 11/25/21 with [facility]

## 2021-11-25 NOTE — Telephone Encounter (Signed)
-----   Message from [place], LPN sent at 36/0/1658  3:20 PM EST ----- Regarding: FW: Pessary Pessary is available. Please contact patient to schedule appointment for placement at her convenience ----- Message ----- From: Otila Kluver, LPN Sent: 00/05/3493   3:23 PM EST To: Otila Kluver, LPN, Homero Fellers, MD Subject: Pessary                                        #2 ring w/support/knob ordered for this patient

## 2021-11-25 NOTE — Progress Notes (Signed)
Patient ID: [name], female   DOB: Apr 09, 1966, 55 y.o.   MRN: [id number]  Reason for Consult: Gynecologic Exam   Referred by [name], MD  Subjective:     HPI:  [name] is a 55 y.o. female she is following up today for pessary placement.  Gynecological History  No LMP recorded. Patient has had a hysterectomy.  Past Medical History:  Diagnosis Date   Acute diverticulitis    Acute pain of right knee 10/14/2016   Acute renal failure (ARF) ([place]) 01/26/2019   Asthma    Depression    Diabetes mellitus without complication ([place])    Diverticulitis large intestine 05/12/2020   Hypertension    Family History  Problem Relation Age of Onset   Hypertension Mother    Past Surgical History:  Procedure Laterality Date   ABDOMINAL HYSTERECTOMY     COLONOSCOPY WITH PROPOFOL N/A 10/02/2021   Procedure: COLONOSCOPY WITH PROPOFOL;  Surgeon: Lin Landsman, MD;  Location: [facility];  Service: Gastroenterology;  Laterality: N/A;   ESOPHAGOGASTRODUODENOSCOPY N/A 10/02/2021   Procedure: ESOPHAGOGASTRODUODENOSCOPY (EGD);  Surgeon: Lin Landsman, MD;  Location: [facility] ENDOSCOPY;  Service: Gastroenterology;  Laterality: N/A;   TUBAL LIGATION      Short Social History:  Social History   Tobacco Use   Smoking status: Former    Packs/day: 3.00    Years: 10.00    Pack years: 30.00    Types: Cigarettes    Quit date: 12/22/2004    Years since quitting: 16.9   Smokeless tobacco: Never  Substance Use Topics   Alcohol use: No    No Known Allergies  Current Outpatient Medications  Medication Sig Dispense Refill   acetaminophen (TYLENOL) 325 MG tablet Take 2 tablets (650 mg total) by mouth every 6 (six) hours as needed for mild pain or fever. 30 tablet 0   albuterol (PROVENTIL HFA;VENTOLIN HFA) 108 (90 Base) MCG/ACT inhaler Inhale 1-2 puffs into the lungs every 6 (six) hours as needed for wheezing or shortness of breath. 1 Inhaler 0   albuterol (PROVENTIL)  (2.5 MG/3ML) 0.083% nebulizer solution Take 3 mLs (2.5 mg total) by nebulization every 2 (two) hours as needed for wheezing or shortness of breath. 75 mL 0   aspirin EC 81 MG tablet Take 81 mg by mouth daily.     atorvastatin (LIPITOR) 10 MG tablet Take 10 mg by mouth daily.     Cholecalciferol (VITAMIN D3) 1.25 MG (50000 UT) CAPS Take 1 capsule by mouth once a week.     cyclobenzaprine (FLEXERIL) 5 MG tablet TAKE 1 TABLET BY MOUTH NIGHTLY AS NEEDEDFOR MUSCLE SPASMS     diclofenac sodium (VOLTAREN) 1 % GEL Apply 2 g topically 4 (four) times daily as needed (pain). (Patient taking differently: Apply 2 g topically in the morning, at noon, in the evening, and at bedtime.) 1 Tube 0   DULoxetine (CYMBALTA) 20 MG capsule Take by mouth.     gabapentin (NEURONTIN) 600 MG tablet Take by mouth.     glipiZIDE (GLUCOTROL) 5 MG tablet Take 1 tablet (5 mg total) by mouth every morning. 30 tablet 0   hydrochlorothiazide (HYDRODIURIL) 25 MG tablet Take 25 mg by mouth daily.     linaclotide (LINZESS) 290 MCG CAPS capsule Take 1 capsule (290 mcg total) by mouth daily before breakfast. 90 capsule 2   loratadine (CLARITIN) 10 MG tablet Take 1 tablet (10 mg total) by mouth daily. 30 tablet 0   losartan (COZAAR) 50  MG tablet Take 1 tablet (50 mg total) by mouth daily. 30 tablet 1   metoprolol tartrate (LOPRESSOR) 50 MG tablet Take by mouth.     omeprazole (PRILOSEC) 20 MG capsule Take 2 capsules (40 mg total) by mouth daily. (Patient taking differently: Take 20 mg by mouth daily.) 60 capsule 0   polyethylene glycol (MIRALAX / GLYCOLAX) 17 g packet Take 17 g by mouth 2 (two) times daily. 30 each 1   Semaglutide (RYBELSUS) 7 MG TABS Take by mouth.     sertraline (ZOLOFT) 50 MG tablet Take 1.5 tablets (75 mg total) by mouth every morning. 45 tablet 0   traMADol (ULTRAM) 50 MG tablet Take by mouth every 6 (six) hours as needed.     Ubrogepant 100 MG TABS Take 100 mg by mouth daily as needed.     metFORMIN (GLUCOPHAGE)  1000 MG tablet Take 1 tablet (1,000 mg total) by mouth 2 (two) times daily with a meal. See discharge instructions for how to take medicine at first 60 tablet 1   No current facility-administered medications for this visit.    Review of Systems  Constitutional: Negative for chills, fatigue, fever and unexpected weight change.  HENT: Negative for trouble swallowing.  Eyes: Negative for loss of vision.  Respiratory: Negative for cough, shortness of breath and wheezing.  Cardiovascular: Negative for chest pain, leg swelling, palpitations and syncope.  GI: Negative for abdominal pain, blood in stool, diarrhea, nausea and vomiting.  GU: Negative for difficulty urinating, dysuria, frequency and hematuria.  Musculoskeletal: Negative for back pain, leg pain and joint pain.  Skin: Negative for rash.  Neurological: Negative for dizziness, headaches, light-headedness, numbness and seizures.  Psychiatric: Negative for behavioral problem, confusion, depressed mood and sleep disturbance.       Objective:  Objective   Vitals:   11/25/21 1636  BP: 128/78  Weight: 156 lb (70.8 kg)  Height: 5' 1.5" (1.562 m)   Body mass index is 29 kg/m.  Physical Exam Vitals and nursing note reviewed. Exam conducted with a chaperone present.  Constitutional:      Appearance: Normal appearance. She is well-developed.  HENT:     Head: Normocephalic and atraumatic.  Eyes:     Extraocular Movements: Extraocular movements intact.     Pupils: Pupils are equal, round, and reactive to light.  Cardiovascular:     Rate and Rhythm: Normal rate and regular rhythm.  Pulmonary:     Effort: Pulmonary effort is normal. No respiratory distress.     Breath sounds: Normal breath sounds.  Abdominal:     General: Abdomen is flat.     Palpations: Abdomen is soft.  Genitourinary:    Comments: External: Normal appearing vulva. No lesions noted.  Musculoskeletal:        General: No signs of injury.  Skin:    General: Skin  is warm and dry.  Neurological:     Mental Status: She is alert and oriented to person, place, and time.  Psychiatric:        Behavior: Behavior normal.        Thought Content: Thought content normal.        Judgment: Judgment normal.    Assessment/Plan:    55 yo with prolapse who desires management with pessary Size 2 ring with support and knob placed Follow up in 3 months for pessary cleaning  More than 20 minutes were spent face to face with the patient in the room, reviewing the medical record, labs and  images, and coordinating care for the patient. The plan of management was discussed in detail and counseling was provided.    Adrian Prows MD [facility], [facility] 11/25/2021 4:43 PM

## 2021-11-26 ENCOUNTER — Ambulatory Visit (INDEPENDENT_AMBULATORY_CARE_PROVIDER_SITE_OTHER): Payer: 59 | Admitting: Occupational Therapy

## 2021-11-26 ENCOUNTER — Ambulatory Visit (INDEPENDENT_AMBULATORY_CARE_PROVIDER_SITE_OTHER): Payer: 59 | Admitting: Orthopedic Surgery

## 2021-11-26 ENCOUNTER — Encounter: Payer: Self-pay | Admitting: Occupational Therapy

## 2021-11-26 DIAGNOSIS — M79642 Pain in left hand: Secondary | ICD-10-CM

## 2021-11-26 DIAGNOSIS — R278 Other lack of coordination: Secondary | ICD-10-CM | POA: Diagnosis not present

## 2021-11-26 DIAGNOSIS — M25642 Stiffness of left hand, not elsewhere classified: Secondary | ICD-10-CM | POA: Diagnosis not present

## 2021-11-26 DIAGNOSIS — M6281 Muscle weakness (generalized): Secondary | ICD-10-CM

## 2021-11-26 NOTE — Progress Notes (Signed)
Office Visit Note   Patient: [name]           Date of Birth: Apr 07, 1966           MRN: [id number] Visit Date: 11/26/2021              Requested by: [name], [address] [place],  [place] [address] PCP: [name], MD   Assessment & Plan: Visit Diagnoses:  1. Pain in left hand     Plan: Patient is doing great today.  She has full and painless ROM of all of her fingers.  She still has mild pain at mid aspect of palm but this has improved.  She has been discharged from therapy and will be working on ROM and strengthening exercises at home.  She can see me in the future as needed.   Follow-Up Instructions: No follow-ups on file.   Orders:  No orders of the defined types were placed in this encounter.  No orders of the defined types were placed in this encounter.     Procedures: No procedures performed   Clinical Data: No additional findings.   Subjective: No chief complaint on file.   This is a 55 year old right-hand-dominant female who presents for follow up of left hand pain.  She initially described pain in all of her fingers with an inability to make a fist secondary to pain and stiffness.  She has been working on hard in therapy.  Today she is able to make a full and complete fist without any pain.  Her grip strength has increased tremendously.  She is overall very pleased with her progress.     Review of Systems   Objective: Vital Signs: There were no vitals taken for this visit.  Physical Exam Constitutional:      Appearance: Normal appearance.  Cardiovascular:     Rate and Rhythm: Normal rate.     Pulses: Normal pulses.  Pulmonary:     Effort: Pulmonary effort is normal.  Skin:    General: Skin is warm and dry.     Capillary Refill: Capillary refill takes less than 2 seconds.  Neurological:     Mental Status: She is alert.    Right Hand Exam   Tenderness  The patient is experiencing no tenderness.   Range of Motion   The patient has normal right wrist ROM.   Muscle Strength  The patient has normal right wrist strength.  Other  Erythema: absent Sensation: normal Pulse: present  Comments:  Able to make full, complete fist without any pain.      Specialty Comments:  No specialty comments available.  Imaging: No results found.   PMFS History: Patient Active Problem List   Diagnosis Date Noted   Pain in left hand 10/10/2021   Abdominal bloating    Loss of weight    Lower abdominal pain    Polyp of colon    Multiple adenomatous polyps    Depression    HLD (hyperlipidemia)    GERD (gastroesophageal reflux disease)    Influenza A 01/26/2019   Marijuana use 06/12/2017   Diabetic neuropathy ([place]) 01/07/2016   Plantar fasciitis of left foot 11/26/2015   Back pain 10/10/2015   Asthma 09/29/2015   Essential hypertension 09/29/2015   Diabetes mellitus without complication ([place]) 93/23/5573   Past Medical History:  Diagnosis Date   Acute diverticulitis    Acute pain of right knee 10/14/2016   Acute renal failure (ARF) ([place]) 01/26/2019  Asthma    Depression    Diabetes mellitus without complication ([place])    Diverticulitis large intestine 05/12/2020   Hypertension     Family History  Problem Relation Age of Onset   Hypertension Mother     Past Surgical History:  Procedure Laterality Date   ABDOMINAL HYSTERECTOMY     COLONOSCOPY WITH PROPOFOL N/A 10/02/2021   Procedure: COLONOSCOPY WITH PROPOFOL;  Surgeon: Lin Landsman, MD;  Location: [facility] ENDOSCOPY;  Service: Gastroenterology;  Laterality: N/A;   ESOPHAGOGASTRODUODENOSCOPY N/A 10/02/2021   Procedure: ESOPHAGOGASTRODUODENOSCOPY (EGD);  Surgeon: Lin Landsman, MD;  Location: [facility] ENDOSCOPY;  Service: Gastroenterology;  Laterality: N/A;   TUBAL LIGATION     Social History   Occupational History   Not on file  Tobacco Use   Smoking status: Former    Packs/day: 3.00    Years: 10.00    Pack years: 30.00    Types:  Cigarettes    Quit date: 12/22/2004    Years since quitting: 16.9   Smokeless tobacco: Never  Vaping Use   Vaping Use: Never used  Substance and Sexual Activity   Alcohol use: No   Drug use: No   Sexual activity: Not Currently

## 2021-11-26 NOTE — Therapy (Signed)
[facility] [address] [place], [place], [address] Phone: [phone]   Fax:  [phone]  Occupational Therapy Treatment and Discharge Summary  Patient Details  Name: [name] MRN: [id number] Date of Birth: June 26, 1966 Referring Provider (OT): Dr Tempie Donning   Encounter Date: 11/26/2021   OT End of Session - 11/26/21 1220     Visit Number 5    Number of Visits 6    Date for OT Re-Evaluation --   Every 10 visits   Authorization Type [employer]    Authorization - Visit Number 5    Progress Note Due on Visit 10    OT Start Time 1139    OT Stop Time 1217    OT Time Calculation (min) 38 min    Activity Tolerance Patient tolerated treatment well;No increased pain    Behavior During Therapy [facility] for tasks assessed/performed             Past Medical History:  Diagnosis Date   Acute diverticulitis    Acute pain of right knee 10/14/2016   Acute renal failure (ARF) ([place]) 01/26/2019   Asthma    Depression    Diabetes mellitus without complication ([place])    Diverticulitis large intestine 05/12/2020   Hypertension     Past Surgical History:  Procedure Laterality Date   ABDOMINAL HYSTERECTOMY     COLONOSCOPY WITH PROPOFOL N/A 10/02/2021   Procedure: COLONOSCOPY WITH PROPOFOL;  Surgeon: Lin Landsman, MD;  Location: [facility];  Service: Gastroenterology;  Laterality: N/A;   ESOPHAGOGASTRODUODENOSCOPY N/A 10/02/2021   Procedure: ESOPHAGOGASTRODUODENOSCOPY (EGD);  Surgeon: Lin Landsman, MD;  Location: [facility] ENDOSCOPY;  Service: Gastroenterology;  Laterality: N/A;   TUBAL LIGATION      There were no vitals filed for this visit.   Subjective Assessment - 11/26/21 1143     Subjective  Pt reports that her left hand has been feeling better and is using voltaren gel on it. She reports overall decreased pain.    Pertinent History depression, HTN, DM, diabetic neuropathy in feet and hands, asthma, marijuana use, GERD,  lower abdominal pain, abdominal bloating, polyps adenomatous, colon polyps, diverticulitis, pain in knee.    Patient Stated Goals Get hand moving better    Currently in Pain? Yes    Pain Score 3     Pain Location Hand    Pain Orientation Left    Pain Descriptors / Indicators Discomfort    Pain Type Chronic pain    Pain Onset More than a month ago    Pain Frequency Intermittent    Multiple Pain Sites No                [facility] OT Assessment - 11/26/21 0001       ROM / Strength   AROM / PROM / Strength AROM;Strength      Hand Function   Left Hand Grip (lbs) 45.6    Left Hand Lateral Pinch 11 lbs    Left 3 point pinch 11 lbs                OT Treatments/Exercises (OP) - 11/26/21 0001       ADLs   ADL Comments Pt reports overall decreased pain in left hand with use of voltaren gel and warming her hand before performing her HEP. She also reports decreased symptoms with functional use.   She reports Mod I ADL's and light homemaking w/o increased pain or symptoms in left hand. Pt reports decreased pain  after performing ther ex. at home and in hte clinic.     Exercises   Exercises Hand;Theraputty;Shoulder;Elbow      Hand Exercises   Other Hand Exercises Pt reports increased A/ROM after performing ther ex in the clinic. Also performed HEP for forearm/carpal tunnel stretching with forearm supinated, elbow extended with passive wrist extension and then forearm pronated, elbow extended with passive wrist extension L UE.    Other Hand Exercises Tendon gliding in the clinic today, I'ly when given increased time, Mod I HEP for gentle grip and pinches with red theraputty. Pt educated only to perform putty 1x/day as instructed.      Manual Therapy   Manual Therapy Soft tissue mobilization;Other (comment)   Left volar palm   Manual therapy comments Pt without edema noted left hand. She is Mod I HEP, edema control and reports decreased pain with performance of HEP, stretching as well  as voltaren gel use and wraming her hand prior to ex.               OT Education - 11/26/21 1220     Education Details Continue HEP I'ly and medication use as prescribed by MD    Person(s) Educated Patient    Methods Explanation;Demonstration    Comprehension Verbalized understanding;Returned demonstration              OT Short Term Goals - 11/07/21 1245       OT SHORT TERM GOAL #1   Title Pt will be Mod I initial HEP    Period Weeks    Target Date 11/07/21      OT SHORT TERM GOAL #2   Title Pt will be Mod I left hand functional use during ADL's and light functional tasks as non-dominant UE in clinic setting.    Time 3    Period Weeks    Status Achieved    Target Date 11/07/21               OT Long Term Goals - 11/26/21 1154       OT LONG TERM GOAL #1   Title Pt will be Mod I upgraded HEP for L hand as seen in clinic    Time 6    Period Weeks    Status Achieved    Target Date 11/28/21      OT LONG TERM GOAL #2   Title Pt will report decreased left hand  pain as seen by rating of 5/10 or less    Time 6    Period Weeks    Status Achieved   Pt rates as 3/10 at d/c left hand   Target Date 11/28/21      OT LONG TERM GOAL #3   Title Pt will demonstrate improved grip strength as per JAMAR dynamometer assessment of 25# or greater.    Time 6    Period Weeks    Status Achieved   Pt L hand JAMAR assessment = 45.6# w/o c/o pain   Target Date 11/28/21      OT LONG TERM GOAL #4   Title Pt will be Mod I using left hand as assist during FMC tasks in the clinic setting and will report independence with ADL's and homemaking tasks requiring tip to tip prehension (buttons, zippers, brushing teeth, bathing etc).    Time 6    Period Weeks    Status Achieved    Target Date 11/28/21                  Plan - 11/26/21 1221     Clinical Impression Statement Pt has met all LTG's at this time. A/ROM is WNL's left hand and she reports decreased pain in left hand  with performance of HEP and functional activity as well as use of voltaren gel as prescribed by MD. She is independent with her home exercise program. She reports that her pain has decreased greatly since initial visit. When she does report pain, it is in her volar palm/left hand and does not follow any specific nerve compression patterns. She will be discharged to independent home program at this time.    OT Occupational Profile and History Problem Focused Assessment - Including review of records relating to presenting problem    Occupational performance deficits (Please refer to evaluation for details): ADL's    Body Structure / Function / Physical Skills ADL;Strength;Dexterity;Pain;UE functional use;ROM;Sensation;Coordination;Flexibility;FMC    Rehab Potential Fair    Clinical Decision Making Limited treatment options, no task modification necessary    Comorbidities Affecting Occupational Performance: May have comorbidities impacting occupational performance    Modification or Assistance to Complete Evaluation  No modification of tasks or assist necessary to complete eval    OT Frequency 1x / week    OT Duration 6 weeks    OT Treatment/Interventions Self-care/ADL training;Fluidtherapy;DME and/or AE instruction;Therapeutic activities;Therapeutic exercise;Passive range of motion;Manual Therapy;Patient/family education    Plan D/C to independent HEP, sign off at this time.    Consulted and Agree with Plan of Care Patient             Patient will benefit from skilled therapeutic intervention in order to improve the following deficits and impairments:   Body Structure / Function / Physical Skills: ADL, Strength, Dexterity, Pain, UE functional use, ROM, Sensation, Coordination, Flexibility, [facility]       Visit Diagnosis: Pain in left hand  Stiffness of left hand, not elsewhere classified  Muscle weakness (generalized)  Other lack of coordination    Problem List Patient Active Problem  List   Diagnosis Date Noted   Pain in left hand 10/10/2021   Abdominal bloating    Loss of weight    Lower abdominal pain    Polyp of colon    Multiple adenomatous polyps    Depression    HLD (hyperlipidemia)    GERD (gastroesophageal reflux disease)    Influenza A 01/26/2019   Marijuana use 06/12/2017   Diabetic neuropathy ([place]) 01/07/2016   Plantar fasciitis of left foot 11/26/2015   Back pain 10/10/2015   Asthma 09/29/2015   Essential hypertension 09/29/2015   Diabetes mellitus without complication ([place]) 63/87/5643    [name], [place], OT 11/26/2021, 12:29 PM  [facility] [address] [place], [place], [address] Phone: [phone]   Fax:  [phone]  Name: [name] MRN: [id number] Date of Birth: 07-Feb-1966

## 2021-11-29 ENCOUNTER — Other Ambulatory Visit: Payer: Self-pay

## 2021-11-29 ENCOUNTER — Ambulatory Visit
Admission: RE | Admit: 2021-11-29 | Discharge: 2021-11-29 | Disposition: A | Discharge status: Home / Self Care | Payer: 59 | Source: Ambulatory Visit | Attending: Obstetrics and Gynecology | Admitting: Obstetrics and Gynecology

## 2021-11-29 DIAGNOSIS — Z1231 Encounter for screening mammogram for malignant neoplasm of breast: Secondary | ICD-10-CM | POA: Diagnosis not present

## 2021-12-03 ENCOUNTER — Encounter: Payer: 59 | Admitting: Occupational Therapy

## 2021-12-12 ENCOUNTER — Ambulatory Visit: Payer: 59 | Admitting: Obstetrics and Gynecology

## 2021-12-20 ENCOUNTER — Telehealth: Payer: Self-pay | Admitting: Obstetrics and Gynecology

## 2021-12-20 NOTE — Telephone Encounter (Signed)
Called patient to reschedule March 6th appt with CRS.  Left message for patient to call back to reschedule.

## 2021-12-22 HISTORY — PX: OTHER SURGICAL HISTORY: SHX169

## 2021-12-26 ENCOUNTER — Ambulatory Visit (INDEPENDENT_AMBULATORY_CARE_PROVIDER_SITE_OTHER): Payer: 59 | Admitting: Cardiology

## 2021-12-26 ENCOUNTER — Ambulatory Visit (INDEPENDENT_AMBULATORY_CARE_PROVIDER_SITE_OTHER): Payer: 59

## 2021-12-26 ENCOUNTER — Encounter: Payer: Self-pay | Admitting: Cardiology

## 2021-12-26 ENCOUNTER — Other Ambulatory Visit: Payer: Self-pay

## 2021-12-26 VITALS — BP 130/100 | HR 64 | Ht 61.0 in | Wt 154.0 lb

## 2021-12-26 DIAGNOSIS — R6889 Other general symptoms and signs: Secondary | ICD-10-CM

## 2021-12-26 DIAGNOSIS — R079 Chest pain, unspecified: Secondary | ICD-10-CM | POA: Diagnosis not present

## 2021-12-26 DIAGNOSIS — E1169 Type 2 diabetes mellitus with other specified complication: Secondary | ICD-10-CM

## 2021-12-26 DIAGNOSIS — R0609 Other forms of dyspnea: Secondary | ICD-10-CM

## 2021-12-26 DIAGNOSIS — J45909 Unspecified asthma, uncomplicated: Secondary | ICD-10-CM

## 2021-12-26 DIAGNOSIS — R072 Precordial pain: Secondary | ICD-10-CM

## 2021-12-26 DIAGNOSIS — R011 Cardiac murmur, unspecified: Secondary | ICD-10-CM

## 2021-12-26 DIAGNOSIS — I10 Essential (primary) hypertension: Secondary | ICD-10-CM

## 2021-12-26 DIAGNOSIS — R002 Palpitations: Secondary | ICD-10-CM

## 2021-12-26 DIAGNOSIS — R0602 Shortness of breath: Secondary | ICD-10-CM | POA: Insufficient documentation

## 2021-12-26 DIAGNOSIS — E785 Hyperlipidemia, unspecified: Secondary | ICD-10-CM

## 2021-12-26 NOTE — Patient Instructions (Signed)
Medication Instructions:  Your physician recommends that you continue on your current medications as directed. Please refer to the Current Medication list given to you today.   *If you need a refill on your cardiac medications before your next appointment, please call your pharmacy*   Lab Work: We will draw labs today (BMP) prior to you leaving our office.   If you have labs (blood work) drawn today and your tests are completely normal, you will receive your results only by: [place] (if you have MyChart) OR A paper copy in the mail If you have any lab test that is abnormal or we need to change your treatment, we will call you to review the results.   Testing/Procedures: Echocardiogram - Your physician has requested that you have an echocardiogram in 2-3 weeks. Echocardiography is a painless test that uses sound waves to create images of your heart. It provides your doctor with information about the size and shape of your heart and how well your hearts chambers and valves are working. This procedure takes approximately one hour. There are no restrictions for this procedure.   2. Your cardiac CT will be scheduled at:  [facility] [address] [place], [place] [address] [phone]  Please arrive 15 mins early for check-in and test prep.    Please follow these instructions carefully (unless otherwise directed):  On the Night Before the Test: Be sure to Drink plenty of water. Do not consume any caffeinated/decaffeinated beverages or chocolate 12 hours prior to your test.  On the Day of the Test: Drink plenty of water until 1 hour prior to the test. Do not eat any food 4 hours prior to the test. You may take your regular medications prior to the test.  Take metoprolol (Lopressor) two hours prior to test. HOLD Hydrochlorothiazide morning of the test. FEMALES- please wear underwire-free bra if available, avoid dresses & tight  clothing      After the Test: Drink plenty of water. After receiving IV contrast, you may experience a mild flushed feeling. This is normal. On occasion, you may experience a mild rash up to 24 hours after the test. This is not dangerous. If this occurs, you can take Benadryl 25 mg and increase your fluid intake. If you experience trouble breathing, this can be serious. If it is severe call 911 IMMEDIATELY. If it is mild, please call our office. If you take any of these medications: Glipizide/Metformin, Avandament, Glucavance, please do not take 48 hours after completing test unless otherwise instructed.  Please allow 2-4 weeks for scheduling of routine cardiac CTs. Some insurance companies require a pre-authorization which may delay scheduling of this test.   For non-scheduling related questions, please contact the cardiac imaging nurse navigator should you have any questions/concerns: Marchia Bond, Cardiac Imaging Nurse Navigator Gordy Clement, Cardiac Imaging Nurse Navigator [facility] Direct Office Dial: [phone]   3. Your physician has recommended that you wear a Zio monitor for 7 days, this will be mailed to your home. This monitor is a medical device that records the hearts electrical activity. Doctors most often use these monitors to diagnose arrhythmias. Arrhythmias are problems with the speed or rhythm of the heartbeat. The monitor is a small device applied to your chest. You can wear one while you do your normal daily activities. While wearing this monitor if you have any symptoms to push the button and record what you felt. Once you have worn  this monitor for the period of time provider prescribed (Usually 14 days), you will return the monitor device in the postage paid box. Once it is returned they will download the data collected and provide [place] with a report which the provider will then review and we will call you with those results. Important  tips:  Avoid showering during the first 24 hours of wearing the monitor. Avoid excessive sweating to help maximize wear time. Do not submerge the device, no hot tubs, and no swimming pools. Keep any lotions or oils away from the patch. After 24 hours you may shower with the patch on. Take brief showers with your back facing the shower head.  Do not remove patch once it has been placed because that will interrupt data and decrease adhesive wear time. Push the button when you have any symptoms and write down what you were feeling. Once you have completed wearing your monitor, remove and place into box which has postage paid and place in your outgoing mailbox.  If for some reason you have misplaced your box then call our office and we can provide another box and/or mail it off for you.       For scheduling needs, including cancellations and rescheduling, please call [place], [phone].    Follow-Up: At [facility], you and your health needs are our priority.  As part of our continuing mission to provide you with exceptional heart care, we have created designated Provider Care Teams.  These Care Teams include your primary Cardiologist (physician) and Advanced Practice Providers (APPs -  Physician Assistants and Nurse Practitioners) who all work together to provide you with the care you need, when you need it.  We recommend signing up for the patient portal called "MyChart".  Sign up information is provided on this After Visit Summary.  MyChart is used to connect with patients for Virtual Visits (Telemedicine).  Patients are able to view lab/test results, encounter notes, upcoming appointments, etc.  Non-urgent messages can be sent to your provider as well.   To learn more about what you can do with MyChart, go to [email or web address].    Your next appointment:   6 week(s)  The format for your next appointment:   In Person  Provider:   Glenetta Hew, MD    Other  Instructions N/A

## 2021-12-26 NOTE — Progress Notes (Signed)
Primary Care Provider: [name], MD [facility] Cardiologist: None Electrophysiologist: None  Clinic Note: Chief Complaint  Patient presents with   NEW patient-SOB/fatigue/chest pain    Patient states chest pain radiates down her left arm and goes numb. She also notes she has a L torn bicep and is not sure if that is what is actually causing the pain or not.   Chest Pain   Palpitations   Shortness of Breath    Worse with exertion    ===================================  ASSESSMENT/PLAN   Problem List Items Addressed This Visit       Cardiology Problems   Essential hypertension - Primary (Chronic)    Blood pressure looks relatively good.  Diastolic pressure up a little bit.  Had previously been on lisinopril.  Now on losartan 50 mg daily.  She is also on Lopressor which was recently started.  We can reassess pressures on a routine follow-up.      Hyperlipidemia associated with type 2 diabetes mellitus (HCC) (Chronic)    No labs available.  She is on atorvastatin for hyperlipidemia.  She is on combination of glipizide, metformin and Rybelsus for diabetes.  Would need to get labs from PCPs office.  If not able to get them recheck labs when she is seen in follow-up.  BMP checked today.        Other   Palpitations    Intermittent tachycardic spells.  Sounds like she may be having atrial runs or PSVT/PAT.  Is also possibility that she have A. Fib.  Plan: 7-day Zio patch      Relevant Orders   LONG TERM MONITOR (3-14 DAYS)   Chest pain of uncertain etiology    Difficult to tell if her chest pain is anginal or not.  There is several different weird symptoms including left arm and shoulder pain.  She does have diabetes and hypertension as well as hyperlipidemia.  She is also former smoker.  Current guidelines would indicate that first-line diagnostic test for ischemic evaluation is Coronary CTA with possible FFRCT.  She is already on beta-blocker, statin and  ARB.  Pending results, would potentially consider adding aspirin and potentially further ischemic evaluation.      Relevant Orders   EKG 12-Lead (Completed)   LONG TERM MONITOR (3-14 DAYS)   Basic Metabolic Panel (BMET) (Completed)   Exercise intolerance   Heart murmur    She was told that her PCP heard a murmur.  She may have a soft systolic murmur but I did not necessarily hear much.  Her sounds are distant however, and with exertional dyspnea I would like to at least assess with an echocardiogram.      Relevant Orders   ECHOCARDIOGRAM COMPLETE   DOE (dyspnea on exertion)    This could be related to deconditioning and COPD, but an associate with chest pain, would like to do an ischemic evaluation as well.  Also there is concern for possible murmur although I did not hear much.  Plan: 2D echocardiogram and Coronary CTA (with possible FFRCT) for CAD/ischemic evaluation.      Asthma (Chronic)    On multiple medications for "asthma more likely COPD."  Would like to assess for any CHF component of her dyspnea.  Also would like to see any potential RV involvement suggesting pulmonary hypertension  Plan: 2D echo      Relevant Medications   beclomethasone (QVAR) 80 MCG/ACT inhaler   budesonide-formoterol (SYMBICORT) 160-4.5 MCG/ACT inhaler   Other Visit Diagnoses  Precordial pain       Relevant Orders   CT CORONARY MORPH W/CTA COR W/SCORE W/CA W/CM &/OR WO/CM       ===================================  HPI:    [name] is a 56 y.o. female who is being seen today for the evaluation of multiple different complaints: Possible Murmur, Chest Pain/Shortness of Breath.  She is being seen today at the request of Crystal Carls, MD) Dr. Rosario [name].  Unfortunately, no PCP note available.  [name] apparently was seen by her PCP, but she is not sure of the reason why.  She tells me that when they were doing her examination they mention something about a possible murmur.   However when I asked her about symptoms, she had multiple complaints.  Recent Hospitalizations: None recently  Reviewed  CV studies:    The following studies were reviewed today: (if available, images/films reviewed: From Epic Chart or Care Everywhere) Echo 09/29/2015: EF 55-60%.  No R WMA.  GR 1 DD.  Is also normal valves.  Normal study.  Interval History:   [name] is a relatively poor historian and has lots of different complaints.  Some of which have nothing to do from a cardiac standpoint including itching and burning on the top of her head where there is a soft spot.  When I asked her about cardiac symptoms she basically answers yes to most questions.  Zio is very difficult to determine what exactly is happening.  She notes that she gets easily fatigued with shortness of breath exertional dyspnea with anything more than routine activity.  Usually improves after resting for 5 minutes.  She does not really have orthopnea but cannot lie flat on her back.  She has to roll over on her side.  This is associate with mild end of day swelling and some mild peripheral neuropathy but no significant edema.  She describes a really strange symptom of left arm numbness and pain and tingling which really associate with her entire left side of her body.  It even goes down to her left arm.  But she says that she sometimes can even open up her left hand.  This usually happens at rest but sometimes happens when she is doing things.  Occasionally she also will notice a tightness that goes from her shoulder up into her neck and chest which may or may not be associated with that arm and hand pain/stiffness.  It is important to note that she has recently suffered a left bicep tear which could be part of the reason for her symptoms.  She also complains of intermittent heart racing spells where beats fast for little bit and then goes back to normal.  She can get lightheaded dizzy with it but has not had any  syncope or near syncope.  She thinks that may be the reason why her primary doctor started her on a new medicine that starts with a M (likely metoprolol).  CV Review of Symptoms (Summary) Cardiovascular ROS: positive for - chest pain, dyspnea on exertion, edema, palpitations, rapid heart rate, and dizziness and near syncope negative for - irregular heartbeat, loss of consciousness, orthopnea, paroxysmal nocturnal dyspnea, shortness of breath, or syncope/near syncope or TIA/amaurosis fugax, claudication  REVIEWED OF SYSTEMS   Review of Systems  Constitutional:  Positive for malaise/fatigue. Negative for chills, fever and weight loss.  HENT:  Negative for congestion and nosebleeds.   Eyes:  Positive for blurred vision (Associated with headache and nausea.).  Respiratory:  Positive for cough and shortness of breath.   Cardiovascular:        Per HPI  Gastrointestinal:  Positive for abdominal pain. Negative for blood in stool and melena.  Genitourinary:  Negative for dysuria, frequency and hematuria.  Musculoskeletal:        Occasional episodes of left hand cramping or tightness where she cannot open up her hand.  Neurological:  Positive for dizziness and headaches (At least once if not once every other day.). Negative for focal weakness.  Psychiatric/Behavioral:  Positive for depression (Seems to be stable). Negative for memory loss. The patient is nervous/anxious and has insomnia.    I have reviewed and (if needed) personally updated the patient's problem list, medications, allergies, past medical and surgical history, social and family history.   PAST MEDICAL HISTORY   Past Medical History:  Diagnosis Date   Acute diverticulitis    Acute pain of right knee 10/14/2016   Acute renal failure (ARF) ([place]) 01/26/2019   Chronic back pain    Complicated by neuropathy. ->  On Neurontin and duloxetine along with Flexeril and Voltaren gel.  Also uses PRN tramadol.   COPD with asthma ([place])    On  combination of albuterol Qvar and Symbicort   Depression    Diabetes mellitus without complication ([place])    Diverticulitis large intestine 05/12/2020   Hypertension     PAST SURGICAL HISTORY   Past Surgical History:  Procedure Laterality Date   ABDOMINAL HYSTERECTOMY     COLONOSCOPY WITH PROPOFOL N/A 10/02/2021   Procedure: COLONOSCOPY WITH PROPOFOL;  Surgeon: Lin Landsman, MD;  Location: [facility] ENDOSCOPY;  Service: Gastroenterology;  Laterality: N/A;   ESOPHAGOGASTRODUODENOSCOPY N/A 10/02/2021   Procedure: ESOPHAGOGASTRODUODENOSCOPY (EGD);  Surgeon: Lin Landsman, MD;  Location: [facility] ENDOSCOPY;  Service: Gastroenterology;  Laterality: N/A;   TRANSTHORACIC ECHOCARDIOGRAM  09/29/2015   EF 55-60%.  No R WMA.  GR 1 DD.  Is also normal valves.  Normal study.   TUBAL LIGATION      Immunization History  Administered Date(s) Administered   Influenza,inj,Quad PF,6+ Mos 09/29/2015   Moderna Sars-Covid-2 Vaccination 03/19/2021, 04/18/2021   Pneumococcal Polysaccharide-23 09/29/2015    MEDICATIONS/ALLERGIES   Current Meds  Medication Sig   acetaminophen (TYLENOL) 325 MG tablet Take 2 tablets (650 mg total) by mouth every 6 (six) hours as needed for mild pain or fever.   albuterol (PROVENTIL HFA;VENTOLIN HFA) 108 (90 Base) MCG/ACT inhaler Inhale 1-2 puffs into the lungs every 6 (six) hours as needed for wheezing or shortness of breath.   albuterol (PROVENTIL) (2.5 MG/3ML) 0.083% nebulizer solution Take 3 mLs (2.5 mg total) by nebulization every 2 (two) hours as needed for wheezing or shortness of breath.   aspirin EC 81 MG tablet Take 81 mg by mouth daily.   atorvastatin (LIPITOR) 10 MG tablet Take 10 mg by mouth daily.   beclomethasone (QVAR) 80 MCG/ACT inhaler Inhale 1 puff into the lungs daily.   budesonide-formoterol (SYMBICORT) 160-4.5 MCG/ACT inhaler Inhale 1 puff into the lungs 2 (two) times daily.   Cholecalciferol (VITAMIN D3) 1.25 MG (50000 UT) CAPS Take 1 capsule by  mouth once a week.   cyclobenzaprine (FLEXERIL) 5 MG tablet TAKE 1 TABLET BY MOUTH NIGHTLY AS NEEDEDFOR MUSCLE SPASMS   diclofenac sodium (VOLTAREN) 1 % GEL Apply 2 g topically 4 (four) times daily as needed (pain).   DULoxetine (CYMBALTA) 20 MG capsule Take 20 mg by mouth daily.   gabapentin (NEURONTIN) 600 MG tablet Take by mouth  2 (two) times daily.   glipiZIDE (GLUCOTROL) 5 MG tablet Take 1 tablet (5 mg total) by mouth every morning.   hydrochlorothiazide (HYDRODIURIL) 25 MG tablet Take 25 mg by mouth daily.   linaclotide (LINZESS) 290 MCG CAPS capsule Take 1 capsule (290 mcg total) by mouth daily before breakfast.   loratadine (CLARITIN) 10 MG tablet Take 1 tablet (10 mg total) by mouth daily.   losartan (COZAAR) 50 MG tablet Take 1 tablet (50 mg total) by mouth daily.   metFORMIN (GLUCOPHAGE) 1000 MG tablet Take 1 tablet (1,000 mg total) by mouth 2 (two) times daily with a meal. See discharge instructions for how to take medicine at first   metoprolol tartrate (LOPRESSOR) 50 MG tablet Take 50 mg by mouth 2 (two) times daily.   omeprazole (PRILOSEC) 20 MG capsule Take 2 capsules (40 mg total) by mouth daily.   polyethylene glycol (MIRALAX / GLYCOLAX) 17 g packet Take 17 g by mouth 2 (two) times daily.   Semaglutide (RYBELSUS) 7 MG TABS Take by mouth daily.   sertraline (ZOLOFT) 50 MG tablet Take 1.5 tablets (75 mg total) by mouth every morning.   traMADol (ULTRAM) 50 MG tablet Take by mouth every 6 (six) hours as needed.  Metoprolol is relatively new  No Known Allergies  SOCIAL HISTORY/FAMILY HISTORY   Reviewed in Epic:   Social History   Tobacco Use   Smoking status: Former    Packs/day: 3.00    Years: 10.00    Pack years: 30.00    Types: Cigarettes    Quit date: 12/22/2004    Years since quitting: 17.0   Smokeless tobacco: Never  Vaping Use   Vaping Use: Never used  Substance Use Topics   Alcohol use: No   Drug use: No   Social History   Social History Narrative     Pulmonary (07/20/17):   No bird or mold exposure. No recent travel.    Family History  Problem Relation Age of Onset   Hypertension Mother    Other Other   Unaware of any premature CAD history.  OBJCTIVE -PE, EKG, labs   Wt Readings from Last 3 Encounters:  12/26/21 154 lb (69.9 kg)  11/25/21 156 lb (70.8 kg)  11/21/21 157 lb (71.2 kg)    Physical Exam: BP (!) 130/100 Comment: unable to obtain-torn bicep   Pulse 64    Ht 5\' 1"  (1.549 m)    Wt 154 lb (69.9 kg)    SpO2 99%    BMI 29.10 kg/m  Physical Exam Vitals reviewed.  Constitutional:      Appearance: Normal appearance. She is obese.     Comments: Relatively healthy appearing, borderline obese.  Well-groomed.  Well-nourished.  HENT:     Head: Normocephalic and atraumatic.  Neck:     Vascular: No carotid bruit.  Cardiovascular:     Rate and Rhythm: Normal rate and regular rhythm.     Pulses: Normal pulses.     Heart sounds: S1 normal and S2 normal. Heart sounds are distant. Murmur (cannot rule out soft 1/6 SEM) heard.    No friction rub. No gallop.  Pulmonary:     Effort: Pulmonary effort is normal. No respiratory distress.     Breath sounds: Normal breath sounds. No wheezing, rhonchi or rales.  Chest:     Chest wall: No tenderness.  Abdominal:     General: Abdomen is flat. Bowel sounds are normal. There is no distension.     Palpations: Abdomen  is soft. There is no mass (no HSM, Bruit).     Tenderness: There is no abdominal tenderness.  Musculoskeletal:     Cervical back: Normal range of motion and neck supple.  Neurological:     General: No focal deficit present.     Mental Status: She is alert and oriented to person, place, and time.     Motor: Weakness (L hand grip 3/5) present.  Psychiatric:        Behavior: Behavior normal.        Thought Content: Thought content normal.        Judgment: Judgment normal.     Comments: Seems down & anxious    Adult ECG Report  Rate: 64 ;  Rhythm: normal sinus  rhythm and LVH. Poor R Wave Progression ;   Narrative Interpretation: Borderline EKG  Recent Labs:  No labs available  Lab Results  Component Value Date   CHOL 189 09/29/2015   HDL 47 09/29/2015   LDLCALC 121 (H) 09/29/2015   TRIG 107 09/29/2015   CHOLHDL 4.0 09/29/2015   Lab Results  Component Value Date   CREATININE 0.91 12/26/2021   BUN 9 12/26/2021   NA 140 12/26/2021   K 4.2 12/26/2021   CL 103 12/26/2021   CO2 25 12/26/2021   CBC Latest Ref Rng & Units 09/17/2021 05/13/2020 05/12/2020  WBC 3.4 - 10.8 x10E3/uL 5.9 7.9 13.0(H)  Hemoglobin 11.1 - 15.9 g/dL 14.7 12.1 12.4  Hematocrit 34.0 - 46.6 % 44.4 37.4 38.4  Platelets 150 - 450 x10E3/uL 251 213 215    Lab Results  Component Value Date   HGBA1C 7.8 (H) 09/17/2021   Lab Results  Component Value Date   TSH 1.060 09/17/2021    ==================================================  COVID-19 Education: The signs and symptoms of COVID-19 were discussed with the patient and how to seek care for testing (follow up with PCP or arrange E-visit).    I spent a total of 38  minutes with the patient spent in direct patient consultation.  Additional time spent with chart review  / charting (studies, outside notes, etc): 24 min Total Time: 62 min  Current medicines are reviewed at length with the patient today.  (+/- concerns) n/a  This visit occurred during the SARS-CoV-2 public health emergency.  Safety protocols were in place, including screening questions prior to the visit, additional usage of staff PPE, and extensive cleaning of exam room while observing appropriate contact time as indicated for disinfecting solutions.  Notice: This dictation was prepared with Dragon dictation along with smart phrase technology. Any transcriptional errors that result from this process are unintentional and may not be corrected upon review.   Studies Ordered:  Orders Placed This Encounter  Procedures   CT CORONARY MORPH W/CTA COR W/SCORE  W/CA W/CM &/OR WO/CM   Basic Metabolic Panel (BMET)   LONG TERM MONITOR (3-14 DAYS)   EKG 12-Lead   ECHOCARDIOGRAM COMPLETE    Patient Instructions / Medication Changes & Studies & Tests Ordered   Patient Instructions  Medication Instructions:  Your physician recommends that you continue on your current medications as directed. Please refer to the Current Medication list given to you today.   *If you need a refill on your cardiac medications before your next appointment, please call your pharmacy*   Lab Work: We will draw labs today (BMP) prior to you leaving our office.   If you have labs (blood work) drawn today and your tests are completely normal, you will  receive your results only by: [place] (if you have MyChart) OR A paper copy in the mail If you have any lab test that is abnormal or we need to change your treatment, we will call you to review the results.   Testing/Procedures: Echocardiogram - Your physician has requested that you have an echocardiogram in 2-3 weeks. Echocardiography is a painless test that uses sound waves to create images of your heart. It provides your doctor with information about the size and shape of your heart and how well your hearts chambers and valves are working. This procedure takes approximately one hour. There are no restrictions for this procedure.   2. Your cardiac CT will be scheduled at:  [facility] [address] [place], [place] [address] [phone]  Please arrive 15 mins early for check-in and test prep.   3. Your physician has recommended that you wear a Zio monitor for 7 days, this will be mailed to your home.   Follow-Up: At [facility], you and your health needs are our priority.  As part of our continuing mission to provide you with exceptional heart care, we have created designated Provider Care Teams.  These Care Teams include your primary Cardiologist (physician)  and Advanced Practice Providers (APPs -  Physician Assistants and Nurse Practitioners) who all work together to provide you with the care you need, when you need it.  We recommend signing up for the patient portal called "MyChart".  Sign up information is provided on this After Visit Summary.  MyChart is used to connect with patients for Virtual Visits (Telemedicine).  Patients are able to view lab/test results, encounter notes, upcoming appointments, etc.  Non-urgent messages can be sent to your provider as well.   To learn more about what you can do with MyChart, go to [email or web address].    Your next appointment:   6 week(s)  The format for your next appointment:   In Person  Provider:   Glenetta Hew, MD    Other Instructions N/A     Glenetta Hew, M.D., M.S. Interventional Cardiologist   Pager # [phone] Phone # [phone] [address] [place], [place] [address]   Thank you for choosing [facility] in [place]!!

## 2021-12-27 NOTE — Telephone Encounter (Signed)
Patient is scheduled for 02/27/22 with [facility]

## 2021-12-28 ENCOUNTER — Encounter: Payer: Self-pay | Admitting: Cardiology

## 2021-12-28 LAB — BASIC METABOLIC PANEL
BUN/Creatinine Ratio: 10 (ref 9–23)
BUN: 9 mg/dL (ref 6–24)
CO2: 25 mmol/L (ref 20–29)
Calcium: 10.3 mg/dL — ABNORMAL HIGH (ref 8.7–10.2)
Chloride: 103 mmol/L (ref 96–106)
Creatinine, Ser: 0.91 mg/dL (ref 0.57–1.00)
Glucose: 216 mg/dL — ABNORMAL HIGH (ref 70–99)
Potassium: 4.2 mmol/L (ref 3.5–5.2)
Sodium: 140 mmol/L (ref 134–144)
eGFR: 75 mL/min/{1.73_m2} (ref >59)

## 2021-12-28 NOTE — Assessment & Plan Note (Signed)
On multiple medications for "asthma more likely COPD."  Would like to assess for any CHF component of her dyspnea.  Also would like to see any potential RV involvement suggesting pulmonary hypertension  Plan: 2D echo

## 2021-12-28 NOTE — Assessment & Plan Note (Signed)
Intermittent tachycardic spells.  Sounds like she may be having atrial runs or PSVT/PAT.  Is also possibility that she have A. Fib.  Plan: 7-day Zio patch

## 2021-12-28 NOTE — Assessment & Plan Note (Signed)
Difficult to tell if her chest pain is anginal or not.  There is several different weird symptoms including left arm and shoulder pain.  She does have diabetes and hypertension as well as hyperlipidemia.  She is also former smoker.  Current guidelines would indicate that first-line diagnostic test for ischemic evaluation is Coronary CTA with possible FFRCT.  She is already on beta-blocker, statin and ARB.  Pending results, would potentially consider adding aspirin and potentially further ischemic evaluation.

## 2021-12-28 NOTE — Assessment & Plan Note (Addendum)
Blood pressure looks relatively good.  Diastolic pressure up a little bit.  Had previously been on lisinopril.  Now on losartan 50 mg daily.  She is also on Lopressor which was recently started.  We can reassess pressures on a routine follow-up.

## 2021-12-28 NOTE — Assessment & Plan Note (Addendum)
This could be related to deconditioning and COPD, but an associate with chest pain, would like to do an ischemic evaluation as well.  Also there is concern for possible murmur although I did not hear much.  Plan: 2D echocardiogram and Coronary CTA (with possible FFRCT) for CAD/ischemic evaluation.

## 2021-12-28 NOTE — Assessment & Plan Note (Signed)
No labs available.  She is on atorvastatin for hyperlipidemia.  She is on combination of glipizide, metformin and Rybelsus for diabetes.  Would need to get labs from PCPs office.  If not able to get them recheck labs when she is seen in follow-up.  BMP checked today.

## 2021-12-28 NOTE — Assessment & Plan Note (Signed)
She was told that her PCP heard a murmur.  She may have a soft systolic murmur but I did not necessarily hear much.  Her sounds are distant however, and with exertional dyspnea I would like to at least assess with an echocardiogram.

## 2022-01-01 ENCOUNTER — Ambulatory Visit
Admission: RE | Admit: 2022-01-01 | Discharge: 2022-01-01 | Disposition: A | Discharge status: Home / Self Care | Payer: 59 | Source: Ambulatory Visit | Attending: Pulmonary Disease | Admitting: Pulmonary Disease

## 2022-01-01 ENCOUNTER — Other Ambulatory Visit: Payer: Self-pay

## 2022-01-01 ENCOUNTER — Other Ambulatory Visit
Admission: RE | Admit: 2022-01-01 | Discharge: 2022-01-01 | Disposition: A | Discharge status: Home / Self Care | Payer: 59 | Source: Ambulatory Visit | Attending: Pulmonary Disease | Admitting: Pulmonary Disease

## 2022-01-01 ENCOUNTER — Encounter: Payer: Self-pay | Admitting: Pulmonary Disease

## 2022-01-01 ENCOUNTER — Telehealth (HOSPITAL_COMMUNITY): Payer: Self-pay | Admitting: Emergency Medicine

## 2022-01-01 ENCOUNTER — Ambulatory Visit (INDEPENDENT_AMBULATORY_CARE_PROVIDER_SITE_OTHER): Payer: 59 | Admitting: Pulmonary Disease

## 2022-01-01 VITALS — BP 160/110 | HR 96 | Temp 98.0°F | Ht 61.5 in | Wt 156.4 lb

## 2022-01-01 DIAGNOSIS — Z20822 Contact with and (suspected) exposure to covid-19: Secondary | ICD-10-CM | POA: Insufficient documentation

## 2022-01-01 DIAGNOSIS — I10 Essential (primary) hypertension: Secondary | ICD-10-CM | POA: Diagnosis not present

## 2022-01-01 DIAGNOSIS — J45909 Unspecified asthma, uncomplicated: Secondary | ICD-10-CM | POA: Diagnosis not present

## 2022-01-01 DIAGNOSIS — R002 Palpitations: Secondary | ICD-10-CM

## 2022-01-01 DIAGNOSIS — R0602 Shortness of breath: Secondary | ICD-10-CM | POA: Diagnosis not present

## 2022-01-01 DIAGNOSIS — R079 Chest pain, unspecified: Secondary | ICD-10-CM

## 2022-01-01 DIAGNOSIS — K219 Gastro-esophageal reflux disease without esophagitis: Secondary | ICD-10-CM

## 2022-01-01 LAB — SARS CORONAVIRUS 2 (TAT 6-24 HRS): SARS Coronavirus 2: NEGATIVE

## 2022-01-01 MED ORDER — BUDESONIDE-FORMOTEROL FUMARATE 160-4.5 MCG/ACT IN AERO: 2.0000 | INHALATION_SPRAY | Freq: Two times a day (BID) | RESPIRATORY_TRACT | 12 refills | Status: AC

## 2022-01-01 MED ORDER — OMEPRAZOLE 40 MG PO CPDR: 40.0000 mg | DELAYED_RELEASE_CAPSULE | Freq: Every day | ORAL | 2 refills | Status: AC

## 2022-01-01 MED ORDER — ALBUTEROL SULFATE HFA 108 (90 BASE) MCG/ACT IN AERS: 2.0000 | INHALATION_SPRAY | Freq: Four times a day (QID) | RESPIRATORY_TRACT | 2 refills | Status: AC | PRN

## 2022-01-01 NOTE — Patient Instructions (Signed)
We are going to get some breathing tests.  We have ordered a chest x-ray to check on the issue of your chest pain.  I have written new prescriptions for your inhalers 1 inhaler is 2 puffs twice a day this is the Symbicort.  This is your maintenance inhaler (daily inhaler).  Make sure you rinse your mouth well after you use it.  The other inhaler is emergency inhaler and is called albuterol it may be under the brand name of Ventolin or Proventil or ProAir.  You can use 2 puffs up to 4 times a day if needed for shortness of breath.  We will see you in follow-up in 2 months time call sooner should any problems arise.

## 2022-01-01 NOTE — Telephone Encounter (Signed)
Reaching out to patient to offer assistance regarding upcoming cardiac imaging study; pt verbalizes understanding of appt date/time, parking situation and where to check in, pre-test NPO status and medications ordered, and verified current allergies; name and call back number provided for further questions should they arise Marchia Bond RN Navigator Cardiac Imaging [place] [facility] [phone] office [phone] cell  100mg  metoprolol tart 2 hr prior to scan Arrival 1:45pm

## 2022-01-01 NOTE — Progress Notes (Signed)
Subjective:    Patient ID: [name], female    DOB: [birth year], 56 y.o.   MRN: [id number] Chief Complaint  Patient presents with   Consult    Asthma, SOB   HPI Patient is a 56 year old former smoker (quit 2006, 30 PY) who presents for evaluation of dyspnea of months/years duration.  She is kindly referred by Dr. Yvette Rack.  Patient carries a diagnosis of asthma.  Last evaluated by pulmonology in 2018 during an admission at [facility].  Time it was noted that she had had multiple admissions with acute onset of shortness of breath and chest tightness.  She had poor control of asthma.  She was subsequently seen at the [facility] clinic in [place] in follow-up August 2018 and then got lost to follow-up.  She was placed on Symbicort 160/4.5, 2 inhalations twice a day and as needed albuterol.  She has not had refills of those medications in "years".  She presents today with an empty Symbicort inhaler and well-worn empty Qvar inhaler.  She does not have an albuterol inhaler currently.  The patient notes that she has been having some left sided chest pain for approximately 6 to 7 months she also notes that this chest pain is worse when she is laying supine or on the left side.  This pain is relieved with tramadol and gabapentin.  She also notes orthopnea as well.  She has complaint of early satiety and slow gastric emptying.  She is diabetic.  She does note that her diabetic control has been erratic.  He has neuropathy and chronic headaches.  She endorses reflux symptoms.  She has had no cough, sputum production, nor hemoptysis.  She had COVID-19 in 2021, did not require hospitalization.  No sequelae.  She states that she lives by herself and works as a Building control surveyor.  She does not endorse any other history.  DATA 06/08/2017 PFTs: Noted patient gave marginal effort according to technician.  FEV1 1.82 L or 88% predicted, FVC 2.23 L or 87% predicted, FEV1/FVC 81%, no  bronchodilator response lung volumes showed air trapping with no hyperinflation diffusion capacity mildly reduced.  Minimal obstructive airways disease with minimal diffusion defect, no bronchodilator response.  Review of Systems A 10 point review of systems was performed and it is as noted above.  On her review of systems form she marked every single symptom positive.  Past Medical History:  Diagnosis Date   Acute diverticulitis    Acute pain of right knee 10/14/2016   Acute renal failure (ARF) ([place]) 01/26/2019   Chronic back pain    Complicated by neuropathy. ->  On Neurontin and duloxetine along with Flexeril and Voltaren gel.  Also uses PRN tramadol.   COPD with asthma ([place])    On combination of albuterol Qvar and Symbicort   Depression    Diabetes mellitus without complication ([place])    Diverticulitis large intestine 05/12/2020   Hypertension    Past Surgical History:  Procedure Laterality Date   ABDOMINAL HYSTERECTOMY     COLONOSCOPY WITH PROPOFOL N/A 10/02/2021   Procedure: COLONOSCOPY WITH PROPOFOL;  Surgeon: Lin Landsman, MD;  Location: [facility] ENDOSCOPY;  Service: Gastroenterology;  Laterality: N/A;   ESOPHAGOGASTRODUODENOSCOPY N/A 10/02/2021   Procedure: ESOPHAGOGASTRODUODENOSCOPY (EGD);  Surgeon: Lin Landsman, MD;  Location: [facility] ENDOSCOPY;  Service: Gastroenterology;  Laterality: N/A;   TRANSTHORACIC ECHOCARDIOGRAM  09/29/2015   EF 55-60%.  No R WMA.  GR 1 DD.  Is also normal valves.  Normal  study.   TUBAL LIGATION     Family History  Problem Relation Age of Onset   Hypertension Mother    Other Other    Social History   Tobacco Use   Smoking status: Former    Packs/day: 3.00    Years: 10.00    Pack years: 30.00    Types: Cigarettes    Quit date: 12/22/2004    Years since quitting: 17.0   Smokeless tobacco: Never  Substance Use Topics   Alcohol use: No   No Known Allergies Current Meds  Medication Sig   acetaminophen (TYLENOL) 325 MG tablet Take  2 tablets (650 mg total) by mouth every 6 (six) hours as needed for mild pain or fever.   albuterol (PROVENTIL HFA;VENTOLIN HFA) 108 (90 Base) MCG/ACT inhaler Inhale 1-2 puffs into the lungs every 6 (six) hours as needed for wheezing or shortness of breath.   albuterol (PROVENTIL) (2.5 MG/3ML) 0.083% nebulizer solution Take 3 mLs (2.5 mg total) by nebulization every 2 (two) hours as needed for wheezing or shortness of breath.   aspirin EC 81 MG tablet Take 81 mg by mouth daily.   atorvastatin (LIPITOR) 10 MG tablet Take 10 mg by mouth daily.   beclomethasone (QVAR) 80 MCG/ACT inhaler Inhale 1 puff into the lungs daily.   budesonide-formoterol (SYMBICORT) 160-4.5 MCG/ACT inhaler Inhale 1 puff into the lungs 2 (two) times daily.   Cholecalciferol (VITAMIN D3) 1.25 MG (50000 UT) CAPS Take 1 capsule by mouth once a week.   cyclobenzaprine (FLEXERIL) 5 MG tablet TAKE 1 TABLET BY MOUTH NIGHTLY AS NEEDEDFOR MUSCLE SPASMS   diclofenac sodium (VOLTAREN) 1 % GEL Apply 2 g topically 4 (four) times daily as needed (pain).   DULoxetine (CYMBALTA) 20 MG capsule Take 20 mg by mouth daily.   gabapentin (NEURONTIN) 600 MG tablet Take by mouth 2 (two) times daily.   glipiZIDE (GLUCOTROL) 5 MG tablet Take 1 tablet (5 mg total) by mouth every morning.   hydrochlorothiazide (HYDRODIURIL) 25 MG tablet Take 25 mg by mouth daily.   linaclotide (LINZESS) 290 MCG CAPS capsule Take 1 capsule (290 mcg total) by mouth daily before breakfast.   loratadine (CLARITIN) 10 MG tablet Take 1 tablet (10 mg total) by mouth daily.   losartan (COZAAR) 50 MG tablet Take 1 tablet (50 mg total) by mouth daily.   metFORMIN (GLUCOPHAGE) 1000 MG tablet Take 1 tablet (1,000 mg total) by mouth 2 (two) times daily with a meal. See discharge instructions for how to take medicine at first   metoprolol tartrate (LOPRESSOR) 50 MG tablet Take 50 mg by mouth 2 (two) times daily.   omeprazole (PRILOSEC) 20 MG capsule Take 2 capsules (40 mg total) by  mouth daily.   polyethylene glycol (MIRALAX / GLYCOLAX) 17 g packet Take 17 g by mouth 2 (two) times daily.   Semaglutide (RYBELSUS) 7 MG TABS Take by mouth daily.   sertraline (ZOLOFT) 50 MG tablet Take 1.5 tablets (75 mg total) by mouth every morning.   traMADol (ULTRAM) 50 MG tablet Take by mouth every 6 (six) hours as needed.   Immunization History  Administered Date(s) Administered   Influenza,inj,Quad PF,6+ Mos 09/29/2015   Moderna Sars-Covid-2 Vaccination 03/19/2021, 04/18/2021   Pneumococcal Polysaccharide-23 09/29/2015       Objective:   Physical Exam BP (!) 160/110 (BP Location: Left Arm, Patient Position: Sitting, Cuff Size: Normal)    Pulse 96    Temp 98 F (36.7 C) (Oral)    Ht 5'  1.5" (1.562 m)    Wt 156 lb 6.4 oz (70.9 kg)    SpO2 100%    BMI 29.07 kg/m  GENERAL: Overweight woman, no acute distress, fully ambulatory, no conversational dyspnea. HEAD: Normocephalic, atraumatic.  EYES: Pupils equal, round, reactive to light.  No scleral icterus.  MOUTH: Nose/mouth/throat not examined due to masking requirements for COVID 19. NECK: Supple. No thyromegaly. Trachea midline. No JVD.  No adenopathy. PULMONARY: Good air entry bilaterally.  No adventitious sounds. CARDIOVASCULAR: S1 and S2. Regular rate and rhythm.  No rubs, murmurs or gallops heard. ABDOMEN: Benign. MUSCULOSKELETAL: No joint deformity, no clubbing, no edema.  NEUROLOGIC: No focal deficit, no gait disturbance, speech is fluent. SKIN: Intact,warm,dry. PSYCH: Anxious mood, speech pressured.     Assessment & Plan:     ICD-10-CM   1. Shortness of breath  R06.02 DG Chest 2 View    Pulmonary Function Test [facility] Only   Reassess with PFTs Check chest x-ray Suspect poorly controlled asthma    2. Persistent asthma without complication, unspecified asthma severity  J45.909    Reassess with PFTs Resume Symbicort 160/4.5, 2 inhalations twice a day Albuterol as needed    3. Chest pain, unspecified type  R07.9     Being evaluated by cardiology She is to begin long-term monitor today Has 2D echo scheduled for 19 January    4. Essential hypertension  I10    Appears poorly controlled Defer to primary physician and cardiology To see her primary physician today May aggravate shortness of breath    5. Gastroesophageal reflux disease without esophagitis  K21.9    Query also element of gastroparesis Advised patient to discuss with primary physician Increase omeprazole to 40 mg daily     Orders Placed This Encounter  Procedures   DG Chest 2 View    Standing Status:   Future    Number of Occurrences:   1    Standing Expiration Date:   01/01/2023    Order Specific Question:   Reason for Exam (SYMPTOM  OR DIAGNOSIS REQUIRED)    Answer:   Shortness of breath    Order Specific Question:   Preferred imaging location?    Answer:   [facility]    Order Specific Question:   Is patient pregnant?    Answer:   No   Pulmonary Function Test [facility] Only    Standing Status:   Future    Standing Expiration Date:   01/01/2023    Order Specific Question:   Full PFT: includes the following: basic spirometry, spirometry pre & post bronchodilator, diffusion capacity (DLCO), lung volumes    Answer:   Full PFT    Order Specific Question:   This test can only be performed at    Answer:   [facility] ordered this encounter  Medications   budesonide-formoterol (SYMBICORT) 160-4.5 MCG/ACT inhaler    Sig: Inhale 2 puffs into the lungs 2 (two) times daily.    Dispense:  1 each    Refill:  12   albuterol (VENTOLIN HFA) 108 (90 Base) MCG/ACT inhaler    Sig: Inhale 2 puffs into the lungs every 6 (six) hours as needed for wheezing or shortness of breath.    Dispense:  8 g    Refill:  2   omeprazole (PRILOSEC) 40 MG capsule    Sig: Take 1 capsule (40 mg total) by mouth daily.    Dispense:  30 capsule    Refill:  2  It is difficult to discern the severity of the patient's symptoms that she appears to  have a pan positive review of systems.  Major issues identified as noted above.  We will resume her Symbicort and as needed albuterol prescriptions.  She does not need to use Qvar as Symbicort does have ICS.  Await results of testing above.  We will see her in follow-up in 2 months time she is to contact us prior to that time should any new difficulties arise.   Renold Don, MD Advanced Bronchoscopy PCCM [facility]    *This note was dictated using voice recognition software/Dragon.  Despite best efforts to proofread, errors can occur which can change the meaning. Any transcriptional errors that result from this process are unintentional and may not be fully corrected at the time of dictation.

## 2022-01-02 ENCOUNTER — Ambulatory Visit
Admission: RE | Admit: 2022-01-02 | Discharge: 2022-01-02 | Disposition: A | Discharge status: Home / Self Care | Payer: 59 | Source: Ambulatory Visit | Attending: Cardiology | Admitting: Cardiology

## 2022-01-02 ENCOUNTER — Telehealth: Payer: Self-pay | Admitting: Pulmonary Disease

## 2022-01-02 ENCOUNTER — Other Ambulatory Visit (HOSPITAL_COMMUNITY): Payer: Self-pay

## 2022-01-02 ENCOUNTER — Other Ambulatory Visit: Payer: Self-pay

## 2022-01-02 ENCOUNTER — Ambulatory Visit (HOSPITAL_BASED_OUTPATIENT_CLINIC_OR_DEPARTMENT_OTHER): Payer: 59

## 2022-01-02 DIAGNOSIS — R072 Precordial pain: Secondary | ICD-10-CM | POA: Insufficient documentation

## 2022-01-02 DIAGNOSIS — R0602 Shortness of breath: Secondary | ICD-10-CM

## 2022-01-02 MED ORDER — NITROGLYCERIN 0.4 MG SL SUBL
0.8000 mg | SUBLINGUAL_TABLET | Freq: Once | SUBLINGUAL | Status: AC
  Administered 2022-01-02: 0.8 mg via SUBLINGUAL

## 2022-01-02 MED ORDER — IOHEXOL 350 MG/ML SOLN: 100.0000 mL | Freq: Once | INTRAVENOUS | Status: DC | PRN

## 2022-01-02 MED ORDER — ALBUTEROL SULFATE (2.5 MG/3ML) 0.083% IN NEBU
2.5000 mg | INHALATION_SOLUTION | Freq: Once | RESPIRATORY_TRACT | Status: AC
  Administered 2022-01-02: 2.5 mg via RESPIRATORY_TRACT
  Filled 2022-01-02: qty 3

## 2022-01-02 MED ORDER — IOHEXOL 350 MG/ML SOLN
75.0000 mL | Freq: Once | INTRAVENOUS | Status: AC | PRN
  Administered 2022-01-02: 75 mL via INTRAVENOUS

## 2022-01-02 NOTE — Telephone Encounter (Signed)
Lm for patient.  

## 2022-01-02 NOTE — Progress Notes (Signed)
Patient tolerated CT well. Drank water after. Vital signs stable encourage to drink water throughout day.Reasons explained and verbalized understanding. Ambulated steady gait.  

## 2022-01-02 NOTE — Telephone Encounter (Signed)
Lets try Advair Diskus 250/50 1 inhalation twice a day.

## 2022-01-02 NOTE — Telephone Encounter (Signed)
Received alternative request for Symbicort from [facility].  Coverage alternative is Advair Diskus with 95 dollar co pay.   Dr. Patsey Berthold, please advise. thanks

## 2022-01-03 HISTORY — PX: OTHER SURGICAL HISTORY: SHX169

## 2022-01-03 NOTE — Telephone Encounter (Signed)
Lm x2 for patient.  Will close encounter per office protocol.  Mailed letter to address on file.

## 2022-01-06 ENCOUNTER — Telehealth: Payer: Self-pay | Admitting: Cardiology

## 2022-01-06 ENCOUNTER — Telehealth: Payer: Self-pay

## 2022-01-06 MED ORDER — FLUTICASONE-SALMETEROL 250-50 MCG/ACT IN AEPB: 1.0000 | INHALATION_SPRAY | Freq: Two times a day (BID) | RESPIRATORY_TRACT | 2 refills | Status: DC

## 2022-01-06 NOTE — Telephone Encounter (Signed)
Leonie Man, MD  01/03/2022  5:14 PM EST     Coronary CTA angiogram shows relatively low coronary calcium score of 1.29.  Very low risk.  Minimal coronary artery disease plaque in the LAD only.  CAD RADS score 1. Aortic atherosclerosis confirmed     Good news!! Negative test.  No signs of significant coronary disease. Chest pain is probably not fom heart artery disease.Glenetta Hew, MD

## 2022-01-06 NOTE — Telephone Encounter (Signed)
[name] had tried to call her on the 12th.  [employer] said that the alternative was Advair discus therefore I recommended Advair Diskus 250/50 1 inhalation twice a day.  She just never answered her call so that we could tell her about the change.

## 2022-01-06 NOTE — Telephone Encounter (Signed)
Attempted to contact the patient. No answer- I left a detailed message of results on her identified voice mail (ok per DPR). I asked that she call back with any further questions/ concerns. I asked that she keep her echo appointment scheduled for 1/19 at 4:00 pm.

## 2022-01-06 NOTE — Telephone Encounter (Signed)
Pts copay for Symbicort is $244.03 is there anything else we can offer her?

## 2022-01-06 NOTE — Telephone Encounter (Signed)
Advair called into patients pharmacy, nothing further needed. Called and spoke to [place] at [facility] to cancel the order for Symbicort and Advair has been sent in. Nothing further needed.

## 2022-01-09 ENCOUNTER — Other Ambulatory Visit: Payer: Self-pay

## 2022-01-09 ENCOUNTER — Ambulatory Visit (INDEPENDENT_AMBULATORY_CARE_PROVIDER_SITE_OTHER): Payer: 59

## 2022-01-09 DIAGNOSIS — R011 Cardiac murmur, unspecified: Secondary | ICD-10-CM | POA: Diagnosis not present

## 2022-01-09 HISTORY — PX: TRANSTHORACIC ECHOCARDIOGRAM: SHX275

## 2022-01-09 LAB — ECHOCARDIOGRAM COMPLETE
AR max vel: 1.88 cm2
AV Area VTI: 1.95 cm2
AV Area mean vel: 1.94 cm2
AV Mean grad: 5 mmHg
AV Peak grad: 10.2 mmHg
Ao pk vel: 1.6 m/s
Area-P 1/2: 2.95 cm2
S' Lateral: 2.9 cm
Single Plane A4C EF: 55.7 %

## 2022-01-10 ENCOUNTER — Telehealth: Payer: Self-pay | Admitting: Emergency Medicine

## 2022-01-10 NOTE — Telephone Encounter (Signed)
-----   Message from Lamar Laundry, RN sent at 01/10/2022 11:20 AM EST -----  ----- Message ----- From: Leonie Man, MD Sent: 01/10/2022   1:32 AM EST To: [name], MD, [facility] T[facility]  Echocardiogram shows normal pump function with ejection fraction of 60 to 65%.  Normal range being 50 to 70%. The left atrial muscle thickness does appear to be a little bit enlarged which goes along with mild diastolic dysfunction.  This is usually seen in situations of high blood pressure.    Left atrial size is normal at this would indicate blood pressure is not significant elevated.  This would suggest no evidence of heart failure. Right ventricle is normal.  Valves are normal.  Overall pretty normal study.  He just needs it we need to treat blood pressure closely.  Not much to suggest a reason for shortness of breath  Glenetta Hew, MD

## 2022-01-10 NOTE — Telephone Encounter (Signed)
Called patient to go over results of echo, phone number not working.   Will attempt again later.

## 2022-01-13 ENCOUNTER — Telehealth: Payer: Self-pay | Admitting: Emergency Medicine

## 2022-01-13 NOTE — Telephone Encounter (Signed)
Called pt to go over echo results. Pt voiced understanding.  Patient voiced appreciation for the call and understands that they can reach out to our office with any questions or concerns.

## 2022-01-13 NOTE — Telephone Encounter (Signed)
Entered in error. Please see phone encounter from 1/20.

## 2022-01-15 ENCOUNTER — Encounter: Payer: Self-pay | Admitting: Gastroenterology

## 2022-01-15 ENCOUNTER — Ambulatory Visit (INDEPENDENT_AMBULATORY_CARE_PROVIDER_SITE_OTHER): Payer: 59 | Admitting: Gastroenterology

## 2022-01-15 VITALS — BP 129/85 | HR 108 | Temp 97.5°F | Ht 61.5 in | Wt 159.2 lb

## 2022-01-15 DIAGNOSIS — K5904 Chronic idiopathic constipation: Secondary | ICD-10-CM

## 2022-01-15 NOTE — Progress Notes (Signed)
Cephas Darby, MD [address]  [place]  [place], [place] [address]  Main: [phone]  Fax: [phone]    Gastroenterology Consultation  Referring Provider:     [name], MD Primary Care Physician:  [name], MD Primary Gastroenterologist:  Dr. Cephas Darby Reason for Consultation:   Chronic idiopathic constipation       HPI:   [name] is a 56 y.o. female referred by Dr. [name], MD  for consultation & management of several months history of lower abdominal pain, abdominal bloating, irregular bowel habits.  Patient has poorly controlled diabetes with diabetic neuropathy, blurred vision, chronic fatigue, currently on metformin 1000 mg twice daily.  Patient has stopped working more than a year ago due to above symptoms, she used to be a caregiver.  Today, patient has been tearful and felt very low as she cannot get out of the house, she lives alone.  She says her whole body hurts and also she has biceps tear in her left hand, has appointment to see orthopedic.  She is on gabapentin 600 mg 3 times daily for neuropathic pain, and generalized pain.  She reports that she does not eat healthy, her blood sugars usually run around 300s.  Patient does not smoke or drink alcohol  Follow-up visit 10/16/2021 Patient is here for follow-up of chronic constipation.  Patient tried Linzess 290 MCG samples, which were helping.  Started on prescription.  Today, she reports that she has been having 3 watery bowel movements daily, in the morning, afternoon and before she goes to bed.  She reports that abdominal bloating is significantly improved along with lower abdominal cramps.  She is trying to follow a high-fiber diet, cut back on carbonated beverages.  She also lost few pounds.  Her hemoglobin A1c is also improved from 9.6 to 7.8.  Normal thyroid profile, no evidence of anemia.  LFTs normal.  Patient underwent colonoscopy which revealed 3 large polyps that were  resected and were benign, adenomatous with no evidence of dysplasia  Follow-up visit 01/15/2022 Patient is here for follow-up of chronic constipation.  She reports that she is doing well on Linzess 145 MCG daily.  She reports having formed bowel movement on Bristol stool scale 3 or 4.  She denies any GI symptoms.  She is taking omeprazole 40 mg daily for reflux.  She does report early satiety, able to have 1 meal a day only.  She is sad and tearful that her living conditions are unsafe, where she is living.  She is in a camper with poor heating.  She is working on moving to a better place to live.  She is compliant with her medications, she reports that her blood sugars have been in 200s.  She has an appointment with PCP on 1/31  NSAIDs: None  Antiplts/Anticoagulants/Anti thrombotics: None  GI Procedures:  EGD and colonoscopy 10/02/2021 - Normal duodenal bulb and second portion of the duodenum. - Erythematous mucosa in the gastric body and antrum. Biopsied. - Normal gastroesophageal junction and esophagus. - Esophagogastric landmarks identified.  - One 12 mm polyp in the cecum, removed with a hot snare. Resected and retrieved. Clips (MR conditional) were placed. - One 15 mm polyp in the transverse colon, removed with a hot snare. Resected and retrieved. Clip (MR conditional) was placed. - One 15 mm polyp in the descending colon, removed with a hot snare. Resected and retrieved. Clips (MR conditional) were placed. - Stool in the entire  examined colon. - Non-bleeding external hemorrhoids. - Diverticulosis in the sigmoid colon, in the descending colon and in the ascending colon.  DIAGNOSIS:  A. STOMACH, RANDOM; COLD BIOPSY:  - GASTRIC ANTRAL MUCOSA WITH CHRONIC INACTIVE GASTRITIS AND INTESTINAL  METAPLASIA.  - NEGATIVE FOR DYSPLASIA AND MALIGNANCY.   Comment:  Biopsy sections display antral mucosa with glandular atrophy, chronic  inflammation, and intestinal metaplasia.  The features  may potentially  be suggestive of atrophic gastritis.  An immunohistochemical study  directed against H. pylori is negative  B. COLON POLYP, CECUM; HOT SNARE:  - MULTIPLE FRAGMENTS OF TUBULAR ADENOMA.  - CAUTERIZED POLYP BASE APPEARS FREE OF DYSPLASIA.  - NEGATIVE FOR HIGH-GRADE DYSPLASIA AND MALIGNANCY.   C. COLON POLYP, TRANSVERSE; HOT SNARE:  - TUBULOVILLOUS ADENOMA.  - CAUTERIZED POLYP BASE APPEARS FREE OF DYSPLASIA.  - NEGATIVE FOR HIGH-GRADE DYSPLASIA AND MALIGNANCY.   D. COLON POLYP, DESCENDING; HOT SNARE:  - TUBULOVILLOUS ADENOMA.  - CAUTERIZED POLYP BASE APPEARS FREE OF DYSPLASIA.  - NEGATIVE FOR HIGH-GRADE DYSPLASIA AND MALIGNANCY.  She denies family history of GI malignancy  Past Medical History:  Diagnosis Date   Acute diverticulitis    Acute pain of right knee 10/14/2016   Acute renal failure (ARF) ([place]) 01/26/2019   Chronic back pain    Complicated by neuropathy. ->  On Neurontin and duloxetine along with Flexeril and Voltaren gel.  Also uses PRN tramadol.   COPD with asthma ([place])    On combination of albuterol Qvar and Symbicort   Depression    Diabetes mellitus without complication ([place])    Diverticulitis large intestine 05/12/2020   Hypertension     Past Surgical History:  Procedure Laterality Date   ABDOMINAL HYSTERECTOMY     COLONOSCOPY WITH PROPOFOL N/A 10/02/2021   Procedure: COLONOSCOPY WITH PROPOFOL;  Surgeon: Lin Landsman, MD;  Location: [facility] ENDOSCOPY;  Service: Gastroenterology;  Laterality: N/A;   ESOPHAGOGASTRODUODENOSCOPY N/A 10/02/2021   Procedure: ESOPHAGOGASTRODUODENOSCOPY (EGD);  Surgeon: Lin Landsman, MD;  Location: [facility] ENDOSCOPY;  Service: Gastroenterology;  Laterality: N/A;   TRANSTHORACIC ECHOCARDIOGRAM  09/29/2015   EF 55-60%.  No R WMA.  GR 1 DD.  Is also normal valves.  Normal study.   TUBAL LIGATION      Current Outpatient Medications:    acetaminophen (TYLENOL) 325 MG tablet, Take 2 tablets (650 mg total) by  mouth every 6 (six) hours as needed for mild pain or fever., Disp: 30 tablet, Rfl: 0   albuterol (PROVENTIL) (2.5 MG/3ML) 0.083% nebulizer solution, Take 3 mLs (2.5 mg total) by nebulization every 2 (two) hours as needed for wheezing or shortness of breath., Disp: 75 mL, Rfl: 0   albuterol (VENTOLIN HFA) 108 (90 Base) MCG/ACT inhaler, Inhale 2 puffs into the lungs every 6 (six) hours as needed for wheezing or shortness of breath., Disp: 8 g, Rfl: 2   aspirin EC 81 MG tablet, Take 81 mg by mouth daily., Disp: , Rfl:    atorvastatin (LIPITOR) 10 MG tablet, Take 10 mg by mouth daily., Disp: , Rfl:    budesonide-formoterol (SYMBICORT) 160-4.5 MCG/ACT inhaler, Inhale 2 puffs into the lungs 2 (two) times daily., Disp: 1 each, Rfl: 12   Cholecalciferol (VITAMIN D3) 1.25 MG (50000 UT) CAPS, Take 1 capsule by mouth once a week., Disp: , Rfl:    cyclobenzaprine (FLEXERIL) 5 MG tablet, TAKE 1 TABLET BY MOUTH NIGHTLY AS NEEDEDFOR MUSCLE SPASMS, Disp: , Rfl:    diclofenac sodium (VOLTAREN) 1 % GEL, Apply 2 g  topically 4 (four) times daily as needed (pain)., Disp: 1 Tube, Rfl: 0   DULoxetine (CYMBALTA) 20 MG capsule, Take 20 mg by mouth daily., Disp: , Rfl:    fluticasone-salmeterol (ADVAIR DISKUS) 250-50 MCG/ACT AEPB, Inhale 1 puff into the lungs in the morning and at bedtime., Disp: 60 each, Rfl: 2   gabapentin (NEURONTIN) 600 MG tablet, Take by mouth 2 (two) times daily., Disp: , Rfl:    glipiZIDE (GLUCOTROL) 5 MG tablet, Take 1 tablet (5 mg total) by mouth every morning., Disp: 30 tablet, Rfl: 0   linaclotide (LINZESS) 290 MCG CAPS capsule, Take 1 capsule (290 mcg total) by mouth daily before breakfast., Disp: 90 capsule, Rfl: 2   loratadine (CLARITIN) 10 MG tablet, Take 1 tablet (10 mg total) by mouth daily., Disp: 30 tablet, Rfl: 0   losartan (COZAAR) 50 MG tablet, Take 1 tablet (50 mg total) by mouth daily., Disp: 30 tablet, Rfl: 1   metFORMIN (GLUCOPHAGE) 1000 MG tablet, Take 1 tablet (1,000 mg total) by  mouth 2 (two) times daily with a meal. See discharge instructions for how to take medicine at first, Disp: 60 tablet, Rfl: 1   metoprolol tartrate (LOPRESSOR) 50 MG tablet, Take 50 mg by mouth 2 (two) times daily., Disp: , Rfl:    omeprazole (PRILOSEC) 40 MG capsule, Take 1 capsule (40 mg total) by mouth daily., Disp: 30 capsule, Rfl: 2   polyethylene glycol (MIRALAX / GLYCOLAX) 17 g packet, Take 17 g by mouth 2 (two) times daily., Disp: 30 each, Rfl: 1   Semaglutide (RYBELSUS) 7 MG TABS, Take by mouth daily., Disp: , Rfl:    sertraline (ZOLOFT) 50 MG tablet, Take 1.5 tablets (75 mg total) by mouth every morning., Disp: 45 tablet, Rfl: 0   traMADol (ULTRAM) 50 MG tablet, Take by mouth every 6 (six) hours as needed., Disp: , Rfl:     Family History  Problem Relation Age of Onset   Hypertension Mother    Other Other      Social History   Tobacco Use   Smoking status: Former    Packs/day: 3.00    Years: 10.00    Pack years: 30.00    Types: Cigarettes    Quit date: 12/22/2004    Years since quitting: 17.0   Smokeless tobacco: Never  Vaping Use   Vaping Use: Never used  Substance Use Topics   Alcohol use: No   Drug use: No    Allergies as of 01/15/2022   (No Known Allergies)    Review of Systems:    All systems reviewed and negative except where noted in HPI.   Physical Exam:  BP 129/85 (BP Location: Left Arm, Patient Position: Sitting, Cuff Size: Normal)    Pulse (!) 108    Temp (!) 97.5 F (36.4 C) (Oral)    Ht 5' 1.5" (1.562 m)    Wt 159 lb 4 oz (72.2 kg)    BMI 29.60 kg/m  No LMP recorded. Patient has had a hysterectomy.  General:   Alert,  Well-developed, well-nourished, pleasant and cooperative in NAD Head:  Normocephalic and atraumatic. Eyes:  Sclera clear, no icterus.   Conjunctiva pink, bilateral proptosis. Ears:  Normal auditory acuity. Nose:  No deformity, discharge, or lesions. Mouth:  No deformity or lesions,oropharynx pink & moist. Neck:  Supple; no masses  or thyromegaly. Lungs:  Respirations even and unlabored.  Clear throughout to auscultation.   No wheezes, crackles, or rhonchi. No acute distress. Heart:  Regular  rate and rhythm; no murmurs, clicks, rubs, or gallops. Abdomen:  Normal bowel sounds. Soft, nontender, nondistended, without masses, hepatosplenomegaly or hernias noted.  No guarding or rebound tenderness.   Rectal: Not performed Msk:  Symmetrical without gross deformities. Good, equal movement & strength bilaterally. Pulses:  Normal pulses noted. Extremities:  No clubbing or edema.  No cyanosis. Neurologic:  Alert and oriented x3;  grossly normal neurologically. Skin:  Intact without significant lesions or rashes. No jaundice. Psych:  Alert and cooperative. Normal mood and affect.  Imaging Studies: Reviewed  Assessment and Plan:   [name] is a 56 y.o. African-American female with obesity, metabolic syndrome, poorly controlled diabetes on metformin, diabetic neuropathy is seen in consultation for chronic lower abdominal pain, abdominal bloating and irregular bowel habits.  Patient is also concerned about unintentional weight loss.  Her symptoms are likely worsened with gabapentin use and she may have diabetic autonomic neuropathy affecting her GI tract.    Chronic idiopathic constipation Linzess 290 MCG has led to diarrhea, although bloating and abdominal pain have improved.  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

## 2022-01-27 ENCOUNTER — Emergency Department: Payer: 59

## 2022-01-27 ENCOUNTER — Other Ambulatory Visit: Payer: Self-pay

## 2022-01-27 ENCOUNTER — Encounter: Payer: Self-pay | Admitting: *Deleted

## 2022-01-27 ENCOUNTER — Emergency Department
Admission: EM | Admit: 2022-01-27 | Discharge: 2022-01-27 | Disposition: A | Discharge status: Home / Self Care | Payer: 59 | Attending: Emergency Medicine | Admitting: Emergency Medicine

## 2022-01-27 DIAGNOSIS — X501XXA Overexertion from prolonged static or awkward postures, initial encounter: Secondary | ICD-10-CM | POA: Diagnosis not present

## 2022-01-27 DIAGNOSIS — S299XXA Unspecified injury of thorax, initial encounter: Secondary | ICD-10-CM | POA: Diagnosis present

## 2022-01-27 DIAGNOSIS — S20211A Contusion of right front wall of thorax, initial encounter: Secondary | ICD-10-CM | POA: Diagnosis not present

## 2022-01-27 DIAGNOSIS — Z7982 Long term (current) use of aspirin: Secondary | ICD-10-CM | POA: Diagnosis not present

## 2022-01-27 MED ORDER — HYDROCODONE-ACETAMINOPHEN 5-325 MG PO TABS
1.0000 | ORAL_TABLET | ORAL | Status: AC
Start: 2022-01-27 — End: 2022-01-27
  Administered 2022-01-27: 1 via ORAL
  Filled 2022-01-27: qty 1

## 2022-01-27 MED ORDER — HYDROCODONE-ACETAMINOPHEN 5-325 MG PO TABS: 1.0000 | ORAL_TABLET | Freq: Four times a day (QID) | ORAL | 0 refills | Status: DC | PRN

## 2022-01-27 NOTE — ED Triage Notes (Signed)
Pt states she reached into the washing machine 4 days ago and heard a pop in right anterior rib area.  Pt states it hurts to cough.  Pt alert.

## 2022-01-27 NOTE — Discharge Instructions (Signed)
Please apply ice to the right ribs 20 minutes every hour.  Take Norco as needed for moderate to severe pain.  Make sure you are taking good deep breaths.  Follow-up with primary care provider in 1 week if no improvement.  Return to the ER for any fevers worsening symptoms or any urgent changes in her health

## 2022-01-27 NOTE — ED Provider Notes (Signed)
[facility] EMERGENCY DEPARTMENT Provider Note   CSN: [id number] Arrival date & time: 01/27/22  2013     History  Chief Complaint  Patient presents with   Rib Injury    [name] is a 56 y.o. female.  Presents emergency department for evaluation of right rib pain.  Patient states 4 days ago she was reaching into a large washing machine, felt a pop along the right anterior rib, has had pain with taking a deep breath and with touch to the right anterior rib.  She denies any chest pain, shortness of breath or fevers.  She has a history of diabetes, this is not well controlled.  She denies any back pain or radicular symptoms  HPI     Home Medications Prior to Admission medications   Medication Sig Start Date End Date Taking? Authorizing Provider  HYDROcodone-acetaminophen (NORCO) 5-325 MG tablet Take 1 tablet by mouth every 6 (six) hours as needed for moderate pain. 01/27/22  Yes Duanne Guess, PA-C  acetaminophen (TYLENOL) 325 MG tablet Take 2 tablets (650 mg total) by mouth every 6 (six) hours as needed for mild pain or fever. 05/13/20   Lorella Nimrod, MD  albuterol (PROVENTIL) (2.5 MG/3ML) 0.083% nebulizer solution Take 3 mLs (2.5 mg total) by nebulization every 2 (two) hours as needed for wheezing or shortness of breath. 01/28/19   [place], Sara Chu, PA  albuterol (VENTOLIN HFA) 108 (90 Base) MCG/ACT inhaler Inhale 2 puffs into the lungs every 6 (six) hours as needed for wheezing or shortness of breath. 01/01/22   Tyler Pita, MD  aspirin EC 81 MG tablet Take 81 mg by mouth daily.    [provider]  atorvastatin (LIPITOR) 10 MG tablet Take 10 mg by mouth daily.    [provider]  budesonide-formoterol (SYMBICORT) 160-4.5 MCG/ACT inhaler Inhale 2 puffs into the lungs 2 (two) times daily. 01/01/22   Tyler Pita, MD  Cholecalciferol (VITAMIN D3) 1.25 MG (50000 UT) CAPS Take 1 capsule by mouth once a week. 02/09/20   [provider]  cyclobenzaprine (FLEXERIL) 5 MG tablet TAKE 1 TABLET BY MOUTH NIGHTLY AS NEEDEDFOR MUSCLE SPASMS 11/01/20   [provider]  diclofenac sodium (VOLTAREN) 1 % GEL Apply 2 g topically 4 (four) times daily as needed (pain). 01/28/19   Ripley Fraise, PA  DULoxetine (CYMBALTA) 20 MG capsule Take 20 mg by mouth daily. 07/11/21 07/11/22  [provider]  fluticasone-salmeterol (ADVAIR DISKUS) 250-50 MCG/ACT AEPB Inhale 1 puff into the lungs in the morning and at bedtime. 01/06/22   Tyler Pita, MD  gabapentin (NEURONTIN) 600 MG tablet Take by mouth 2 (two) times daily. 09/17/20   [provider]  glipiZIDE (GLUCOTROL) 5 MG tablet Take 1 tablet (5 mg total) by mouth every morning. 01/28/19   Ripley Fraise, PA  linaclotide (LINZESS) 290 MCG CAPS capsule Take 1 capsule (290 mcg total) by mouth daily before breakfast. 10/30/21 07/27/22  Lin Landsman, MD  loratadine (CLARITIN) 10 MG tablet Take 1 tablet (10 mg total) by mouth daily. 01/28/19   Ripley Fraise, PA  losartan (COZAAR) 50 MG tablet Take 1 tablet (50 mg total) by mouth daily. 01/28/19   Ripley Fraise, PA  metFORMIN (GLUCOPHAGE) 1000 MG tablet Take 1 tablet (1,000 mg total) by mouth 2 (two) times daily with a meal. See discharge instructions for how to take medicine at first 01/28/19   [place], Joana, PA  metoprolol tartrate (LOPRESSOR) 50 MG tablet  Take 50 mg by mouth 2 (two) times daily.    [provider]  omeprazole (PRILOSEC) 40 MG capsule Take 1 capsule (40 mg total) by mouth daily. 01/01/22   Tyler Pita, MD  polyethylene glycol (MIRALAX / GLYCOLAX) 17 g packet Take 17 g by mouth 2 (two) times daily. 05/13/20   Lorella Nimrod, MD  Semaglutide (RYBELSUS) 7 MG TABS Take by mouth daily.    [provider]  sertraline (ZOLOFT) 50 MG tablet Take 1.5 tablets (75 mg total) by mouth every morning. 01/28/19   Ripley Fraise, PA  traMADol (ULTRAM) 50 MG tablet Take by mouth every 6 (six) hours as needed.    [provider]      Allergies    Patient has no known allergies.    Review of Systems   Review of Systems  Physical Exam Updated Vital Signs BP (!) 173/99 (BP Location: Left Arm)    Pulse 87    Temp 98.3 F (36.8 C) (Oral)    Resp 20    Ht 5\' 1"  (1.549 m)    Wt 78.5 kg    SpO2 96%    BMI 32.69 kg/m  Physical Exam Constitutional:      Appearance: She is well-developed.  HENT:     Head: Normocephalic and atraumatic.  Eyes:     Conjunctiva/sclera: Conjunctivae normal.  Cardiovascular:     Rate and Rhythm: Normal rate.  Pulmonary:     Effort: Pulmonary effort is normal. No respiratory distress.  Abdominal:     General: There is no distension.     Palpations: Abdomen is soft.     Tenderness: There is no abdominal tenderness. There is no guarding.  Musculoskeletal:        General: Normal range of motion.     Cervical back: Normal range of motion.     Comments: Right ribs tender along the anterior distal ribs along the anterior axillary line.  Patient has no step-off, bruising.  There is no abdominal tenderness on exam.  Skin:    General: Skin is warm.     Findings: No rash.  Neurological:     Mental Status: She is alert and oriented to person, place, and time.  Psychiatric:        Behavior: Behavior normal.        Thought Content: Thought content normal.    ED Results / Procedures / Treatments   Labs (all labs ordered are listed, but only abnormal results are displayed) Labs Reviewed - No data to display  EKG None  Radiology DG Ribs Unilateral W/Chest Right  Result Date: 01/27/2022 CLINICAL DATA:  Right rib pain EXAM: RIGHT RIBS AND CHEST - 3+ VIEW COMPARISON:  01/01/2022 FINDINGS: Linear subsegmental atelectasis at the right lung base. Otherwise no confluent opacities or effusions. Heart is normal size. No pneumothorax. No acute bony abnormality. No visualized displaced rib fracture. IMPRESSION: No visible rib fracture. Right base atelectasis. Electronically Signed    By: Rolm Baptise M.D.   On: 01/27/2022 21:34    Procedures Procedures    Medications Ordered in ED Medications  HYDROcodone-acetaminophen (NORCO/VICODIN) 5-325 MG per tablet 1 tablet (has no administration in time range)    ED Course/ Medical Decision Making/ A&P                           Medical Decision Making Amount and/or Complexity of Data Reviewed Radiology: ordered.  Risk Prescription drug management.  56 year old female with right anterior rib tenderness after reaching into a washing machine a few days ago.  She has pain with taking a deep breath and tenderness to palpation.  X-ray of the right ribs and AP chest x-ray showed no evidence of pneumothorax or rib fracture.  She is given some pain medication to help with taking a deep breath.  She will take this as needed for severe pain.  She will follow-up PCP if no improvement 1 week.  She understands signs symptoms return to the ER for. Final Clinical Impression(s) / ED Diagnoses Final diagnoses:  Rib contusion, right, initial encounter    Rx / DC Orders ED Discharge Orders          Ordered    HYDROcodone-acetaminophen (NORCO) 5-325 MG tablet  Every 6 hours PRN        01/27/22 2149              Renata Caprice 01/27/22 2152    Blake Divine, MD 01/27/22 2352

## 2022-01-31 ENCOUNTER — Encounter (HOSPITAL_COMMUNITY): Payer: Self-pay

## 2022-01-31 ENCOUNTER — Other Ambulatory Visit: Payer: Self-pay

## 2022-01-31 ENCOUNTER — Emergency Department (HOSPITAL_COMMUNITY): Payer: 59

## 2022-01-31 ENCOUNTER — Emergency Department (HOSPITAL_COMMUNITY)
Admission: EM | Admit: 2022-01-31 | Discharge: 2022-02-01 | Disposition: A | Discharge status: Home / Self Care | Payer: 59 | Attending: Emergency Medicine | Admitting: Emergency Medicine

## 2022-01-31 DIAGNOSIS — W228XXD Striking against or struck by other objects, subsequent encounter: Secondary | ICD-10-CM | POA: Insufficient documentation

## 2022-01-31 DIAGNOSIS — S299XXD Unspecified injury of thorax, subsequent encounter: Secondary | ICD-10-CM | POA: Diagnosis present

## 2022-01-31 DIAGNOSIS — R109 Unspecified abdominal pain: Secondary | ICD-10-CM | POA: Diagnosis not present

## 2022-01-31 DIAGNOSIS — Z7982 Long term (current) use of aspirin: Secondary | ICD-10-CM | POA: Diagnosis not present

## 2022-01-31 LAB — COMPREHENSIVE METABOLIC PANEL
ALT: 31 U/L (ref 0–44)
AST: 26 U/L (ref 15–41)
Albumin: 3.8 g/dL (ref 3.5–5.0)
Alkaline Phosphatase: 128 U/L — ABNORMAL HIGH (ref 38–126)
Anion gap: 7 (ref 5–15)
BUN: 19 mg/dL (ref 6–20)
CO2: 28 mmol/L (ref 22–32)
Calcium: 9.6 mg/dL (ref 8.9–10.3)
Chloride: 104 mmol/L (ref 98–111)
Creatinine, Ser: 0.73 mg/dL (ref 0.44–1.00)
GFR, Estimated: >60 mL/min (ref >60)
Glucose, Bld: 355 mg/dL — ABNORMAL HIGH (ref 70–99)
Potassium: 4.5 mmol/L (ref 3.5–5.1)
Sodium: 139 mmol/L (ref 135–145)
Total Bilirubin: 0.5 mg/dL (ref 0.3–1.2)
Total Protein: 6.8 g/dL (ref 6.5–8.1)

## 2022-01-31 LAB — CBC WITH DIFFERENTIAL/PLATELET
Abs Immature Granulocytes: 0.03 10*3/uL (ref 0.00–0.07)
Basophils Absolute: 0 10*3/uL (ref 0.0–0.1)
Basophils Relative: 0 %
Eosinophils Absolute: 0 10*3/uL (ref 0.0–0.5)
Eosinophils Relative: 0 %
HCT: 38.1 % (ref 36.0–46.0)
Hemoglobin: 12.1 g/dL (ref 12.0–15.0)
Immature Granulocytes: 0 %
Lymphocytes Relative: 21 %
Lymphs Abs: 1.4 10*3/uL (ref 0.7–4.0)
MCH: 30.3 pg (ref 26.0–34.0)
MCHC: 31.8 g/dL (ref 30.0–36.0)
MCV: 95.5 fL (ref 80.0–100.0)
Monocytes Absolute: 0.6 10*3/uL (ref 0.1–1.0)
Monocytes Relative: 9 %
Neutro Abs: 4.6 10*3/uL (ref 1.7–7.7)
Neutrophils Relative %: 70 %
Platelets: 230 10*3/uL (ref 150–400)
RBC: 3.99 MIL/uL (ref 3.87–5.11)
RDW: 14.6 % (ref 11.5–15.5)
WBC: 6.8 10*3/uL (ref 4.0–10.5)
nRBC: 0 % (ref 0.0–0.2)

## 2022-01-31 MED ORDER — IOHEXOL 300 MG/ML  SOLN
100.0000 mL | Freq: Once | INTRAMUSCULAR | Status: AC | PRN
  Administered 2022-01-31: 100 mL via INTRAVENOUS

## 2022-01-31 NOTE — ED Provider Notes (Signed)
[facility] DEPT Provider Note   CSN: [id number] Arrival date & time: 01/31/22  2113     History  Chief Complaint  Patient presents with   Rib Injury    [name] is a 56 y.o. female.  HPI Patient injured her right chest wall, 6 days ago when she was reaching into a washer machine and fell backwards, onto her right side striking her chest on a toolbox.  She is able to ambulate.  She went to an emergency department, the next day and was evaluated with plain images and told that she had a bruise.  She was prescribed 10 hydrocodone, which she has used without relief.  She has decreased appetite because eating causes pain.  She also complains of pain with movement and deep breathing and cough.  She is coughing and producing green but not bloody sputum.    Home Medications Prior to Admission medications   Medication Sig Start Date End Date Taking? Authorizing Provider  acetaminophen (TYLENOL) 325 MG tablet Take 2 tablets (650 mg total) by mouth every 6 (six) hours as needed for mild pain or fever. 05/13/20   Lorella Nimrod, MD  albuterol (PROVENTIL) (2.5 MG/3ML) 0.083% nebulizer solution Take 3 mLs (2.5 mg total) by nebulization every 2 (two) hours as needed for wheezing or shortness of breath. 01/28/19   Lule, Sara Chu, PA  albuterol (VENTOLIN HFA) 108 (90 Base) MCG/ACT inhaler Inhale 2 puffs into the lungs every 6 (six) hours as needed for wheezing or shortness of breath. 01/01/22   Tyler Pita, MD  aspirin EC 81 MG tablet Take 81 mg by mouth daily.    [provider]  atorvastatin (LIPITOR) 10 MG tablet Take 10 mg by mouth daily.    [provider]  budesonide-formoterol (SYMBICORT) 160-4.5 MCG/ACT inhaler Inhale 2 puffs into the lungs 2 (two) times daily. 01/01/22   Tyler Pita, MD  Cholecalciferol (VITAMIN D3) 1.25 MG (50000 UT) CAPS Take 1 capsule by mouth once a week. 02/09/20   [provider]  cyclobenzaprine  (FLEXERIL) 5 MG tablet TAKE 1 TABLET BY MOUTH NIGHTLY AS NEEDEDFOR MUSCLE SPASMS 11/01/20   [provider]  diclofenac sodium (VOLTAREN) 1 % GEL Apply 2 g topically 4 (four) times daily as needed (pain). 01/28/19   Ripley Fraise, PA  DULoxetine (CYMBALTA) 20 MG capsule Take 20 mg by mouth daily. 07/11/21 07/11/22  [provider]  fluticasone-salmeterol (ADVAIR DISKUS) 250-50 MCG/ACT AEPB Inhale 1 puff into the lungs in the morning and at bedtime. 01/06/22   Tyler Pita, MD  gabapentin (NEURONTIN) 600 MG tablet Take by mouth 2 (two) times daily. 09/17/20   [provider]  glipiZIDE (GLUCOTROL) 5 MG tablet Take 1 tablet (5 mg total) by mouth every morning. 01/28/19   Ripley Fraise, PA  HYDROcodone-acetaminophen (NORCO) 5-325 MG tablet Take 1 tablet by mouth every 6 (six) hours as needed for moderate pain. 01/27/22   Duanne Guess, PA-C  linaclotide [facility]) 290 MCG CAPS capsule Take 1 capsule (290 mcg total) by mouth daily before breakfast. 10/30/21 07/27/22  Lin Landsman, MD  loratadine (CLARITIN) 10 MG tablet Take 1 tablet (10 mg total) by mouth daily. 01/28/19   Ripley Fraise, PA  losartan (COZAAR) 50 MG tablet Take 1 tablet (50 mg total) by mouth daily. 01/28/19   Ripley Fraise, PA  metFORMIN (GLUCOPHAGE) 1000 MG tablet Take 1 tablet (1,000 mg total) by mouth 2 (two) times daily with a meal. See  discharge instructions for how to take medicine at first 01/28/19   Tuttle, [place], PA  metoprolol tartrate (LOPRESSOR) 50 MG tablet Take 50 mg by mouth 2 (two) times daily.    [provider]  omeprazole (PRILOSEC) 40 MG capsule Take 1 capsule (40 mg total) by mouth daily. 01/01/22   Tyler Pita, MD  polyethylene glycol (MIRALAX / GLYCOLAX) 17 g packet Take 17 g by mouth 2 (two) times daily. 05/13/20   Lorella Nimrod, MD  Semaglutide (RYBELSUS) 7 MG TABS Take by mouth daily.    [provider]  sertraline (ZOLOFT) 50 MG tablet Take 1.5 tablets (75 mg total) by mouth  every morning. 01/28/19   Ripley Fraise, PA  traMADol (ULTRAM) 50 MG tablet Take by mouth every 6 (six) hours as needed.    [provider]      Allergies    Patient has no known allergies.    Review of Systems   Review of Systems  Physical Exam Updated Vital Signs BP (!) 175/99 (BP Location: Left Arm)    Pulse 74    Temp 97.9 F (36.6 C) (Oral)    Resp 16    Ht _0  (1.549 m)    Wt 78.5 kg    SpO2 100%    BMI 32.69 kg/m  Physical Exam Vitals and nursing note reviewed.  Constitutional:      General: She is in acute distress.     Appearance: She is well-developed. She is obese. She is not ill-appearing, toxic-appearing or diaphoretic.  HENT:     Head: Normocephalic and atraumatic.     Right Ear: External ear normal.     Left Ear: External ear normal.  Eyes:     Conjunctiva/sclera: Conjunctivae normal.     Pupils: Pupils are equal, round, and reactive to light.  Neck:     Trachea: Phonation normal.  Cardiovascular:     Rate and Rhythm: Normal rate and regular rhythm.     Comments: Hypertensive Pulmonary:     Effort: Pulmonary effort is normal. No respiratory distress.     Breath sounds: No stridor.  Chest:     Chest wall: Tenderness (Right anterior lower chest wall, moderate without crepitation or deformity.) present.  Abdominal:     General: There is no distension.     Palpations: Abdomen is soft.     Tenderness: There is no abdominal tenderness.     Comments: No tenderness of the upper abdomen.  Musculoskeletal:        General: Normal range of motion.     Cervical back: Normal range of motion and neck supple.  Skin:    General: Skin is warm and dry.  Neurological:     Mental Status: She is alert and oriented to person, place, and time.     Cranial Nerves: No cranial nerve deficit.     Sensory: No sensory deficit.     Motor: No abnormal muscle tone.     Coordination: Coordination normal.  Psychiatric:        Behavior: Behavior normal.        Thought  Content: Thought content normal.        Judgment: Judgment normal.    ED Results / Procedures / Treatments   Labs (all labs ordered are listed, but only abnormal results are displayed) Labs Reviewed  COMPREHENSIVE METABOLIC PANEL - Abnormal; Notable for the following components:      Result Value   Glucose, Bld 355 (*)  Alkaline Phosphatase 128 (*)    All other components within normal limits  CBC WITH DIFFERENTIAL/PLATELET    EKG None  Radiology No results found.  Procedures Procedures    Medications Ordered in ED Medications - No data to display  ED Course/ Medical Decision Making/ A&P                           Medical Decision Making She is presenting with right lower chest wall pain after a fall 6 days ago.  Previously evaluated plain images without abnormal findings.  Taking hydrocodone without relief.  She has associated cough and decreased eating secondary to pain.  Problems Addressed: Injury of chest wall, subsequent encounter: acute illness or injury  Amount and/or Complexity of Data Reviewed Independent Historian:     Details: She is able to to supply a cogent history. Labs: ordered.    Details: CBC, c-Met-normal except glucose high Radiology: ordered.    Details: CT chest, CT abdomen, trauma protocols.  Risk Risk Details: Patient required advanced imaging for persistent pain after fall with mechanical injury that could injure chest wall, ribs, muscles, or upper abdominal contents including liver and kidney.  Care transferred to oncoming team, Dr. Sedonia Small pending return of CT images.           Final Clinical Impression(s) / ED Diagnoses Final diagnoses:  Injury of chest wall, subsequent encounter    Rx / DC Orders ED Discharge Orders     None         Daleen Bo, MD 01/31/22 2338

## 2022-01-31 NOTE — ED Triage Notes (Signed)
Patient reports rib injury that occurred 02/02, was seen at [facility] and sent home with hydrocodone. Patient states she took all the pain medication and OTC pain reliever with no relief. Pt tearful upon triage, state she would like to be reevaluated.

## 2022-01-31 NOTE — ED Provider Notes (Signed)
°  Provider Note MRN:  [id number]  Arrival date & time: 02/01/22    ED Course and Medical Decision Making  Assumed care from Dr. Eulis Foster at shift change.  Persistent rib pain after fall awaiting CT imaging.  Imaging is reassuring, providing patient with pain regimen at home.  Procedures  Final Clinical Impressions(s) / ED Diagnoses     ICD-10-CM   1. Injury of chest wall, subsequent encounter  S29.9XXD       ED Discharge Orders          Ordered    lidocaine (LIDODERM) 5 %  Every 24 hours        02/01/22 0053    naproxen (NAPROSYN) 500 MG tablet  2 times daily        02/01/22 0053    oxyCODONE (ROXICODONE) 5 MG immediate release tablet  Every 4 hours PRN        02/01/22 0053              Discharge Instructions      You were evaluated in the Emergency Department and after careful evaluation, we did not find any emergent condition requiring admission or further testing in the hospital.  Your exam/testing today is overall reassuring.  CT imaging did not show any significant injuries or emergencies.  Suspect your pain is due to bruised ribs.  You will have continued pain for several days/weeks.  Take the Naprosyn anti-inflammatory twice a day as directed.  Use the Lidoderm patches daily.  Take the oxycodone only if pain is keeping you from sleeping.  Please return to the Emergency Department if you experience any worsening of your condition.   Thank you for allowing [place] to be a part of your care.      Barth Kirks. Sedonia Small, [place] [email or web address]    Maudie Flakes, MD 02/01/22 [phone]

## 2022-02-01 ENCOUNTER — Emergency Department (HOSPITAL_COMMUNITY): Payer: 59

## 2022-02-01 ENCOUNTER — Encounter: Payer: Self-pay | Admitting: Cardiology

## 2022-02-01 MED ORDER — NAPROXEN 500 MG PO TABS: 500.0000 mg | ORAL_TABLET | Freq: Two times a day (BID) | ORAL | 0 refills | Status: DC

## 2022-02-01 MED ORDER — LIDOCAINE 5 % EX PTCH: 1.0000 | MEDICATED_PATCH | CUTANEOUS | 0 refills | Status: DC

## 2022-02-01 MED ORDER — OXYCODONE HCL 5 MG PO TABS: 5.0000 mg | ORAL_TABLET | ORAL | 0 refills | Status: DC | PRN

## 2022-02-01 MED ORDER — OXYCODONE HCL 5 MG PO TABS
5.0000 mg | ORAL_TABLET | Freq: Once | ORAL | Status: AC
  Administered 2022-02-01: 5 mg via ORAL
  Filled 2022-02-01: qty 1

## 2022-02-01 NOTE — ED Notes (Signed)
MD aware patient requesting pain meds.

## 2022-02-01 NOTE — Discharge Instructions (Signed)
You were evaluated in the Emergency Department and after careful evaluation, we did not find any emergent condition requiring admission or further testing in the hospital.  Your exam/testing today is overall reassuring.  CT imaging did not show any significant injuries or emergencies.  Suspect your pain is due to bruised ribs.  You will have continued pain for several days/weeks.  Take the Naprosyn anti-inflammatory twice a day as directed.  Use the Lidoderm patches daily.  Take the oxycodone only if pain is keeping you from sleeping.  Please return to the Emergency Department if you experience any worsening of your condition.   Thank you for allowing [place] to be a part of your care.

## 2022-02-06 ENCOUNTER — Other Ambulatory Visit: Payer: Self-pay

## 2022-02-06 ENCOUNTER — Ambulatory Visit (INDEPENDENT_AMBULATORY_CARE_PROVIDER_SITE_OTHER): Payer: 59 | Admitting: Cardiology

## 2022-02-06 ENCOUNTER — Encounter: Payer: Self-pay | Admitting: Cardiology

## 2022-02-06 VITALS — BP 142/90 | HR 66 | Ht 61.5 in | Wt 158.4 lb

## 2022-02-06 DIAGNOSIS — I10 Essential (primary) hypertension: Secondary | ICD-10-CM | POA: Diagnosis not present

## 2022-02-06 DIAGNOSIS — E785 Hyperlipidemia, unspecified: Secondary | ICD-10-CM

## 2022-02-06 DIAGNOSIS — E1169 Type 2 diabetes mellitus with other specified complication: Secondary | ICD-10-CM

## 2022-02-06 DIAGNOSIS — R002 Palpitations: Secondary | ICD-10-CM

## 2022-02-06 MED ORDER — METOPROLOL TARTRATE 50 MG PO TABS: ORAL_TABLET | ORAL | 3 refills | Status: DC

## 2022-02-06 NOTE — Progress Notes (Signed)
Primary Care Provider: [name], MD Cardiologist: None Electrophysiologist: None  Clinic Note: Chief Complaint  Patient presents with   6 week follow up     Patient c/o feeling tired/exhausted, swelling in left leg, chest pain and has shortness of breath with little to no exertion. Medications reviewed by the patient verbally.     ===================================  ASSESSMENT/PLAN   Problem List Items Addressed This Visit       Cardiology Problems   Essential hypertension (Chronic)    Borderline elevated blood pressure today.  Plan: Increase morning dose of Lopressor to 100 mg and continue 50 mg in evening.  Still has room to titrate up losartan if necessary.      Relevant Medications   metoprolol tartrate (LOPRESSOR) 50 MG tablet   Hyperlipidemia associated with type 2 diabetes mellitus ([place]) (Chronic)    Unfortunately have labs available.  She is on atorvastatin with labs being followed by PCP.  On combination of glipizide, metformin and semaglutide.      Relevant Medications   metoprolol tartrate (LOPRESSOR) 50 MG tablet     Other   Palpitations - Primary    Well-controlled.  Short little tachycardia spells on the monitor.  Nothing significant no sustained arrhythmias.  She did have occasional PACs which are somewhat symptomatic.  Plan: Increase daytime dose of metoprolol to 100 mg and continue p.m. dose of 50.  Okay to use additional 1 g as needed for palpitations.  Maintain adequate hydration and reduce stress.      Relevant Medications   metoprolol tartrate (LOPRESSOR) 50 MG tablet   Other Relevant Orders   EKG 12-Lead (Completed)   ===================================  HPI:    [name] is a 56 y.o. female with a PMH notable for HTN who presents today for 1 month follow-up to discuss test results.  [name] was last seen on 12/26/2021: Palpitations/irregular heartbeats, fatigue and exertional dyspnea as well as chest discomfort.   Some lightheadedness and dizziness. Zio patch monitor, echocardiogram and Coronary CTA ordered.  Recent Hospitalizations:  [name], ER 01/31/2022 chest wall injury-right rib contusion  Reviewed  CV studies:    The following studies were reviewed today: (if available, images/films reviewed: From Epic Chart or Care Everywhere) Coronary CTA 01/03/2022: Thoracic aortic atherosclerosis.  Coronary Calcium Score 1.29.  Minimal proximal LAD calcification. (<25%). TTE 01/09/2022: EF 60 to 65%.  Mild to moderate LVH.  G1 DD.  Normal RV size and function.  Normal atrial sizes.  Normal valves.  NORMAL ECHO Event monitor 7-day Zio patch monitor: Predominant sinus rhythm.  Rate range 38-136 bpm with an average of 80 bpm (bradycardia only noted during hours of sleep.  5 Atrial Runs-fastest was 5 beats at a max rate 162 bpm, longest was 17.2 seconds/33 beats at a rate of 119 bpm--not noted on patient trigger.  Frequent PACs noted (5.3%).  Rare PVCs.   Interval History:   [name] returns here today for cardiology follow-up doing better.  She still has some intermittent episodes of heart racing, but really only notes the chest discomfort on the right side of her chest after contusion.  She still has some exertional dyspnea but was happy to hear the results of her echo and Coronary CTA.  CV Review of Symptoms (Summary) Cardiovascular ROS: positive for - chest pain, dyspnea on exertion, palpitations, rapid heart rate, and occasional dizziness but no real near syncope.  Chest discomfort is right-sided.  Mild exertional dyspnea that is more fatigued.  negative for - edema, orthopnea, paroxysmal nocturnal dyspnea, shortness of breath, or syncope/near syncope or TIA/amaurosis fugax, claudication  REVIEWED OF SYSTEMS   Pertinent ROS: Otherwise noted above. Still has some headaches and nausea.  Some dizziness.  Also fatigue with exercise intolerance.  Otherwise normal if not noted above.  I have reviewed  and (if needed) personally updated the patient's problem list, medications, allergies, past medical and surgical history, social and family history.   PAST MEDICAL HISTORY   Past Medical History:  Diagnosis Date   Acute diverticulitis    Acute pain of right knee 10/14/2016   Acute renal failure (ARF) ([name]) 01/26/2019   Chronic back pain    Complicated by neuropathy. ->  On Neurontin and duloxetine along with Flexeril and Voltaren gel.  Also uses PRN tramadol.   COPD with asthma ([place])    On combination of albuterol Qvar and Symbicort   Depression    Diabetes mellitus without complication ([place])    Diverticulitis large intestine 05/12/2020   Hypertension     PAST SURGICAL HISTORY   Past Surgical History:  Procedure Laterality Date   7-Day Zio Patch Monitor  12/2021   Predominant sinus rhythm.  Rate range 38-136 bpm with an average of 80 bpm (bradycardia only noted during hours of sleep.  5 Atrial Runs-fastest was 5 beats at a max rate 162 bpm, longest was 17.2 seconds/33 beats at a rate of 119 bpm--not noted on patient trigger.  Frequent PACs noted (5.3%).  Rare PVCs.   ABDOMINAL HYSTERECTOMY     COLONOSCOPY WITH PROPOFOL N/A 10/02/2021   Procedure: COLONOSCOPY WITH PROPOFOL;  Surgeon: Crystal Landsman, MD;  Location: [name];  Service: Gastroenterology;  Laterality: N/A;   Coronary CT angiogram  01/03/2022   Thoracic aortic atherosclerosis.  Coronary Calcium Score 1.29.  Minimal proximal LAD calcification. (<25%).   ESOPHAGOGASTRODUODENOSCOPY N/A 10/02/2021   Procedure: ESOPHAGOGASTRODUODENOSCOPY (EGD);  Surgeon: Crystal Landsman, MD;  Location: [name];  Service: Gastroenterology;  Laterality: N/A;   TRANSTHORACIC ECHOCARDIOGRAM  09/29/2015   EF 55-60%.  No R WMA.  GR 1 DD.  Is also normal valves.  Normal study.   TRANSTHORACIC ECHOCARDIOGRAM  01/09/2022   EF 60 to 65%.  Mild to moderate LVH.  G1 DD.  Normal RV size and function.  Normal atrial sizes.  Normal  valves.  NORMAL ECHO   TUBAL LIGATION      Immunization History  Administered Date(s) Administered   Influenza,inj,Quad PF,6+ Mos 09/29/2015   Moderna Sars-Covid-2 Vaccination 03/19/2021, 04/18/2021   Pneumococcal Polysaccharide-23 09/29/2015    MEDICATIONS/ALLERGIES   Current Meds  Medication Sig   acetaminophen (TYLENOL) 325 MG tablet Take 2 tablets (650 mg total) by mouth every 6 (six) hours as needed for mild pain or fever.   albuterol (PROVENTIL) (2.5 MG/3ML) 0.083% nebulizer solution Take 3 mLs (2.5 mg total) by nebulization every 2 (two) hours as needed for wheezing or shortness of breath.   albuterol (VENTOLIN HFA) 108 (90 Base) MCG/ACT inhaler Inhale 2 puffs into the lungs every 6 (six) hours as needed for wheezing or shortness of breath.   aspirin EC 81 MG tablet Take 81 mg by mouth daily.   atorvastatin (LIPITOR) 10 MG tablet Take 10 mg by mouth daily.   budesonide-formoterol (SYMBICORT) 160-4.5 MCG/ACT inhaler Inhale 2 puffs into the lungs 2 (two) times daily.   Cholecalciferol (VITAMIN D3) 1.25 MG (50000 UT) CAPS Take 1 capsule by mouth once a week.   cyclobenzaprine (FLEXERIL) 5 MG tablet  TAKE 1 TABLET BY MOUTH NIGHTLY AS NEEDEDFOR MUSCLE SPASMS   diclofenac sodium (VOLTAREN) 1 % GEL Apply 2 g topically 4 (four) times daily as needed (pain).   DULoxetine (CYMBALTA) 20 MG capsule Take 20 mg by mouth daily.   fluticasone-salmeterol (ADVAIR DISKUS) 250-50 MCG/ACT AEPB Inhale 1 puff into the lungs in the morning and at bedtime.   gabapentin (NEURONTIN) 600 MG tablet Take by mouth 2 (two) times daily.   glipiZIDE (GLUCOTROL) 5 MG tablet Take 1 tablet (5 mg total) by mouth every morning.   HYDROcodone-acetaminophen (NORCO) 5-325 MG tablet Take 1 tablet by mouth every 6 (six) hours as needed for moderate pain.   lidocaine (LIDODERM) 5 % Place 1 patch onto the skin daily. Remove & Discard patch within 12 hours or as directed by MD   linaclotide (LINZESS) 290 MCG CAPS capsule Take  1 capsule (290 mcg total) by mouth daily before breakfast.   loratadine (CLARITIN) 10 MG tablet Take 1 tablet (10 mg total) by mouth daily.   losartan (COZAAR) 50 MG tablet Take 1 tablet (50 mg total) by mouth daily.   metFORMIN (GLUCOPHAGE) 1000 MG tablet Take 1 tablet (1,000 mg total) by mouth 2 (two) times daily with a meal. See discharge instructions for how to take medicine at first   metoprolol tartrate (LOPRESSOR) 50 MG tablet Take 50 mg by mouth 2 (two) times daily.   naproxen (NAPROSYN) 500 MG tablet Take 1 tablet (500 mg total) by mouth 2 (two) times daily.   omeprazole (PRILOSEC) 40 MG capsule Take 1 capsule (40 mg total) by mouth daily.   oxyCODONE (ROXICODONE) 5 MG immediate release tablet Take 1 tablet (5 mg total) by mouth every 4 (four) hours as needed for severe pain.   polyethylene glycol (MIRALAX / GLYCOLAX) 17 g packet Take 17 g by mouth 2 (two) times daily.   Semaglutide (RYBELSUS) 7 MG TABS Take by mouth daily.   sertraline (ZOLOFT) 50 MG tablet Take 1.5 tablets (75 mg total) by mouth every morning.   traMADol (ULTRAM) 50 MG tablet Take by mouth every 6 (six) hours as needed.    No Known Allergies  SOCIAL HISTORY/FAMILY HISTORY   Reviewed in Epic:  Pertinent findings:  Social History   Tobacco Use   Smoking status: Former    Packs/day: 3.00    Years: 10.00    Pack years: 30.00    Types: Cigarettes    Quit date: 12/22/2004    Years since quitting: 17.2   Smokeless tobacco: Never  Vaping Use   Vaping Use: Never used  Substance Use Topics   Alcohol use: No   Drug use: No   Social History   Social History Narrative   [facility] (07/20/17):   No bird or mold exposure. No recent travel.          [place] - urology    [facility] - nuerology     [facility] -PE, EKG, labs   Wt Readings from Last 3 Encounters:  02/06/22 158 lb 6 oz (71.8 kg)  01/31/22 173 lb (78.5 kg)  01/27/22 173 lb (78.5  kg)    Physical Exam: BP (!) 142/90 (BP Location: Left Arm, Patient Position: Sitting, Cuff Size: Normal)    Pulse 66    Ht 5' 1.5" (1.562 m)    Wt 158 lb 6 oz (71.8 kg)    SpO2 97%  BMI 29.44 kg/m  Physical Exam Vitals reviewed.  Constitutional:      General: She is not in acute distress.    Appearance: Normal appearance.     Comments: Borderline obese.  Well-nourished well-groomed  HENT:     Head: Normocephalic and atraumatic.  Neck:     Vascular: No carotid bruit or JVD.  Cardiovascular:     Rate and Rhythm: Normal rate and regular rhythm. No extrasystoles are present.    Chest Wall: PMI is not displaced.     Pulses: Normal pulses.     Heart sounds: S1 normal and S2 normal. Murmur (Soft 1/6 SEM) heard.    No friction rub. No gallop.  Pulmonary:     Effort: Pulmonary effort is normal. No respiratory distress.     Breath sounds: Normal breath sounds. No wheezing, rhonchi or rales.  Chest:     Chest wall: No tenderness.  Musculoskeletal:        General: No swelling. Normal range of motion.     Cervical back: Normal range of motion and neck supple.  Skin:    General: Skin is warm and dry.  Neurological:     General: No focal deficit present.     Mental Status: She is alert and oriented to person, place, and time. Mental status is at baseline.     Gait: Gait normal.  Psychiatric:        Mood and Affect: Mood normal.        Behavior: Behavior normal.        Thought Content: Thought content normal.        Judgment: Judgment normal.     Adult ECG Report  Rate: 66 ;  Rhythm: normal sinus rhythm, premature atrial contractions (PAC), and otherwise normal axis, intervals and durations. ;   Narrative Interpretation: Stable  Recent Labs:    Lab Results  Component Value Date   CHOL 189 09/29/2015   HDL 47 09/29/2015   LDLCALC 121 (H) 09/29/2015   TRIG 107 09/29/2015   CHOLHDL 4.0 09/29/2015   Lab Results  Component Value Date   CREATININE 0.73 01/31/2022   BUN 19  01/31/2022   NA 139 01/31/2022   K 4.5 01/31/2022   CL 104 01/31/2022   CO2 28 01/31/2022   CBC Latest Ref Rng & Units 01/31/2022 09/17/2021 05/13/2020  WBC 4.0 - 10.5 K/uL 6.8 5.9 7.9  Hemoglobin 12.0 - 15.0 g/dL 12.1 14.7 12.1  Hematocrit 36.0 - 46.0 % 38.1 44.4 37.4  Platelets 150 - 400 K/uL 230 251 213    Lab Results  Component Value Date   HGBA1C 7.8 (H) 09/17/2021   Lab Results  Component Value Date   TSH 1.060 09/17/2021    ==================================================  COVID-19 Education: The signs and symptoms of COVID-19 were discussed with the patient and how to seek care for testing (follow up with PCP or arrange E-visit).    I spent a total of 16 minutes with the patient spent in direct patient consultation.  Additional time spent with chart review  / charting (studies, outside notes, etc): 16 min Total Time: 32 min  Current medicines are reviewed at length with the patient today.  (+/- concerns) none  This visit occurred during the SARS-CoV-2 public health emergency.  Safety protocols were in place, including screening questions prior to the visit, additional usage of staff PPE, and extensive cleaning of exam room while observing appropriate contact time as indicated for disinfecting solutions.  Notice: This dictation was  prepared with Dragon dictation along with Engineer, materials. Any transcriptional errors that result from this process are unintentional and may not be corrected upon review.  Studies Ordered:  Orders Placed This Encounter  Procedures   EKG 12-Lead    Patient Instructions / Medication Changes & Studies & Tests Ordered   Patient Instructions  Medication Instructions:  Your physician has recommended you make the following change in your medication:   INCREASE morning dose of metoprolol tartrate (Lopressor) to 100 mg (2 tablets)   You may take an extra 25 mg (1/2 tablet) as needed daily for palpitations   *If you need a refill  on your cardiac medications before your next appointment, please call your pharmacy*   Lab Work: None ordered  If you have labs (blood work) drawn today and your tests are completely normal, you will receive your results only by: [place] (if you have MyChart) OR A paper copy in the mail If you have any lab test that is abnormal or we need to change your treatment, we will call you to review the results.   Testing/Procedures: None ordered   Follow-Up: At [name], you and your health needs are our priority.  As part of our continuing mission to provide you with exceptional heart care, we have created designated Provider Care Teams.  These Care Teams include your primary Cardiologist (physician) and Advanced Practice Providers (APPs -  Physician Assistants and Nurse Practitioners) who all work together to provide you with the care you need, when you need it.  We recommend signing up for the patient portal called "MyChart".  Sign up information is provided on this After Visit Summary.  MyChart is used to connect with patients for Virtual Visits (Telemedicine).  Patients are able to view lab/test results, encounter notes, upcoming appointments, etc.  Non-urgent messages can be sent to your provider as well.   To learn more about what you can do with MyChart, go to [email or web address].    Your next appointment:   6 month(s)  The format for your next appointment:   In Person  Provider:   You may see Glenetta Hew, MD or one of the following Advanced Practice Providers on your designated Care Team:   Murray Hodgkins, NP Christell Faith, PA-C Cadence Kathlen Mody, PA-C1}    Other Instructions N/A       Glenetta Hew, M.D., M.S. Interventional Cardiologist   Pager # [phone] Phone # [phone] [address]. [place], [place] [address]   Thank you for choosing [name]!!

## 2022-02-06 NOTE — Patient Instructions (Signed)
Medication Instructions:  Your physician has recommended you make the following change in your medication:   INCREASE morning dose of metoprolol tartrate (Lopressor) to 100 mg (2 tablets)   You may take an extra 25 mg (1/2 tablet) as needed daily for palpitations   *If you need a refill on your cardiac medications before your next appointment, please call your pharmacy*   Lab Work: None ordered  If you have labs (blood work) drawn today and your tests are completely normal, you will receive your results only by: [place] (if you have MyChart) OR A paper copy in the mail If you have any lab test that is abnormal or we need to change your treatment, we will call you to review the results.   Testing/Procedures: None ordered   Follow-Up: At [facility], you and your health needs are our priority.  As part of our continuing mission to provide you with exceptional heart care, we have created designated Provider Care Teams.  These Care Teams include your primary Cardiologist (physician) and Advanced Practice Providers (APPs -  Physician Assistants and Nurse Practitioners) who all work together to provide you with the care you need, when you need it.  We recommend signing up for the patient portal called "MyChart".  Sign up information is provided on this After Visit Summary.  MyChart is used to connect with patients for Virtual Visits (Telemedicine).  Patients are able to view lab/test results, encounter notes, upcoming appointments, etc.  Non-urgent messages can be sent to your provider as well.   To learn more about what you can do with MyChart, go to [email or web address].    Your next appointment:   6 month(s)  The format for your next appointment:   In Person  Provider:   You may see Glenetta Hew, MD or one of the following Advanced Practice Providers on your designated Care Team:   Murray Hodgkins, NP Christell Faith, PA-C Cadence Kathlen Mody, PA-C1}    Other  Instructions N/A

## 2022-02-24 ENCOUNTER — Ambulatory Visit: Payer: 59 | Admitting: Obstetrics and Gynecology

## 2022-02-27 ENCOUNTER — Ambulatory Visit: Payer: 59 | Admitting: Obstetrics and Gynecology

## 2022-03-01 ENCOUNTER — Encounter: Payer: Self-pay | Admitting: Cardiology

## 2022-03-01 NOTE — Assessment & Plan Note (Signed)
Borderline elevated blood pressure today. ? ?Plan: Increase morning dose of Lopressor to 100 mg and continue 50 mg in evening.  Still has room to titrate up losartan if necessary. ?

## 2022-03-01 NOTE — Assessment & Plan Note (Signed)
Unfortunately have labs available.  She is on atorvastatin with labs being followed by PCP. ? ?On combination of glipizide, metformin and semaglutide. ?

## 2022-03-01 NOTE — Assessment & Plan Note (Signed)
Well-controlled.  Short little tachycardia spells on the monitor.  Nothing significant no sustained arrhythmias.  She did have occasional PACs which are somewhat symptomatic. ? ?Plan: Increase daytime dose of metoprolol to 100 mg and continue p.m. dose of 50.  Okay to use additional 1 g as needed for palpitations. ? ?Maintain adequate hydration and reduce stress. ?

## 2022-04-02 ENCOUNTER — Other Ambulatory Visit: Payer: Self-pay | Admitting: Internal Medicine

## 2022-04-02 ENCOUNTER — Ambulatory Visit
Admission: RE | Admit: 2022-04-02 | Discharge: 2022-04-02 | Disposition: A | Discharge status: Home / Self Care | Payer: 59 | Source: Ambulatory Visit | Attending: Internal Medicine | Admitting: Internal Medicine

## 2022-04-02 ENCOUNTER — Ambulatory Visit
Admission: RE | Admit: 2022-04-02 | Discharge: 2022-04-02 | Disposition: A | Discharge status: Home / Self Care | Payer: 59 | Attending: Internal Medicine | Admitting: Internal Medicine

## 2022-04-02 DIAGNOSIS — M25562 Pain in left knee: Secondary | ICD-10-CM

## 2022-04-09 ENCOUNTER — Telehealth: Payer: Self-pay | Admitting: Cardiology

## 2022-04-09 NOTE — Telephone Encounter (Signed)
Patient called stating she needs some type of letter for disability stating she can't work because of her heart problems.  ?

## 2022-04-09 NOTE — Telephone Encounter (Signed)
To Dr. Ellyn Hack to review.  ? ?Per recent office note- the patient has been followed by Dr. Ellyn Hack for hypertension, hyperlipidemia, and palpitations. ? ?Recent testing includes: ? ?1) Cardiac CT (01/03/22):  ?Coronary CTA angiogram shows relatively low coronary calcium score of 1.29.  Very low risk.  Minimal coronary artery disease plaque in the LAD only.  CAD RADS score 1. ?Aortic atherosclerosis confirmed ? ? ?2) Echocardiogram (01/09/22):  ?EF 77-82 %, mild diastolic dysfunction ? ? ?3) 1 week ZIO monitor: ?Patient had a min HR of 38 bpm, max HR of 162 bpm, and avg HR of 80 bpm. Predominant underlying rhythm was Sinus Rhythm. 5 Supraventricular Tachycardia runs occurred, the run with the fastest interval lasting 5 beats with a max rate of 162 bpm, the  ?longest lasting 17.2 secs with an avg rate of 119 bpm. Isolated SVEs were frequent (5.3%, 40447), SVE Couplets were rare (<1.0%, 454), and SVE Triplets were rare (<1.0%, 70). Isolated VEs were rare (<1.0%), and no VE Couplets or VE Triplets were present.  ? ? ?I am not certain, that based on recent cardiac testing, a letter for disability related her to heart will be able to be provided. ? ?Await MD recommendations/ response.  ? ?

## 2022-04-09 NOTE — Telephone Encounter (Signed)
I completely agree with Nira Conn.  All of these evaluations are essentially normal. ? ?Based on the results, I do not think that I could write a letter for disability saying that there is anything wrong with the heart. ? ?Glenetta Hew, MD ? ?

## 2022-04-10 NOTE — Telephone Encounter (Signed)
Attempted to contact the patient x 2 attempts. ?Both times the message advised the "call could not be completed as dialed." ? ?Will attempt to call the patient back at a later time.  ?

## 2022-04-10 NOTE — Telephone Encounter (Signed)
Attempted to call the patient. ?"Call cannot be completed as dialed." ? ?Will attempt to call again at a later time.  ?

## 2022-04-14 ENCOUNTER — Other Ambulatory Visit: Payer: Self-pay | Admitting: Internal Medicine

## 2022-04-14 ENCOUNTER — Encounter: Payer: Self-pay | Admitting: Obstetrics and Gynecology

## 2022-04-14 ENCOUNTER — Ambulatory Visit: Payer: 59 | Admitting: Obstetrics and Gynecology

## 2022-04-14 VITALS — BP 140/82 | Ht 61.0 in | Wt 155.0 lb

## 2022-04-14 DIAGNOSIS — R32 Unspecified urinary incontinence: Secondary | ICD-10-CM

## 2022-04-14 DIAGNOSIS — Z4689 Encounter for fitting and adjustment of other specified devices: Secondary | ICD-10-CM

## 2022-04-14 DIAGNOSIS — Z01419 Encounter for gynecological examination (general) (routine) without abnormal findings: Secondary | ICD-10-CM

## 2022-04-14 DIAGNOSIS — S40022A Contusion of left upper arm, initial encounter: Secondary | ICD-10-CM

## 2022-04-14 NOTE — Telephone Encounter (Signed)
Attempted to call the patient. ?No answer- I left a detailed message on the patient's identified (ok per DPR), of Dr. Allison Quarry response as stated below. ? ?I asked that she please call back with any further questions/ concerns. ? ? ?

## 2022-04-14 NOTE — Progress Notes (Signed)
Subjective:  ?  ? [name] is a 56 y.o. female P3 postmenopausal here for pessary maintenance. The patient reports no problems. Patient denies pelvic pain or urinary incontinence. She is without any complaints. She is current with her health maintenance ? ?Past Medical History:  ?Diagnosis Date  ? Acute diverticulitis   ? Acute pain of right knee 10/14/2016  ? Acute renal failure (ARF) ([place]) 01/26/2019  ? Chronic back pain   ? Complicated by neuropathy. ->  On Neurontin and duloxetine along with Flexeril and Voltaren gel.  Also uses PRN tramadol.  ? COPD with asthma ([place])   ? On combination of albuterol Qvar and Symbicort  ? Depression   ? Diabetes mellitus without complication ([place])   ? Diverticulitis large intestine 05/12/2020  ? Hypertension   ? ?Past Surgical History:  ?Procedure Laterality Date  ? 7-Day Zio Patch Monitor  12/2021  ? Predominant sinus rhythm.  Rate range 38-136 bpm with an average of 80 bpm (bradycardia only noted during hours of sleep.  5 Atrial Runs-fastest was 5 beats at a max rate 162 bpm, longest was 17.2 seconds/33 beats at a rate of 119 bpm--not noted on patient trigger.  Frequent PACs noted (5.3%).  Rare PVCs.  ? ABDOMINAL HYSTERECTOMY    ? COLONOSCOPY WITH PROPOFOL N/A 10/02/2021  ? Procedure: COLONOSCOPY WITH PROPOFOL;  Surgeon: Lin Landsman, MD;  Location: [facility] ENDOSCOPY;  Service: Gastroenterology;  Laterality: N/A;  ? Coronary CT angiogram  01/03/2022  ? Thoracic aortic atherosclerosis.  Coronary Calcium Score 1.29.  Minimal proximal LAD calcification. (<25%).  ? ESOPHAGOGASTRODUODENOSCOPY N/A 10/02/2021  ? Procedure: ESOPHAGOGASTRODUODENOSCOPY (EGD);  Surgeon: Lin Landsman, MD;  Location: [facility] ENDOSCOPY;  Service: Gastroenterology;  Laterality: N/A;  ? TRANSTHORACIC ECHOCARDIOGRAM  09/29/2015  ? EF 55-60%.  No R WMA.  GR 1 DD.  Is also normal valves.  Normal study.  ? TRANSTHORACIC ECHOCARDIOGRAM  01/09/2022  ? EF 60 to 65%.  Mild to moderate LVH.  G1 DD.   Normal RV size and function.  Normal atrial sizes.  Normal valves.  NORMAL ECHO  ? TUBAL LIGATION    ? ?Family History  ?Problem Relation Age of Onset  ? Hypertension Mother   ? Other Other   ? ? ? ?Social History  ? ?Socioeconomic History  ? Marital status: Single  ?  Spouse name: Not on file  ? Number of children: Not on file  ? Years of education: Not on file  ? Highest education level: Not on file  ?Occupational History  ? Not on file  ?Tobacco Use  ? Smoking status: Former  ?  Packs/day: 3.00  ?  Years: 10.00  ?  Pack years: 30.00  ?  Types: Cigarettes  ?  Quit date: 12/22/2004  ?  Years since quitting: 17.3  ? Smokeless tobacco: Never  ?Vaping Use  ? Vaping Use: Never used  ?Substance and Sexual Activity  ? Alcohol use: No  ? Drug use: No  ? Sexual activity: Not Currently  ?Other Topics Concern  ? Not on file  ?Social History Narrative  ? [facility] (07/20/17):  ? No bird or mold exposure. No recent travel.   ?   ?   ? Rohini Vanga - GI   ? Charlesetta Garibaldi - Obgyn   ? Hollice Espy - urology   ? Hemang Manuella Ghazi - nuerology   ? ?Social Determinants of Health  ? ?Financial Resource Strain: Not on file  ?Food Insecurity: Not on  file  ?Transportation Needs: Not on file  ?Physical Activity: Not on file  ?Stress: Not on file  ?Social Connections: Not on file  ?Intimate Partner Violence: Not on file  ? ?Health Maintenance  ?Topic Date Due  ? OPHTHALMOLOGY EXAM  Never done  ? Hepatitis C Screening  Never done  ? TETANUS/TDAP  Never done  ? Zoster Vaccines- Shingrix (1 of 2) Never done  ? PAP SMEAR-Modifier  Never done  ? FOOT EXAM  01/06/2017  ? COVID-19 Vaccine (3 - Moderna risk series) 05/16/2021  ? HEMOGLOBIN A1C  03/17/2022  ? INFLUENZA VACCINE  07/22/2022  ? COLONOSCOPY (Pts 45-34yr Insurance coverage will need to be confirmed)  10/02/2022  ? MAMMOGRAM  11/30/2023  ? HIV Screening  Completed  ? HPV VACCINES  Aged Out  ? ? ?  ? ?Review of Systems ?Pertinent items noted in HPI and remainder of comprehensive  ROS otherwise negative.  ? ?Objective:  ?Blood pressure 140/82, height '5\' 1"'$  (1.549 m), weight 155 lb (70.3 kg). ? ? GENERAL: Well-developed, well-nourished female in no acute distress.  ?ABDOMEN: Soft, nontender, nondistended. No organomegaly. ?PELVIC: Normal external female genitalia. Vagina is pink and rugated without evidence of ulceration.  Normal discharge. Vaginal vault intact. No adnexal mass or tenderness. Chaperone present during the pelvic exam ?EXTREMITIES: No cyanosis, clubbing, or edema, 2+ distal pulses. ?  ?  ?Assessment:  ? ? Healthy female exam.    ?  ?Plan:  ? ? Pap smear not indicated ?Pessary cleaned and replaced without difficulty or discomfort ?Patient desires to discuss surgical options for her incontinence- will refer to urogynecology ?Patient due for mammogram in December ?RTC in 3 months for pessary cleaning ?See After Visit Summary for Counseling Recommendations  ? ?

## 2022-04-22 ENCOUNTER — Ambulatory Visit
Admission: RE | Admit: 2022-04-22 | Discharge: 2022-04-22 | Disposition: A | Discharge status: Home / Self Care | Payer: 59 | Source: Ambulatory Visit | Attending: Internal Medicine | Admitting: Internal Medicine

## 2022-04-22 DIAGNOSIS — S40022A Contusion of left upper arm, initial encounter: Secondary | ICD-10-CM | POA: Diagnosis present

## 2022-04-23 ENCOUNTER — Ambulatory Visit: Payer: 59 | Admitting: Podiatry

## 2022-05-07 ENCOUNTER — Ambulatory Visit (INDEPENDENT_AMBULATORY_CARE_PROVIDER_SITE_OTHER): Payer: Self-pay | Admitting: Podiatry

## 2022-05-07 DIAGNOSIS — Z91199 Patient's noncompliance with other medical treatment and regimen due to unspecified reason: Secondary | ICD-10-CM

## 2022-05-08 ENCOUNTER — Encounter: Payer: Self-pay | Admitting: Podiatry

## 2022-05-08 NOTE — Progress Notes (Signed)
Patient was no-show for appointment today 

## 2022-06-04 ENCOUNTER — Encounter: Payer: Self-pay | Admitting: Surgery

## 2022-06-04 ENCOUNTER — Other Ambulatory Visit: Payer: Self-pay

## 2022-06-04 ENCOUNTER — Ambulatory Visit: Payer: Self-pay | Admitting: Surgery

## 2022-06-04 VITALS — BP 169/94 | HR 92 | Temp 98.0°F | Ht 61.5 in | Wt 149.6 lb

## 2022-06-04 DIAGNOSIS — D1722 Benign lipomatous neoplasm of skin and subcutaneous tissue of left arm: Secondary | ICD-10-CM

## 2022-06-04 DIAGNOSIS — R2232 Localized swelling, mass and lump, left upper limb: Secondary | ICD-10-CM | POA: Diagnosis not present

## 2022-06-04 NOTE — Patient Instructions (Addendum)
MR scheduled 06/16/2022 @ [place] arrive at 3:30. [facility] entrance. We will call you with the results.   Lipoma  A lipoma is a noncancerous (benign) tumor that is made up of fat cells. This is a very common type of soft-tissue growth. Lipomas are usually found under the skin (subcutaneous). They may occur in any tissue of the body that contains fat. Common areas for lipomas to appear include the back, arms, shoulders, buttocks, and thighs. Lipomas grow slowly, and they are usually painless. Most lipomas do not cause problems and do not require treatment. What are the causes? The cause of this condition is not known. What increases the risk? You are more likely to develop this condition if: You are 87-62 years old. You have a family history of lipomas. What are the signs or symptoms? A lipoma usually appears as a small, round bump under the skin. In most cases, the lump will: Feel soft or rubbery. Not cause pain or other symptoms. However, if a lipoma is located in an area where it pushes on nerves, it can become painful or cause other symptoms. How is this diagnosed? A lipoma can usually be diagnosed with a physical exam. You may also have tests to confirm the diagnosis and to rule out other conditions. Tests may include: Imaging tests, such as a CT scan or an MRI. Removal of a tissue sample to be looked at under a microscope (biopsy). How is this treated? Treatment for this condition depends on the size of the lipoma and whether it is causing any symptoms. For small lipomas that are not causing problems, no treatment is needed. If a lipoma is bigger or it causes problems, surgery may be done to remove the lipoma. Lipomas can also be removed to improve appearance. Most often, the procedure is done after applying a medicine that numbs the area (local anesthetic). Liposuction may be done to reduce the size of the lipoma before it is removed through surgery, or it may be done to remove the  lipoma. Lipomas are removed with this method to limit incision size and scarring. A liposuction tube is inserted through a small incision into the lipoma, and the contents of the lipoma are removed through the tube with suction. Follow these instructions at home: Watch your lipoma for any changes. Keep all follow-up visits. This is important. Where to find more information OrthoInfo: orthoinfo.aaos.org Contact a health care provider if: Your lipoma becomes larger or hard. Your lipoma becomes painful, red, or increasingly swollen. These could be signs of infection or a more serious condition. Get help right away if: You develop tingling or numbness in an area near the lipoma. This could indicate that the lipoma is causing nerve damage. Summary A lipoma is a noncancerous tumor that is made up of fat cells. Most lipomas do not cause problems and do not require treatment. If a lipoma is bigger or it causes problems, surgery may be done to remove the lipoma. Contact a health care provider if your lipoma becomes larger or hard, or if it becomes painful, red, or increasingly swollen. These could be signs of infection or a more serious condition. This information is not intended to replace advice given to you by your health care provider. Make sure you discuss any questions you have with your health care provider. Document Revised: 12/27/2021 Document Reviewed: 12/27/2021 Elsevier Patient Education  [place].

## 2022-06-04 NOTE — Progress Notes (Signed)
06/04/2022  Reason for Visit:  Left upper arm lipoma  Requesting Provider:  [name], MD  History of Present Illness: [name] is a 56 y.o. female presenting for evaluation of a left upper arm lipoma.  The patient reports that she fell in 2017 in the tub and landed on her left side, injuring her ribs and left arm.  She has had issues since then with pain in her left arm and shoulder, with difficulty with range of motion and cramping in her hand.  She has also noticed a lump in her left biceps, and reports that recently she feels it has been growing and also becoming more painful.  She had an ultrasound of the left upper arm on 04/22/22 which showed a 5.3 x 3.2 x 2.2 cm mass possibly being a lipoma.  On my evaluation of the images, I cannot discern if the mass is truly intramuscular or if it's outside the fascia of the bicep muscle.  Patient denies any issues on the right side, and denies other bumps/lumps.  Per notes from her PCP, her diabetes is not well controlled and her last A1c was 8.6.  The patient also has been evaluated by Neurology and Orthopedics for left arm pain and shoulder pain.  She had an MRI of the left shoulder in 04/2020 showing complete tear of the long head of the biceps at the bicipital interval.    On chart review, I do not see an ED visit for a fall in 2017 or imaging showing broken ribs.  She had been seen by Dr. Celine Ahr for possible hematoma/bruising of the left upper arm in 02/2020 after a fall in the shower.  Past Medical History: Past Medical History:  Diagnosis Date   Acute diverticulitis    Acute pain of right knee 10/14/2016   Acute renal failure (ARF) ([place]) 01/26/2019   Chronic back pain    Complicated by neuropathy. ->  On Neurontin and duloxetine along with Flexeril and Voltaren gel.  Also uses PRN tramadol.   COPD with asthma ([place])    On combination of albuterol Qvar and Symbicort   Depression    Diabetes mellitus without complication ([place])     Diverticulitis large intestine 05/12/2020   Hypertension      Past Surgical History: Past Surgical History:  Procedure Laterality Date   7-Day Zio Patch Monitor  12/2021   Predominant sinus rhythm.  Rate range 38-136 bpm with an average of 80 bpm (bradycardia only noted during hours of sleep.  5 Atrial Runs-fastest was 5 beats at a max rate 162 bpm, longest was 17.2 seconds/33 beats at a rate of 119 bpm--not noted on patient trigger.  Frequent PACs noted (5.3%).  Rare PVCs.   ABDOMINAL HYSTERECTOMY     COLONOSCOPY WITH PROPOFOL N/A 10/02/2021   Procedure: COLONOSCOPY WITH PROPOFOL;  Surgeon: Lin Landsman, MD;  Location: [facility] ENDOSCOPY;  Service: Gastroenterology;  Laterality: N/A;   Coronary CT angiogram  01/03/2022   Thoracic aortic atherosclerosis.  Coronary Calcium Score 1.29.  Minimal proximal LAD calcification. (<25%).   ESOPHAGOGASTRODUODENOSCOPY N/A 10/02/2021   Procedure: ESOPHAGOGASTRODUODENOSCOPY (EGD);  Surgeon: Lin Landsman, MD;  Location: [facility] ENDOSCOPY;  Service: Gastroenterology;  Laterality: N/A;   TRANSTHORACIC ECHOCARDIOGRAM  09/29/2015   EF 55-60%.  No R WMA.  GR 1 DD.  Is also normal valves.  Normal study.   TRANSTHORACIC ECHOCARDIOGRAM  01/09/2022   EF 60 to 65%.  Mild to moderate LVH.  G1 DD.  Normal RV  size and function.  Normal atrial sizes.  Normal valves.  NORMAL ECHO   TUBAL LIGATION      Home Medications: Prior to Admission medications   Medication Sig Start Date End Date Taking? Authorizing Provider  acetaminophen (TYLENOL) 325 MG tablet Take 2 tablets (650 mg total) by mouth every 6 (six) hours as needed for mild pain or fever. 05/13/20   Lorella Nimrod, MD  albuterol (PROVENTIL) (2.5 MG/3ML) 0.083% nebulizer solution Take 3 mLs (2.5 mg total) by nebulization every 2 (two) hours as needed for wheezing or shortness of breath. 01/28/19   Lule, Sara Chu, PA  albuterol (VENTOLIN HFA) 108 (90 Base) MCG/ACT inhaler Inhale 2 puffs into the lungs every 6 (six)  hours as needed for wheezing or shortness of breath. 01/01/22   Tyler Pita, MD  aspirin EC 81 MG tablet Take 81 mg by mouth daily.    [provider]  atorvastatin (LIPITOR) 10 MG tablet Take 10 mg by mouth daily.    [provider]  budesonide-formoterol (SYMBICORT) 160-4.5 MCG/ACT inhaler Inhale 2 puffs into the lungs 2 (two) times daily. 01/01/22   Tyler Pita, MD  Cholecalciferol (VITAMIN D3) 1.25 MG (50000 UT) CAPS Take 1 capsule by mouth once a week. 02/09/20   [provider]  cyclobenzaprine (FLEXERIL) 5 MG tablet TAKE 1 TABLET BY MOUTH NIGHTLY AS NEEDEDFOR MUSCLE SPASMS 11/01/20   [provider]  diclofenac sodium (VOLTAREN) 1 % GEL Apply 2 g topically 4 (four) times daily as needed (pain). 01/28/19   Ripley Fraise, PA  DULoxetine (CYMBALTA) 20 MG capsule Take 20 mg by mouth daily. 07/11/21 07/11/22  [provider]  fluticasone-salmeterol (ADVAIR DISKUS) 250-50 MCG/ACT AEPB Inhale 1 puff into the lungs in the morning and at bedtime. 01/06/22   Tyler Pita, MD  gabapentin (NEURONTIN) 600 MG tablet Take by mouth 2 (two) times daily. 09/17/20   [provider]  glipiZIDE (GLUCOTROL) 5 MG tablet Take 1 tablet (5 mg total) by mouth every morning. 01/28/19   Ripley Fraise, PA  HYDROcodone-acetaminophen (NORCO) 5-325 MG tablet Take 1 tablet by mouth every 6 (six) hours as needed for moderate pain. 01/27/22   Duanne Guess, PA-C  lidocaine (LIDODERM) 5 % Place 1 patch onto the skin daily. Remove & Discard patch within 12 hours or as directed by MD 02/01/22   Maudie Flakes, MD  linaclotide Rolan Lipa) 290 MCG CAPS capsule Take 1 capsule (290 mcg total) by mouth daily before breakfast. 10/30/21 07/27/22  Lin Landsman, MD  loratadine (CLARITIN) 10 MG tablet Take 1 tablet (10 mg total) by mouth daily. 01/28/19   Ripley Fraise, PA  losartan (COZAAR) 50 MG tablet Take 1 tablet (50 mg total) by mouth daily. 01/28/19   Ripley Fraise, PA  metFORMIN  (GLUCOPHAGE) 1000 MG tablet Take 1 tablet (1,000 mg total) by mouth 2 (two) times daily with a meal. See discharge instructions for how to take medicine at first 01/28/19   Lule, Joana, PA  metoprolol tartrate (LOPRESSOR) 50 MG tablet Take 100 mg (2 tablets) in the morning and 50 mg (1 tablet) in the evening. You may take 25 mg (1/2) extra daily as needed for palpitations. 02/06/22   Leonie Man, MD  naproxen (NAPROSYN) 500 MG tablet Take 1 tablet (500 mg total) by mouth 2 (two) times daily. 02/01/22   Maudie Flakes, MD  omeprazole (PRILOSEC) 40 MG capsule Take 1 capsule (40 mg total) by mouth daily. 01/01/22  Tyler Pita, MD  oxyCODONE (ROXICODONE) 5 MG immediate release tablet Take 1 tablet (5 mg total) by mouth every 4 (four) hours as needed for severe pain. 02/01/22   Maudie Flakes, MD  polyethylene glycol (MIRALAX / GLYCOLAX) 17 g packet Take 17 g by mouth 2 (two) times daily. 05/13/20   Lorella Nimrod, MD  Semaglutide (RYBELSUS) 7 MG TABS Take by mouth daily.    [provider]  sertraline (ZOLOFT) 50 MG tablet Take 1.5 tablets (75 mg total) by mouth every morning. 01/28/19   Ripley Fraise, PA  traMADol (ULTRAM) 50 MG tablet Take by mouth every 6 (six) hours as needed.    [provider]    Allergies: No Known Allergies  Social History:  reports that she quit smoking about 17 years ago. Her smoking use included cigarettes. She has a 30.00 pack-year smoking history. She has never used smokeless tobacco. She reports that she does not drink alcohol and does not use drugs.   Family History: Family History  Problem Relation Age of Onset   Hypertension Mother    Other Other     Review of Systems: Review of Systems  Constitutional:  Negative for chills and fever.  HENT:  Negative for hearing loss.   Respiratory:  Negative for shortness of breath.   Cardiovascular:  Negative for chest pain.  Gastrointestinal:  Negative for abdominal pain, nausea and vomiting.   Musculoskeletal:  Positive for joint pain and myalgias.  Skin:  Negative for rash.    Physical Exam BP (!) 169/94   Pulse 92   Temp 98 F (36.7 C) (Oral)   Ht 5' 1.5" (1.562 m)   Wt 149 lb 9.6 oz (67.9 kg)   SpO2 98%   BMI 27.81 kg/m  CONSTITUTIONAL: No acute distress, well nourished. HEENT:  Normocephalic, atraumatic, extraocular motion intact.  RESPIRATORY:  Lungs are clear, and breath sounds are equal bilaterally. Normal respiratory effort without pathologic use of accessory muscles. CARDIOVASCULAR: Heart is regular without murmurs, gallops, or rubs. MUSCULOSKELETAL:  The patient has a 5 cm mass overlying the left mid biceps, which is more noticeable when she extends her arm.  It feels somewhat firm and has some tenderness to palpation.  No overlying skin changes or induration.  She has limited range of motion in her left shoulder, likely from her fall as mentioned. SKIN: Skin turgor is normal. There are no pathologic skin lesions.  NEUROLOGIC:  Motor and sensation is grossly normal.  Cranial nerves are grossly intact. PSYCH:  Alert and oriented to person, place and time. Affect is normal.  Laboratory Analysis: Labs from 01/31/22: Na 139, K 4.5, Cl 104, CO2 28, BUN 19, Cr 0.73, glucose 355.  Total bili 0.5, AST 26, ALT 31, Alk Phos 128.  WBC 6.8, Hgb 12.1, Hct 38.1, Plt 230.  Imaging: U/S LUE 04/22/22: IMPRESSION: Hyperechoic lesion in the region of concern has an appearance most consistent with a simple lipoma. If the area becomes painful or enlarges, MRI with and without contrast could be used for further evaluation.  Assessment and Plan: This is a 56 y.o. female with a possible left upper arm lipoma.  --Discussed with the patient that the mass she feels could possibly be a lipoma vs hematoma given the fall and injury she had.  It is unclear exactly when her fall was.  She also has had an MRI of the left shoulder which showed a complete tear of the long head of the biceps,  but  the palpable mass is more at mid biceps rather than proximal upper arm.  On evaluation of the ultrasound images, I also cannot discern if the mass is intramuscular or not.  I think it would be best to obtain an MRI of the upper arm to better evaluate this area and the anatomy.  Depending on the complexity of this, she may need referral to different surgical group, possibly orthopedics.  Discussed with Dr. Dahlia Byes as well who will evaluate the MRI images once done. --I will call the patient with results of the MRI and further instructions. --Patient is in agreement with this plan and all of her questions have been answered.  I spent 40 minutes dedicated to the care of this patient on the date of this encounter to include pre-visit review of records, face-to-face time with the patient discussing diagnosis and management, and any post-visit coordination of care.   Melvyn Neth, [facility]

## 2022-06-16 ENCOUNTER — Ambulatory Visit
Admission: RE | Admit: 2022-06-16 | Discharge: 2022-06-16 | Disposition: A | Discharge status: Home / Self Care | Payer: 59 | Source: Ambulatory Visit | Attending: Surgery | Admitting: Surgery

## 2022-06-16 DIAGNOSIS — D1722 Benign lipomatous neoplasm of skin and subcutaneous tissue of left arm: Secondary | ICD-10-CM | POA: Insufficient documentation

## 2022-06-16 MED ORDER — GADOBUTROL 1 MMOL/ML IV SOLN
6.0000 mL | Freq: Once | INTRAVENOUS | Status: AC | PRN
  Administered 2022-06-16: 6 mL via INTRAVENOUS

## 2022-06-17 ENCOUNTER — Ambulatory Visit: Payer: 59 | Admitting: Family

## 2022-06-17 ENCOUNTER — Ambulatory Visit (INDEPENDENT_AMBULATORY_CARE_PROVIDER_SITE_OTHER): Payer: 59

## 2022-06-17 DIAGNOSIS — M5442 Lumbago with sciatica, left side: Secondary | ICD-10-CM | POA: Diagnosis not present

## 2022-06-17 DIAGNOSIS — G8929 Other chronic pain: Secondary | ICD-10-CM

## 2022-06-17 MED ORDER — PREDNISONE 50 MG PO TABS: ORAL_TABLET | ORAL | 0 refills | Status: DC

## 2022-06-18 NOTE — Progress Notes (Signed)
Office Visit Note   Patient: [name]           Date of Birth: 1966/09/08           MRN: [id number] Visit Date: 06/17/2022              Requested by: [name], [address] [place],  [place] [address] PCP: [name], MD  Chief Complaint  Patient presents with   Lower Back - Pain   Left Knee - Pain      HPI: The patient is a 56 year old woman who presents today complaining of left knee pain as well as low back pain which is chronic.  She has been having lateral anterior knee pain this is worse with weightbearing.  She states she is also having difficulty sleeping due to the leg and knee pain.  Feels she has cold and hot intolerance in the left lower extremity.  She has had falls due to giving way of her knee  She does have a history of low back pain which has been ongoing since 2015.  She states [place] and [place] both of her knees and has had issues with her left knee and back since this fall  States is currently using gabapentin for pain she takes 2 in the morning and 2 in the afternoon as well as 2 at night she states this helps with sleep  Assessment & Plan: Visit Diagnoses:  1. Chronic left-sided low back pain with left-sided sciatica     Plan: Impression left left-sided lumbar radiculopathy.  We will place her on a prednisone burst.  Plan to follow-up with her in 3 to 4 weeks  Follow-Up Instructions: No follow-ups on file.   Left Knee Exam   Tenderness  The patient is experiencing tenderness in the lateral joint line (diffuse).  Tests  Varus: negative Valgus: negative  Other  Erythema: absent Effusion: no effusion present   Back Exam   Tenderness  The patient is experiencing no tenderness.   Muscle Strength  Right Quadriceps:  5/5  Left Quadriceps:  4/5  Right Hamstrings:  5/5  Left Hamstrings:  5/5   Tests  Straight leg raise left: positive      Patient is alert, oriented, no adenopathy,  well-dressed, normal affect, normal respiratory effort.   Imaging: No results found. No images are attached to the encounter.  MRI from 2020: L1-2: Negative   L2-3: Negative   L3-4: Small central disc protrusion without significant stenosis.   L4-5: Disc degeneration with mild disc bulging and mild facet and ligamentum flavum hypertrophy. Mild subarticular stenosis bilaterally. Spinal canal adequate in size   L5-S1: Mild facet degeneration.  Negative for stenosis.  Labs: Lab Results  Component Value Date   HGBA1C 7.8 (H) 09/17/2021   HGBA1C 9.6 (H) 05/13/2020   HGBA1C 10.7 (H) 01/27/2019   REPTSTATUS 05/16/2020 FINAL 05/11/2020   CULT  05/11/2020    NO GROWTH 5 DAYS Performed at [facility], [address] [place], [place] [address]      Lab Results  Component Value Date   ALBUMIN 3.8 01/31/2022   ALBUMIN 4.2 09/17/2021   ALBUMIN 4.0 05/11/2020    Lab Results  Component Value Date   MG 2.1 05/22/2017   No results found for: "VD25OH"  No results found for: "PREALBUMIN"    Latest Ref Rng & Units 01/31/2022   10:50 PM 09/17/2021    2:56 PM 05/13/2020  4:23 AM  CBC EXTENDED  WBC 4.0 - 10.5 K/uL 6.8  5.9  7.9   RBC 3.87 - 5.11 MIL/uL 3.99  4.92  4.17   Hemoglobin 12.0 - 15.0 g/dL 12.1  14.7  12.1   HCT 36.0 - 46.0 % 38.1  44.4  37.4   Platelets 150 - 400 K/uL 230  251  213   NEUT# 1.7 - 7.7 K/uL 4.6     Lymph# 0.7 - 4.0 K/uL 1.4        There is no height or weight on file to calculate BMI.  Orders:  Orders Placed This Encounter  Procedures   XR Lumbar Spine 2-3 Views   Meds ordered this encounter  Medications   predniSONE (DELTASONE) 50 MG tablet    Sig: Take one tablet by mouth once daily for 5 days.    Dispense:  5 tablet    Refill:  0     Procedures: No procedures performed  Clinical Data: No additional findings.  ROS:  All other systems negative, except as noted in the HPI. Review of Systems  Objective: Vital Signs:  There were no vitals taken for this visit.  Specialty Comments:  No specialty comments available.  PMFS History: Patient Active Problem List   Diagnosis Date Noted   Exercise intolerance 12/26/2021   Heart murmur 12/26/2021   DOE (dyspnea on exertion) 12/26/2021   Palpitations 12/26/2021   Abdominal bloating    Polyp of colon    Multiple adenomatous polyps    Depression    Hyperlipidemia associated with type 2 diabetes mellitus (HCC)    GERD (gastroesophageal reflux disease)    Influenza A 01/26/2019   Marijuana use 06/12/2017   Diabetic neuropathy ([place]) 01/07/2016   Plantar fasciitis of left foot 11/26/2015   Back pain 10/10/2015   Asthma 09/29/2015   Essential hypertension 09/29/2015   Diabetes mellitus without complication ([place]) 19/37/9024   Past Medical History:  Diagnosis Date   Acute diverticulitis    Acute pain of right knee 10/14/2016   Acute renal failure (ARF) ([place]) 01/26/2019   Chronic back pain    Complicated by neuropathy. ->  On Neurontin and duloxetine along with Flexeril and Voltaren gel.  Also uses PRN tramadol.   COPD with asthma ([place])    On combination of albuterol Qvar and Symbicort   Depression    Diabetes mellitus without complication ([place])    Diverticulitis large intestine 05/12/2020   Hypertension     Family History  Problem Relation Age of Onset   Hypertension Mother    Other Other     Past Surgical History:  Procedure Laterality Date   7-Day Zio Patch Monitor  12/2021   Predominant sinus rhythm.  Rate range 38-136 bpm with an average of 80 bpm (bradycardia only noted during hours of sleep.  5 Atrial Runs-fastest was 5 beats at a max rate 162 bpm, longest was 17.2 seconds/33 beats at a rate of 119 bpm--not noted on patient trigger.  Frequent PACs noted (5.3%).  Rare PVCs.   ABDOMINAL HYSTERECTOMY     COLONOSCOPY WITH PROPOFOL N/A 10/02/2021   Procedure: COLONOSCOPY WITH PROPOFOL;  Surgeon: Lin Landsman, MD;  Location: [facility] ENDOSCOPY;   Service: Gastroenterology;  Laterality: N/A;   Coronary CT angiogram  01/03/2022   Thoracic aortic atherosclerosis.  Coronary Calcium Score 1.29.  Minimal proximal LAD calcification. (<25%).   ESOPHAGOGASTRODUODENOSCOPY N/A 10/02/2021   Procedure: ESOPHAGOGASTRODUODENOSCOPY (EGD);  Surgeon: Lin Landsman, MD;  Location: [facility] ENDOSCOPY;  Service: Gastroenterology;  Laterality: N/A;   TRANSTHORACIC ECHOCARDIOGRAM  09/29/2015   EF 55-60%.  No R WMA.  GR 1 DD.  Is also normal valves.  Normal study.   TRANSTHORACIC ECHOCARDIOGRAM  01/09/2022   EF 60 to 65%.  Mild to moderate LVH.  G1 DD.  Normal RV size and function.  Normal atrial sizes.  Normal valves.  NORMAL ECHO   TUBAL LIGATION     Social History   Occupational History   Not on file  Tobacco Use   Smoking status: Former    Packs/day: 3.00    Years: 10.00    Total pack years: 30.00    Types: Cigarettes    Quit date: 12/22/2004    Years since quitting: 17.4   Smokeless tobacco: Never  Vaping Use   Vaping Use: Never used  Substance and Sexual Activity   Alcohol use: No   Drug use: No   Sexual activity: Not Currently

## 2022-06-20 ENCOUNTER — Emergency Department: Payer: 59

## 2022-06-20 ENCOUNTER — Emergency Department
Admission: EM | Admit: 2022-06-20 | Discharge: 2022-06-21 | Disposition: A | Discharge status: Home / Self Care | Payer: 59 | Attending: Emergency Medicine | Admitting: Emergency Medicine

## 2022-06-20 ENCOUNTER — Other Ambulatory Visit: Payer: Self-pay

## 2022-06-20 DIAGNOSIS — Y92410 Unspecified street and highway as the place of occurrence of the external cause: Secondary | ICD-10-CM | POA: Diagnosis not present

## 2022-06-20 DIAGNOSIS — J449 Chronic obstructive pulmonary disease, unspecified: Secondary | ICD-10-CM | POA: Insufficient documentation

## 2022-06-20 DIAGNOSIS — M546 Pain in thoracic spine: Secondary | ICD-10-CM | POA: Diagnosis not present

## 2022-06-20 DIAGNOSIS — Z7982 Long term (current) use of aspirin: Secondary | ICD-10-CM | POA: Insufficient documentation

## 2022-06-20 DIAGNOSIS — E041 Nontoxic single thyroid nodule: Secondary | ICD-10-CM | POA: Insufficient documentation

## 2022-06-20 DIAGNOSIS — Z7984 Long term (current) use of oral hypoglycemic drugs: Secondary | ICD-10-CM | POA: Diagnosis not present

## 2022-06-20 DIAGNOSIS — E1165 Type 2 diabetes mellitus with hyperglycemia: Secondary | ICD-10-CM | POA: Insufficient documentation

## 2022-06-20 DIAGNOSIS — R000 Tachycardia, unspecified: Secondary | ICD-10-CM | POA: Insufficient documentation

## 2022-06-20 DIAGNOSIS — M542 Cervicalgia: Secondary | ICD-10-CM | POA: Insufficient documentation

## 2022-06-20 DIAGNOSIS — M545 Low back pain, unspecified: Secondary | ICD-10-CM | POA: Insufficient documentation

## 2022-06-20 DIAGNOSIS — J45909 Unspecified asthma, uncomplicated: Secondary | ICD-10-CM | POA: Diagnosis not present

## 2022-06-20 DIAGNOSIS — Z79899 Other long term (current) drug therapy: Secondary | ICD-10-CM | POA: Diagnosis not present

## 2022-06-20 DIAGNOSIS — I10 Essential (primary) hypertension: Secondary | ICD-10-CM | POA: Diagnosis not present

## 2022-06-20 DIAGNOSIS — R1030 Lower abdominal pain, unspecified: Secondary | ICD-10-CM | POA: Insufficient documentation

## 2022-06-20 DIAGNOSIS — R079 Chest pain, unspecified: Secondary | ICD-10-CM | POA: Insufficient documentation

## 2022-06-20 DIAGNOSIS — S0990XA Unspecified injury of head, initial encounter: Secondary | ICD-10-CM | POA: Diagnosis present

## 2022-06-20 DIAGNOSIS — R944 Abnormal results of kidney function studies: Secondary | ICD-10-CM | POA: Diagnosis not present

## 2022-06-20 LAB — BASIC METABOLIC PANEL
Anion gap: 9 (ref 5–15)
BUN: 26 mg/dL — ABNORMAL HIGH (ref 6–20)
CO2: 21 mmol/L — ABNORMAL LOW (ref 22–32)
Calcium: 10.1 mg/dL (ref 8.9–10.3)
Chloride: 108 mmol/L (ref 98–111)
Creatinine, Ser: 1.2 mg/dL — ABNORMAL HIGH (ref 0.44–1.00)
GFR, Estimated: 53 mL/min — ABNORMAL LOW (ref >60)
Glucose, Bld: 367 mg/dL — ABNORMAL HIGH (ref 70–99)
Potassium: 3.3 mmol/L — ABNORMAL LOW (ref 3.5–5.1)
Sodium: 138 mmol/L (ref 135–145)

## 2022-06-20 LAB — CBC
HCT: 42.2 % (ref 36.0–46.0)
Hemoglobin: 13.1 g/dL (ref 12.0–15.0)
MCH: 28.7 pg (ref 26.0–34.0)
MCHC: 31 g/dL (ref 30.0–36.0)
MCV: 92.3 fL (ref 80.0–100.0)
Platelets: 270 10*3/uL (ref 150–400)
RBC: 4.57 MIL/uL (ref 3.87–5.11)
RDW: 15.4 % (ref 11.5–15.5)
WBC: 10.4 10*3/uL (ref 4.0–10.5)
nRBC: 0 % (ref 0.0–0.2)

## 2022-06-20 MED ORDER — MORPHINE SULFATE (PF) 4 MG/ML IV SOLN
4.0000 mg | Freq: Once | INTRAVENOUS | Status: AC
  Administered 2022-06-21: 4 mg via INTRAVENOUS
  Filled 2022-06-20: qty 1

## 2022-06-20 MED ORDER — ONDANSETRON HCL 4 MG/2ML IJ SOLN
4.0000 mg | Freq: Once | INTRAMUSCULAR | Status: AC
  Administered 2022-06-21: 4 mg via INTRAVENOUS
  Filled 2022-06-20: qty 2

## 2022-06-20 MED ORDER — SODIUM CHLORIDE 0.9 % IV BOLUS (SEPSIS)
1000.0000 mL | Freq: Once | INTRAVENOUS | Status: AC
  Administered 2022-06-21: 1000 mL via INTRAVENOUS

## 2022-06-20 NOTE — ED Notes (Signed)
Patients family c/o patient not being able to breathe. Patient checked by Minette Brine, RN who reported checking O2 saturation and WNL. Patient taken to CT for scans
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
## 2022-06-20 NOTE — ED Notes (Signed)
Pt states she cannot sit anylonger, family in lobby yelling at wait time. Pt assisted to recliner in triage. Pt continues to appear in no acute distress.

## 2022-06-20 NOTE — ED Notes (Signed)
Pt resting in recliner in triage, no acute distress noted.

## 2022-06-20 NOTE — ED Notes (Signed)
Patients family calling out to nurse she cannot breathe. Patient checked by this RN. Patient O2 96% RA. Instructed patient with breathing technique of in through her mouth and out through her nose. Patient holding breathe due to pain.
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
## 2022-06-21 ENCOUNTER — Emergency Department: Payer: 59

## 2022-06-21 LAB — HEPATIC FUNCTION PANEL
ALT: 24 U/L (ref 0–44)
AST: 20 U/L (ref 15–41)
Albumin: 4 g/dL (ref 3.5–5.0)
Alkaline Phosphatase: 101 U/L (ref 38–126)
Bilirubin, Direct: <0.1 mg/dL (ref 0.0–0.2)
Total Bilirubin: 0.8 mg/dL (ref 0.3–1.2)
Total Protein: 7.1 g/dL (ref 6.5–8.1)

## 2022-06-21 MED ORDER — ONDANSETRON 4 MG PO TBDP: 4.0000 mg | ORAL_TABLET | Freq: Four times a day (QID) | ORAL | 0 refills | Status: DC | PRN

## 2022-06-21 MED ORDER — IBUPROFEN 800 MG PO TABS: 800.0000 mg | ORAL_TABLET | Freq: Three times a day (TID) | ORAL | 0 refills | Status: DC | PRN

## 2022-06-21 MED ORDER — HYDROMORPHONE HCL 1 MG/ML IJ SOLN
1.0000 mg | Freq: Once | INTRAMUSCULAR | Status: AC
  Administered 2022-06-21: 1 mg via INTRAVENOUS
  Filled 2022-06-21: qty 1

## 2022-06-21 MED ORDER — ACETAMINOPHEN 500 MG PO TABS
1000.0000 mg | ORAL_TABLET | Freq: Once | ORAL | Status: AC
  Administered 2022-06-21: 1000 mg via ORAL
  Filled 2022-06-21: qty 2

## 2022-06-21 MED ORDER — IOHEXOL 300 MG/ML  SOLN
100.0000 mL | Freq: Once | INTRAMUSCULAR | Status: AC | PRN
  Administered 2022-06-21: 100 mL via INTRAVENOUS

## 2022-06-21 MED ORDER — OXYCODONE-ACETAMINOPHEN 5-325 MG PO TABS: 2.0000 | ORAL_TABLET | Freq: Four times a day (QID) | ORAL | 0 refills | Status: DC | PRN

## 2022-06-21 MED ORDER — KETOROLAC TROMETHAMINE 30 MG/ML IJ SOLN
30.0000 mg | Freq: Once | INTRAMUSCULAR | Status: AC
  Administered 2022-06-21: 30 mg via INTRAVENOUS
  Filled 2022-06-21: qty 1

## 2022-06-21 NOTE — ED Notes (Signed)
In CT

## 2022-06-21 NOTE — Discharge Instructions (Addendum)
You are being provided a prescription for opiates (also known as narcotics) for pain control.  Opiates can be addictive and should only be used when absolutely necessary for pain control when other alternatives do not work.  We recommend you only use them for the recommended amount of time and only as prescribed.  Please do not take with other sedative medications or alcohol.  Please do not drive, operate machinery, make important decisions while taking opiates.  Please note that these medications can be addictive and have high abuse potential.  Patients can become addicted to narcotics after only taking them for a few days.  Please keep these medications locked away from children, teenagers or any family members with history of substance abuse.  Narcotic pain medicine may also make you constipated.  You may use over-the-counter medications such as MiraLAX, Colace to prevent constipation.  If you become constipated, you may use over-the-counter enemas as needed.  Itching and nausea are also common side effects of narcotic pain medication.  If you develop uncontrolled vomiting or a rash, please stop these medications and seek medical care.   You have had a head injury resulting in a concussion.  A concussion is a clinical diagnosis and not seen on imaging (CT, MRI).  Please avoid alcohol, sedatives for the next week.  Please rest and drink plenty of water.  We recommend that you avoid any activity that may lead to another head injury for at least 1 week or until your symptoms have completely resolved.  We also recommend "brain rest" to ensure the best possible long term outcomes - please avoid TV, cell phones, tablets, computers as much as possible for the next 48 hours.

## 2022-06-25 ENCOUNTER — Encounter: Payer: Self-pay | Admitting: Obstetrics & Gynecology

## 2022-06-25 ENCOUNTER — Ambulatory Visit: Payer: 59

## 2022-06-25 ENCOUNTER — Ambulatory Visit (INDEPENDENT_AMBULATORY_CARE_PROVIDER_SITE_OTHER): Payer: Self-pay | Admitting: Obstetrics & Gynecology

## 2022-06-25 VITALS — BP 148/88

## 2022-06-25 DIAGNOSIS — N8110 Cystocele, unspecified: Secondary | ICD-10-CM

## 2022-06-25 DIAGNOSIS — R300 Dysuria: Secondary | ICD-10-CM | POA: Diagnosis not present

## 2022-06-25 DIAGNOSIS — Z4689 Encounter for fitting and adjustment of other specified devices: Secondary | ICD-10-CM

## 2022-06-25 LAB — POCT URINALYSIS DIPSTICK
Bilirubin, UA: NEGATIVE
Blood, UA: NEGATIVE
Glucose, UA: NEGATIVE
Ketones, UA: NEGATIVE
Nitrite, UA: POSITIVE
Protein, UA: NEGATIVE
Spec Grav, UA: 1.02 (ref 1.010–1.025)
Urobilinogen, UA: 0.2 E.U./dL
pH, UA: 7 (ref 5.0–8.0)

## 2022-06-25 MED ORDER — SULFAMETHOXAZOLE-TRIMETHOPRIM 800-160 MG PO TABS: 1.0000 | ORAL_TABLET | Freq: Two times a day (BID) | ORAL | 0 refills | Status: DC
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
## 2022-06-25 NOTE — Progress Notes (Unsigned)
SUBJECTIVE: [name] is a 56 y.o. female who complains of urinary frequency, urgency and dysuria x *** days, without flank pain, fever, chills, or abnormal vaginal discharge or bleeding.   OBJECTIVE: Appears well, in no apparent distress.  Vital signs are normal. The abdomen is soft without tenderness, guarding, mass, rebound or organomegaly. No CVA tenderness or inguinal adenopathy noted. Urine dipstick shows {ua dip:315113}.  Micro exam: {urine micro:315114}.   ASSESSMENT: UTI uncomplicated without evidence of pyelonephritis  PLAN: Treatment per orders - also push fluids, may use Pyridium OTC prn. Call or return to clinic prn if these symptoms worsen or fail to improve as anticipated.

## 2022-06-27 LAB — URINE CULTURE

## 2022-06-30 ENCOUNTER — Encounter: Payer: Self-pay | Admitting: Obstetrics & Gynecology

## 2022-06-30 ENCOUNTER — Other Ambulatory Visit: Payer: Self-pay | Admitting: Obstetrics & Gynecology

## 2022-06-30 ENCOUNTER — Other Ambulatory Visit: Payer: Self-pay | Admitting: Internal Medicine

## 2022-06-30 DIAGNOSIS — R946 Abnormal results of thyroid function studies: Secondary | ICD-10-CM

## 2022-06-30 DIAGNOSIS — E041 Nontoxic single thyroid nodule: Secondary | ICD-10-CM

## 2022-06-30 MED ORDER — CIPROFLOXACIN HCL 500 MG PO TABS: 500.0000 mg | ORAL_TABLET | Freq: Two times a day (BID) | ORAL | 0 refills | Status: DC

## 2022-06-30 NOTE — Progress Notes (Unsigned)
Cipro prescribed as her [name] is not sensitive to bactrim.

## 2022-07-01 ENCOUNTER — Ambulatory Visit: Payer: 59 | Admitting: Family

## 2022-07-02 ENCOUNTER — Telehealth: Payer: Self-pay

## 2022-07-02 ENCOUNTER — Ambulatory Visit (INDEPENDENT_AMBULATORY_CARE_PROVIDER_SITE_OTHER): Payer: Self-pay | Admitting: Surgery

## 2022-07-02 ENCOUNTER — Encounter: Payer: Self-pay | Admitting: Surgery

## 2022-07-02 VITALS — BP 145/82 | HR 84 | Temp 98.5°F | Wt 152.2 lb

## 2022-07-02 DIAGNOSIS — D1722 Benign lipomatous neoplasm of skin and subcutaneous tissue of left arm: Secondary | ICD-10-CM | POA: Diagnosis not present

## 2022-07-02 NOTE — Patient Instructions (Addendum)
Our surgery scheduler Pamala Hurry will call you within 24-48 hours to get you scheduled. If you have not heard from her after 48 hours, please call our office. Have the blue sheet available when she calls to write down important information.   If you have any concerns or questions, please feel free to call our office.    Lipoma Removal, Care After  The following information offers guidance on how to care for yourself after your procedure. Your health care provider may also give you more specific instructions. If you have problems or questions, contact your health care provider. What can I expect after the procedure? After the procedure, it is common to have: Mild pain. Swelling. Bruising. Follow these instructions at home: Bathing  Do not take baths, swim, or use a hot tub until your health care provider approves. Ask your health care provider if you may take showers. You may only be allowed to take sponge baths. Keep your bandage (dressing) clean and dry until your health care provider says it can be removed. Incision care  Follow instructions from your health care provider about how to take care of your incision. Make sure you: Wash your hands with soap and water for at least 20 seconds before and after you change your dressing. If soap and water are not available, use hand sanitizer. Change your dressing as told by your health care provider. Leave stitches (sutures), skin glue, or adhesive strips in place. These skin closures may need to stay in place for 2 weeks or longer. If adhesive strip edges start to loosen and curl up, you may trim the loose edges. Do not remove adhesive strips completely unless your health care provider tells you to do that. Check your incision area every day for signs of infection. Check for: More redness, swelling, or pain. Fluid or blood. Warmth. Pus or a bad smell. Medicines Take over-the-counter and prescription medicines only as told by your health care  provider. If you were prescribed an antibiotic medicine, use it as told by your health care provider. Do not stop using the antibiotic even if you start to feel better. General instructions  If you were given a sedative during the procedure, it can affect you for several hours. Do not drive or operate machinery until your health care provider says that it is safe. Do not use any products that contain nicotine or tobacco before the procedure. These products include cigarettes, chewing tobacco, and vaping devices, such as e-cigarettes. These can delay healing after surgery. If you need help quitting, ask your health care provider. Return to your normal activities as told by your health care provider. Ask your health care provider what activities are safe for you. Keep all follow-up visits. This is important. Contact a health care provider if: You have more redness, swelling, or pain around your incision. You have fluid or blood coming from your incision. Your incision feels warm to the touch. You have pus or a bad smell coming from your incision. You have pain that does not get better with medicine. Get help right away if: You have chills or a fever. You have severe pain. Summary After the procedure, it is common to have mild pain, swelling, and bruising. Follow instructions from your health care provider about how to take care of your incision. Contact a health care provider if you have signs of infection such as more redness, swelling, or pain. This information is not intended to replace advice given to you by your health  care provider. Make sure you discuss any questions you have with your health care provider. Document Revised: 12/27/2021 Document Reviewed: 12/27/2021 Elsevier Patient Education  [place].
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
## 2022-07-02 NOTE — Telephone Encounter (Signed)
Faxed medical clearance to Dr. Rosario Jacks.
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
## 2022-07-03 ENCOUNTER — Telehealth: Payer: Self-pay | Admitting: Surgery

## 2022-07-03 NOTE — Telephone Encounter (Signed)
Patient calls back, she is now informed of all dates regarding her surgery with Dr. Hampton Abbot and verbalized understanding.

## 2022-07-03 NOTE — Telephone Encounter (Signed)
Outgoing call is made, left message for patient to call, please inform her of the following regarding scheduled surgery:   Pre-Admission date/time, and Surgery date.  Surgery Date: 07/08/22 Preadmission Testing Date: 07/04/22 (phone 8a-1p)  Also patient to call at [phone], between 1-3:00pm the day before surgery, to find out what time to arrive for surgery.

## 2022-07-04 ENCOUNTER — Other Ambulatory Visit: Payer: Self-pay

## 2022-07-04 ENCOUNTER — Ambulatory Visit
Admission: RE | Admit: 2022-07-04 | Discharge: 2022-07-04 | Disposition: A | Discharge status: Home / Self Care | Payer: 59 | Source: Ambulatory Visit | Attending: Internal Medicine | Admitting: Internal Medicine

## 2022-07-04 ENCOUNTER — Encounter
Admission: RE | Admit: 2022-07-04 | Discharge: 2022-07-04 | Disposition: A | Discharge status: Home / Self Care | Payer: 59 | Source: Ambulatory Visit

## 2022-07-04 DIAGNOSIS — R946 Abnormal results of thyroid function studies: Secondary | ICD-10-CM | POA: Insufficient documentation

## 2022-07-04 DIAGNOSIS — E041 Nontoxic single thyroid nodule: Secondary | ICD-10-CM | POA: Diagnosis present

## 2022-07-04 DIAGNOSIS — E119 Type 2 diabetes mellitus without complications: Secondary | ICD-10-CM | POA: Insufficient documentation

## 2022-07-04 DIAGNOSIS — Z01812 Encounter for preprocedural laboratory examination: Secondary | ICD-10-CM | POA: Diagnosis present

## 2022-07-04 HISTORY — DX: Cardiac murmur, unspecified: R01.1

## 2022-07-04 HISTORY — DX: Unspecified osteoarthritis, unspecified site: M19.90

## 2022-07-04 HISTORY — DX: Anemia, unspecified: D64.9

## 2022-07-04 HISTORY — DX: Dyspnea, unspecified: R06.00

## 2022-07-04 HISTORY — DX: Unspecified urinary incontinence: R32

## 2022-07-04 HISTORY — DX: Gastro-esophageal reflux disease without esophagitis: K21.9

## 2022-07-04 NOTE — Patient Instructions (Addendum)
Your procedure is scheduled on: 07/08/22 - Tuesday Report to the Registration Desk on the 1st floor of the [facility]. To find out your arrival time, please call [phone] between 1PM - 3PM on: 07/07/22 - Monday If your arrival time is 6:00 am, do not arrive prior to that time as the [facility] entrance doors do not open until 6:00 am.  REMEMBER: Instructions that are not followed completely may result in serious medical risk, up to and including death; or upon the discretion of your surgeon and anesthesiologist your surgery may need to be rescheduled.  Do not eat food after midnight the night before surgery.  No gum chewing, lozengers or hard candies.  You may however, drink CLEAR liquids up to 2 hours before you are scheduled to arrive for your surgery. Do not drink anything within 2 hours of your scheduled arrival time.  Clear liquids include: - water - Type 1 and Type 2 diabetics should only drink water.   TAKE ONLY THESE MEDICATIONS THE MORNING OF SURGERY WITH A SIP OF WATER:  - omeprazole (PRILOSEC) 40 MG capsule, (take one the night before and one on the morning of surgery - helps to prevent nausea after surgery.) - budesonide-formoterol (SYMBICORT) 160-4.5 MCG/ACT inhaler - DULoxetine (CYMBALTA) 20 MG capsule - fluticasone-salmeterol (ADVAIR DISKUS) 250 - metoprolol tartrate (LOPRESSOR) 50  - sertraline (ZOLOFT) - atorvastatin (LIPITOR)  Use albuterol (VENTOLIN HFA) 108 (90 Base) on the day of surgery and bring to the hospital.  metFORMIN (GLUCOPHAGE) 1000 MG tablet - stop taking beginning 07/06/22, may resume taking the day after your procedure.  One week prior to surgery: Stop Anti-inflammatories (NSAIDS) such as Advil, Aleve, Ibuprofen, Motrin, Naproxen, Naprosyn and Aspirin based products such as Excedrin, Goodys Powder, BC Powder.  Stop ANY OVER THE COUNTER supplements until after surgery.  You may take Tylenol if needed for pain up until the day of  surgery.  No Alcohol for 24 hours before or after surgery.  No Smoking including e-cigarettes for 24 hours prior to surgery.  No chewable tobacco products for at least 6 hours prior to surgery.  No nicotine patches on the day of surgery.  Do not use any "recreational" drugs for at least a week prior to your surgery.  Please be advised that the combination of cocaine and anesthesia may have negative outcomes, up to and including death. If you test positive for cocaine, your surgery will be cancelled.  On the morning of surgery brush your teeth with toothpaste and water, you may rinse your mouth with mouthwash if you wish. Do not swallow any toothpaste or mouthwash.  Do not wear jewelry, make-up, hairpins, clips or nail polish.  Do not wear lotions, powders, or perfumes.   Do not shave body from the neck down 48 hours prior to surgery just in case you cut yourself which could leave a site for infection.  Also, freshly shaved skin may become irritated if using the CHG soap.  Contact lenses, hearing aids and dentures may not be worn into surgery.  Do not bring valuables to the hospital. [facility] is not responsible for any missing/lost belongings or valuables.   Notify your doctor if there is any change in your medical condition (cold, fever, infection).  Wear comfortable clothing (specific to your surgery type) to the hospital.  After surgery, you can help prevent lung complications by doing breathing exercises.  Take deep breaths and cough every 1-2 hours. Your doctor may order a device called an  Incentive Spirometer to help you take deep breaths. When coughing or sneezing, hold a pillow firmly against your incision with both hands. This is called "splinting." Doing this helps protect your incision. It also decreases belly discomfort.  If you are being admitted to the hospital overnight, leave your suitcase in the car. After surgery it may be brought to your room.  If you are  being discharged the day of surgery, you will not be allowed to drive home. You will need a responsible adult (18 years or older) to drive you home and stay with you that night.   If you are taking public transportation, you will need to have a responsible adult (18 years or older) with you. Please confirm with your physician that it is acceptable to use public transportation.   Please call the [facility] at [phone] if you have any questions about these instructions.  Surgery Visitation Policy:  Patients undergoing a surgery or procedure may have two family members or support persons with them as long as the person is not COVID-19 positive or experiencing its symptoms.   Inpatient Visitation:    Visiting hours are 7 a.m. to 8 p.m. Up to four visitors are allowed at one time in a patient room, including children. The visitors may rotate out with other people during the day. One designated support person (adult) may remain overnight.

## 2022-07-08 ENCOUNTER — Encounter: Payer: Self-pay | Admitting: Surgery

## 2022-07-08 ENCOUNTER — Ambulatory Visit: Payer: 59 | Admitting: Family

## 2022-07-08 ENCOUNTER — Ambulatory Visit: Payer: 59 | Admitting: Urgent Care

## 2022-07-08 ENCOUNTER — Other Ambulatory Visit: Payer: Self-pay

## 2022-07-08 ENCOUNTER — Encounter
Admission: RE | Disposition: A | Discharge status: Home / Self Care | Payer: Self-pay | Source: Home / Self Care | Attending: Surgery

## 2022-07-08 ENCOUNTER — Ambulatory Visit
Admission: RE | Admit: 2022-07-08 | Discharge: 2022-07-08 | Disposition: A | Discharge status: Home / Self Care | Payer: 59 | Attending: Surgery | Admitting: Surgery

## 2022-07-08 DIAGNOSIS — D1722 Benign lipomatous neoplasm of skin and subcutaneous tissue of left arm: Secondary | ICD-10-CM | POA: Diagnosis not present

## 2022-07-08 DIAGNOSIS — E119 Type 2 diabetes mellitus without complications: Secondary | ICD-10-CM | POA: Diagnosis not present

## 2022-07-08 DIAGNOSIS — J449 Chronic obstructive pulmonary disease, unspecified: Secondary | ICD-10-CM | POA: Insufficient documentation

## 2022-07-08 DIAGNOSIS — M1990 Unspecified osteoarthritis, unspecified site: Secondary | ICD-10-CM | POA: Insufficient documentation

## 2022-07-08 DIAGNOSIS — F32A Depression, unspecified: Secondary | ICD-10-CM | POA: Insufficient documentation

## 2022-07-08 DIAGNOSIS — Z7984 Long term (current) use of oral hypoglycemic drugs: Secondary | ICD-10-CM | POA: Insufficient documentation

## 2022-07-08 DIAGNOSIS — I10 Essential (primary) hypertension: Secondary | ICD-10-CM | POA: Diagnosis not present

## 2022-07-08 DIAGNOSIS — K219 Gastro-esophageal reflux disease without esophagitis: Secondary | ICD-10-CM | POA: Insufficient documentation

## 2022-07-08 DIAGNOSIS — G8929 Other chronic pain: Secondary | ICD-10-CM | POA: Insufficient documentation

## 2022-07-08 DIAGNOSIS — Z87891 Personal history of nicotine dependence: Secondary | ICD-10-CM | POA: Diagnosis not present

## 2022-07-08 DIAGNOSIS — Z01812 Encounter for preprocedural laboratory examination: Secondary | ICD-10-CM

## 2022-07-08 HISTORY — PX: LIPOMA EXCISION: SHX5283

## 2022-07-08 LAB — POCT I-STAT, CHEM 8
BUN: 19 mg/dL (ref 6–20)
Calcium, Ion: 1.24 mmol/L (ref 1.15–1.40)
Chloride: 105 mmol/L (ref 98–111)
Creatinine, Ser: 0.7 mg/dL (ref 0.44–1.00)
Glucose, Bld: 288 mg/dL — ABNORMAL HIGH (ref 70–99)
HCT: 40 % (ref 36.0–46.0)
Hemoglobin: 13.6 g/dL (ref 12.0–15.0)
Potassium: 3.9 mmol/L (ref 3.5–5.1)
Sodium: 138 mmol/L (ref 135–145)
TCO2: 24 mmol/L (ref 22–32)

## 2022-07-08 LAB — GLUCOSE, CAPILLARY: Glucose-Capillary: 186 mg/dL — ABNORMAL HIGH (ref 70–99)

## 2022-07-08 SURGERY — EXCISION, MASS, UPPER EXTREMITY
Anesthesia: General | Laterality: Left

## 2022-07-08 MED ORDER — CHLORHEXIDINE GLUCONATE CLOTH 2 % EX PADS: 6.0000 | MEDICATED_PAD | Freq: Once | CUTANEOUS | Status: DC

## 2022-07-08 MED ORDER — GABAPENTIN 300 MG PO CAPS: 300.0000 mg | ORAL_CAPSULE | ORAL | Status: AC

## 2022-07-08 MED ORDER — CHLORHEXIDINE GLUCONATE CLOTH 2 % EX PADS
6.0000 | MEDICATED_PAD | Freq: Once | CUTANEOUS | Status: AC
  Administered 2022-07-08: 6 via TOPICAL

## 2022-07-08 MED ORDER — ACETAMINOPHEN 500 MG PO TABS: 1000.0000 mg | ORAL_TABLET | ORAL | Status: AC

## 2022-07-08 MED ORDER — ONDANSETRON HCL 4 MG/2ML IJ SOLN
INTRAMUSCULAR | Status: AC
  Filled 2022-07-08: qty 2

## 2022-07-08 MED ORDER — MIDAZOLAM HCL 2 MG/2ML IJ SOLN
INTRAMUSCULAR | Status: DC | PRN
  Administered 2022-07-08 (×2): 1 mg via INTRAVENOUS

## 2022-07-08 MED ORDER — DEXAMETHASONE SODIUM PHOSPHATE 10 MG/ML IJ SOLN
INTRAMUSCULAR | Status: DC | PRN
  Administered 2022-07-08: 5 mg via INTRAVENOUS

## 2022-07-08 MED ORDER — FENTANYL CITRATE (PF) 100 MCG/2ML IJ SOLN
INTRAMUSCULAR | Status: AC
  Filled 2022-07-08: qty 2

## 2022-07-08 MED ORDER — CEFAZOLIN SODIUM-DEXTROSE 2-4 GM/100ML-% IV SOLN
INTRAVENOUS | Status: AC
  Filled 2022-07-08: qty 100

## 2022-07-08 MED ORDER — ORAL CARE MOUTH RINSE: 15.0000 mL | Freq: Once | OROMUCOSAL | Status: AC

## 2022-07-08 MED ORDER — MIDAZOLAM HCL 2 MG/2ML IJ SOLN
INTRAMUSCULAR | Status: AC
  Filled 2022-07-08: qty 2

## 2022-07-08 MED ORDER — FENTANYL CITRATE (PF) 100 MCG/2ML IJ SOLN: 25.0000 ug | INTRAMUSCULAR | Status: DC | PRN

## 2022-07-08 MED ORDER — LIDOCAINE HCL (PF) 2 % IJ SOLN
INTRAMUSCULAR | Status: AC
  Filled 2022-07-08: qty 5

## 2022-07-08 MED ORDER — OXYCODONE HCL 5 MG PO TABS
ORAL_TABLET | ORAL | Status: AC
  Filled 2022-07-08: qty 1

## 2022-07-08 MED ORDER — ONDANSETRON HCL 4 MG/2ML IJ SOLN
INTRAMUSCULAR | Status: DC | PRN
  Administered 2022-07-08: 4 mg via INTRAVENOUS

## 2022-07-08 MED ORDER — OXYCODONE HCL 5 MG PO TABS: 5.0000 mg | ORAL_TABLET | ORAL | 0 refills | Status: DC | PRN

## 2022-07-08 MED ORDER — 0.9 % SODIUM CHLORIDE (POUR BTL) OPTIME
TOPICAL | Status: DC | PRN
  Administered 2022-07-08: 300 mL

## 2022-07-08 MED ORDER — SODIUM CHLORIDE 0.9 % IV SOLN: INTRAVENOUS | Status: DC

## 2022-07-08 MED ORDER — BUPIVACAINE LIPOSOME 1.3 % IJ SUSP
INTRAMUSCULAR | Status: AC
  Filled 2022-07-08: qty 10

## 2022-07-08 MED ORDER — LIDOCAINE HCL (CARDIAC) PF 100 MG/5ML IV SOSY
PREFILLED_SYRINGE | INTRAVENOUS | Status: DC | PRN
  Administered 2022-07-08: 80 mg via INTRAVENOUS

## 2022-07-08 MED ORDER — PROPOFOL 10 MG/ML IV BOLUS
INTRAVENOUS | Status: DC | PRN
  Administered 2022-07-08: 140 mg via INTRAVENOUS

## 2022-07-08 MED ORDER — SUCCINYLCHOLINE CHLORIDE 200 MG/10ML IV SOSY
PREFILLED_SYRINGE | INTRAVENOUS | Status: DC | PRN
  Administered 2022-07-08: 80 mg via INTRAVENOUS

## 2022-07-08 MED ORDER — BUPIVACAINE LIPOSOME 1.3 % IJ SUSP: 20.0000 mL | Freq: Once | INTRAMUSCULAR | Status: DC

## 2022-07-08 MED ORDER — DEXAMETHASONE SODIUM PHOSPHATE 10 MG/ML IJ SOLN
INTRAMUSCULAR | Status: AC
  Filled 2022-07-08: qty 1

## 2022-07-08 MED ORDER — BUPIVACAINE-EPINEPHRINE (PF) 0.5% -1:200000 IJ SOLN
INTRAMUSCULAR | Status: DC | PRN
  Administered 2022-07-08: 30 mL

## 2022-07-08 MED ORDER — GABAPENTIN 300 MG PO CAPS
ORAL_CAPSULE | ORAL | Status: AC
  Administered 2022-07-08: 300 mg via ORAL
  Filled 2022-07-08: qty 1

## 2022-07-08 MED ORDER — PHENYLEPHRINE HCL-NACL 20-0.9 MG/250ML-% IV SOLN
INTRAVENOUS | Status: AC
  Filled 2022-07-08: qty 250

## 2022-07-08 MED ORDER — INSULIN ASPART 100 UNIT/ML IJ SOLN
INTRAMUSCULAR | Status: AC
  Filled 2022-07-08: qty 1

## 2022-07-08 MED ORDER — BUPIVACAINE HCL (PF) 0.5 % IJ SOLN
INTRAMUSCULAR | Status: AC
  Filled 2022-07-08: qty 30

## 2022-07-08 MED ORDER — ACETAMINOPHEN 500 MG PO TABS: 1000.0000 mg | ORAL_TABLET | Freq: Four times a day (QID) | ORAL | Status: DC | PRN

## 2022-07-08 MED ORDER — INSULIN ASPART 100 UNIT/ML IJ SOLN
5.0000 [IU] | Freq: Once | INTRAMUSCULAR | Status: AC
  Administered 2022-07-08: 5 [IU] via SUBCUTANEOUS

## 2022-07-08 MED ORDER — BUPIVACAINE LIPOSOME 1.3 % IJ SUSP
INTRAMUSCULAR | Status: AC
  Filled 2022-07-08: qty 20

## 2022-07-08 MED ORDER — CEFAZOLIN SODIUM-DEXTROSE 2-4 GM/100ML-% IV SOLN
2.0000 g | INTRAVENOUS | Status: AC
  Administered 2022-07-08: 2 g via INTRAVENOUS

## 2022-07-08 MED ORDER — ACETAMINOPHEN 500 MG PO TABS
ORAL_TABLET | ORAL | Status: AC
  Administered 2022-07-08: 1000 mg via ORAL
  Filled 2022-07-08: qty 2

## 2022-07-08 MED ORDER — OXYCODONE HCL 5 MG PO TABS
5.0000 mg | ORAL_TABLET | Freq: Once | ORAL | Status: AC | PRN
  Administered 2022-07-08: 5 mg via ORAL

## 2022-07-08 MED ORDER — IBUPROFEN 800 MG PO TABS: 800.0000 mg | ORAL_TABLET | Freq: Three times a day (TID) | ORAL | 1 refills | Status: DC | PRN

## 2022-07-08 MED ORDER — FENTANYL CITRATE (PF) 100 MCG/2ML IJ SOLN
INTRAMUSCULAR | Status: DC | PRN
  Administered 2022-07-08: 50 ug via INTRAVENOUS
  Administered 2022-07-08 (×3): 25 ug via INTRAVENOUS

## 2022-07-08 MED ORDER — OXYCODONE HCL 5 MG/5ML PO SOLN: 5.0000 mg | Freq: Once | ORAL | Status: AC | PRN

## 2022-07-08 MED ORDER — BUPIVACAINE-EPINEPHRINE (PF) 0.5% -1:200000 IJ SOLN
INTRAMUSCULAR | Status: AC
  Filled 2022-07-08: qty 30

## 2022-07-08 MED ORDER — PROPOFOL 10 MG/ML IV BOLUS
INTRAVENOUS | Status: AC
  Filled 2022-07-08: qty 40

## 2022-07-08 MED ORDER — CHLORHEXIDINE GLUCONATE 0.12 % MT SOLN: 15.0000 mL | Freq: Once | OROMUCOSAL | Status: AC

## 2022-07-08 MED ORDER — CHLORHEXIDINE GLUCONATE 0.12 % MT SOLN
OROMUCOSAL | Status: AC
  Administered 2022-07-08: 15 mL via OROMUCOSAL
  Filled 2022-07-08: qty 15

## 2022-07-08 SURGICAL SUPPLY — 36 items
ADH SKN CLS APL DERMABOND .7 (GAUZE/BANDAGES/DRESSINGS) ×1
APL PRP STRL LF DISP 70% ISPRP (MISCELLANEOUS) ×1
BNDG CMPR 5X6 CHSV STRCH STRL (GAUZE/BANDAGES/DRESSINGS) ×1
BNDG COHESIVE 6X5 TAN ST LF (GAUZE/BANDAGES/DRESSINGS) ×1 IMPLANT
CHLORAPREP W/TINT 26 (MISCELLANEOUS) ×2 IMPLANT
COVER PROBE FLX POLY STRL (MISCELLANEOUS) ×1 IMPLANT
DERMABOND ADVANCED (GAUZE/BANDAGES/DRESSINGS) ×1
DERMABOND ADVANCED .7 DNX12 (GAUZE/BANDAGES/DRESSINGS) ×1 IMPLANT
DRAPE 3/4 80X56 (DRAPES) ×3 IMPLANT
DRAPE LAPAROTOMY 100X77 ABD (DRAPES) ×2 IMPLANT
ELECT CAUTERY BLADE TIP 2.5 (TIP) ×2
ELECT REM PT RETURN 9FT ADLT (ELECTROSURGICAL) ×2
ELECTRODE CAUTERY BLDE TIP 2.5 (TIP) ×1 IMPLANT
ELECTRODE REM PT RTRN 9FT ADLT (ELECTROSURGICAL) ×1 IMPLANT
GLOVE SURG SYN 7.0 (GLOVE) ×6 IMPLANT
GLOVE SURG SYN 7.0 PF PI (GLOVE) ×1 IMPLANT
GLOVE SURG SYN 7.5  E (GLOVE) ×6
GLOVE SURG SYN 7.5 E (GLOVE) ×3 IMPLANT
GLOVE SURG SYN 7.5 PF PI (GLOVE) ×1 IMPLANT
GOWN STRL REUS W/ TWL LRG LVL3 (GOWN DISPOSABLE) ×2 IMPLANT
GOWN STRL REUS W/TWL LRG LVL3 (GOWN DISPOSABLE) ×6
KIT TURNOVER KIT A (KITS) ×2 IMPLANT
MANIFOLD NEPTUNE II (INSTRUMENTS) ×2 IMPLANT
NEEDLE HYPO 22GX1.5 SAFETY (NEEDLE) ×2 IMPLANT
NS IRRIG 1000ML POUR BTL (IV SOLUTION) ×2 IMPLANT
PACK BASIN MINOR ARMC (MISCELLANEOUS) ×2 IMPLANT
STOCKINETTE IMPERV 14X48 (MISCELLANEOUS) ×1 IMPLANT
SUT MNCRL 4-0 (SUTURE) ×2
SUT MNCRL 4-0 27XMFL (SUTURE) ×1
SUT VIC AB 2-0 SH 27 (SUTURE) ×2
SUT VIC AB 2-0 SH 27XBRD (SUTURE) IMPLANT
SUT VIC AB 3-0 SH 27 (SUTURE) ×2
SUT VIC AB 3-0 SH 27X BRD (SUTURE) ×2 IMPLANT
SUTURE MNCRL 4-0 27XMF (SUTURE) ×1 IMPLANT
SYR 30ML LL (SYRINGE) ×2 IMPLANT
WATER STERILE IRR 500ML POUR (IV SOLUTION) ×2 IMPLANT

## 2022-07-08 NOTE — Interval H&P Note (Signed)
History and Physical Interval Note:  07/08/2022 7:26 AM  [name]  has presented today for surgery, with the diagnosis of left upper arm lipoma, subfascial 2 cm.  The various methods of treatment have been discussed with the patient and family. After consideration of risks, benefits and other options for treatment, the patient has consented to  Procedure(s): EXCISION ARM MASS, lipoma, subfascial (Left) as a surgical intervention.  The patient's history has been reviewed, patient examined, no change in status, stable for surgery.  I have reviewed the patient's chart and labs.  Questions were answered to the patient's satisfaction.     Baldemar Dady

## 2022-07-08 NOTE — Transfer of Care (Signed)
Immediate Anesthesia Transfer of Care Note  Patient: [name]  Procedure(s) Performed: EXCISION ARM MASS, lipoma, subfascial (Left)  Patient Location: PACU  Anesthesia Type:General  Level of Consciousness: awake and patient cooperative  Airway & Oxygen Therapy: Patient Spontanous Breathing and Patient connected to face mask  Post-op Assessment: Report given to RN and Post -op Vital signs reviewed and stable  Post vital signs: Reviewed and stable  Last Vitals:  Vitals Value Taken Time  BP 177/96 07/08/22 1033  Temp 35.7 C 07/08/22 1033  Pulse 79 07/08/22 1041  Resp 15 07/08/22 1041  SpO2 100 % 07/08/22 1041    Last Pain:  Vitals:   07/08/22 1033  TempSrc:   PainSc: Asleep         Complications: No notable events documented.

## 2022-07-08 NOTE — Anesthesia Procedure Notes (Signed)
Procedure Name: Intubation Date/Time: 07/08/2022 8:29 AM  Performed by: Loletha Grayer, CRNAPre-anesthesia Checklist: Patient identified, Patient being monitored, Timeout performed, Emergency Drugs available and Suction available Patient Re-evaluated:Patient Re-evaluated prior to induction Oxygen Delivery Method: Circle system utilized Preoxygenation: Pre-oxygenation with 100% oxygen Induction Type: IV induction and Rapid sequence Laryngoscope Size: Miller and 2 Grade View: Grade I Tube type: Oral Tube size: 7.0 mm Number of attempts: 1 Airway Equipment and Method: Stylet Placement Confirmation: ETT inserted through vocal cords under direct vision, positive ETCO2 and breath sounds checked- equal and bilateral Secured at: 20 cm Tube secured with: Tape Dental Injury: Teeth and Oropharynx as per pre-operative assessment

## 2022-07-08 NOTE — Op Note (Signed)
  Procedure Date:  07/08/2022  Pre-operative Diagnosis:  Left upper arm subfascial lipoma  Post-operative Diagnosis: Left upper arm subfascial fat  Procedure:  Excision of left upper arm subfascial fat, 2 cm  Surgeon:  Melvyn Neth, MD  Anesthesia:  General endotracheal  Estimated Blood Loss:  5 ml  Specimens:  left upper arm fatty mass  Complications:  None  Indications for Procedure:  This is a 56 y.o. female with diagnosis of a symptomatic left upper arm subfascial lipoma, noted on MRI.  The patient wishes to have this excised. The risks of bleeding, abscess or infection, injury to surrounding structures, and need for further procedures were all discussed with the patient and she was willing to proceed.  Description of Procedure: The patient was correctly identified in the preoperative area and brought into the operating room.  The patient was placed supine with VTE prophylaxis in place.  Appropriate time-outs were performed.  Anesthesia was induced and the patient was intubated.  Appropriate antibiotics were infused.  Ultrasound was used to localize the area of the lipoma corresponding to the findings on the MRI.  The lipoma was in subfascial location in the medial bundle of the biceps muscle.  This was marked with marking pen.  The patient's left arm was prepped and draped in usual sterile fashion.  A 5 cm incision was made over the marked area and cautery was used to dissect down the subcutaneous tissue to the muscular fascia.  The fascia was incised with scalpel and the fascia was elevated off the muscle and spread using a Weitlander retractor.  The muscle bundle was then incised and fibers spread to find the lipoma.  After spreading in a few areas along the incision, no organized lipoma was found.  There were some lobules of fatty tissue distributed in the area, but nothing organized.  The fatty tissue was excised as able using cautery.  Further dissection within the muscle did not  reveal any organized mass.  Ultrasound was used multiple times to localize the lipoma, and although it could be seen on ultrasound and could be marked using hemostat under ultrasound guidance, a mass was never found.  The excised fatty tissue was sent to pathology as a precaution.  In the area where the ultrasound showed a lipoma, only muscle fibers were truly found.  Perhaps this was related to muscle injury as the muscle had a paler appearance compared to the surrounding muscle.  The area was then irrigated thoroughly.  30 ml of Exparel solution mixed with 0.5% bupivacaine with epi was infiltrated onto the fascia and subcutaneous tissue.  2-0 Vicryl was used to approximate the muscle bundles and to close the fascia.  The wound was then closed in two layers using 3-0 Vicryl and 4-0 Monocryl.  The incision was cleaned and sealed with DermaBond.  The patient was then emerged from anesthesia, extubated, and brought to the recovery room for further management.    The patient tolerated the procedure well and all counts were correct at the end of the case.   Melvyn Neth, MD

## 2022-07-08 NOTE — Anesthesia Postprocedure Evaluation (Signed)
Anesthesia Post Note  Patient: [name]  Procedure(s) Performed: EXCISION ARM MASS, lipoma, subfascial (Left)  Patient location during evaluation: PACU Anesthesia Type: General Level of consciousness: awake and alert Pain management: pain level controlled Vital Signs Assessment: post-procedure vital signs reviewed and stable Respiratory status: spontaneous breathing, nonlabored ventilation, respiratory function stable and patient connected to nasal cannula oxygen Cardiovascular status: blood pressure returned to baseline and stable Postop Assessment: no apparent nausea or vomiting Anesthetic complications: no   No notable events documented.   Last Vitals:  Vitals:   07/08/22 1106 07/08/22 1116  BP: (!) 140/92 (!) 149/87  Pulse: (!) 58 71  Resp: 15 16  Temp:  (!) 36.2 C  SpO2: 100% 99%    Last Pain:  Vitals:   07/08/22 1116  TempSrc:   PainSc: [place]

## 2022-07-08 NOTE — Anesthesia Preprocedure Evaluation (Addendum)
Anesthesia Evaluation  Patient identified by MRN, date of birth, ID band Patient awake    Reviewed: Allergy & Precautions, NPO status , Patient's Chart, lab work & pertinent test results  Airway Mallampati: II  TM Distance: >3 FB Neck ROM: full    Dental  (+) Teeth Intact   Pulmonary neg pulmonary ROS, asthma , former smoker,    Pulmonary exam normal        Cardiovascular hypertension, Normal cardiovascular exam+ Valvular Problems/Murmurs      Neuro/Psych PSYCHIATRIC DISORDERS negative neurological ROS     GI/Hepatic Neg liver ROS, GERD  ,  Endo/Other  negative endocrine ROSdiabetes  Renal/GU      Musculoskeletal   Abdominal   Peds  Hematology negative hematology ROS (+)   Anesthesia Other Findings Past Medical History: No date: Acute diverticulitis 10/14/2016: Acute pain of right knee 01/26/2019: Acute renal failure (ARF) (HCC) No date: Anemia No date: Arthritis No date: Chronic back pain     Comment:  Complicated by neuropathy. ->  On Neurontin and               duloxetine along with Flexeril and Voltaren gel.  Also               uses PRN tramadol. No date: COPD with asthma ([place])     Comment:  On combination of albuterol Qvar and Symbicort No date: Depression No date: Diabetes mellitus without complication ([place]) 43/32/9518: Diverticulitis large intestine No date: Dyspnea No date: GERD (gastroesophageal reflux disease) No date: Heart murmur No date: Hypertension No date: Urinary incontinence     Comment:  Pessary present  Past Surgical History: 12/2021: 7-Day Zio Patch Monitor     Comment:  Predominant sinus rhythm.  Rate range 38-136 bpm with an              average of 80 bpm (bradycardia only noted during hours of              sleep.  5 Atrial Runs-fastest was 5 beats at a max rate               162 bpm, longest was 17.2 seconds/33 beats at a rate of               119 bpm--not noted on patient  trigger.  Frequent PACs               noted (5.3%).  Rare PVCs. No date: ABDOMINAL HYSTERECTOMY 10/02/2021: COLONOSCOPY WITH PROPOFOL; N/A     Comment:  Procedure: COLONOSCOPY WITH PROPOFOL;  Surgeon: Lin Landsman, MD;  Location: [facility] ENDOSCOPY;  Service:               Gastroenterology;  Laterality: N/A; 01/03/2022: Coronary CT angiogram     Comment:  Thoracic aortic atherosclerosis.  Coronary Calcium Score              1.29.  Minimal proximal LAD calcification. (<25%). 10/02/2021: ESOPHAGOGASTRODUODENOSCOPY; N/A     Comment:  Procedure: ESOPHAGOGASTRODUODENOSCOPY (EGD);  Surgeon:               Lin Landsman, MD;  Location: [facility] ENDOSCOPY;                Service: Gastroenterology;  Laterality: N/A; 09/29/2015: TRANSTHORACIC ECHOCARDIOGRAM     Comment:  EF 55-60%.  No R WMA.  GR 1 DD.  Is also normal  valves.               Normal study. 01/09/2022: TRANSTHORACIC ECHOCARDIOGRAM     Comment:  EF 60 to 65%.  Mild to moderate LVH.  G1 DD.  Normal RV               size and function.  Normal atrial sizes.  Normal valves.               NORMAL ECHO No date: TUBAL LIGATION  BMI    Body Mass Index: 28.76 kg/m      Reproductive/Obstetrics negative OB ROS                            Anesthesia Physical Anesthesia Plan  ASA: 2  Anesthesia Plan: General/Spinal   Post-op Pain Management:    Induction: Intravenous  PONV Risk Score and Plan: 3 and Ondansetron, Dexamethasone, Midazolam and Treatment may vary due to age or medical condition  Airway Management Planned: Oral ETT  Additional Equipment:   Intra-op Plan:   Post-operative Plan: Extubation in OR  Informed Consent: I have reviewed the patients History and Physical, chart, labs and discussed the procedure including the risks, benefits and alternatives for the proposed anesthesia with the patient or authorized representative who has indicated his/her understanding and acceptance.      Dental Advisory Given  Plan Discussed with: Anesthesiologist, CRNA and Surgeon  Anesthesia Plan Comments: (Patient consented for risks of anesthesia including but not limited to:  - adverse reactions to medications - damage to eyes, teeth, lips or other oral mucosa - nerve damage due to positioning  - sore throat or hoarseness - Damage to heart, brain, nerves, lungs, other parts of body or loss of life  Patient voiced understanding.)        Anesthesia Quick Evaluation

## 2022-07-08 NOTE — Progress Notes (Addendum)
Notified Dr. Barbra Sarks that patients glucose with the I stat was 288.  He acknowledged and gave me a verbal order for 5 units of Novolog.  I acknowledged and placed the order.

## 2022-07-08 NOTE — Discharge Instructions (Signed)

## 2022-07-09 LAB — SURGICAL PATHOLOGY

## 2022-07-10 NOTE — Progress Notes (Unsigned)
Medical clearance received from Dr Guerry Bruin office. The patient is cleared at Low risk for surgery.

## 2022-07-15 ENCOUNTER — Ambulatory Visit: Payer: 59 | Admitting: Family

## 2022-07-15 DIAGNOSIS — M542 Cervicalgia: Secondary | ICD-10-CM | POA: Diagnosis not present

## 2022-07-15 DIAGNOSIS — M5440 Lumbago with sciatica, unspecified side: Secondary | ICD-10-CM

## 2022-07-15 MED ORDER — METHOCARBAMOL 500 MG PO TABS: 500.0000 mg | ORAL_TABLET | Freq: Four times a day (QID) | ORAL | 0 refills | Status: DC | PRN

## 2022-07-15 MED ORDER — IBUPROFEN 800 MG PO TABS: 800.0000 mg | ORAL_TABLET | Freq: Three times a day (TID) | ORAL | 0 refills | Status: DC | PRN

## 2022-07-15 MED ORDER — OXYCODONE-ACETAMINOPHEN 5-325 MG PO TABS: 1.0000 | ORAL_TABLET | Freq: Three times a day (TID) | ORAL | 0 refills | Status: DC | PRN
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
## 2022-07-16 ENCOUNTER — Other Ambulatory Visit: Payer: Self-pay | Admitting: Internal Medicine

## 2022-07-16 DIAGNOSIS — R9389 Abnormal findings on diagnostic imaging of other specified body structures: Secondary | ICD-10-CM

## 2022-07-17 ENCOUNTER — Telehealth: Payer: Self-pay

## 2022-07-17 ENCOUNTER — Encounter: Payer: Self-pay | Admitting: Obstetrics and Gynecology

## 2022-07-17 ENCOUNTER — Ambulatory Visit: Payer: Self-pay | Admitting: Obstetrics and Gynecology

## 2022-07-17 VITALS — BP 110/70 | Ht 61.5 in | Wt 147.0 lb

## 2022-07-17 DIAGNOSIS — B3731 Acute candidiasis of vulva and vagina: Secondary | ICD-10-CM | POA: Diagnosis not present

## 2022-07-17 DIAGNOSIS — N8110 Cystocele, unspecified: Secondary | ICD-10-CM | POA: Diagnosis not present

## 2022-07-17 LAB — POCT WET PREP WITH KOH
Clue Cells Wet Prep HPF POC: NEGATIVE
KOH Prep POC: NEGATIVE
Trichomonas, UA: NEGATIVE
Yeast Wet Prep HPF POC: POSITIVE

## 2022-07-17 MED ORDER — FLUCONAZOLE 150 MG PO TABS: 150.0000 mg | ORAL_TABLET | Freq: Once | ORAL | 0 refills | Status: AC

## 2022-07-17 NOTE — Progress Notes (Signed)
Crystal Carls, MD   Chief Complaint  Patient presents with   Vaginal Discharge    Thick milky, vag itching, no odor x 1 week    HPI:      Ms. DELICIA [name] is a 56 y.o. [phone] whose LMP was No LMP recorded. Patient has had a hysterectomy., presents today for increased vag d/c with vaginal itching and burning for the past wk. No odor. Has been scratching making sx worse. No meds to treat. No new LBP, fevers. Treated 7/23 with Bactrim and cipro for UTI with UTI relief.  Has pessary, feels like it's falling down since cleaning/placement 4/23. Dr. Hulan Fray cleaned and placed 7/23 and sx have persisted. Has appt with urogyn 9/23 to f/u.    Patient Active Problem List   Diagnosis Date Noted   Lipoma of left upper extremity    Exercise intolerance 12/26/2021   Heart murmur 12/26/2021   DOE (dyspnea on exertion) 12/26/2021   Palpitations 12/26/2021   Abdominal bloating    Polyp of colon    Multiple adenomatous polyps    Depression    Hyperlipidemia associated with type 2 diabetes mellitus (HCC)    GERD (gastroesophageal reflux disease)    Influenza A 01/26/2019   Marijuana use 06/12/2017   Diabetic neuropathy ([place]) 01/07/2016   Plantar fasciitis of left foot 11/26/2015   Back pain 10/10/2015   Asthma 09/29/2015   Essential hypertension 09/29/2015   Diabetes mellitus without complication ([place]) 14/48/1856    Past Surgical History:  Procedure Laterality Date   7-Day Zio Patch Monitor  12/2021   Predominant sinus rhythm.  Rate range 38-136 bpm with an average of 80 bpm (bradycardia only noted during hours of sleep.  5 Atrial Runs-fastest was 5 beats at a max rate 162 bpm, longest was 17.2 seconds/33 beats at a rate of 119 bpm--not noted on patient trigger.  Frequent PACs noted (5.3%).  Rare PVCs.   ABDOMINAL HYSTERECTOMY     COLONOSCOPY WITH PROPOFOL N/A 10/02/2021   Procedure: COLONOSCOPY WITH PROPOFOL;  Surgeon: Lin Landsman, MD;  [facility] ENDOSCOPY;  Service:  Gastroenterology;  Laterality: N/A;   Coronary CT angiogram  01/03/2022   Thoracic aortic atherosclerosis.  Coronary Calcium Score 1.29.  Minimal proximal LAD calcification. (<25%).   ESOPHAGOGASTRODUODENOSCOPY N/A 10/02/2021   Procedure: ESOPHAGOGASTRODUODENOSCOPY (EGD);  Surgeon: Lin Landsman, MD;  Location: [facility] ENDOSCOPY;  Service: Gastroenterology;  Laterality: N/A;   TRANSTHORACIC ECHOCARDIOGRAM  09/29/2015   EF 55-60%.  No R WMA.  GR 1 DD.  Is also normal valves.  Normal study.   TRANSTHORACIC ECHOCARDIOGRAM  01/09/2022   EF 60 to 65%.  Mild to moderate LVH.  G1 DD.  Normal RV size and function.  Normal atrial sizes.  Normal valves.  NORMAL ECHO   TUBAL LIGATION      Family History  Problem Relation Age of Onset   Hypertension Mother    Other Other     Social History   Socioeconomic History   Marital status: Single    Spouse name: Not on file   Number of children: Not on file   Years of education: Not on file   Highest education level: Not on file  Occupational History   Not on file  Tobacco Use   Smoking status: Former    Packs/day: 3.00    Years: 10.00    Total pack years: 30.00    Types: Cigarettes    Quit date: 12/22/2004    Years since  quitting: 17.5   Smokeless tobacco: Never  Vaping Use   Vaping Use: Never used  Substance and Sexual Activity   Alcohol use: No   Drug use: No   Sexual activity: Not Currently  Other Topics Concern   Not on file  Social History Narrative   [facility] (07/20/17):   No bird or mold exposure. No recent travel.          [place] - urology    [facility] - nuerology    Social Determinants of Health   Financial Resource Strain: Not on file  Food Insecurity: Not on file  Transportation Needs: Not on file  Physical Activity: Not on file  Stress: Not on file  Social Connections: Not on file  Intimate Partner Violence: Not on file    Outpatient  Medications Prior to Visit  Medication Sig Dispense Refill   acetaminophen (TYLENOL) 325 MG tablet Take 2 tablets (650 mg total) by mouth every 6 (six) hours as needed for mild pain or fever. 30 tablet 0   acetaminophen (TYLENOL) 500 MG tablet Take 2 tablets (1,000 mg total) by mouth every 6 (six) hours as needed for mild pain.     albuterol (PROVENTIL) (2.5 MG/3ML) 0.083% nebulizer solution Take 3 mLs (2.5 mg total) by nebulization every 2 (two) hours as needed for wheezing or shortness of breath. 75 mL 0   albuterol (VENTOLIN HFA) 108 (90 Base) MCG/ACT inhaler Inhale 2 puffs into the lungs every 6 (six) hours as needed for wheezing or shortness of breath. 8 g 2   aspirin EC 81 MG tablet Take 81 mg by mouth daily.     atorvastatin (LIPITOR) 10 MG tablet Take 10 mg by mouth daily.     budesonide-formoterol (SYMBICORT) 160-4.5 MCG/ACT inhaler Inhale 2 puffs into the lungs 2 (two) times daily. 1 each 12   Cholecalciferol (VITAMIN D3) 1.25 MG (50000 UT) CAPS Take 1 capsule by mouth once a week.     diclofenac sodium (VOLTAREN) 1 % GEL Apply 2 g topically 4 (four) times daily as needed (pain). 1 Tube 0   fluticasone-salmeterol (ADVAIR DISKUS) 250-50 MCG/ACT AEPB Inhale 1 puff into the lungs in the morning and at bedtime. 60 each 2   gabapentin (NEURONTIN) 600 MG tablet Take by mouth 2 (two) times daily.     glipiZIDE (GLUCOTROL) 5 MG tablet Take 1 tablet (5 mg total) by mouth every morning. 30 tablet 0   ibuprofen (ADVIL) 800 MG tablet Take 1 tablet (800 mg total) by mouth every 8 (eight) hours as needed for moderate pain. 60 tablet 1   ibuprofen (ADVIL) 800 MG tablet Take 1 tablet (800 mg total) by mouth every 8 (eight) hours as needed for mild pain. 60 tablet 0   loratadine (CLARITIN) 10 MG tablet Take 1 tablet (10 mg total) by mouth daily. 30 tablet 0   losartan (COZAAR) 50 MG tablet Take 1 tablet (50 mg total) by mouth daily. 30 tablet 1   metFORMIN (GLUCOPHAGE) 1000 MG tablet Take 1 tablet (1,000  mg total) by mouth 2 (two) times daily with a meal. See discharge instructions for how to take medicine at first 60 tablet 1   methocarbamol (ROBAXIN) 500 MG tablet Take 1 tablet (500 mg total) by mouth every 6 (six) hours as needed for muscle spasms. 30 tablet 0   metoprolol tartrate (LOPRESSOR) 50 MG tablet Take 100 mg (2  tablets) in the morning and 50 mg (1 tablet) in the evening. You may take 25 mg (1/2) extra daily as needed for palpitations. 560 tablet 3   omeprazole (PRILOSEC) 40 MG capsule Take 1 capsule (40 mg total) by mouth daily. 30 capsule 2   ondansetron (ZOFRAN-ODT) 4 MG disintegrating tablet Take 1 tablet (4 mg total) by mouth every 6 (six) hours as needed for nausea or vomiting. 20 tablet 0   oxyCODONE (OXY IR/ROXICODONE) 5 MG immediate release tablet Take 1 tablet (5 mg total) by mouth every 4 (four) hours as needed for severe pain. 30 tablet 0   oxyCODONE-acetaminophen (PERCOCET/ROXICET) 5-325 MG tablet Take 1 tablet by mouth every 8 (eight) hours as needed for severe pain. 15 tablet 0   polyethylene glycol (MIRALAX / GLYCOLAX) 17 g packet Take 17 g by mouth 2 (two) times daily. 30 each 1   Semaglutide (RYBELSUS) 7 MG TABS Take by mouth daily.     sertraline (ZOLOFT) 50 MG tablet Take 1.5 tablets (75 mg total) by mouth every morning. 45 tablet 0   DULoxetine (CYMBALTA) 20 MG capsule Take 20 mg by mouth daily.     No facility-administered medications prior to visit.      ROS:  Review of Systems  Constitutional:  Negative for fever.  Gastrointestinal:  Negative for blood in stool, constipation, diarrhea, nausea and vomiting.  Genitourinary:  Positive for vaginal discharge. Negative for dyspareunia, dysuria, flank pain, frequency, hematuria, urgency, vaginal bleeding and vaginal pain.  Musculoskeletal:  Negative for back pain.  Skin:  Negative for rash.   BREAST: No symptoms   OBJECTIVE:   Vitals:  BP 110/70   Ht 5' 1.5" (1.562 m)   Wt 147 lb (66.7 kg)   BMI 27.33  kg/m   Physical Exam Vitals reviewed.  Constitutional:      Appearance: She is well-developed.  Pulmonary:     Effort: Pulmonary effort is normal.  Genitourinary:    Pubic Area: No rash.      Labia:        Right: Rash present. No tenderness or lesion.        Left: Rash present. No tenderness or lesion.      Vagina: Vaginal discharge present. No erythema or tenderness.     Cervix: Normal.     Uterus: Normal. Not enlarged and not tender.      Adnexa: Right adnexa normal and left adnexa normal.       Right: No mass or tenderness.         Left: No mass or tenderness.       Comments: BILAT LABIA WITH ERYTHEMA, SWELLING; NO ULCERATIVE LESIONS; PESSARY IN LOWER SEGMENT OF VAGINA Musculoskeletal:        General: Normal range of motion.     Cervical back: Normal range of motion.  Skin:    General: Skin is warm and dry.  Neurological:     General: No focal deficit present.     Mental Status: She is alert and oriented to person, place, and time.  Psychiatric:        Mood and Affect: Mood normal.        Behavior: Behavior normal.        Thought Content: Thought content normal.        Judgment: Judgment normal.     Results: Results for orders placed or performed in visit on 07/17/22 (from the past 24 hour(s))  POCT Wet Prep with KOH  Status: Abnormal   Collection Time: 07/17/22  4:53 PM  Result Value Ref Range   Trichomonas, UA Negative    Clue Cells Wet Prep HPF POC neg    Epithelial Wet Prep HPF POC     Yeast Wet Prep HPF POC pos    Bacteria Wet Prep HPF POC     RBC Wet Prep HPF POC     WBC Wet Prep HPF POC     KOH Prep POC Negative Negative     Assessment/Plan: Candidal vaginitis - Plan: fluconazole (DIFLUCAN) 150 MG tablet, POCT Wet Prep with KOH; pos sx and wet prep. Rx diflucan, OTC hydrocortisone crm in meantime/cool compresses. F/u prn.   Pelvic organ prolapse quantification stage 2 cystocele--pessary in lower vaginal canal. Has appt with urogyn 9/23.    Meds  ordered this encounter  Medications   fluconazole (DIFLUCAN) 150 MG tablet    Sig: Take 1 tablet (150 mg total) by mouth once for 1 dose. May repeat in 3 days if still having symptoms    Dispense:  2 tablet    Refill:  0    Order Specific Question:   Supervising Provider    Answer:   Rubie Maid [[id number]]      Return if symptoms worsen or fail to improve.  Tomara Youngberg B. Josmar Messimer, PA-C 07/17/2022 4:54 PM

## 2022-07-17 NOTE — Telephone Encounter (Signed)
Patient contacted office stating that she was recently prescribed antibiotic for UTI and is now experiencing vaginal itching and burning.

## 2022-07-17 NOTE — Telephone Encounter (Signed)
I contacted patient back who states that she was seen in office 06/25/22 for UTI like symptoms and pessary maintenance, patient states that she did taking antibiotic and dysuria resolved but believes do the pessary cleaning at visit that she has developed a vaginal infection. Patient denies history of yeast or BV, she denied vaginal discharge or odor but stated that she has a milky white discharge and external itching and believes the burning she is experiencing is from scratching. Advised patient office visit is needed and she was scheduled this afternoon. KW

## 2022-07-23 ENCOUNTER — Other Ambulatory Visit: Payer: Self-pay

## 2022-07-23 ENCOUNTER — Ambulatory Visit (INDEPENDENT_AMBULATORY_CARE_PROVIDER_SITE_OTHER): Payer: 59 | Admitting: Surgery

## 2022-07-23 ENCOUNTER — Encounter: Payer: Self-pay | Admitting: Surgery

## 2022-07-23 VITALS — BP 175/97 | HR 54 | Temp 97.9°F | Ht 61.5 in | Wt 147.0 lb

## 2022-07-23 DIAGNOSIS — D1722 Benign lipomatous neoplasm of skin and subcutaneous tissue of left arm: Secondary | ICD-10-CM

## 2022-07-23 DIAGNOSIS — Z09 Encounter for follow-up examination after completed treatment for conditions other than malignant neoplasm: Secondary | ICD-10-CM

## 2022-07-23 NOTE — Patient Instructions (Signed)
Please call if you have any questions or concerns.  °

## 2022-07-23 NOTE — Progress Notes (Signed)
07/23/2022  HPI: [name] is a 56 y.o. female s/p excision of left upper arm intramuscular lipoma on 07/08/22.  Patient presents for follow up.  She's been doing well, denies any worsening pain.  Has been following activity restrictions.  She mainly reports itching the incision site.  Vital signs: BP (!) 175/97   Pulse (!) 54   Temp 97.9 F (36.6 C) (Oral)   Ht 5' 1.5" (1.562 m)   Wt 147 lb (66.7 kg)   SpO2 99%   BMI 27.33 kg/m    Physical Exam: Constitutional:  No acute distress Skin:  Left upper arm incision is healing well, clean, dry, intact, with some DermaBond still in place.    Assessment/Plan: This is a 56 y.o. female s/p excision of left upper arm lipoma.  --Discussed pathology results with her, and that during surgery, the fatty tissue did not seem fully organized into a discrete mass, but I was able to excise the fatty tissue in the area where the MRI was pointing.  Path was benign. --She is healing well. Discuss activity restrictions still for 2 more weeks and will give her a work note for this. --Follow up as needed.   Melvyn Neth, [facility]

## 2022-07-24 ENCOUNTER — Ambulatory Visit
Admission: RE | Admit: 2022-07-24 | Discharge: 2022-07-24 | Disposition: A | Discharge status: Home / Self Care | Payer: No Typology Code available for payment source | Attending: Internal Medicine | Admitting: Internal Medicine

## 2022-07-24 ENCOUNTER — Ambulatory Visit
Admission: RE | Admit: 2022-07-24 | Discharge: 2022-07-24 | Disposition: A | Discharge status: Home / Self Care | Payer: No Typology Code available for payment source | Source: Ambulatory Visit | Attending: Internal Medicine | Admitting: Internal Medicine

## 2022-07-24 DIAGNOSIS — R9389 Abnormal findings on diagnostic imaging of other specified body structures: Secondary | ICD-10-CM

## 2022-08-11 ENCOUNTER — Other Ambulatory Visit: Payer: Self-pay | Admitting: Internal Medicine

## 2022-08-11 ENCOUNTER — Ambulatory Visit: Payer: 59 | Admitting: Obstetrics & Gynecology

## 2022-08-11 DIAGNOSIS — E041 Nontoxic single thyroid nodule: Secondary | ICD-10-CM

## 2022-08-12 ENCOUNTER — Encounter: Payer: Self-pay | Admitting: Orthopaedic Surgery

## 2022-08-12 ENCOUNTER — Ambulatory Visit (INDEPENDENT_AMBULATORY_CARE_PROVIDER_SITE_OTHER): Payer: 59 | Admitting: Orthopaedic Surgery

## 2022-08-12 DIAGNOSIS — S39012D Strain of muscle, fascia and tendon of lower back, subsequent encounter: Secondary | ICD-10-CM
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
## 2022-08-13 DIAGNOSIS — S39012A Strain of muscle, fascia and tendon of lower back, initial encounter: Secondary | ICD-10-CM | POA: Insufficient documentation

## 2022-08-13 NOTE — Progress Notes (Deleted)
Primary Care Provider: [name], MD Cardiologist: None Electrophysiologist: None  Clinic Note: No chief complaint on file.   ===================================  ASSESSMENT/PLAN   Problem List Items Addressed This Visit       Cardiology Problems   Essential hypertension (Chronic)   Hyperlipidemia associated with type 2 diabetes mellitus ([place]) - Primary (Chronic)     Other   DM (diabetes mellitus) type II controlled, neurological manifestation (HCC) (Chronic)   Palpitations   Heart murmur   ===================================  HPI:    [name] is a 56 y.o. female with a PMH notable for HTN, HLD and DM-2 (with neuropathy) who presents today for 43-monthfollow-up of Palpitations  RNEFERTITI MOHAMADwas last seen on 02/06/2022 as a 622-monthollow-up to discuss results of Coronary CTA, TTE and 7-day Zio patch monitor [facility]  She is feeling better.  Occasional episodes of fast heart rates but nothing prolonged.  Slight some right-sided chest discomfort after her contusion injury.  Noted some exertional dyspnea but happy to hear the results of her Echocardiogram and Coronary CTA. Increased a.m. dose of Lopressor to 100 mg daily with p.m. dose still 50. If BP was still high, consider titrating up losartan.  Recent Hospitalizations:  07/08/2022: Lipoma removal Left Arm  Reviewed  CV studies:    The following studies were reviewed today: (if available, images/films reviewed: From Epic Chart or Care Everywhere) None:   Interval History:   RoMervyn Gay CV Review of Symptoms (Summary) Cardiovascular ROS: {roscv:310661}  Cardiovascular ROS: positive for - chest pain, dyspnea on exertion, palpitations, rapid heart rate, and occasional dizziness but no real near syncope.  Chest discomfort is right-sided.  Mild exertional dyspnea that is more fatigued. negative for - edema, orthopnea, paroxysmal nocturnal dyspnea, shortness of breath, or  syncope/near syncope or TIA/amaurosis fugax, claudication  REVIEWED OF SYSTEMS   ROS Noted having some headaches and nausea.  Some dizziness.  Some exercise intolerance/fatigue.  I have reviewed and (if needed) personally updated the patient's problem list, medications, allergies, past medical and surgical history, social and family history.   PAST MEDICAL HISTORY   Past Medical History:  Diagnosis Date   Acute diverticulitis    Acute pain of right knee 10/14/2016   Acute renal failure (ARF) ([place]/04/2019   Anemia    Arthritis    Chronic back pain    Complicated by neuropathy. ->  On Neurontin and duloxetine along with Flexeril and Voltaren gel.  Also uses PRN tramadol.   [place]D with [place] ([place]   On combination of albuterol Qvar and Symbicort   Depression    Diabetes mellitus without complication ([place]   Diverticulitis large intestine 05/12/2020   Dyspnea    GERD (gastroesophageal reflux disease)    Heart murmur    Hypertension    Urinary incontinence    Pessary present    PAST SURGICAL HISTORY   Past Surgical History:  Procedure Laterality Date   7-Day Zio Patch Monitor  12/2021   Predominant sinus rhythm.  Rate range 38-136 bpm with an average of 80 bpm (bradycardia only noted during hours of sleep.  5 Atrial Runs-fastest was 5 beats at a max rate 162 bpm, longest was 17.2 seconds/33 beats at a rate of 119 bpm--not noted on patient trigger.  Frequent PACs noted (5.3%).  Rare PVCs.   ABDOMINAL HYSTERECTOMY     COLONOSCOPY WITH PROPOFOL N/A 10/02/2021   Procedure: COLONOSCOPY WITH PROPOFOL;  Surgeon:  Lin Landsman, MD;  Location: [facility] ENDOSCOPY;  Service: Gastroenterology;  Laterality: N/A;   Coronary CT angiogram  01/03/2022   Thoracic aortic atherosclerosis.  Coronary Calcium Score 1.29.  Minimal proximal LAD calcification. (<25%).   ESOPHAGOGASTRODUODENOSCOPY N/A 10/02/2021   Procedure: ESOPHAGOGASTRODUODENOSCOPY (EGD);  Surgeon: Lin Landsman, MD;   Location: [facility] ENDOSCOPY;  Service: Gastroenterology;  Laterality: N/A;   TRANSTHORACIC ECHOCARDIOGRAM  09/29/2015   EF 55-60%.  No R WMA.  GR 1 DD.  Is also normal valves.  Normal study.   TRANSTHORACIC ECHOCARDIOGRAM  01/09/2022   EF 60 to 65%.  Mild to moderate LVH.  G1 DD.  Normal RV size and function.  Normal atrial sizes.  Normal valves.  NORMAL ECHO   TUBAL LIGATION      Immunization History  Administered Date(s) Administered   Influenza,inj,Quad PF,6+ Mos 09/29/2015   Moderna Sars-Covid-2 Vaccination 03/19/2021, 04/18/2021   Pneumococcal Polysaccharide-23 09/29/2015    MEDICATIONS/ALLERGIES   No outpatient medications have been marked as taking for the 08/14/22 encounter (Appointment) with Leonie Man, MD.    No Known Allergies  SOCIAL HISTORY/FAMILY HISTORY   Reviewed in Epic:  Pertinent findings:  Social History   Tobacco Use   Smoking status: Former    Packs/day: 3.00    Years: 10.00    Total pack years: 30.00    Types: Cigarettes    Quit date: 12/22/2004    Years since quitting: 17.6   Smokeless tobacco: Never  Vaping Use   Vaping Use: Never used  Substance Use Topics   Alcohol use: No   Drug use: No   Social History   Social History Narrative   [facility] (07/20/17):   No bird or mold exposure. No recent travel.          [place] - urology    [facility] Manuella Ghazi - nuerology     OBJCTIVE -PE, EKG, labs   Wt Readings from Last 3 Encounters:  08/12/22 147 lb (66.7 kg)  07/23/22 147 lb (66.7 kg)  07/17/22 147 lb (66.7 kg)    Physical Exam: There were no vitals taken for this visit. Physical Exam Vitals reviewed.  Constitutional:      General: She is not in acute distress.    Appearance: Normal appearance. She is obese. She is not ill-appearing or toxic-appearing.  HENT:     Head: Normocephalic and atraumatic.  Neck:     Vascular: No carotid bruit.  Cardiovascular:     Rate  and Rhythm: Normal rate and regular rhythm.     Pulses: Normal pulses.     Heart sounds: Murmur (Soft 1/6 SEM at RUSB) heard.     No friction rub. No gallop.  Pulmonary:     Effort: Pulmonary effort is normal. No respiratory distress.     Breath sounds: Normal breath sounds. No wheezing, rhonchi or rales.  Musculoskeletal:        General: No swelling. Normal range of motion.     Cervical back: Normal range of motion and neck supple.  Skin:    General: Skin is warm and dry.  Neurological:     General: No focal deficit present.     Mental Status: She is alert and oriented to person, place, and time.     Gait: Gait normal.  Psychiatric:        Mood and Affect: Mood normal.  Behavior: Behavior normal.        Thought Content: Thought content normal.        Judgment: Judgment normal.     Adult ECG Report  Rate: *** ;  Rhythm: {rhythm:17366};   Narrative Interpretation: ***  Recent Labs:  ***  Lab Results  Component Value Date   CHOL 189 09/29/2015   HDL 47 09/29/2015   LDLCALC 121 (H) 09/29/2015   TRIG 107 09/29/2015   CHOLHDL 4.0 09/29/2015   Lab Results  Component Value Date   CREATININE 0.70 07/08/2022   BUN 19 07/08/2022   NA 138 07/08/2022   K 3.9 07/08/2022   CL 105 07/08/2022   CO2 21 (L) 06/20/2022      Latest Ref Rng & Units 07/08/2022    7:45 AM 06/20/2022    6:29 PM 01/31/2022   10:50 PM  CBC  WBC 4.0 - 10.5 K/uL  10.4  6.8   Hemoglobin 12.0 - 15.0 g/dL 13.6  13.1  12.1   Hematocrit 36.0 - 46.0 % 40.0  42.2  38.1   Platelets 150 - 400 K/uL  270  230     Lab Results  Component Value Date   HGBA1C 7.8 (H) 09/17/2021   Lab Results  Component Value Date   TSH 1.060 09/17/2021    ================================================== I spent a total of ***minutes with the patient spent in direct patient consultation.  Additional time spent with chart review  / charting (studies, outside notes, etc): *** min Total Time: *** min  Current medicines  are reviewed at length with the patient today.  (+/- concerns) ***  Notice: This dictation was prepared with Dragon dictation along with smart phrase technology. Any transcriptional errors that result from this process are unintentional and may not be corrected upon review.  Studies Ordered:  No orders of the defined types were placed in this encounter.  No orders of the defined types were placed in this encounter.   Patient Instructions / Medication Changes & Studies & Tests Ordered   There are no Patient Instructions on file for this visit.       Leonie Man, MD, MS Glenetta Hew, M.D., M.S. Interventional Cardiologist   [facility]   [address] [place], [place]  [address] [phone]           Fax [phone]    Thank you for choosing [facility] in [place]!!

## 2022-08-14 ENCOUNTER — Ambulatory Visit
Admission: RE | Admit: 2022-08-14 | Discharge: 2022-08-14 | Disposition: A | Discharge status: Home / Self Care | Payer: 59 | Source: Ambulatory Visit | Attending: Internal Medicine | Admitting: Internal Medicine

## 2022-08-14 ENCOUNTER — Ambulatory Visit: Payer: 59 | Admitting: Cardiology

## 2022-08-14 DIAGNOSIS — E041 Nontoxic single thyroid nodule: Secondary | ICD-10-CM

## 2022-08-14 DIAGNOSIS — I10 Essential (primary) hypertension: Secondary | ICD-10-CM

## 2022-08-14 DIAGNOSIS — E1140 Type 2 diabetes mellitus with diabetic neuropathy, unspecified: Secondary | ICD-10-CM

## 2022-08-14 DIAGNOSIS — R002 Palpitations: Secondary | ICD-10-CM

## 2022-08-14 DIAGNOSIS — R011 Cardiac murmur, unspecified: Secondary | ICD-10-CM

## 2022-08-14 DIAGNOSIS — E1169 Type 2 diabetes mellitus with other specified complication: Secondary | ICD-10-CM

## 2022-08-20 ENCOUNTER — Emergency Department (HOSPITAL_COMMUNITY): Payer: No Typology Code available for payment source

## 2022-08-20 ENCOUNTER — Encounter (HOSPITAL_COMMUNITY): Payer: Self-pay

## 2022-08-20 ENCOUNTER — Other Ambulatory Visit: Payer: Self-pay | Admitting: Family

## 2022-08-20 ENCOUNTER — Emergency Department (HOSPITAL_COMMUNITY)
Admission: EM | Admit: 2022-08-20 | Discharge: 2022-08-20 | Disposition: A | Discharge status: Home / Self Care | Payer: No Typology Code available for payment source | Attending: Emergency Medicine | Admitting: Emergency Medicine

## 2022-08-20 ENCOUNTER — Other Ambulatory Visit: Payer: Self-pay

## 2022-08-20 DIAGNOSIS — J449 Chronic obstructive pulmonary disease, unspecified: Secondary | ICD-10-CM | POA: Insufficient documentation

## 2022-08-20 DIAGNOSIS — E1165 Type 2 diabetes mellitus with hyperglycemia: Secondary | ICD-10-CM | POA: Insufficient documentation

## 2022-08-20 DIAGNOSIS — Z79899 Other long term (current) drug therapy: Secondary | ICD-10-CM | POA: Diagnosis not present

## 2022-08-20 DIAGNOSIS — R0789 Other chest pain: Secondary | ICD-10-CM | POA: Diagnosis not present

## 2022-08-20 DIAGNOSIS — R55 Syncope and collapse: Secondary | ICD-10-CM | POA: Insufficient documentation

## 2022-08-20 DIAGNOSIS — Z7982 Long term (current) use of aspirin: Secondary | ICD-10-CM | POA: Insufficient documentation

## 2022-08-20 DIAGNOSIS — M25511 Pain in right shoulder: Secondary | ICD-10-CM | POA: Insufficient documentation

## 2022-08-20 DIAGNOSIS — Z7984 Long term (current) use of oral hypoglycemic drugs: Secondary | ICD-10-CM | POA: Insufficient documentation

## 2022-08-20 DIAGNOSIS — T17308A Unspecified foreign body in larynx causing other injury, initial encounter: Secondary | ICD-10-CM

## 2022-08-20 DIAGNOSIS — Z7951 Long term (current) use of inhaled steroids: Secondary | ICD-10-CM | POA: Insufficient documentation

## 2022-08-20 DIAGNOSIS — I10 Essential (primary) hypertension: Secondary | ICD-10-CM | POA: Insufficient documentation

## 2022-08-20 DIAGNOSIS — R0989 Other specified symptoms and signs involving the circulatory and respiratory systems: Secondary | ICD-10-CM | POA: Diagnosis not present

## 2022-08-20 LAB — BASIC METABOLIC PANEL
Anion gap: 9 (ref 5–15)
BUN: 10 mg/dL (ref 6–20)
CO2: 27 mmol/L (ref 22–32)
Calcium: 9.6 mg/dL (ref 8.9–10.3)
Chloride: 105 mmol/L (ref 98–111)
Creatinine, Ser: 0.91 mg/dL (ref 0.44–1.00)
GFR, Estimated: >60 mL/min (ref >60)
Glucose, Bld: 290 mg/dL — ABNORMAL HIGH (ref 70–99)
Potassium: 3 mmol/L — ABNORMAL LOW (ref 3.5–5.1)
Sodium: 141 mmol/L (ref 135–145)

## 2022-08-20 LAB — CBC
HCT: 40.7 % (ref 36.0–46.0)
Hemoglobin: 13.3 g/dL (ref 12.0–15.0)
MCH: 30.1 pg (ref 26.0–34.0)
MCHC: 32.7 g/dL (ref 30.0–36.0)
MCV: 92.1 fL (ref 80.0–100.0)
Platelets: 223 10*3/uL (ref 150–400)
RBC: 4.42 MIL/uL (ref 3.87–5.11)
RDW: 15.4 % (ref 11.5–15.5)
WBC: 7.2 10*3/uL (ref 4.0–10.5)
nRBC: 0 % (ref 0.0–0.2)

## 2022-08-20 LAB — I-STAT BETA HCG BLOOD, ED (MC, WL, AP ONLY): I-stat hCG, quantitative: <5 m[IU]/mL (ref <5)

## 2022-08-20 LAB — TROPONIN I (HIGH SENSITIVITY)
Troponin I (High Sensitivity): 10 ng/L (ref <18)
Troponin I (High Sensitivity): 11 ng/L (ref <18)

## 2022-08-20 MED ORDER — HYDROXYZINE HCL 25 MG PO TABS
25.0000 mg | ORAL_TABLET | Freq: Once | ORAL | Status: AC
  Administered 2022-08-20: 25 mg via ORAL
  Filled 2022-08-20: qty 1

## 2022-08-20 MED ORDER — HYDRALAZINE HCL 20 MG/ML IJ SOLN
10.0000 mg | Freq: Once | INTRAMUSCULAR | Status: AC
  Administered 2022-08-20: 10 mg via INTRAVENOUS
  Filled 2022-08-20: qty 1

## 2022-08-20 MED ORDER — HYDRALAZINE HCL 20 MG/ML IJ SOLN: 10.0000 mg | Freq: Once | INTRAMUSCULAR | Status: DC

## 2022-08-20 MED ORDER — AMLODIPINE BESYLATE 5 MG PO TABS: 5.0000 mg | ORAL_TABLET | Freq: Once | ORAL | Status: DC

## 2022-08-20 MED ORDER — ACETAMINOPHEN 325 MG PO TABS
650.0000 mg | ORAL_TABLET | ORAL | Status: AC
  Administered 2022-08-20: 650 mg via ORAL
  Filled 2022-08-20: qty 2

## 2022-08-20 MED ORDER — SODIUM CHLORIDE 0.9 % IV BOLUS
500.0000 mL | Freq: Once | INTRAVENOUS | Status: AC
  Administered 2022-08-20: 500 mL via INTRAVENOUS

## 2022-08-20 MED ORDER — AMLODIPINE BESYLATE 5 MG PO TABS
10.0000 mg | ORAL_TABLET | Freq: Once | ORAL | Status: AC
  Administered 2022-08-20: 10 mg via ORAL
  Filled 2022-08-20: qty 2

## 2022-08-20 MED ORDER — ACETAMINOPHEN 500 MG PO TABS: 1000.0000 mg | ORAL_TABLET | Freq: Four times a day (QID) | ORAL | 0 refills | Status: DC | PRN

## 2022-08-20 MED ORDER — OXYCODONE-ACETAMINOPHEN 5-325 MG PO TABS
1.0000 | ORAL_TABLET | Freq: Once | ORAL | Status: AC
  Administered 2022-08-20: 1 via ORAL
  Filled 2022-08-20: qty 1

## 2022-08-20 MED ORDER — ALUM & MAG HYDROXIDE-SIMETH 200-200-20 MG/5ML PO SUSP
30.0000 mL | Freq: Once | ORAL | Status: AC
  Administered 2022-08-20: 30 mL via ORAL
  Filled 2022-08-20: qty 30

## 2022-08-20 MED ORDER — POTASSIUM CHLORIDE CRYS ER 20 MEQ PO TBCR
40.0000 meq | EXTENDED_RELEASE_TABLET | Freq: Once | ORAL | Status: AC
  Administered 2022-08-20: 40 meq via ORAL
  Filled 2022-08-20: qty 2

## 2022-08-20 MED ORDER — IBUPROFEN 600 MG PO TABS: 600.0000 mg | ORAL_TABLET | Freq: Four times a day (QID) | ORAL | 0 refills | Status: DC | PRN

## 2022-08-20 MED ORDER — LIDOCAINE VISCOUS HCL 2 % MT SOLN
15.0000 mL | Freq: Once | OROMUCOSAL | Status: AC
  Administered 2022-08-20: 15 mL via ORAL
  Filled 2022-08-20: qty 15

## 2022-08-20 NOTE — ED Provider Notes (Signed)
[facility] EMERGENCY DEPARTMENT Provider Note   CSN: [id number] Arrival date & time: 08/20/22  1031     History {Add pertinent medical, surgical, social history, OB history to HPI:1} Chief Complaint  Patient presents with  . Panic Attack  . Chest Pain    [name] is a 56 y.o. female.   Chest Pain         Home Medications Prior to Admission medications   Medication Sig Start Date End Date Taking? Authorizing Provider  acetaminophen (TYLENOL) 325 MG tablet Take 2 tablets (650 mg total) by mouth every 6 (six) hours as needed for mild pain or fever. 05/13/20   Lorella Nimrod, MD  acetaminophen (TYLENOL) 500 MG tablet Take 2 tablets (1,000 mg total) by mouth every 6 (six) hours as needed for mild pain. 07/08/22   Olean Ree, MD  albuterol (PROVENTIL) (2.5 MG/3ML) 0.083% nebulizer solution Take 3 mLs (2.5 mg total) by nebulization every 2 (two) hours as needed for wheezing or shortness of breath. 01/28/19   Lule, Sara Chu, PA  albuterol (VENTOLIN HFA) 108 (90 Base) MCG/ACT inhaler Inhale 2 puffs into the lungs every 6 (six) hours as needed for wheezing or shortness of breath. 01/01/22   Tyler Pita, MD  aspirin EC 81 MG tablet Take 81 mg by mouth daily.    [provider]  atorvastatin (LIPITOR) 10 MG tablet Take 10 mg by mouth daily.    [provider]  budesonide-formoterol (SYMBICORT) 160-4.5 MCG/ACT inhaler Inhale 2 puffs into the lungs 2 (two) times daily. 01/01/22   Tyler Pita, MD  Cholecalciferol (VITAMIN D3) 1.25 MG (50000 UT) CAPS Take 1 capsule by mouth once a week. 02/09/20   [provider]  diclofenac sodium (VOLTAREN) 1 % GEL Apply 2 g topically 4 (four) times daily as needed (pain). 01/28/19   Ripley Fraise, PA  DULoxetine (CYMBALTA) 20 MG capsule Take 20 mg by mouth daily. 07/11/21 07/11/22  [provider]  fluticasone-salmeterol (ADVAIR DISKUS) 250-50 MCG/ACT AEPB Inhale 1 puff into the lungs in the  morning and at bedtime. 01/06/22   Tyler Pita, MD  gabapentin (NEURONTIN) 600 MG tablet Take by mouth 2 (two) times daily. 09/17/20   [provider]  glipiZIDE (GLUCOTROL) 5 MG tablet Take 1 tablet (5 mg total) by mouth every morning. 01/28/19   Ripley Fraise, PA  ibuprofen (ADVIL) 800 MG tablet Take 1 tablet (800 mg total) by mouth every 8 (eight) hours as needed for moderate pain. 07/08/22   Olean Ree, MD  ibuprofen (ADVIL) 800 MG tablet Take 1 tablet (800 mg total) by mouth every 8 (eight) hours as needed for mild pain. 07/15/22   Suzan Slick, NP  loratadine (CLARITIN) 10 MG tablet Take 1 tablet (10 mg total) by mouth daily. 01/28/19   Ripley Fraise, PA  losartan (COZAAR) 50 MG tablet Take 1 tablet (50 mg total) by mouth daily. 01/28/19   Ripley Fraise, PA  metFORMIN (GLUCOPHAGE) 1000 MG tablet Take 1 tablet (1,000 mg total) by mouth 2 (two) times daily with a meal. See discharge instructions for how to take medicine at first 01/28/19   Lind, [place], PA  methocarbamol (ROBAXIN) 500 MG tablet TAKE 1 TABLET BY MOUTH EVERY 6 HOURS AS NEEDED FOR MUSCLE SPASMS 08/20/22   Suzan Slick, NP  metoprolol tartrate (LOPRESSOR) 50 MG tablet Take 100 mg (2 tablets) in the morning and 50 mg (1 tablet) in the evening. You may take 25 mg (  1/2) extra daily as needed for palpitations. 02/06/22   Leonie Man, MD  omeprazole (PRILOSEC) 40 MG capsule Take 1 capsule (40 mg total) by mouth daily. 01/01/22   Tyler Pita, MD  ondansetron (ZOFRAN-ODT) 4 MG disintegrating tablet Take 1 tablet (4 mg total) by mouth every 6 (six) hours as needed for nausea or vomiting. 06/21/22   Ward, Delice Bison, DO  oxyCODONE (OXY IR/ROXICODONE) 5 MG immediate release tablet Take 1 tablet (5 mg total) by mouth every 4 (four) hours as needed for severe pain. 07/08/22   Olean Ree, MD  oxyCODONE-acetaminophen (PERCOCET/ROXICET) 5-325 MG tablet Take 1 tablet by mouth every 8 (eight) hours as needed for severe pain. 07/15/22    Suzan Slick, NP  polyethylene glycol (MIRALAX / GLYCOLAX) 17 g packet Take 17 g by mouth 2 (two) times daily. 05/13/20   Lorella Nimrod, MD  Semaglutide (RYBELSUS) 7 MG TABS Take by mouth daily.    [provider]  sertraline (ZOLOFT) 50 MG tablet Take 1.5 tablets (75 mg total) by mouth every morning. 01/28/19   Ripley Fraise, PA      Allergies    Patient has no known allergies.    Review of Systems   Review of Systems  Cardiovascular:  Positive for chest pain.    Physical Exam Updated Vital Signs BP (!) 192/105 (BP Location: Left Arm)   Pulse (!) 56   Temp 97.9 F (36.6 C) (Oral)   Resp 15   Ht 5' 1.5" (1.562 m)   Wt 71.2 kg   SpO2 100%   BMI 29.18 kg/m  Physical Exam  ED Results / Procedures / Treatments   Labs (all labs ordered are listed, but only abnormal results are displayed) Labs Reviewed  BASIC METABOLIC PANEL - Abnormal; Notable for the following components:      Result Value   Potassium 3.0 (*)    Glucose, Bld 290 (*)    All other components within normal limits  CBC  URINALYSIS, ROUTINE W REFLEX MICROSCOPIC  PREGNANCY, URINE  I-STAT BETA HCG BLOOD, ED ([facility], [facility], [facility] ONLY)  TROPONIN I (HIGH SENSITIVITY)  TROPONIN I (HIGH SENSITIVITY)    EKG EKG Interpretation  Date/Time:  Wednesday August 20 2022 11:01:57 EDT Ventricular Rate:  60 PR Interval:  156 QRS Duration: 76 QT Interval:  434 QTC Calculation: 434 R Axis:   -12 Text Interpretation: Normal sinus rhythm Minimal voltage criteria for LVH, may be normal variant ( Cornell product ) Borderline ECG When compared with ECG of 20-Jun-2022 18:32, PREVIOUS ECG IS PRESENT Confirmed by Lennice Sites ([id number]) on 08/20/2022 12:01:14 PM  Radiology CT HEAD WO CONTRAST (5MM)  Result Date: 08/20/2022 CLINICAL DATA:  Head trauma. EXAM: CT HEAD WITHOUT CONTRAST TECHNIQUE: Contiguous axial images were obtained from the base of the skull through the vertex without intravenous contrast. RADIATION DOSE REDUCTION:  This exam was performed according to the departmental dose-optimization program which includes automated exposure control, adjustment of the mA and/or kV according to patient size and/or use of iterative reconstruction technique. COMPARISON:  June 20, 2022. FINDINGS: Brain: No evidence of acute infarction, hemorrhage, hydrocephalus, extra-axial collection or mass lesion/mass effect. Vascular: No hyperdense vessel or unexpected calcification. Skull: Normal. Negative for fracture or focal lesion. Sinuses/Orbits: No acute finding. Other: None. IMPRESSION: No acute intracranial abnormality seen. Electronically Signed   By: Marijo Conception M.D.   On: 08/20/2022 13:54   DG Chest 2 View  Result Date: 08/20/2022 CLINICAL DATA:  Chest pain.  Panic attack. EXAM: CHEST - 2 VIEW COMPARISON:  07/24/2022 FINDINGS: Heart size and mediastinal contours are unremarkable. There is no pleural effusion or edema identified. No airspace opacities. Visualized osseous structures are unremarkable. IMPRESSION: No active cardiopulmonary disease. Electronically Signed   By: Kerby Moors M.D.   On: 08/20/2022 11:30    Procedures Procedures  {Document cardiac monitor, telemetry assessment procedure when appropriate:1}  Medications Ordered in ED Medications  hydrALAZINE (APRESOLINE) injection 10 mg (10 mg Intravenous Given 08/20/22 1248)  oxyCODONE-acetaminophen (PERCOCET/ROXICET) 5-325 MG per tablet 1 tablet (1 tablet Oral Given 08/20/22 1353)  hydrOXYzine (ATARAX) tablet 25 mg (25 mg Oral Given 08/20/22 1353)  potassium chloride SA (KLOR-CON M) CR tablet 40 mEq (40 mEq Oral Given 08/20/22 1353)  sodium chloride 0.9 % bolus 500 mL (500 mLs Intravenous New Bag/Given 08/20/22 1357)  acetaminophen (TYLENOL) tablet 650 mg (650 mg Oral Given 08/20/22 1352)  alum & mag hydroxide-simeth (MAALOX/MYLANTA) 200-200-20 MG/5ML suspension 30 mL (30 mLs Oral Given 08/20/22 1402)    And  lidocaine (XYLOCAINE) 2 % viscous mouth solution 15 mL (15  mLs Oral Given 08/20/22 1402)    ED Course/ Medical Decision Making/ A&P                           Medical Decision Making Amount and/or Complexity of Data Reviewed Labs: ordered. Radiology: ordered.  Risk OTC drugs. Prescription drug management.   ***  {Document critical care time when appropriate:1} {Document review of labs and clinical decision tools ie heart score, Chads2Vasc2 etc:1}  {Document your independent review of radiology images, and any outside records:1} {Document your discussion with family members, caretakers, and with consultants:1} {Document social determinants of health affecting pt's care:1} {Document your decision making why or why not admission, treatments were needed:1} Final Clinical Impression(s) / ED Diagnoses Final diagnoses:  None    Rx / DC Orders ED Discharge Orders     None

## 2022-08-20 NOTE — ED Notes (Signed)
Patient transported to X-ray 

## 2022-08-20 NOTE — Discharge Instructions (Signed)
Please take your prescribed medications.  Please take your pills 1 at a time.  That way you do not choke on them again.  Please make sure you drink plenty of water.  Please follow-up with your family care provider you may talk to them about the choking episodes you are having with swallowing pills.  It may be reasonable to follow-up with a gastroenterologist.

## 2022-08-20 NOTE — ED Notes (Signed)
Patient transported to CT 

## 2022-08-20 NOTE — ED Triage Notes (Signed)
Pt BIB [facility] from home c/o a panic attack. Pt was trying to take meds this morning and felt like a pill got stuck. Pt threw up and then started shaking all over. When EMS arrived pt was lying in the floor. Pt was sternal rubbed and immediately came to and was alert and oriented x4. Pt has a hx of panic attacks and thinks that is what happened because the symptoms were the same. Marland Kitchen

## 2022-08-20 NOTE — ED Notes (Signed)
Pt back from x-ray.

## 2022-08-21 ENCOUNTER — Ambulatory Visit
Admission: RE | Admit: 2022-08-21 | Discharge: 2022-08-21 | Disposition: A | Discharge status: Home / Self Care | Payer: No Typology Code available for payment source | Source: Ambulatory Visit | Attending: Internal Medicine | Admitting: Internal Medicine

## 2022-08-21 ENCOUNTER — Other Ambulatory Visit: Payer: Self-pay | Admitting: Family

## 2022-08-21 DIAGNOSIS — E041 Nontoxic single thyroid nodule: Secondary | ICD-10-CM | POA: Insufficient documentation

## 2022-08-21 NOTE — Discharge Instructions (Signed)
Needle Biopsy, Care After These instructions tell you how to care for yourself after your procedure. Your doctor may also give you more specific instructions. Call your doctor if you have any problems or questions. What can I expect after the procedure? After the procedure, it is common to have: Soreness. Bruising. Mild pain. Follow these instructions at home:  Return to your normal activities tomorrow. Take over-the-counter and prescription medicines only as told by your doctor. Wash your hands with soap and water before you change your bandage (dressing). If you cannot use soap and water, use hand sanitizer. You may remove your bandage in 2 days Check your puncture site every day for signs of infection. Watch for: Redness, swelling, or pain. Fluid or blood.  Pus or a bad smell. Warmth. You can shower tomorrow Keep all follow-up visits as told by your doctor. This is important. Contact a doctor if you have: A fever. Redness, swelling, or pain at the puncture site, and it lasts longer than a few days. Fluid, blood, or pus coming from the puncture site. Warmth coming from the puncture site. Get help right away if: You have a lot of bleeding from the puncture site. Summary After the procedure, it is common to have soreness, bruising, or mild pain at the puncture site. Check your puncture site every day for signs of infection, such as redness, swelling, or pain. Get help right away if you have severe bleeding from your puncture site. This information is not intended to replace advice given to you by your health care provider. Make sure you discuss any questions you have with your health care provider. Document Revised: 12/21/2017 Document Reviewed: 12/21/2017 Elsevier Patient Education  2020 Elsevier Inc.  

## 2022-08-21 NOTE — Procedures (Signed)
Successful US guided FNA of right inferior thyroid nodule No complications. See PACS for full report.  Hedy Jacob, PA-C 08/21/2022, 3:07 PM

## 2022-08-22 ENCOUNTER — Ambulatory Visit (INDEPENDENT_AMBULATORY_CARE_PROVIDER_SITE_OTHER): Payer: BLUE CROSS/BLUE SHIELD | Admitting: Obstetrics and Gynecology

## 2022-08-22 ENCOUNTER — Encounter: Payer: Self-pay | Admitting: Obstetrics and Gynecology

## 2022-08-22 VITALS — BP 165/105 | HR 97 | Ht <= 58 in | Wt 149.0 lb

## 2022-08-22 DIAGNOSIS — N816 Rectocele: Secondary | ICD-10-CM

## 2022-08-22 DIAGNOSIS — N393 Stress incontinence (female) (male): Secondary | ICD-10-CM

## 2022-08-22 DIAGNOSIS — N3281 Overactive bladder: Secondary | ICD-10-CM

## 2022-08-22 DIAGNOSIS — N8110 Cystocele, unspecified: Secondary | ICD-10-CM

## 2022-08-22 DIAGNOSIS — N993 Prolapse of vaginal vault after hysterectomy: Secondary | ICD-10-CM

## 2022-08-22 DIAGNOSIS — R350 Frequency of micturition: Secondary | ICD-10-CM

## 2022-08-22 DIAGNOSIS — M62838 Other muscle spasm: Secondary | ICD-10-CM

## 2022-08-22 LAB — POCT URINALYSIS DIPSTICK
Bilirubin, UA: NEGATIVE
Blood, UA: NEGATIVE
Glucose, UA: POSITIVE — AB
Ketones, UA: NEGATIVE
Leukocytes, UA: NEGATIVE
Nitrite, UA: NEGATIVE
Protein, UA: POSITIVE — AB
Spec Grav, UA: >=1.03 — AB (ref 1.010–1.025)
Urobilinogen, UA: 1 E.U./dL
pH, UA: 5.5 (ref 5.0–8.0)

## 2022-08-22 LAB — CYTOLOGY - NON PAP

## 2022-08-22 NOTE — Patient Instructions (Addendum)
Today we talked about ways to manage bladder urgency such as altering your diet to avoid irritative beverages and foods (bladder diet) as well as attempting to decrease stress and other exacerbating factors.    The Most Bothersome Foods* The Least Bothersome Foods*  Coffee - Regular & Decaf Tea - caffeinated Carbonated beverages - cola, non-colas, diet & caffeine-free Alcohols - Beer, Red Wine, White Wine, Champagne Fruits - Grapefruit, Berkley, Orange, [employer] - Cranberry, Grapefruit, Orange, Pineapple Vegetables - Tomato & Tomato Products Flavor Enhancers - Hot peppers, Spicy foods, Chili, Horseradish, Vinegar, Monosodium glutamate (MSG) Artificial Sweeteners - NutraSweet, Sweet 'N Low, Equal (sweetener), Saccharin Ethnic foods - [place], [place], [place] food Express Scripts - low-fat & whole Fruits - Bananas, Blueberries, Honeydew melon, Pears, Raisins, Watermelon Vegetables - Broccoli, Brussels Sprouts, [place], Carrots, Cauliflower, [place], Cucumber, Mushrooms, Peas, Radishes, Squash, Zucchini, White potatoes, Sweet potatoes & yams Poultry - Chicken, Eggs, [place], [employer] - Beef, Programmer, multimedia, Lamb Seafood - Shrimp, [place] fish, Salmon Grains - Oat, Rice Snacks - Pretzels, Popcorn  *Lissa Morales et al. Diet and its role in interstitial cystitis/bladder pain syndrome (IC/BPS) and comorbid conditions. [place] 2012 Jan 11.    URODYNAMICS (UDS) TEST INFORMATION  IMPORTANT: Please try to arrive with a comfortably full bladder!   What is UDS? Urodynamics is a bladder test used to evaluate how your bladder and urethra (tube you urinate out of) work to help find out the cause of your bladder symptoms and evaluate your bladder function in order to make the best treatment plan for you.   What to expect? A nurse will perform the test and will be with you during the entire exam. First we will have to empty your bladder on a special toilet.  After you have emptied your bladder,  very small catheters (plastic tubing) will be placed into your bladder and into your vagina (or rectum). These special small catheters measure pressure to help measure your bladder function.  Your bladder will be gently filled with water and you will be asked to cough and strain at several different points during the test.   You will then be asked to empty your bladder in the special toilet with the catheters in place. Most patients can urinate (pee) easily with the catheters in place since the catheters are so small. In total this procedure lasts about 45 minutes to 1 hour.  After your test is completed, you will return (or possibly be seen the same day) to review the results, talk about treatment options and make a plan moving forward.
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
## 2022-08-27 ENCOUNTER — Encounter: Payer: Self-pay | Admitting: Nurse Practitioner

## 2022-08-27 ENCOUNTER — Ambulatory Visit: Payer: Self-pay | Attending: Cardiology | Admitting: Nurse Practitioner

## 2022-08-27 VITALS — BP 148/92 | HR 81 | Ht <= 58 in | Wt 150.0 lb

## 2022-08-27 DIAGNOSIS — Z79899 Other long term (current) drug therapy: Secondary | ICD-10-CM

## 2022-08-27 DIAGNOSIS — E785 Hyperlipidemia, unspecified: Secondary | ICD-10-CM

## 2022-08-27 DIAGNOSIS — R002 Palpitations: Secondary | ICD-10-CM

## 2022-08-27 DIAGNOSIS — G4730 Sleep apnea, unspecified: Secondary | ICD-10-CM

## 2022-08-27 DIAGNOSIS — I10 Essential (primary) hypertension: Secondary | ICD-10-CM

## 2022-08-27 DIAGNOSIS — E1169 Type 2 diabetes mellitus with other specified complication: Secondary | ICD-10-CM

## 2022-08-27 MED ORDER — LOSARTAN POTASSIUM 100 MG PO TABS
100.0000 mg | ORAL_TABLET | Freq: Every day | ORAL | 3 refills | Status: DC
Start: 2022-08-27 — End: 2023-03-27

## 2022-08-27 NOTE — Patient Instructions (Signed)
Medication Instructions:  - Your physician has recommended you make the following change in your medication:   1) INCREASE Losartan to 100 mg: - take 1 tablet by mouth once daily   *If you need a refill on your cardiac medications before your next appointment, please call your pharmacy*   Lab Work: - Your physician recommends that you return for lab work in: 1 week  [place] at [facility] 1st desk on the right to check in (REGISTRATION)  Lab hours: Monday- Friday (7:30 am- 5:30 pm)   If you have labs (blood work) drawn today and your tests are completely normal, you will receive your results only by: MyChart Message (if you have MyChart) OR A paper copy in the mail If you have any lab test that is abnormal or we need to change your treatment, we will call you to review the results.   Testing/Procedures: - none ordered   Follow-Up: At [facility], you and your health needs are our priority.  As part of our continuing mission to provide you with exceptional heart care, we have created designated Provider Care Teams.  These Care Teams include your primary Cardiologist (physician) and Advanced Practice Providers (APPs -  Physician Assistants and Nurse Practitioners) who all work together to provide you with the care you need, when you need it.  We recommend signing up for the patient portal called "MyChart".  Sign up information is provided on this After Visit Summary.  MyChart is used to connect with patients for Virtual Visits (Telemedicine).  Patients are able to view lab/test results, encounter notes, upcoming appointments, etc.  Non-urgent messages can be sent to your provider as well.   To learn more about what you can do with MyChart, go to [email or web address].    Your next appointment:   6 month(s)  The format for your next appointment:   In Person  Provider:   You may see Glenetta Hew, MD or one of the following Advanced Practice Providers on  your designated Care Team:   Murray Hodgkins, NP     Other Instructions N/a  Important Information About Sugar
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
## 2022-09-10 ENCOUNTER — Ambulatory Visit (INDEPENDENT_AMBULATORY_CARE_PROVIDER_SITE_OTHER): Payer: Self-pay | Admitting: Orthopaedic Surgery

## 2022-09-10 ENCOUNTER — Encounter: Payer: Self-pay | Admitting: Orthopaedic Surgery

## 2022-09-10 VITALS — BP 165/96 | Ht <= 58 in | Wt 150.0 lb

## 2022-09-10 DIAGNOSIS — S39012D Strain of muscle, fascia and tendon of lower back, subsequent encounter: Secondary | ICD-10-CM
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
## 2022-09-12 ENCOUNTER — Ambulatory Visit: Payer: 59 | Admitting: Orthopaedic Surgery

## 2022-09-15 ENCOUNTER — Encounter: Payer: Self-pay | Admitting: Oncology

## 2022-09-15 ENCOUNTER — Inpatient Hospital Stay: Payer: Medicare Other | Attending: Oncology | Admitting: Oncology

## 2022-09-15 ENCOUNTER — Inpatient Hospital Stay: Payer: Medicare Other

## 2022-09-15 VITALS — BP 190/113 | HR 59 | Temp 96.7°F | Resp 16 | Wt 156.2 lb

## 2022-09-15 DIAGNOSIS — E041 Nontoxic single thyroid nodule: Secondary | ICD-10-CM | POA: Insufficient documentation

## 2022-09-15 DIAGNOSIS — E279 Disorder of adrenal gland, unspecified: Secondary | ICD-10-CM | POA: Diagnosis not present

## 2022-09-15 DIAGNOSIS — C73 Malignant neoplasm of thyroid gland: Secondary | ICD-10-CM

## 2022-09-15 DIAGNOSIS — Z87891 Personal history of nicotine dependence: Secondary | ICD-10-CM | POA: Diagnosis not present

## 2022-09-15 DIAGNOSIS — I10 Essential (primary) hypertension: Secondary | ICD-10-CM | POA: Diagnosis not present

## 2022-09-15 DIAGNOSIS — E119 Type 2 diabetes mellitus without complications: Secondary | ICD-10-CM | POA: Insufficient documentation
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
## 2022-09-15 NOTE — Progress Notes (Signed)
Pt states shes in 8/10 back pain this morning and has not taken any medication ;ate a pack of crackers and juice this morning.

## 2022-09-18 ENCOUNTER — Telehealth: Payer: Self-pay

## 2022-09-18 ENCOUNTER — Encounter: Payer: Self-pay | Admitting: Obstetrics and Gynecology

## 2022-09-18 ENCOUNTER — Ambulatory Visit (INDEPENDENT_AMBULATORY_CARE_PROVIDER_SITE_OTHER): Payer: Medicare Other | Admitting: Obstetrics and Gynecology

## 2022-09-18 VITALS — BP 179/121 | HR 74 | Wt 156.0 lb

## 2022-09-18 DIAGNOSIS — R350 Frequency of micturition: Secondary | ICD-10-CM

## 2022-09-18 DIAGNOSIS — N3281 Overactive bladder: Secondary | ICD-10-CM | POA: Diagnosis not present

## 2022-09-18 DIAGNOSIS — N393 Stress incontinence (female) (male): Secondary | ICD-10-CM | POA: Diagnosis not present

## 2022-09-18 NOTE — Patient Instructions (Signed)
Taking Care of Yourself after Urodynamics, Cystoscopy, Coaptite Injection, or Botox Injection   Drink plenty of water for a day or two following your procedure. Try to have about 8 ounces (one cup) at a time, and do this 6 times or more per day unless you have fluid restrictitons AVOID irritative beverages such as coffee, tea, soda, alcoholic or citrus drinks for a day or two, as this may cause burning with urination.  For the first 1-2 days after the procedure, your urine may be pink or red in color. You may have some blood in your urine as a normal side effect of the procedure. Large amounts of bleeding or difficulty urinating are NOT normal. Call the nurse line if this happens or go to the nearest Emergency Room if the bleeding is heavy or you cannot urinate at all and it is after hours.   You can use over the counter Azo or pyridium to help with burning and follow the instructions on the packaging. If it does not improve within 1-2 days, or other symptoms appear (fever, chills, or difficulty urinating) call the office to speak to a nurse.  You may return to normal daily activities such as work, school, driving, exercising and housework on the day of the procedure. If you need a return appointment, the front desk staff will arrange it when you check out.

## 2022-09-18 NOTE — Telephone Encounter (Signed)
Reached out to [facility] regarding pts referral. Per their front desk pt has been scheduled for 09/29/22 at 2:20pm with Dr. Daisy Blossom.

## 2022-09-18 NOTE — Progress Notes (Signed)
[facility] Urodynamics Procedure  Referring Physician: [name], MD Date of Procedure: 09/18/2022  [name] is a 56 y.o. female who presents for urodynamic evaluation. Indication(s) for study: mixed incontinence  Vital Signs: BP (!) 179/121   Pulse 74   Wt 156 lb (70.8 kg)   BMI 32.60 kg/m   Laboratory Results: A catheterized urine specimen revealed:  '@ENCLABS'$ @ '@LABRESULTS'$ @   Voiding Diary: Not performed  Procedure Timeout:  The correct patient was verified and the correct procedure was verified. The patient was in the correct position and safety precautions were reviewed based on at the patient's history.  Urodynamic Procedure A 31F dual lumen urodynamics catheter was placed under sterile conditions into the patient's bladder. A 31F catheter was placed into the rectum in order to measure abdominal pressure. EMG patches were placed in the appropriate position.  All connections were confirmed and calibrations/adjusted made. Saline was instilled into the bladder through the dual lumen catheters.  Cough/valsalva pressures were measured periodically during filling.  Patient was allowed to void.  The bladder was then emptied of its residual. She had a ring pessary in place throughout the study to reduce her prolapse.   UROFLOW: Uroflow did not record. Patient voided 173m with a PVR of 134m   CMG: This was performed with sterile water in the sitting position at a fill rate of 10- 30 mL/min.    First sensation of fullness was 98 mLs,  First urge was 116 mLs,  Strong urge was 118 mLs and  Capacity was 156.6 mLs  Stress incontinence was demonstrated  Lowest positive Barrier CLPP was 108 cmH20 at 136 ml. Lowest positive Barrier VLPP was 83 cmH20 at 136 ml.  Detrusor function was overactive, with phasic contractions seen.  The first occurred at 65 mL to 3.2 cm of water and was associated with urge. She had several contractions throughout the study with  the highest Pdet of 21 cm H20.   Compliance:  low. End fill detrusor pressure was 11cmH20.  Calculated compliance was 13.78m35mmH20  UPP: Unable to perform due to strong urgency.   MICTURITION STUDY: Voiding was performed with reduction using pessary in the sitting position.  Pdet at Qmax was 28.7 cm of water.  Qmax was 25.1 mL/sec.  It was a normal pattern.  She voided 151.6 mL and had a residual of 5 mL.  It was a volitional void, sustained detrusor contraction was present and abdominal straining was not present  EMG: This was performed with patches.  She had voluntary contractions,  urethral sphincter was relaxed with void.  The details of the procedure with the study tracings have been scanned into EPIC.   Urodynamic Impression:  1. Sensation was increased; capacity was reduced 2. Stress Incontinence was demonstrated at normal pressures; 3. Detrusor Overactivity was demonstrated without leakage. 4. Emptying was normal with a normal PVR, a sustained detrusor contraction present,  abdominal straining not present, normal urethral sphincter activity on EMG.  Plan: - The patient will follow up  to discuss the findings and treatment options.

## 2022-09-19 LAB — POCT URINALYSIS DIPSTICK
Bilirubin, UA: NEGATIVE
Blood, UA: NEGATIVE
Glucose, UA: POSITIVE — AB
Ketones, UA: NEGATIVE
Leukocytes, UA: NEGATIVE
Nitrite, UA: NEGATIVE
Protein, UA: POSITIVE — AB
Spec Grav, UA: >=1.03 — AB (ref 1.010–1.025)
Urobilinogen, UA: 1 E.U./dL
pH, UA: 6.5 (ref 5.0–8.0)

## 2022-09-19 NOTE — Addendum Note (Signed)
Addended by: Elita Quick on: 09/19/2022 07:56 AM   Modules accepted: Orders

## 2022-09-23 ENCOUNTER — Other Ambulatory Visit: Payer: Self-pay | Admitting: Family

## 2022-09-24 NOTE — Therapy (Deleted)
OUTPATIENT PHYSICAL THERAPY FEMALE PELVIC EVALUATION   Patient Name: [name] MRN: [id number] DOB:1966/03/18, 56 y.o., female Today's Date: 09/24/2022    Past Medical History:  Diagnosis Date   Acute diverticulitis    Acute pain of right knee 10/14/2016   Acute renal failure (ARF) ([place]) 01/26/2019   Anemia    Arthritis    Chest pain    a. 12/2021 Cor CTA: LM nl, LAD <25%, LCX nl, RCA nl. Cor Ca2+ = 1.29.   Chronic back pain    Complicated by neuropathy. ->  On Neurontin and duloxetine along with Flexeril and Voltaren gel.  Also uses PRN tramadol.   COPD with asthma ([place])    On combination of albuterol Qvar and Symbicort   Depression    Diabetes mellitus without complication ([place])    Diastolic dysfunction    a. 09/2015 Echo: EF 55-60%, GrI DD; b. 12/2021 Echo: EF 60-65%, no rwma, GrI DD, nl RV fxn. RVSP 28.79mHg. Mild MR.   Diverticulitis large intestine 05/12/2020   Dyspnea    GERD (gastroesophageal reflux disease)    Heart murmur    Hypertension    Palpitations    a. 12/2021 Zio: Predominantly sinus rhythm at 80 (38-136).  Frequent PACs (5.3%).  5 short runs of atrial tachycardia (longest 33 beats at 119 bpm.  Fastest 160 bpm).  Triggered events associated PACs and short runs of PAT.   Urinary incontinence    Pessary present   Past Surgical History:  Procedure Laterality Date   7-Day Zio Patch Monitor  12/2021   Predominant sinus rhythm.  Rate range 38-136 bpm with an average of 80 bpm (bradycardia only noted during hours of sleep.  5 Atrial Runs-fastest was 5 beats at a max rate 162 bpm, longest was 17.2 seconds/33 beats at a rate of 119 bpm--not noted on patient trigger.  Frequent PACs noted (5.3%).  Rare PVCs.   ABDOMINAL HYSTERECTOMY     COLONOSCOPY WITH PROPOFOL N/A 10/02/2021   Procedure: COLONOSCOPY WITH PROPOFOL;  Surgeon: VLin Landsman MD;  [facility];  Service: Gastroenterology;  Laterality: N/A;   Coronary CT angiogram  01/03/2022    Thoracic aortic atherosclerosis.  Coronary Calcium Score 1.29.  Minimal proximal LAD calcification. (<25%).   ESOPHAGOGASTRODUODENOSCOPY N/A 10/02/2021   Procedure: ESOPHAGOGASTRODUODENOSCOPY (EGD);  Surgeon: VLin Landsman MD;  [facility];  Service: Gastroenterology;  Laterality: N/A;   TRANSTHORACIC ECHOCARDIOGRAM  09/29/2015   EF 55-60%.  No R WMA.  GR 1 DD.  Is also normal valves.  Normal study.   TRANSTHORACIC ECHOCARDIOGRAM  01/09/2022   EF 60 to 65%.  Mild to moderate LVH.  G1 DD.  Normal RV size and function.  Normal atrial sizes.  Normal valves.  NORMAL ECHO   TUBAL LIGATION     Patient Active Problem List   Diagnosis Date Noted   Lumbar strain 08/13/2022   Lipoma of left upper extremity    Exercise intolerance 12/26/2021   Heart murmur 12/26/2021   DOE (dyspnea on exertion) 12/26/2021   Palpitations 12/26/2021   Abdominal bloating    Polyp of colon    Multiple adenomatous polyps    Depression    Hyperlipidemia associated with type 2 diabetes mellitus (HCC)    GERD (gastroesophageal reflux disease)    Influenza A 01/26/2019   Marijuana use 06/12/2017   [place] 01/[place]   Plantar fasciitis of left foot 11/26/2015   Back pain 10/10/2015   Asthma 09/29/2015   Essential hypertension  09/29/2015   DM (diabetes mellitus) type II controlled, 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  Rationale for Evaluation and Treatment Rehabilitation  ONSET DATE: 6 months  SUBJECTIVE:                                                                                                                                                                                            SUBJECTIVE STATEMENT: may be exacerbated by low back pain, can address with pelvic PT Fluid intake: 3 cans pepsi zero, 2 bottles water, occasional tea     PAIN:  Are you having pain? {yes/no:20286} NPRS scale: ***/10 Pain location: {pelvic pain location:27098}  Pain type: {type:313116} Pain description: {PAIN DESCRIPTION:21022940}   Aggravating factors: *** Relieving factors: ***  PRECAUTIONS: None  WEIGHT BEARING RESTRICTIONS {Yes ***/No:24003}  FALLS:  Has patient fallen in last 6 months? {fallsyesno:27318}  LIVING ENVIRONMENT: Lives with: {[facility] lives with:25569::"lives with their family"} Lives in: {Lives in:25570} Stairs: {opstairs:27293} Has following equipment at home: {Assistive devices:23999}  OCCUPATION: ***  PLOF: {PLOF:24004}  PATIENT GOALS ***  PERTINENT HISTORY:  Acute Renal Failure; COPD; Diabetes; Abdominal hysterectomy Sexual abuse: {Yes/No:304960894}  BOWEL MOVEMENT Pain with bowel movement: {yes/no:20286} Type of bowel movement:Type (Bristol Stool Scale) ***, Frequency daily, and Strain Yes Fully empty rectum: Yes: *** Leakage: {Yes/No:304960894} Pads: {Yes/No:304960894} Fiber supplement: Yes: Miralax  URINATION Pain with urination: {yes/no:20286} Fully empty bladder: No, When urinating, she feels difficulty starting urine stream, dribbling after finishing, the need to urinate multiple times in a row, and to push on her belly or vagina to empty bladder Stream: {PT urination:27102} Urgency: {Yes/No:304960894} Frequency: Day time voids 7-8.  Nocturia: 5-6 times per night to void. Leakage: Urge to void, Walking to the bathroom, Coughing, Sneezing, Laughing, Exercise, Lifting, and going from sitting to standing, with a full bladder Pads: Yes: several liners per day  INTERCOURSE Pain with intercourse: {pain with intercourse PA:27099} Ability to have vaginal penetration:  {Yes/No:304960894} Climax: *** Marinoff Scale:  ***/3  PREGNANCY Vaginal deliveries 3 Tearing {Yes***/No:304960894} C-section deliveries *** Currently pregnant {Yes***/No:304960894}  PROLAPSE {PT prolapse:27101}Stage II anterior, Stage II posterior, Stage I apical prolapse    OBJECTIVE:   DIAGNOSTIC FINDINGS:  Pelvic floor strength III/V, Pelvic floor musculature: Right levator non-tender, Right obturator tender, Left levator non-tender, Left obturator tender; PVR of 56 ml was obtained by bladder scan.  PATIENT SURVEYS:  {  rehab surveys:24030}  PFIQ-7 ***  COGNITION:  Overall cognitive status: Within functional limits for tasks assessed     SENSATION:  Light touch: {intact/deficits:24005}  Proprioception: {intact/deficits:24005}  MUSCLE LENGTH: Hamstrings: Right *** deg; Left *** deg Marcello Moores test: Right *** deg; Left *** deg  LUMBAR SPECIAL TESTS:  {lumbar special test:25242}  FUNCTIONAL TESTS:  {Functional tests:24029}  GAIT: Distance walked: *** Assistive device utilized: {Assistive devices:23999} Level of assistance: {Levels of assistance:24026} Comments: ***               POSTURE: {posture:25561}   PELVIC ALIGNMENT:  LUMBARAROM/PROM  A/PROM A/PROM  eval  Flexion   Extension   Right lateral flexion   Left lateral flexion   Right rotation   Left rotation    (Blank rows = not tested)  LOWER EXTREMITY ROM:  {AROM/PROM:27142} ROM Right eval Left eval  Hip flexion    Hip extension    Hip abduction    Hip adduction    Hip internal rotation    Hip external rotation    Knee flexion    Knee extension    Ankle dorsiflexion    Ankle plantarflexion    Ankle inversion    Ankle eversion     (Blank rows = not tested)  LOWER EXTREMITY MMT:  MMT Right eval Left eval  Hip flexion    Hip extension    Hip abduction    Hip adduction    Hip internal rotation    Hip external rotation    Knee flexion    Knee extension    Ankle dorsiflexion    Ankle plantarflexion    Ankle inversion     Ankle eversion      PALPATION:   General  ***                External Perineal Exam ***                             Internal Pelvic Floor ***  Patient confirms identification and approves PT to assess internal pelvic floor and treatment {yes/no:20286}  PELVIC MMT:   MMT eval  Vaginal   Internal Anal Sphincter   External Anal Sphincter   Puborectalis   Diastasis Recti   (Blank rows = not tested)        TONE: ***  PROLAPSE: ***  TODAY'S TREATMENT  EVAL ***   PATIENT EDUCATION:  Education details: *** Person educated: {Person educated:25204} Education method: {Education Method:25205} Education comprehension: {Education Comprehension:25206}   HOME EXERCISE PROGRAM: ***  ASSESSMENT:  CLINICAL IMPRESSION: Patient is a *** y.o. *** who was seen today for physical therapy evaluation and treatment for ***.    OBJECTIVE IMPAIRMENTS {opptimpairments:25111}.   ACTIVITY LIMITATIONS {activitylimitations:27494}  PARTICIPATION LIMITATIONS: {participationrestrictions:25113}  PERSONAL FACTORS {Personal factors:25162} are also affecting patient's functional outcome.   REHAB POTENTIAL: {rehabpotential:25112}  CLINICAL DECISION MAKING: {clinical decision making:25114}  EVALUATION COMPLEXITY: {Evaluation complexity:25115}   GOALS: Goals reviewed with patient? {yes/no:20286}  SHORT TERM GOALS: Target date: {follow up:25551}  *** Baseline: Goal status: {GOALSTATUS:25110}  2.  *** Baseline:  Goal status: {GOALSTATUS:25110}  3.  *** Baseline:  Goal status: {GOALSTATUS:25110}  4.  *** Baseline:  Goal status: {GOALSTATUS:25110}  5.  *** Baseline:  Goal status: {GOALSTATUS:25110}  6.  *** Baseline:  Goal status: {GOALSTATUS:25110}  LONG TERM GOALS: Target date: {follow up:25551}   *** Baseline:  Goal status: {GOALSTATUS:25110}  2.  *** Baseline:  Goal status: {GOALSTATUS:25110}  3.  *** Baseline:  Goal status: {GOALSTATUS:25110}  4.   *** Baseline:  Goal status: {GOALSTATUS:25110}  5.  *** Baseline:  Goal status: {GOALSTATUS:25110}  6.  *** Baseline:  Goal status: {GOALSTATUS:25110}  PLAN: PT FREQUENCY: {rehab frequency:25116}  PT DURATION: {rehab duration:25117}  PLANNED INTERVENTIONS: {rehab planned interventions:25118::"Therapeutic exercises","Therapeutic activity","Neuromuscular re-education","Balance training","Gait training","Patient/Family education","Self Care","Joint mobilization"}  PLAN FOR NEXT SESSION: ***   Monika Chestang, PT 09/24/2022, 9:13 AM

## 2022-09-25 ENCOUNTER — Emergency Department (HOSPITAL_COMMUNITY): Payer: Medicare Other

## 2022-09-25 ENCOUNTER — Encounter: Payer: Medicare Other | Attending: Obstetrics and Gynecology | Admitting: Physical Therapy

## 2022-09-25 ENCOUNTER — Inpatient Hospital Stay (HOSPITAL_COMMUNITY)
Admission: EM | Admit: 2022-09-25 | Discharge: 2022-09-30 | DRG: 637 | Disposition: A | Discharge status: Skilled Nursing Facility | Payer: Medicare Other | Attending: Internal Medicine | Admitting: Internal Medicine

## 2022-09-25 DIAGNOSIS — E1140 Type 2 diabetes mellitus with diabetic neuropathy, unspecified: Secondary | ICD-10-CM | POA: Diagnosis present

## 2022-09-25 DIAGNOSIS — E1169 Type 2 diabetes mellitus with other specified complication: Secondary | ICD-10-CM | POA: Diagnosis present

## 2022-09-25 DIAGNOSIS — F32A Depression, unspecified: Secondary | ICD-10-CM | POA: Diagnosis present

## 2022-09-25 DIAGNOSIS — R4182 Altered mental status, unspecified: Secondary | ICD-10-CM | POA: Diagnosis present

## 2022-09-25 DIAGNOSIS — Z90710 Acquired absence of both cervix and uterus: Secondary | ICD-10-CM

## 2022-09-25 DIAGNOSIS — J4489 Other specified chronic obstructive pulmonary disease: Secondary | ICD-10-CM | POA: Diagnosis present

## 2022-09-25 DIAGNOSIS — Z7984 Long term (current) use of oral hypoglycemic drugs: Secondary | ICD-10-CM

## 2022-09-25 DIAGNOSIS — M62838 Other muscle spasm: Secondary | ICD-10-CM | POA: Insufficient documentation

## 2022-09-25 DIAGNOSIS — R002 Palpitations: Secondary | ICD-10-CM

## 2022-09-25 DIAGNOSIS — R1030 Lower abdominal pain, unspecified: Secondary | ICD-10-CM | POA: Insufficient documentation

## 2022-09-25 DIAGNOSIS — G8929 Other chronic pain: Secondary | ICD-10-CM | POA: Diagnosis present

## 2022-09-25 DIAGNOSIS — R531 Weakness: Secondary | ICD-10-CM

## 2022-09-25 DIAGNOSIS — K219 Gastro-esophageal reflux disease without esophagitis: Secondary | ICD-10-CM | POA: Diagnosis present

## 2022-09-25 DIAGNOSIS — C73 Malignant neoplasm of thyroid gland: Secondary | ICD-10-CM

## 2022-09-25 DIAGNOSIS — Z8249 Family history of ischemic heart disease and other diseases of the circulatory system: Secondary | ICD-10-CM

## 2022-09-25 DIAGNOSIS — I10 Essential (primary) hypertension: Secondary | ICD-10-CM | POA: Diagnosis not present

## 2022-09-25 DIAGNOSIS — H532 Diplopia: Secondary | ICD-10-CM | POA: Diagnosis present

## 2022-09-25 DIAGNOSIS — R42 Dizziness and giddiness: Secondary | ICD-10-CM

## 2022-09-25 DIAGNOSIS — Z79899 Other long term (current) drug therapy: Secondary | ICD-10-CM

## 2022-09-25 DIAGNOSIS — I161 Hypertensive emergency: Secondary | ICD-10-CM | POA: Diagnosis present

## 2022-09-25 DIAGNOSIS — E118 Type 2 diabetes mellitus with unspecified complications: Secondary | ICD-10-CM

## 2022-09-25 DIAGNOSIS — N393 Stress incontinence (female) (male): Secondary | ICD-10-CM | POA: Insufficient documentation

## 2022-09-25 DIAGNOSIS — N3281 Overactive bladder: Secondary | ICD-10-CM | POA: Insufficient documentation

## 2022-09-25 DIAGNOSIS — R410 Disorientation, unspecified: Principal | ICD-10-CM

## 2022-09-25 DIAGNOSIS — E860 Dehydration: Secondary | ICD-10-CM | POA: Diagnosis present

## 2022-09-25 DIAGNOSIS — Z87891 Personal history of nicotine dependence: Secondary | ICD-10-CM

## 2022-09-25 DIAGNOSIS — R000 Tachycardia, unspecified: Secondary | ICD-10-CM | POA: Diagnosis not present

## 2022-09-25 DIAGNOSIS — E876 Hypokalemia: Secondary | ICD-10-CM

## 2022-09-25 DIAGNOSIS — F1290 Cannabis use, unspecified, uncomplicated: Secondary | ICD-10-CM | POA: Diagnosis present

## 2022-09-25 DIAGNOSIS — Z7951 Long term (current) use of inhaled steroids: Secondary | ICD-10-CM

## 2022-09-25 DIAGNOSIS — G9341 Metabolic encephalopathy: Secondary | ICD-10-CM | POA: Diagnosis present

## 2022-09-25 DIAGNOSIS — E785 Hyperlipidemia, unspecified: Secondary | ICD-10-CM | POA: Diagnosis present

## 2022-09-25 DIAGNOSIS — M549 Dorsalgia, unspecified: Secondary | ICD-10-CM | POA: Diagnosis present

## 2022-09-25 DIAGNOSIS — E1165 Type 2 diabetes mellitus with hyperglycemia: Principal | ICD-10-CM | POA: Diagnosis present

## 2022-09-25 DIAGNOSIS — G43909 Migraine, unspecified, not intractable, without status migrainosus: Secondary | ICD-10-CM | POA: Diagnosis present

## 2022-09-25 DIAGNOSIS — R339 Retention of urine, unspecified: Secondary | ICD-10-CM | POA: Diagnosis present

## 2022-09-25 LAB — DIFFERENTIAL
Abs Immature Granulocytes: 0.03 10*3/uL (ref 0.00–0.07)
Basophils Absolute: 0 10*3/uL (ref 0.0–0.1)
Basophils Relative: 0 %
Eosinophils Absolute: 0 10*3/uL (ref 0.0–0.5)
Eosinophils Relative: 1 %
Immature Granulocytes: 1 %
Lymphocytes Relative: 27 %
Lymphs Abs: 1.5 10*3/uL (ref 0.7–4.0)
Monocytes Absolute: 0.5 10*3/uL (ref 0.1–1.0)
Monocytes Relative: 9 %
Neutro Abs: 3.3 10*3/uL (ref 1.7–7.7)
Neutrophils Relative %: 62 %

## 2022-09-25 LAB — PROTIME-INR
INR: 0.9 (ref 0.8–1.2)
Prothrombin Time: 12.2 seconds (ref 11.4–15.2)

## 2022-09-25 LAB — I-STAT VENOUS BLOOD GAS, ED
Acid-Base Excess: 4 mmol/L — ABNORMAL HIGH (ref 0.0–2.0)
Bicarbonate: 26.8 mmol/L (ref 20.0–28.0)
Calcium, Ion: 1.22 mmol/L (ref 1.15–1.40)
HCT: 37 % (ref 36.0–46.0)
Hemoglobin: 12.6 g/dL (ref 12.0–15.0)
O2 Saturation: 96 %
Potassium: 2.7 mmol/L — CL (ref 3.5–5.1)
Sodium: 138 mmol/L (ref 135–145)
TCO2: 28 mmol/L (ref 22–32)
pCO2, Ven: 32.3 mmHg — ABNORMAL LOW (ref 44–60)
pH, Ven: 7.527 — ABNORMAL HIGH (ref 7.25–7.43)
pO2, Ven: 69 mmHg — ABNORMAL HIGH (ref 32–45)

## 2022-09-25 LAB — CBG MONITORING, ED: Glucose-Capillary: 420 mg/dL — ABNORMAL HIGH (ref 70–99)

## 2022-09-25 LAB — URINALYSIS, ROUTINE W REFLEX MICROSCOPIC
Bacteria, UA: NONE SEEN
Bilirubin Urine: NEGATIVE
Glucose, UA: >=500 mg/dL — AB
Hgb urine dipstick: NEGATIVE
Ketones, ur: NEGATIVE mg/dL
Leukocytes,Ua: NEGATIVE
Nitrite: NEGATIVE
Protein, ur: 30 mg/dL — AB
Specific Gravity, Urine: >1.046 — ABNORMAL HIGH (ref 1.005–1.030)
pH: 6 (ref 5.0–8.0)

## 2022-09-25 LAB — CBC
HCT: 36.8 % (ref 36.0–46.0)
Hemoglobin: 12.1 g/dL (ref 12.0–15.0)
MCH: 30.6 pg (ref 26.0–34.0)
MCHC: 32.9 g/dL (ref 30.0–36.0)
MCV: 93.2 fL (ref 80.0–100.0)
Platelets: 208 10*3/uL (ref 150–400)
RBC: 3.95 MIL/uL (ref 3.87–5.11)
RDW: 13.9 % (ref 11.5–15.5)
WBC: 5.3 10*3/uL (ref 4.0–10.5)
nRBC: 0 % (ref 0.0–0.2)

## 2022-09-25 LAB — COMPREHENSIVE METABOLIC PANEL
ALT: 20 U/L (ref 0–44)
AST: 25 U/L (ref 15–41)
Albumin: 3.3 g/dL — ABNORMAL LOW (ref 3.5–5.0)
Alkaline Phosphatase: 112 U/L (ref 38–126)
Anion gap: 10 (ref 5–15)
BUN: 12 mg/dL (ref 6–20)
CO2: 26 mmol/L (ref 22–32)
Calcium: 10.4 mg/dL — ABNORMAL HIGH (ref 8.9–10.3)
Chloride: 103 mmol/L (ref 98–111)
Creatinine, Ser: 0.99 mg/dL (ref 0.44–1.00)
GFR, Estimated: >60 mL/min (ref >60)
Glucose, Bld: 457 mg/dL — ABNORMAL HIGH (ref 70–99)
Potassium: 2.7 mmol/L — CL (ref 3.5–5.1)
Sodium: 139 mmol/L (ref 135–145)
Total Bilirubin: 0.3 mg/dL (ref 0.3–1.2)
Total Protein: 6.3 g/dL — ABNORMAL LOW (ref 6.5–8.1)

## 2022-09-25 LAB — T4, FREE: Free T4: 0.87 ng/dL (ref 0.61–1.12)

## 2022-09-25 LAB — I-STAT CHEM 8, ED
BUN: 11 mg/dL (ref 6–20)
Calcium, Ion: 1.25 mmol/L (ref 1.15–1.40)
Chloride: 101 mmol/L (ref 98–111)
Creatinine, Ser: 0.8 mg/dL (ref 0.44–1.00)
Glucose, Bld: 467 mg/dL — ABNORMAL HIGH (ref 70–99)
HCT: 37 % (ref 36.0–46.0)
Hemoglobin: 12.6 g/dL (ref 12.0–15.0)
Potassium: 2.7 mmol/L — CL (ref 3.5–5.1)
Sodium: 140 mmol/L (ref 135–145)
TCO2: 25 mmol/L (ref 22–32)

## 2022-09-25 LAB — RAPID URINE DRUG SCREEN, HOSP PERFORMED
Amphetamines: NOT DETECTED
Barbiturates: NOT DETECTED
Benzodiazepines: NOT DETECTED
Cocaine: NOT DETECTED
Opiates: NOT DETECTED
Tetrahydrocannabinol: POSITIVE — AB

## 2022-09-25 LAB — APTT: aPTT: 26 seconds (ref 24–36)

## 2022-09-25 LAB — I-STAT BETA HCG BLOOD, ED (MC, WL, AP ONLY): I-stat hCG, quantitative: <5 m[IU]/mL (ref <5)

## 2022-09-25 LAB — MAGNESIUM: Magnesium: 1.6 mg/dL — ABNORMAL LOW (ref 1.7–2.4)

## 2022-09-25 LAB — ETHANOL: Alcohol, Ethyl (B): <10 mg/dL (ref <10)

## 2022-09-25 LAB — TSH: TSH: 0.408 u[IU]/mL (ref 0.350–4.500)

## 2022-09-25 LAB — ACETAMINOPHEN LEVEL: Acetaminophen (Tylenol), Serum: <10 ug/mL — ABNORMAL LOW (ref 10–30)

## 2022-09-25 LAB — SALICYLATE LEVEL: Salicylate Lvl: <7 mg/dL — ABNORMAL LOW (ref 7.0–30.0)

## 2022-09-25 MED ORDER — NALOXONE HCL 0.4 MG/ML IJ SOLN
INTRAMUSCULAR | Status: AC
  Filled 2022-09-25: qty 1

## 2022-09-25 MED ORDER — ENOXAPARIN SODIUM 40 MG/0.4ML IJ SOSY
40.0000 mg | PREFILLED_SYRINGE | INTRAMUSCULAR | Status: DC
  Administered 2022-09-26 – 2022-09-29 (×5): 40 mg via SUBCUTANEOUS
  Filled 2022-09-25 (×5): qty 0.4

## 2022-09-25 MED ORDER — LOSARTAN POTASSIUM 50 MG PO TABS
100.0000 mg | ORAL_TABLET | Freq: Every day | ORAL | Status: DC
  Administered 2022-09-26 – 2022-09-30 (×6): 100 mg via ORAL
  Filled 2022-09-25 (×6): qty 2

## 2022-09-25 MED ORDER — INSULIN ASPART 100 UNIT/ML IJ SOLN
0.0000 [IU] | Freq: Three times a day (TID) | INTRAMUSCULAR | Status: DC
  Administered 2022-09-26: 8 [IU] via SUBCUTANEOUS
  Administered 2022-09-27: 2 [IU] via SUBCUTANEOUS
  Administered 2022-09-27: 8 [IU] via SUBCUTANEOUS
  Administered 2022-09-27: 5 [IU] via SUBCUTANEOUS
  Administered 2022-09-28 (×2): 3 [IU] via SUBCUTANEOUS
  Administered 2022-09-28: 8 [IU] via SUBCUTANEOUS
  Administered 2022-09-29: 3 [IU] via SUBCUTANEOUS
  Administered 2022-09-29: 8 [IU] via SUBCUTANEOUS
  Administered 2022-09-29: 11 [IU] via SUBCUTANEOUS
  Administered 2022-09-30: 3 [IU] via SUBCUTANEOUS
  Administered 2022-09-30: 15 [IU] via SUBCUTANEOUS

## 2022-09-25 MED ORDER — LACTATED RINGERS IV BOLUS
1000.0000 mL | Freq: Once | INTRAVENOUS | Status: AC
  Administered 2022-09-25: 1000 mL via INTRAVENOUS

## 2022-09-25 MED ORDER — INSULIN ASPART 100 UNIT/ML IJ SOLN: 5.0000 [IU] | Freq: Once | INTRAMUSCULAR | Status: DC

## 2022-09-25 MED ORDER — IOHEXOL 350 MG/ML SOLN
100.0000 mL | Freq: Once | INTRAVENOUS | Status: AC | PRN
  Administered 2022-09-25: 100 mL via INTRAVENOUS

## 2022-09-25 MED ORDER — INSULIN ASPART 100 UNIT/ML IJ SOLN
8.0000 [IU] | Freq: Once | INTRAMUSCULAR | Status: AC
  Administered 2022-09-25: 8 [IU] via SUBCUTANEOUS

## 2022-09-25 MED ORDER — MOMETASONE FURO-FORMOTEROL FUM 200-5 MCG/ACT IN AERO
2.0000 | INHALATION_SPRAY | Freq: Two times a day (BID) | RESPIRATORY_TRACT | Status: DC
  Administered 2022-09-26 – 2022-09-27 (×2): 2 via RESPIRATORY_TRACT
  Filled 2022-09-25: qty 8.8

## 2022-09-25 MED ORDER — POTASSIUM CHLORIDE 10 MEQ/100ML IV SOLN
10.0000 meq | INTRAVENOUS | Status: AC
  Administered 2022-09-25 (×3): 10 meq via INTRAVENOUS
  Filled 2022-09-25 (×3): qty 100

## 2022-09-25 MED ORDER — NALOXONE HCL 0.4 MG/ML IJ SOLN
0.4000 mg | Freq: Once | INTRAMUSCULAR | Status: AC
  Administered 2022-09-25: 0.4 mg via INTRAVENOUS

## 2022-09-25 MED ORDER — IOHEXOL 350 MG/ML SOLN
60.0000 mL | Freq: Once | INTRAVENOUS | Status: AC | PRN
  Administered 2022-09-25: 60 mL via INTRAVENOUS

## 2022-09-25 MED ORDER — INSULIN ASPART 100 UNIT/ML IJ SOLN
0.0000 [IU] | Freq: Every day | INTRAMUSCULAR | Status: DC
  Administered 2022-09-29: 5 [IU] via SUBCUTANEOUS

## 2022-09-25 MED ORDER — ATORVASTATIN CALCIUM 10 MG PO TABS
10.0000 mg | ORAL_TABLET | Freq: Every day | ORAL | Status: DC
  Administered 2022-09-26 – 2022-09-30 (×5): 10 mg via ORAL
  Filled 2022-09-25 (×5): qty 1

## 2022-09-25 MED ORDER — GABAPENTIN 600 MG PO TABS
300.0000 mg | ORAL_TABLET | Freq: Two times a day (BID) | ORAL | Status: DC
  Administered 2022-09-26 – 2022-09-30 (×10): 300 mg via ORAL
  Filled 2022-09-25 (×4): qty 1
  Filled 2022-09-25: qty 0.5
  Filled 2022-09-25 (×4): qty 1
  Filled 2022-09-25: qty 0.5
  Filled 2022-09-25 (×2): qty 1

## 2022-09-25 MED ORDER — MAGNESIUM SULFATE 2 GM/50ML IV SOLN
2.0000 g | Freq: Once | INTRAVENOUS | Status: AC
  Administered 2022-09-25: 2 g via INTRAVENOUS
  Filled 2022-09-25: qty 50

## 2022-09-25 MED ORDER — POTASSIUM CHLORIDE 2 MEQ/ML IV SOLN
INTRAVENOUS | Status: DC
  Filled 2022-09-25: qty 1000

## 2022-09-25 NOTE — ED Notes (Signed)
Patient transported to CT 

## 2022-09-25 NOTE — ED Triage Notes (Signed)
Pt BIB daughter. Pt had to be pulled out of the car by ED staff. Pt LSN last night 1130pm, pt lethargic with left sided neglect. Code stroke protocol initiated.

## 2022-09-25 NOTE — ED Provider Notes (Signed)
[facility] EMERGENCY DEPARTMENT Provider Note   CSN: [id number] Arrival date & time: 09/25/22  1524     History  Chief Complaint  Patient presents with   Code Stroke    [name] is a 56 y.o. female.  HPI  Per EMS report, patient was last known normal was approximately 11:30 PM.  Patient reportedly woke up at 5:30 AM was unable to move.  Patient was brought to the ED by family due to concern for altered mental status.  No other history was able to be provided by EMS.      Home Medications Prior to Admission medications   Medication Sig Start Date End Date Taking? Authorizing Provider  albuterol (PROVENTIL) (2.5 MG/3ML) 0.083% nebulizer solution Take 3 mLs (2.5 mg total) by nebulization every 2 (two) hours as needed for wheezing or shortness of breath. 01/28/19  Yes Lule, Joana, PA  albuterol (VENTOLIN HFA) 108 (90 Base) MCG/ACT inhaler Inhale 2 puffs into the lungs every 6 (six) hours as needed for wheezing or shortness of breath. 01/01/22  Yes Tyler Pita, MD  atorvastatin (LIPITOR) 10 MG tablet Take 10 mg by mouth daily.   Yes [provider]  budesonide-formoterol (SYMBICORT) 160-4.5 MCG/ACT inhaler Inhale 2 puffs into the lungs 2 (two) times daily. 01/01/22  Yes Tyler Pita, MD  Cholecalciferol (VITAMIN D3) 1.25 MG (50000 UT) CAPS Take 1 capsule by mouth once a week. 02/09/20  Yes [provider]  diclofenac sodium (VOLTAREN) 1 % GEL Apply 2 g topically 4 (four) times daily as needed (pain). 01/28/19  Yes Lule, Joana, PA  DULoxetine (CYMBALTA) 20 MG capsule Take 20 mg by mouth daily. 07/11/21 09/25/22 Yes [provider]  fluticasone-salmeterol (ADVAIR DISKUS) 250-50 MCG/ACT AEPB Inhale 1 puff into the lungs in the morning and at bedtime. 01/06/22  Yes Tyler Pita, MD  gabapentin (NEURONTIN) 600 MG tablet Take by mouth 2 (two) times daily. 09/17/20  Yes [provider]  glipiZIDE (GLUCOTROL) 5 MG tablet  Take 1 tablet (5 mg total) by mouth every morning. 01/28/19  Yes Lule, Joana, PA  ibuprofen (ADVIL) 600 MG tablet Take 1 tablet (600 mg total) by mouth every 6 (six) hours as needed. Patient taking differently: Take 800 mg by mouth every 6 (six) hours as needed for mild pain or moderate pain. 08/20/22  Yes Fondaw, Wylder S, PA  loratadine (CLARITIN) 10 MG tablet Take 1 tablet (10 mg total) by mouth daily. 01/28/19  Yes Lule, Joana, PA  losartan (COZAAR) 100 MG tablet Take 1 tablet (100 mg total) by mouth daily. 08/27/22  Yes Leonie Man, MD  metFORMIN (GLUCOPHAGE) 1000 MG tablet Take 1 tablet (1,000 mg total) by mouth 2 (two) times daily with a meal. See discharge instructions for how to take medicine at first 01/28/19  Yes Lule, Joana, PA  methocarbamol (ROBAXIN) 500 MG tablet TAKE 1 TABLET BY MOUTH EVERY 6 HOURS AS NEEDED FOR MUSCLE SPASMS 09/23/22  Yes Newt Minion, MD  metoprolol tartrate (LOPRESSOR) 50 MG tablet Take 100 mg (2 tablets) in the morning and 50 mg (1 tablet) in the evening. You may take 25 mg (1/2) extra daily as needed for palpitations. 02/06/22  Yes Leonie Man, MD  omeprazole (PRILOSEC) 40 MG capsule Take 1 capsule (40 mg total) by mouth daily. 01/01/22  Yes Tyler Pita, MD  oxyCODONE (OXY IR/ROXICODONE) 5 MG immediate release tablet Take 1 tablet (5 mg total) by mouth every 4 (  four) hours as needed for severe pain. 07/08/22  Yes Piscoya, Jose, MD  Semaglutide (RYBELSUS) 7 MG TABS Take by mouth daily.   Yes [provider]  sertraline (ZOLOFT) 50 MG tablet Take 1.5 tablets (75 mg total) by mouth every morning. 01/28/19  Yes Ripley Fraise, PA      Allergies    Patient has no known allergies.    Review of Systems   Review of Systems  Physical Exam Updated Vital Signs BP (!) 170/92   Pulse 64   Temp 98.2 F (36.8 C)   Resp 14   SpO2 100%  Physical Exam Vitals and nursing note reviewed.  Constitutional:      General: She is not in acute distress.     Appearance: She is well-developed.  HENT:     Head: Normocephalic and atraumatic.  Eyes:     Conjunctiva/sclera: Conjunctivae normal.  Cardiovascular:     Rate and Rhythm: Normal rate and regular rhythm.     Heart sounds: No murmur heard. Pulmonary:     Effort: Pulmonary effort is normal. No respiratory distress.     Breath sounds: Normal breath sounds.  Abdominal:     Palpations: Abdomen is soft.     Tenderness: There is no abdominal tenderness.  Musculoskeletal:        General: No swelling.     Cervical back: Neck supple.     Right lower leg: No edema.     Left lower leg: No edema.  Skin:    General: Skin is warm and dry.     Capillary Refill: Capillary refill takes less than 2 seconds.  Neurological:     Mental Status: She is alert. She is disoriented.     GCS: GCS eye subscore is 4. GCS verbal subscore is 2. GCS motor subscore is 4.  Psychiatric:        Mood and Affect: Mood normal.     ED Results / Procedures / Treatments   Labs (all labs ordered are listed, but only abnormal results are displayed) Labs Reviewed  COMPREHENSIVE METABOLIC PANEL - Abnormal; Notable for the following components:      Result Value   Potassium 2.7 (*)    Glucose, Bld 457 (*)    Calcium 10.4 (*)    Total Protein 6.3 (*)    Albumin 3.3 (*)    All other components within normal limits  RAPID URINE DRUG SCREEN, HOSP PERFORMED - Abnormal; Notable for the following components:   Tetrahydrocannabinol POSITIVE (*)    All other components within normal limits  URINALYSIS, ROUTINE W REFLEX MICROSCOPIC - Abnormal; Notable for the following components:   Specific Gravity, Urine >1.046 (*)    Glucose, UA >=500 (*)    Protein, ur 30 (*)    All other components within normal limits  MAGNESIUM - Abnormal; Notable for the following components:   Magnesium 1.6 (*)    All other components within normal limits  ACETAMINOPHEN LEVEL - Abnormal; Notable for the following components:   Acetaminophen  (Tylenol), Serum <10 (*)    All other components within normal limits  SALICYLATE LEVEL - Abnormal; Notable for the following components:   Salicylate Lvl <6.9 (*)    All other components within normal limits  CBG MONITORING, ED - Abnormal; Notable for the following components:   Glucose-Capillary 420 (*)    All other components within normal limits  I-STAT CHEM 8, ED - Abnormal; Notable for the following components:   Potassium 2.7 (*)  Glucose, Bld 467 (*)    All other components within normal limits  I-STAT VENOUS BLOOD GAS, ED - Abnormal; Notable for the following components:   pH, Ven 7.527 (*)    pCO2, Ven 32.3 (*)    pO2, Ven 69 (*)    Acid-Base Excess 4.0 (*)    Potassium 2.7 (*)    All other components within normal limits  URINE CULTURE  ETHANOL  PROTIME-INR  APTT  CBC  DIFFERENTIAL  T4, FREE  TSH  AMMONIA  I-STAT BETA HCG BLOOD, ED ([facility], [facility], [facility] ONLY)  I-STAT VENOUS BLOOD GAS, ED    EKG None  Radiology CT ABDOMEN PELVIS W CONTRAST  Result Date: 09/25/2022 CLINICAL DATA:  Abdominal pain, lethargy, left-sided neglect EXAM: CT ABDOMEN AND PELVIS WITH CONTRAST TECHNIQUE: Multidetector CT imaging of the abdomen and pelvis was performed using the standard protocol following bolus administration of intravenous contrast. RADIATION DOSE REDUCTION: This exam was performed according to the departmental dose-optimization program which includes automated exposure control, adjustment of the mA and/or kV according to patient size and/or use of iterative reconstruction technique. CONTRAST:  1m OMNIPAQUE IOHEXOL 350 MG/ML SOLN COMPARISON:  06/21/2022 FINDINGS: Lower chest: Scattered hypoventilatory changes at the lung bases. Stable nodular consolidation in the lingula compatible with atelectasis or scarring, unchanged since 2021. Hepatobiliary: Scattered subcentimeter liver hypodensities are unchanged, compatible with multiple cysts. No biliary duct dilation. Gallbladder is  unremarkable. Pancreas: There is a 7 mm hypodensity within the tail the pancreas reference image 28/3. The remainder of the pancreas is unremarkable. Spleen: Normal in size without focal abnormality. Adrenals/Urinary Tract: Stable indeterminate 2.9 cm left adrenal mass, previously characterized as adenoma. Right adrenal is unremarkable. The kidneys are stable, with symmetrical enhancement. Excreted contrast within the bladder consistent with previous CT and MRI procedures performed earlier today. No filling defects. Stomach/Bowel: No bowel obstruction or ileus. Scattered colonic diverticulosis without diverticulitis. Normal appendix right lower quadrant. No bowel wall thickening or inflammatory change. Vascular/Lymphatic: No significant vascular findings are present. No enlarged abdominal or pelvic lymph nodes. Reproductive: Status post hysterectomy. No adnexal masses. Pessary in place. Other: No free fluid or free intraperitoneal gas. No abdominal wall hernia. Musculoskeletal: No acute or destructive bony lesions. Reconstructed images demonstrate no additional findings. IMPRESSION: 1. No acute intra-abdominal or intrapelvic process. 2. Distal colonic diverticulosis without diverticulitis. 3. Incidental 7 mm cyst within the pancreatic tail. Recommend follow up pre and post contrast MRI/MRCP or pancreatic protocol CT in 1 year. This recommendation follows ACR consensus guidelines: Management of Incidental Pancreatic Cysts: A White Paper of the ACR Incidental Findings Committee. J Am Coll Radiol 22778;24:235-361 4. Stable left adrenal mass, previously characterized as adenoma. Electronically Signed   By: MRanda NgoM.D.   On: 09/25/2022 21:48   MR BRAIN WO CONTRAST  Result Date: 09/25/2022 CLINICAL DATA:  Neuro deficit acute, stroke suspected. Left-sided neglect noted. Encephalopathy. Extreme hyperglycemia. Extreme hypertension. EXAM: MRI HEAD WITHOUT CONTRAST TECHNIQUE: Multiplanar, multiecho pulse sequences  of the brain and surrounding structures were obtained without intravenous contrast. COMPARISON:  CT head without contrast 09/25/2022. MR head without contrast 04/21/2020 FINDINGS: Brain: Progressive moderate periventricular T2 hyperintensities are present bilaterally. Scattered subcortical T2 hyperintensities have progressed as well. No acute infarct, hemorrhage, or mass lesion is present. The ventricles are of normal size. Mild generalized atrophy is progressed. No significant extraaxial fluid collection is present. White matter changes involving brainstem progressed. Cerebellum is within limits. Vascular: Flow is present in the major intracranial arteries. Skull and upper  cervical spine: The craniocervical junction is normal. Upper cervical spine is within normal limits. Marrow signal is unremarkable. Sinuses/Orbits: The paranasal sinuses and mastoid air cells are clear. The globes and orbits are within normal limits. IMPRESSION: 1. No acute intracranial abnormality. 2. Progressive moderate periventricular and subcortical T2 hyperintensities bilaterally. This most likely reflects the sequelae of microvascular ischemia. Electronically Signed   By: San Morelle M.D.   On: 09/25/2022 20:56   DG CHEST PORT 1 VIEW  Result Date: 09/25/2022 CLINICAL DATA:  Lethargy, weakness, code stroke EXAM: PORTABLE CHEST 1 VIEW COMPARISON:  08/20/2022 FINDINGS: Transverse diameter of heart is increased. There are no signs of pulmonary edema or focal pulmonary consolidation. There is no pleural effusion or pneumothorax. IMPRESSION: Cardiomegaly. There are no signs of pulmonary edema or focal pulmonary consolidation. Electronically Signed   By: Elmer Picker M.D.   On: 09/25/2022 17:04   CT ANGIO HEAD NECK W WO CM W PERF (CODE STROKE)  Result Date: 09/25/2022 CLINICAL DATA:  Headache, sudden, severe Subarachnoid hemorrhage [facility]). Left-sided neglect. EXAM: CT ANGIOGRAPHY HEAD AND NECK CT PERFUSION BRAIN TECHNIQUE:  Multidetector CT imaging of the head and neck was performed using the standard protocol during bolus administration of intravenous contrast. Multiplanar CT image reconstructions and MIPs were obtained to evaluate the vascular anatomy. Carotid stenosis measurements (when applicable) are obtained utilizing NASCET criteria, using the distal internal carotid diameter as the denominator. Multiphase CT imaging of the brain was performed following IV bolus contrast injection. Subsequent parametric perfusion maps were calculated using RAPID software. RADIATION DOSE REDUCTION: This exam was performed according to the departmental dose-optimization program which includes automated exposure control, adjustment of the mA and/or kV according to patient size and/or use of iterative reconstruction technique. CONTRAST:  152m OMNIPAQUE IOHEXOL 350 MG/ML SOLN COMPARISON:  None Available. FINDINGS: CTA NECK FINDINGS Aortic arch: Standard 3 vessel aortic arch with mild atherosclerotic plaque. Widely patent arch vessel origins. Right carotid system: Patent without evidence of stenosis, dissection, or significant atherosclerosis. Tortuous distal cervical ICA. Left carotid system: Patent without evidence of stenosis, dissection, or significant atherosclerosis. Tortuous distal cervical ICA. Vertebral arteries: Patent without evidence of stenosis, dissection, or significant atherosclerosis. Codominant. Skeleton: Minor cervical spondylosis. Other neck: Partially calcified 1.7 cm right thyroid nodule; this has been evaluated on previous imaging (thyroid ultrasound and biopsy). No evidence of cervical lymphadenopathy. Upper chest: Clear lung apices. Review of the MIP images confirms the above findings CTA HEAD FINDINGS Anterior circulation: The internal carotid arteries are widely patent from skull base to carotid termini. ACAs and MCAs are patent without evidence of a proximal branch occlusion or significant proximal stenosis. No aneurysm is  identified. Posterior circulation: The intracranial vertebral arteries are widely patent to the basilar. Patent PICA and SCA origins are seen bilaterally. The basilar artery is widely patent. There are small bilateral posterior communicating arteries, right larger than left. Both PCAs are patent without evidence of a significant proximal stenosis. No aneurysm is identified. Venous sinuses: Patent. Anatomic variants: None. Review of the MIP images confirms the above findings CT Brain Perfusion Findings: ASPECTS: 10 CBF (<30%) Volume: 0 mL Perfusion (Tmax>6.0s) volume: 0 mL Mismatch Volume: 0 mL Infarction Location: No infarct identified by CTP. These results were communicated to Dr. ARory Percyat 3:57 p.m. on 09/25/2022 by text page via the [facility]. IMPRESSION: 1. No large vessel occlusion or significant stenosis in the head or neck. 2. Negative CTP. 3.  Aortic Atherosclerosis (ICD10-I70.0). Electronically Signed   By: AZenia Resides  Jeralyn Ruths M.D.   On: 09/25/2022 16:06   CT HEAD CODE STROKE WO CONTRAST  Result Date: 09/25/2022 CLINICAL DATA:  Code stroke. Neuro deficit, acute, stroke suspected. Left-sided neglect. EXAM: CT HEAD WITHOUT CONTRAST TECHNIQUE: Contiguous axial images were obtained from the base of the skull through the vertex without intravenous contrast. RADIATION DOSE REDUCTION: This exam was performed according to the departmental dose-optimization program which includes automated exposure control, adjustment of the mA and/or kV according to patient size and/or use of iterative reconstruction technique. COMPARISON:  Head CT 08/20/2022 FINDINGS: Brain: There is no evidence of an acute infarct, intracranial hemorrhage, mass, midline shift, or extra-axial fluid collection. The ventricles and sulci are normal. A small cavum septum pellucidum et vergae is noted. 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 Vascular: No hyperdense vessel. Skull: No fracture or suspicious  osseous lesion. Sinuses/Orbits: Visualized paranasal sinuses and mastoid air cells are clear. Unremarkable orbits. Other: None. ASPECTS [facility] Stroke Program Early CT Score) - Ganglionic level infarction (caudate, lentiform nuclei, internal capsule, insula, M1-M3 cortex): 7 - Supraganglionic infarction (M4-M6 cortex): 3 Total score (0-10 with 10 being normal): 10 IMPRESSION: No evidence of acute intracranial abnormality. ASPECTS of 10. These results were communicated to Dr. Rory Percy at 3:47 pm on 09/25/2022 by text page via the [facility] messaging system. Electronically Signed   By: Logan Bores M.D.   On: 09/25/2022 15:47    Procedures Procedures    Medications Ordered in ED Medications  lactated ringers 1,000 mL with potassium chloride 40 mEq infusion (has no administration in time range)  iohexol (OMNIPAQUE) 350 MG/ML injection 100 mL (100 mLs Intravenous Contrast Given 09/25/22 1550)  naloxone (NARCAN) injection 0.4 mg (0.4 mg Intravenous Given 09/25/22 1600)  potassium chloride 10 mEq in 100 mL IVPB (0 mEq Intravenous Stopped 09/25/22 2101)  insulin aspart (novoLOG) injection 8 Units (8 Units Subcutaneous Given 09/25/22 1705)  magnesium sulfate IVPB 2 g 50 mL (2 g Intravenous New Bag/Given 09/25/22 2152)  lactated ringers bolus 1,000 mL (1,000 mLs Intravenous New Bag/Given 09/25/22 2152)  iohexol (OMNIPAQUE) 350 MG/ML injection 60 mL (60 mLs Intravenous Contrast Given 09/25/22 2114)    ED Course/ Medical Decision Making/ A&P                          Medical Decision Making Amount and/or Complexity of Data Reviewed Labs: ordered. Decision-making details documented in ED Course. Radiology: ordered. Decision-making details documented in ED Course. ECG/medicine tests:  Decision-making details documented in ED Course.  Risk Prescription drug management. Decision regarding hospitalization.   Medical Decision Making  This patient is Presenting for Evaluation of altered mental status, patient has a  past medical history HTN, DM which complicates their presentation.  Of which does require a range of treatment options, and is a complaint that involves a high risk of morbidity and mortality.  Arrived in ED by:  POV History obtained from: The patient's daughters  Limitations in history: Patients AMS    At this time I am most concerned for ischemic stroke. Also considering metabolic abnormality, electrolyte abnormalities, hemorrhagic stroke, encephalopathy, intoxication, urinary tract infection, plan for laboratory and imaging study work-up   I interpreted the ECG. It reveals a sinus rhythm. The QTc, PR, and QRS are appropriate. There are no signs of acute ischemia or of significant electrical abnormalities. The ECG does not show a STEMI. There are no ST depressions.  Poor R wave progression. There is  no evidence of a High-Grade Conduction Block..  Similar to prior studies   Laboratory work-up significant for:  -Reviewed the patient's laboratory work-up there is no significant abnormality in her CBC, patient CMP revealed significant hypokalemia with a potassium of 2.7 (repleted) significantly elevated glucose of 457 without anion gap.  No additional significant metabolic abnormalities.  Magnesium 1.6, repleted.  VBG on arrival revealed pH 7.527, elevated relatively low CO2 32.3, abnormality the patient's pH and CO2 level may be secondary to tachypnea.  Salicylate and acetaminophen level unremarkable.  Coagulation panel unmarkable.  UDS positive for THC, urinalysis unremarkable for acute infection   Radiologic work-up was significant for: -Chest x-ray, CT head, CT stroke study, MRI, CT abdomen pelvis unremarkable for acute pathology.  CT abdomen pelvis with evidence of diverticulosis, no evidence of diverticulitis.   Interventions and Interval History: -Patient is laboratory and imaging study work-up relatively unremarkable.  Patient has no infectious symptoms and outside of her minor  electrolyte abnormalities, patient has no significant laboratory imaging study findings.  However the patient has not returned to her neurologic baseline during her time in the emergency department.  She remains confused and per the family is not anywhere close to her neurologic baseline.  We have done extensive laboratory and imaging study work-up in the ED and do not feel that additional testing at this time is clinically indicated.  Have low clinical suspicion for meningitis or encephalitis, do not feel lumbar puncture is necessary at this time.  Patient's UDS is positive for THC however I doubt that this is etiology of her altered mental status.  I feel that the patient requires admission for further evaluation and monitoring.  Patient be admitted to the hospitalist team for further evaluation.    Disposition: Due to the patients current presenting symptoms, physical exam findings, and the workup stated above, it is thought that the etiology of the patients current presentation is AMS   ADMIT: Patient is thought to require admission for further evaluation and monitoring. Patient will be admitted to hospitalist service. Please see in patient provider note for additional treatment plan details.   The plan for this patient was discussed with Dr. Darl Householder, who voiced agreement and who oversaw evaluation and treatment of this patient.     Clinical Complexity  A medically appropriate history, review of systems, and physical exam was performed.   I personally reviewed the lab and imaging studies discussed above.   MDM generated using voice dictation software and may contain dictation errors. Please contact me for any clarification or with any questions.               Final Clinical Impression(s) / ED Diagnoses Final diagnoses:  Delirium    Rx / DC Orders ED Discharge Orders     None         Quanta Roher, [place], MD 09/25/22 2303    Drenda Freeze, MD 09/26/22 1900

## 2022-09-25 NOTE — H&P (Incomplete)
History and Physical  [name] STM:[id number] DOB: 28-Feb-1966 DOA: 09/25/2022  Referring physician: Dr. Oswaldo Milian, EDP  PCP: Crystal Carls, MD  Outpatient Specialists: Medical oncology. Patient coming from: Home.  Chief Complaint: Altered mental status and left-sided weakness.  HPI: [name] is a 56 y.o. female with medical history significant for hypertension, hyperlipidemia, type 2 diabetes, recently diagnosed thyroid cancer followed by hematology oncology at [facility] who presented to [facility] ED due to altered mental status and left-sided weakness.  Last known well was around 11:30 PM.  Upon arrival to the ED, there was concern for possible left sided neglect for which a code stroke was called.  The patient was seen by neurology/stroke team and CVA work up was initiated.    At the time of this visit, the patient's confusion had improved.  Endorses sudden onset dizziness after waking up this morning.  Associated with a severe headache and left arm weakness.  States she had surgery to her left upper extremity about 2 months ago and has had intermittent pain, numbness and tingling affecting the same arm.  States she smokes marijuana to help with the pain.  CT head and MRI brain were negative for acute CVA.  TRH, hospitalist service, was asked to admit for further evaluation.    ED Course: Tmax 98.2.  BP 154/124, pulse 55, respiration rate 23, O2 saturation 99% on room air.  Lab studies were remarkable for serum potassium 2.7, serum glucose 467, magnesium 1.6, calcium 10.4.  CBC with differentials were essentially unremarkable.  Review of Systems: Review of systems as noted in the HPI. All other systems reviewed and are negative.   Past Medical History:  Diagnosis Date  . Acute diverticulitis   . Acute pain of right knee 10/14/2016  . Acute renal failure (ARF) ([place]) 01/26/2019  . Anemia   . Arthritis   . Chest pain    a. 12/2021 Cor CTA: LM nl, LAD <25%, LCX nl, RCA nl. Cor Ca2+ = 1.29.   Marland Kitchen Chronic back pain    Complicated by neuropathy. ->  On Neurontin and duloxetine along with Flexeril and Voltaren gel.  Also uses PRN tramadol.  Marland Kitchen COPD with asthma ([place])    On combination of albuterol Qvar and Symbicort  . Depression   . Diabetes mellitus without complication ([place])   . Diastolic dysfunction    a. 09/2015 Echo: EF 55-60%, GrI DD; b. 12/2021 Echo: EF 60-65%, no rwma, GrI DD, nl RV fxn. RVSP 28.67mHg. Mild MR.  . Diverticulitis large intestine 05/12/2020  . Dyspnea   . GERD (gastroesophageal reflux disease)   . Heart murmur   . Hypertension   . Palpitations    a. 12/2021 Zio: Predominantly sinus rhythm at 80 (38-136).  Frequent PACs (5.3%).  5 short runs of atrial tachycardia (longest 33 beats at 119 bpm.  Fastest 160 bpm).  Triggered events associated PACs and short runs of PAT.  .Marland KitchenUrinary incontinence    Pessary present   Past Surgical History:  Procedure Laterality Date  . 7-Day Zio Patch Monitor  12/2021   Predominant sinus rhythm.  Rate range 38-136 bpm with an average of 80 bpm (bradycardia only noted during hours of sleep.  5 Atrial Runs-fastest was 5 beats at a max rate 162 bpm, longest was 17.2 seconds/33 beats at a rate of 119 bpm--not noted on patient trigger.  Frequent PACs noted (5.3%).  Rare PVCs.  . ABDOMINAL HYSTERECTOMY    . COLONOSCOPY WITH PROPOFOL N/A 10/02/2021  Procedure: COLONOSCOPY WITH PROPOFOL;  Surgeon: Lin Landsman, MD;  Location: [facility] ENDOSCOPY;  Service: Gastroenterology;  Laterality: N/A;  . Coronary CT angiogram  01/03/2022   Thoracic aortic atherosclerosis.  Coronary Calcium Score 1.29.  Minimal proximal LAD calcification. (<25%).  . ESOPHAGOGASTRODUODENOSCOPY N/A 10/02/2021   Procedure: ESOPHAGOGASTRODUODENOSCOPY (EGD);  Surgeon: Lin Landsman, MD;  Location: [facility] ENDOSCOPY;  Service: Gastroenterology;  Laterality: N/A;  . TRANSTHORACIC ECHOCARDIOGRAM  09/29/2015   EF 55-60%.  No R WMA.  GR 1 DD.  Is also normal valves.   Normal study.  . TRANSTHORACIC ECHOCARDIOGRAM  01/09/2022   EF 60 to 65%.  Mild to moderate LVH.  G1 DD.  Normal RV size and function.  Normal atrial sizes.  Normal valves.  NORMAL ECHO  . TUBAL LIGATION      Social History:  reports that she quit smoking about 17 years ago. Her smoking use included cigarettes. She has a 30.00 pack-year smoking history. She has never used smokeless tobacco. She reports that she does not drink alcohol and does not use drugs.   No Known Allergies  Family History  Problem Relation Age of Onset  . Hypertension Mother   . Other Other       Prior to Admission medications   Medication Sig Start Date End Date Taking? Authorizing Provider  albuterol (PROVENTIL) (2.5 MG/3ML) 0.083% nebulizer solution Take 3 mLs (2.5 mg total) by nebulization every 2 (two) hours as needed for wheezing or shortness of breath. 01/28/19  Yes Lule, Joana, PA  albuterol (VENTOLIN HFA) 108 (90 Base) MCG/ACT inhaler Inhale 2 puffs into the lungs every 6 (six) hours as needed for wheezing or shortness of breath. 01/01/22  Yes Tyler Pita, MD  atorvastatin (LIPITOR) 10 MG tablet Take 10 mg by mouth daily.   Yes [provider]  budesonide-formoterol (SYMBICORT) 160-4.5 MCG/ACT inhaler Inhale 2 puffs into the lungs 2 (two) times daily. 01/01/22  Yes Tyler Pita, MD  Cholecalciferol (VITAMIN D3) 1.25 MG (50000 UT) CAPS Take 1 capsule by mouth once a week. 02/09/20  Yes [provider]  diclofenac sodium (VOLTAREN) 1 % GEL Apply 2 g topically 4 (four) times daily as needed (pain). 01/28/19  Yes Lule, Joana, PA  DULoxetine (CYMBALTA) 20 MG capsule Take 20 mg by mouth daily. 07/11/21 09/25/22 Yes [provider]  fluticasone-salmeterol (ADVAIR DISKUS) 250-50 MCG/ACT AEPB Inhale 1 puff into the lungs in the morning and at bedtime. 01/06/22  Yes Tyler Pita, MD  gabapentin (NEURONTIN) 600 MG tablet Take by mouth 2 (two) times daily. 09/17/20  Yes [provider]  glipiZIDE (GLUCOTROL) 5 MG tablet Take 1 tablet (5 mg total) by mouth every morning. 01/28/19  Yes Lule, Joana, PA  ibuprofen (ADVIL) 600 MG tablet Take 1 tablet (600 mg total) by mouth every 6 (six) hours as needed. Patient taking differently: Take 800 mg by mouth every 6 (six) hours as needed for mild pain or moderate pain. 08/20/22  Yes Fondaw, Wylder S, PA  loratadine (CLARITIN) 10 MG tablet Take 1 tablet (10 mg total) by mouth daily. 01/28/19  Yes Lule, Joana, PA  losartan (COZAAR) 100 MG tablet Take 1 tablet (100 mg total) by mouth daily. 08/27/22  Yes Leonie Man, MD  metFORMIN (GLUCOPHAGE) 1000 MG tablet Take 1 tablet (1,000 mg total) by mouth 2 (two) times daily with a meal. See discharge instructions for how to take medicine at first 01/28/19  Yes Lule, Joana, PA  methocarbamol (ROBAXIN)  500 MG tablet TAKE 1 TABLET BY MOUTH EVERY 6 HOURS AS NEEDED FOR MUSCLE SPASMS 09/23/22  Yes Newt Minion, MD  metoprolol tartrate (LOPRESSOR) 50 MG tablet Take 100 mg (2 tablets) in the morning and 50 mg (1 tablet) in the evening. You may take 25 mg (1/2) extra daily as needed for palpitations. 02/06/22  Yes Leonie Man, MD  omeprazole (PRILOSEC) 40 MG capsule Take 1 capsule (40 mg total) by mouth daily. 01/01/22  Yes Tyler Pita, MD  oxyCODONE (OXY IR/ROXICODONE) 5 MG immediate release tablet Take 1 tablet (5 mg total) by mouth every 4 (four) hours as needed for severe pain. 07/08/22  Yes Piscoya, Jose, MD  Semaglutide (RYBELSUS) 7 MG TABS Take by mouth daily.   Yes [provider]  sertraline (ZOLOFT) 50 MG tablet Take 1.5 tablets (75 mg total) by mouth every morning. 01/28/19  Yes Ripley Fraise, PA    Physical Exam: BP (!) 170/92   Pulse 64   Temp 98.2 F (36.8 C)   Resp 14   SpO2 100%   General: 56 y.o. year-old female well developed well nourished in no acute distress.  Alert and oriented x3.  Mild exophthalmos noted. Cardiovascular: Regular rate and rhythm with no  rubs or gallops.  No thyromegaly or JVD noted.  No lower extremity edema. 2/4 pulses in all 4 extremities. Respiratory: Clear to auscultation with no wheezes or rales. Good inspiratory effort. Abdomen: Soft nontender nondistended with normal bowel sounds x4 quadrants. Muskuloskeletal: No cyanosis, clubbing or edema noted bilaterally Neuro: CN II-XII intact, strength, sensation, reflexes Skin: No ulcerative lesions noted or rashes Psychiatry: Judgement and insight appear normal. Mood is appropriate for condition and setting          Labs on Admission:  Basic Metabolic Panel: Recent Labs  Lab 09/25/22 1530 09/25/22 1536 09/25/22 1537  NA 139 140 138  K 2.7* 2.7* 2.7*  CL 103 101  --   CO2 26  --   --   GLUCOSE 457* 467*  --   BUN 12 11  --   CREATININE 0.99 0.80  --   CALCIUM 10.4*  --   --   MG 1.6*  --   --    Liver Function Tests: Recent Labs  Lab 09/25/22 1530  AST 25  ALT 20  ALKPHOS 112  BILITOT 0.3  PROT 6.3*  ALBUMIN 3.3*   No results for input(s): "LIPASE", "AMYLASE" in the last 168 hours. No results for input(s): "AMMONIA" in the last 168 hours. CBC: Recent Labs  Lab 09/25/22 1530 09/25/22 1536 09/25/22 1537  WBC 5.3  --   --   NEUTROABS 3.3  --   --   HGB 12.1 12.6 12.6  HCT 36.8 37.0 37.0  MCV 93.2  --   --   PLT 208  --   --    Cardiac Enzymes: No results for input(s): "CKTOTAL", "CKMB", "CKMBINDEX", "TROPONINI" in the last 168 hours.  BNP (last 3 results) No results for input(s): "BNP" in the last 8760 hours.  ProBNP (last 3 results) No results for input(s): "PROBNP" in the last 8760 hours.  CBG: Recent Labs  Lab 09/25/22 1530  GLUCAP 420*    Radiological Exams on Admission: CT ABDOMEN PELVIS W CONTRAST  Result Date: 09/25/2022 CLINICAL DATA:  Abdominal pain, lethargy, left-sided neglect EXAM: CT ABDOMEN AND PELVIS WITH CONTRAST TECHNIQUE: Multidetector CT imaging of the abdomen and pelvis was performed using the standard protocol  following bolus  administration of intravenous contrast. RADIATION DOSE REDUCTION: This exam was performed according to the departmental dose-optimization program which includes automated exposure control, adjustment of the mA and/or kV according to patient size and/or use of iterative reconstruction technique. CONTRAST:  25m OMNIPAQUE IOHEXOL 350 MG/ML SOLN COMPARISON:  06/21/2022 FINDINGS: Lower chest: Scattered hypoventilatory changes at the lung bases. Stable nodular consolidation in the lingula compatible with atelectasis or scarring, unchanged since 2021. Hepatobiliary: Scattered subcentimeter liver hypodensities are unchanged, compatible with multiple cysts. No biliary duct dilation. Gallbladder is unremarkable. Pancreas: There is a 7 mm hypodensity within the tail the pancreas reference image 28/3. The remainder of the pancreas is unremarkable. Spleen: Normal in size without focal abnormality. Adrenals/Urinary Tract: Stable indeterminate 2.9 cm left adrenal mass, previously characterized as adenoma. Right adrenal is unremarkable. The kidneys are stable, with symmetrical enhancement. Excreted contrast within the bladder consistent with previous CT and MRI procedures performed earlier today. No filling defects. Stomach/Bowel: No bowel obstruction or ileus. Scattered colonic diverticulosis without diverticulitis. Normal appendix right lower quadrant. No bowel wall thickening or inflammatory change. Vascular/Lymphatic: No significant vascular findings are present. No enlarged abdominal or pelvic lymph nodes. Reproductive: Status post hysterectomy. No adnexal masses. Pessary in place. Other: No free fluid or free intraperitoneal gas. No abdominal wall hernia. Musculoskeletal: No acute or destructive bony lesions. Reconstructed images demonstrate no additional findings. IMPRESSION: 1. No acute intra-abdominal or intrapelvic process. 2. Distal colonic diverticulosis without diverticulitis. 3. Incidental 7 mm cyst  within the pancreatic tail. Recommend follow up pre and post contrast MRI/MRCP or pancreatic protocol CT in 1 year. This recommendation follows ACR consensus guidelines: Management of Incidental Pancreatic Cysts: A White Paper of the ACR Incidental Findings Committee. J Am Coll Radiol 24196;22:297-989 4. Stable left adrenal mass, previously characterized as adenoma. Electronically Signed   By: MRanda NgoM.D.   On: 09/25/2022 21:48   MR BRAIN WO CONTRAST  Result Date: 09/25/2022 CLINICAL DATA:  Neuro deficit acute, stroke suspected. Left-sided neglect noted. Encephalopathy. Extreme hyperglycemia. Extreme hypertension. EXAM: MRI HEAD WITHOUT CONTRAST TECHNIQUE: Multiplanar, multiecho pulse sequences of the brain and surrounding structures were obtained without intravenous contrast. COMPARISON:  CT head without contrast 09/25/2022. MR head without contrast 04/21/2020 FINDINGS: Brain: Progressive moderate periventricular T2 hyperintensities are present bilaterally. Scattered subcortical T2 hyperintensities have progressed as well. No acute infarct, hemorrhage, or mass lesion is present. The ventricles are of normal size. Mild generalized atrophy is progressed. No significant extraaxial fluid collection is present. White matter changes involving brainstem progressed. Cerebellum is within limits. Vascular: Flow is present in the major intracranial arteries. Skull and upper cervical spine: The craniocervical junction is normal. Upper cervical spine is within normal limits. Marrow signal is unremarkable. Sinuses/Orbits: The paranasal sinuses and mastoid air cells are clear. The globes and orbits are within normal limits. IMPRESSION: 1. No acute intracranial abnormality. 2. Progressive moderate periventricular and subcortical T2 hyperintensities bilaterally. This most likely reflects the sequelae of microvascular ischemia. Electronically Signed   By: CSan MorelleM.D.   On: 09/25/2022 20:56   DG CHEST PORT  1 VIEW  Result Date: 09/25/2022 CLINICAL DATA:  Lethargy, weakness, code stroke EXAM: PORTABLE CHEST 1 VIEW COMPARISON:  08/20/2022 FINDINGS: Transverse diameter of heart is increased. There are no signs of pulmonary edema or focal pulmonary consolidation. There is no pleural effusion or pneumothorax. IMPRESSION: Cardiomegaly. There are no signs of pulmonary edema or focal pulmonary consolidation. Electronically Signed   By: PElmer PickerM.D.   On: 09/25/2022 17:04  CT ANGIO HEAD NECK W WO CM W PERF (CODE STROKE)  Result Date: 09/25/2022 CLINICAL DATA:  Headache, sudden, severe Subarachnoid hemorrhage [facility]). Left-sided neglect. EXAM: CT ANGIOGRAPHY HEAD AND NECK CT PERFUSION BRAIN TECHNIQUE: Multidetector CT imaging of the head and neck was performed using the standard protocol during bolus administration of intravenous contrast. Multiplanar CT image reconstructions and MIPs were obtained to evaluate the vascular anatomy. Carotid stenosis measurements (when applicable) are obtained utilizing NASCET criteria, using the distal internal carotid diameter as the denominator. Multiphase CT imaging of the brain was performed following IV bolus contrast injection. Subsequent parametric perfusion maps were calculated using RAPID software. RADIATION DOSE REDUCTION: This exam was performed according to the departmental dose-optimization program which includes automated exposure control, adjustment of the mA and/or kV according to patient size and/or use of iterative reconstruction technique. CONTRAST:  157m OMNIPAQUE IOHEXOL 350 MG/ML SOLN COMPARISON:  None Available. FINDINGS: CTA NECK FINDINGS Aortic arch: Standard 3 vessel aortic arch with mild atherosclerotic plaque. Widely patent arch vessel origins. Right carotid system: Patent without evidence of stenosis, dissection, or significant atherosclerosis. Tortuous distal cervical ICA. Left carotid system: Patent without evidence of stenosis, dissection, or  significant atherosclerosis. Tortuous distal cervical ICA. Vertebral arteries: Patent without evidence of stenosis, dissection, or significant atherosclerosis. Codominant. Skeleton: Minor cervical spondylosis. Other neck: Partially calcified 1.7 cm right thyroid nodule; this has been evaluated on previous imaging (thyroid ultrasound and biopsy). No evidence of cervical lymphadenopathy. Upper chest: Clear lung apices. Review of the MIP images confirms the above findings CTA HEAD FINDINGS Anterior circulation: The internal carotid arteries are widely patent from skull base to carotid termini. ACAs and MCAs are patent without evidence of a proximal branch occlusion or significant proximal stenosis. No aneurysm is identified. Posterior circulation: The intracranial vertebral arteries are widely patent to the basilar. Patent PICA and SCA origins are seen bilaterally. The basilar artery is widely patent. There are small bilateral posterior communicating arteries, right larger than left. Both PCAs are patent without evidence of a significant proximal stenosis. No aneurysm is identified. Venous sinuses: Patent. Anatomic variants: None. Review of the MIP images confirms the above findings CT Brain Perfusion Findings: ASPECTS: 10 CBF (<30%) Volume: 0 mL Perfusion (Tmax>6.0s) volume: 0 mL Mismatch Volume: 0 mL Infarction Location: No infarct identified by CTP. These results were communicated to Dr. ARory Percyat 3:57 p.m. on 09/25/2022 by text page via the [facility]messaging system. IMPRESSION: 1. No large vessel occlusion or significant stenosis in the head or neck. 2. Negative CTP. 3.  Aortic Atherosclerosis (ICD10-I70.0). Electronically Signed   By: ALogan BoresM.D.   On: 09/25/2022 16:06   CT HEAD CODE STROKE WO CONTRAST  Result Date: 09/25/2022 CLINICAL DATA:  Code stroke. Neuro deficit, acute, stroke suspected. Left-sided neglect. EXAM: CT HEAD WITHOUT CONTRAST TECHNIQUE: Contiguous axial images were obtained from the base  of the skull through the vertex without intravenous contrast. RADIATION DOSE REDUCTION: This exam was performed according to the departmental dose-optimization program which includes automated exposure control, adjustment of the mA and/or kV according to patient size and/or use of iterative reconstruction technique. COMPARISON:  Head CT 08/20/2022 FINDINGS: Brain: There is no evidence of an acute infarct, intracranial hemorrhage, mass, midline shift, or extra-axial fluid collection. The ventricles and sulci are normal. A small cavum septum pellucidum et vergae is noted. 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 Vascular: No hyperdense vessel. Skull: No fracture or suspicious osseous lesion. Sinuses/Orbits: Visualized paranasal sinuses  and mastoid air cells are clear. Unremarkable orbits. Other: None. ASPECTS [facility] Stroke Program Early CT Score) - Ganglionic level infarction (caudate, lentiform nuclei, internal capsule, insula, M1-M3 cortex): 7 - Supraganglionic infarction (M4-M6 cortex): 3 Total score (0-10 with 10 being normal): 10 IMPRESSION: No evidence of acute intracranial abnormality. ASPECTS of 10. These results were communicated to Dr. Rory Percy at 3:47 pm on 09/25/2022 by text page via the [facility] messaging system. Electronically Signed   By: Logan Bores M.D.   On: 09/25/2022 15:47    EKG: I independently viewed the EKG done and my findings are as followed: Sinus rhythm rate of 63.  Nonspecific ST-T changes.  QTc 406.  Assessment/Plan Present on Admission: . AMS (altered mental status)  Principal Problem:   AMS (altered mental status) Acute metabolic encephalopathy, resolving Suspect multifactorial, hyperglycemia versus others CT and MRI brain were non acute UA negative for pyuria Obtain TSH and EEG Reorient as needed Fall precautions  Dizziness, unclear etiology MRI brain was negative for acute CVA. Orthostatic vital signs OT evaluation in the morning Fall  precautions  Type 2 diabetes with hyperglycemia Presenting serum glucose greater than 400 IV fluid hydration Hold off home oral hypoglycemics Insulin sliding scale.  Hypokalemia Serum potassium 2.7 Repleted intravenously Repeat BMP in the morning  Hypomagnesemia Serum magnesium 1.6 Repleted intravenously Repeat level in the morning  Mild hypercalcemia Calcium 10.4 IV fluid hydration Repeat chemistry panel in the morning  Recently diagnosed thyroid cancer   DVT prophylaxis: ***   Code Status: ***   Family Communication: ***   Disposition Plan: ***   Consults called: ***   Admission status: ***    Status is: Observation {Observation:23811}   Kayleen Memos MD [facility] Pager [phone]  If 7PM-7AM, please contact night-coverage [email or web address] Password [email or web address]  09/25/2022, 11:19 PM

## 2022-09-25 NOTE — Code Documentation (Signed)
Stroke Response Nurse Documentation Code Documentation  SKILYNN [name] is a 56 y.o. female arriving to [facility]  via [employer] on 09/25/22 with past medical hx of chronic back pain, depression, diabetes, gastroesophageal reflux, hypertension, short runs of atrial tachycardia noted on long-term monitoring due to history of palpitations. On aspirin 81 mg daily. Code stroke was activated by ED.   Patient from home where she was LKW at 2330 on 09/24/22 and now complaining of weakness, aphasia. Pt was awake at 0530 and noted to have left sided weakness. She says she was also dizzy. She was not taken to [facility] until around 1530 by her daughter. Medical staff had to carry her out of the vehicle. CBG 420. Code stroke activated by Resident MD.   Stroke team at the bedside on patient arrival to CT. Labs drawn and patient cleared for CT by Dr. Oswaldo Milian. NIHSS 15, see documentation for details and code stroke times. Patient with decreased LOC, disoriented, bilateral arm weakness, bilateral leg weakness, Expressive aphasia , and dysarthria  on exam. The following imaging was completed:  CT Head, CTA, and CTP. Patient is not a candidate for IV Thrombolytic due to outside the window. Patient is not a candidate for IR due to no LVO per MD.   Care Plan: STAT MRI, Q2 vitals and neuro checks.   Bedside handoff with ED RN Carlis Abbott.    [name], Rande Brunt  Stroke Response RN

## 2022-09-25 NOTE — Consult Note (Signed)
Neurology Consultation  Reason for Consult: Code stroke-weakness Referring Physician: Dr. Shirlyn Goltz  CC: Weakness  History is obtained from: Chart  HPI: [name] is a 56 y.o. female past medical history of chronic back pain, depression, diabetes, gastroesophageal reflux, hypertension, short runs of atrial tachycardia noted on long-term monitoring due to history of palpitations, brought in by a family member for evaluation for weakness and altered mental status.  Concern in the ED for possible left-sided neglect for which a code stroke was activated Last known well 11:30 PM last night Patient unable to provide history Noncontrast head CT unremarkable CTA CT perfusion negative  LKW: 11:30 PM 09/24/2022 IV thrombolysis given?: no, outside the window Premorbid modified Rankin scale (mRS): Unable to ascertain  ROS: Unable to obtain due to altered mental status.   Past Medical History:  Diagnosis Date   Acute diverticulitis    Acute pain of right knee 10/14/2016   Acute renal failure (ARF) ([place]) 01/26/2019   Anemia    Arthritis    Chest pain    a. 12/2021 Cor CTA: LM nl, LAD <25%, LCX nl, RCA nl. Cor Ca2+ = 1.29.   Chronic back pain    Complicated by neuropathy. ->  On Neurontin and duloxetine along with Flexeril and Voltaren gel.  Also uses PRN tramadol.   COPD with asthma ([place])    On combination of albuterol Qvar and Symbicort   Depression    Diabetes mellitus without complication ([place])    Diastolic dysfunction    a. 09/2015 Echo: EF 55-60%, GrI DD; b. 12/2021 Echo: EF 60-65%, no rwma, GrI DD, nl RV fxn. RVSP 28.22mHg. Mild MR.   Diverticulitis large intestine 05/12/2020   Dyspnea    GERD (gastroesophageal reflux disease)    Heart murmur    Hypertension    Palpitations    a. 12/2021 [facility]: Predominantly sinus rhythm at 80 (38-136).  Frequent PACs (5.3%).  5 short runs of atrial tachycardia (longest 33 beats at 119 bpm.  Fastest 160 bpm).  Triggered events associated PACs and  short runs of PAT.   Urinary incontinence    Pessary present     Family History  Problem Relation Age of Onset   Hypertension Mother    Other Other      Social History:   reports that she quit smoking about 17 years ago. Her smoking use included cigarettes. She has a 30.00 pack-year smoking history. She has never used smokeless tobacco. She reports that she does not drink alcohol and does not use drugs.  Medications No current facility-administered medications for this encounter.  Current Outpatient Medications:    acetaminophen (TYLENOL) 500 MG tablet, Take 2 tablets (1,000 mg total) by mouth every 6 (six) hours as needed. (Patient not taking: Reported on 09/18/2022), Disp: 30 tablet, Rfl: 0   albuterol (PROVENTIL) (2.5 MG/3ML) 0.083% nebulizer solution, Take 3 mLs (2.5 mg total) by nebulization every 2 (two) hours as needed for wheezing or shortness of breath., Disp: 75 mL, Rfl: 0   albuterol (VENTOLIN HFA) 108 (90 Base) MCG/ACT inhaler, Inhale 2 puffs into the lungs every 6 (six) hours as needed for wheezing or shortness of breath., Disp: 8 g, Rfl: 2   aspirin EC 81 MG tablet, Take 81 mg by mouth daily., Disp: , Rfl:    atorvastatin (LIPITOR) 10 MG tablet, Take 10 mg by mouth daily., Disp: , Rfl:    budesonide-formoterol (SYMBICORT) 160-4.5 MCG/ACT inhaler, Inhale 2 puffs into the lungs 2 (two) times daily.,  Disp: 1 each, Rfl: 12   Cholecalciferol (VITAMIN D3) 1.25 MG (50000 UT) CAPS, Take 1 capsule by mouth once a week., Disp: , Rfl:    diclofenac sodium (VOLTAREN) 1 % GEL, Apply 2 g topically 4 (four) times daily as needed (pain)., Disp: 1 Tube, Rfl: 0   DULoxetine (CYMBALTA) 20 MG capsule, Take 20 mg by mouth daily., Disp: , Rfl:    fluticasone-salmeterol (ADVAIR DISKUS) 250-50 MCG/ACT AEPB, Inhale 1 puff into the lungs in the morning and at bedtime., Disp: 60 each, Rfl: 2   gabapentin (NEURONTIN) 600 MG tablet, Take by mouth 2 (two) times daily., Disp: , Rfl:    glipiZIDE  (GLUCOTROL) 5 MG tablet, Take 1 tablet (5 mg total) by mouth every morning., Disp: 30 tablet, Rfl: 0   ibuprofen (ADVIL) 600 MG tablet, Take 1 tablet (600 mg total) by mouth every 6 (six) hours as needed., Disp: 30 tablet, Rfl: 0   loratadine (CLARITIN) 10 MG tablet, Take 1 tablet (10 mg total) by mouth daily., Disp: 30 tablet, Rfl: 0   losartan (COZAAR) 100 MG tablet, Take 1 tablet (100 mg total) by mouth daily., Disp: 90 tablet, Rfl: 3   metFORMIN (GLUCOPHAGE) 1000 MG tablet, Take 1 tablet (1,000 mg total) by mouth 2 (two) times daily with a meal. See discharge instructions for how to take medicine at first, Disp: 60 tablet, Rfl: 1   methocarbamol (ROBAXIN) 500 MG tablet, TAKE 1 TABLET BY MOUTH EVERY 6 HOURS AS NEEDED FOR MUSCLE SPASMS, Disp: 30 tablet, Rfl: 0   metoprolol tartrate (LOPRESSOR) 50 MG tablet, Take 100 mg (2 tablets) in the morning and 50 mg (1 tablet) in the evening. You may take 25 mg (1/2) extra daily as needed for palpitations., Disp: 560 tablet, Rfl: 3   omeprazole (PRILOSEC) 40 MG capsule, Take 1 capsule (40 mg total) by mouth daily., Disp: 30 capsule, Rfl: 2   oxyCODONE (OXY IR/ROXICODONE) 5 MG immediate release tablet, Take 1 tablet (5 mg total) by mouth every 4 (four) hours as needed for severe pain., Disp: 30 tablet, Rfl: 0   Semaglutide (RYBELSUS) 7 MG TABS, Take by mouth daily., Disp: , Rfl:    sertraline (ZOLOFT) 50 MG tablet, Take 1.5 tablets (75 mg total) by mouth every morning., Disp: 45 tablet, Rfl: 0   Exam: Current vital signs: BP (!) 203/111 (BP Location: Right Arm)   Pulse 79   Temp 98.2 F (36.8 C) (Oral)   Resp 14   SpO2 98%  Vital signs in last 24 hours: Temp:  [98.2 F (36.8 C)] 98.2 F (36.8 C) (10/05 1525) Pulse Rate:  [79] 79 (10/05 1525) Resp:  [14] 14 (10/05 1525) BP: (203)/(111) 203/111 (10/05 1525) SpO2:  [98 %] 98 % (10/05 1525) Extremely hypertensive General: She is drowsy and lethargic in no apparent distress HEENT: Normocephalic  atraumatic CVS: Regular rate rhythm Lungs: Breathing well saturating normally on room air Neurological exam Drowsy and lethargic Does not follow commands Appears to open the eyes and track examiner inconsistently Cranial: Pupils equal round reactive light, difficult to ascertain eye movements but does not seem to have a forced deviation or preference, face appears grossly symmetric, blinks to threat inconsistently from both sides Motor examination with spontaneous movement and withdrawal to noxious stimulation to all fours Sensation: As above Coordination and gait difficult to assess given her mentation  NIHSS 1a Level of Conscious.: 1 1b LOC Questions: 2 1c LOC Commands: 2 2 Best Gaze: 0 3 Visual: 0 4  Facial Palsy: 0 5a Motor Arm - left: 1 5b Motor Arm - Right: 1 6a Motor Leg - Left: 1 6b Motor Leg - Right: 1 7 Limb Ataxia: 0 8 Sensory: 0 9 Best Language: 3 10 Dysarthria: 2 11 Extinct. and Inatten.: 0 TOTAL: 14   Labs I have reviewed labs in epic and the results pertinent to this consultation are:  CBC    Component Value Date/Time   WBC 5.3 09/25/2022 1530   RBC 3.95 09/25/2022 1530   HGB 12.6 09/25/2022 1537   HGB 14.7 09/17/2021 1456   HCT 37.0 09/25/2022 1537   HCT 44.4 09/17/2021 1456   PLT 208 09/25/2022 1530   PLT 251 09/17/2021 1456   MCV 93.2 09/25/2022 1530   MCV 90 09/17/2021 1456   MCH 30.6 09/25/2022 1530   MCHC 32.9 09/25/2022 1530   RDW 13.9 09/25/2022 1530   RDW 13.2 09/17/2021 1456   LYMPHSABS 1.5 09/25/2022 1530   MONOABS 0.5 09/25/2022 1530   EOSABS 0.0 09/25/2022 1530   BASOSABS 0.0 09/25/2022 1530    CMP     Component Value Date/Time   NA 138 09/25/2022 1537   NA 140 12/26/2021 1018   K 2.7 (LL) 09/25/2022 1537   CL 101 09/25/2022 1536   CO2 27 08/20/2022 1125   GLUCOSE 467 (H) 09/25/2022 1536   BUN 11 09/25/2022 1536   BUN 9 12/26/2021 1018   CREATININE 0.80 09/25/2022 1536   CALCIUM 9.6 08/20/2022 1125   PROT 7.1 06/20/2022  2353   PROT 7.0 09/17/2021 1456   ALBUMIN 4.0 06/20/2022 2353   ALBUMIN 4.2 09/17/2021 1456   AST 20 06/20/2022 2353   ALT 24 06/20/2022 2353   ALKPHOS 101 06/20/2022 2353   BILITOT 0.8 06/20/2022 2353   BILITOT 0.4 09/17/2021 1456   GFRNONAA >60 08/20/2022 1125   GFRAA >60 05/13/2020 0423  Potassium 2.7, glucose 467  Imaging I have reviewed the images obtained:  CT-head: Aspects 10 CT angio head and neck and CT perfusion study-no emergent LVO  Assessment:  56 year old with above past medical history brought in by family for evaluation of altered mental status, noted to have possible left-sided weakness and neglect on exam for which an LVO positive code stroke was activated. On exam she appears to be mostly encephalopathic without focality CT head and CT angio head and neck are unremarkable Came in extremely hyperglycemic and extremely hypertensive Current picture consistent with toxic metabolic encephalopathy versus hypertensive encephalopathy. Will need further imaging to rule out stroke and posterior reversible encephalopathy syndrome and hypertensive emergency.  Recommendations: Stat MRI of the brain If she has a stroke, will need stroke work-up otherwise she needs management of her hyperglycemia, hypertensive emergency. Can obtain a routine EEG if her mentation does not improve Blood pressure goal question was asked by the ED provider: If she has a stroke, allow permissive hypertension and treat systolic blood pressure if it is higher than 220 on a as needed basis. If she does not have a stroke, treat this like hypertensive urgency and reduce blood pressure by 20% each day.  I will follow-up the MRI with you  Plan relayed to Dr. Darl Householder  -- Amie Portland, MD Neurologist [facility] Pager: [phone]

## 2022-09-25 NOTE — ED Notes (Signed)
Patient transported to MRI 

## 2022-09-25 NOTE — ED Notes (Addendum)
Patient requested to go use rr, was able to turn on her left and use a bedpan. Pt was able to express she wanted a purwick.

## 2022-09-25 NOTE — Discharge Instructions (Addendum)
Incidental 7 mm cyst within the pancreatic tail. Recommend follow  up pre and post contrast MRI/MRCP or pancreatic protocol CT in 1  year. This recommendation follows ACR consensus guidelines

## 2022-09-25 NOTE — H&P (Addendum)
History and Physical  CURLEY [name]:[id number] DOB: May 17, 1966 DOA: 09/25/2022  Referring physician: Dr. Oswaldo Milian, EDP  PCP: [name], MD  Outpatient Specialists: Medical oncology. Patient coming from: Home.  Chief Complaint: Altered mental status and left-sided weakness.  HPI: [name] is a 56 y.o. female with medical history significant for hypertension, hyperlipidemia, type 2 diabetes, recently diagnosed thyroid cancer followed by hematology oncology at [facility] who presented to [facility] ED due to altered mental status and left-sided weakness.  Last known well was around 11:30 PM.  Upon arrival to the ED, there was concern for possible left sided neglect for which a code stroke was called.  The patient was seen by neurology/stroke team and CVA work up was initiated.    At the time of this visit, the patient's confusion had improved.  Endorses sudden onset dizziness after waking up this morning.  Associated with a severe headache and left arm weakness.  States she had surgery to her left upper extremity about 2 months ago and has had intermittent pain, numbness and tingling affecting the same arm.  States she smokes marijuana to help with the pain.  CT head and MRI brain were negative for acute CVA.  TRH, hospitalist service, was asked to admit for further evaluation.    ED Course: Tmax 98.2.  BP 154/124, pulse 55, respiration rate 23, O2 saturation 99% on room air.  Lab studies were remarkable for serum potassium 2.7, serum glucose 467, magnesium 1.6, calcium 10.4.  CBC with differentials were essentially unremarkable.  Review of Systems: Review of systems as noted in the HPI. All other systems reviewed and are negative.   Past Medical History:  Diagnosis Date   Acute diverticulitis    Acute pain of right knee 10/14/2016   Acute renal failure (ARF) ([place]) 01/26/2019   Anemia    Arthritis    Chest pain    a. 12/2021 Cor CTA: LM nl, LAD <25%, LCX nl, RCA nl. Cor Ca2+ = 1.29.    Chronic back pain    Complicated by neuropathy. ->  On Neurontin and duloxetine along with Flexeril and Voltaren gel.  Also uses PRN tramadol.   COPD with asthma ([place])    On combination of albuterol Qvar and Symbicort   Depression    Diabetes mellitus without complication ([place])    Diastolic dysfunction    a. 09/2015 Echo: EF 55-60%, GrI DD; b. 12/2021 Echo: EF 60-65%, no rwma, GrI DD, nl RV fxn. RVSP 28.21mHg. Mild MR.   Diverticulitis large intestine 05/12/2020   Dyspnea    GERD (gastroesophageal reflux disease)    Heart murmur    Hypertension    Palpitations    a. 12/2021 Zio: Predominantly sinus rhythm at 80 (38-136).  Frequent PACs (5.3%).  5 short runs of atrial tachycardia (longest 33 beats at 119 bpm.  Fastest 160 bpm).  Triggered events associated PACs and short runs of PAT.   Urinary incontinence    Pessary present   Past Surgical History:  Procedure Laterality Date   7-Day Zio Patch Monitor  12/2021   Predominant sinus rhythm.  Rate range 38-136 bpm with an average of 80 bpm (bradycardia only noted during hours of sleep.  5 Atrial Runs-fastest was 5 beats at a max rate 162 bpm, longest was 17.2 seconds/33 beats at a rate of 119 bpm--not noted on patient trigger.  Frequent PACs noted (5.3%).  Rare PVCs.   ABDOMINAL HYSTERECTOMY     COLONOSCOPY WITH PROPOFOL N/A 10/02/2021  Procedure: COLONOSCOPY WITH PROPOFOL;  Surgeon: Lin Landsman, MD;  Location[facility] ENDOSCOPY;  Service: Gastroenterology;  Laterality: N/A;   Coronary CT angiogram  01/03/2022   Thoracic aortic atherosclerosis.  Coronary Calcium Score 1.29.  Minimal proximal LAD calcification. (<25%).   ESOPHAGOGASTRODUODENOSCOPY N/A 10/02/2021   Procedure: ESOPHAGOGASTRODUODENOSCOPY (EGD);  Surgeon: Lin Landsman, MD;  Location: [facility] ENDOSCOPY;  Service: Gastroenterology;  Laterality: N/A;   TRANSTHORACIC ECHOCARDIOGRAM  09/29/2015   EF 55-60%.  No R WMA.  GR 1 DD.  Is also normal valves.  Normal study.    TRANSTHORACIC ECHOCARDIOGRAM  01/09/2022   EF 60 to 65%.  Mild to moderate LVH.  G1 DD.  Normal RV size and function.  Normal atrial sizes.  Normal valves.  NORMAL ECHO   TUBAL LIGATION      Social History:  reports that she quit smoking about 17 years ago. Her smoking use included cigarettes. She has a 30.00 pack-year smoking history. She has never used smokeless tobacco. She reports that she does not drink alcohol and does not use drugs.   No Known Allergies  Family History  Problem Relation Age of Onset   Hypertension Mother    Other Other       Prior to Admission medications   Medication Sig Start Date End Date Taking? Authorizing Provider  albuterol (PROVENTIL) (2.5 MG/3ML) 0.083% nebulizer solution Take 3 mLs (2.5 mg total) by nebulization every 2 (two) hours as needed for wheezing or shortness of breath. 01/28/19  Yes Lule, Joana, PA  albuterol (VENTOLIN HFA) 108 (90 Base) MCG/ACT inhaler Inhale 2 puffs into the lungs every 6 (six) hours as needed for wheezing or shortness of breath. 01/01/22  Yes Tyler Pita, MD  atorvastatin (LIPITOR) 10 MG tablet Take 10 mg by mouth daily.   Yes [provider]  budesonide-formoterol (SYMBICORT) 160-4.5 MCG/ACT inhaler Inhale 2 puffs into the lungs 2 (two) times daily. 01/01/22  Yes Tyler Pita, MD  Cholecalciferol (VITAMIN D3) 1.25 MG (50000 UT) CAPS Take 1 capsule by mouth once a week. 02/09/20  Yes [provider]  diclofenac sodium (VOLTAREN) 1 % GEL Apply 2 g topically 4 (four) times daily as needed (pain). 01/28/19  Yes Lule, Joana, PA  DULoxetine (CYMBALTA) 20 MG capsule Take 20 mg by mouth daily. 07/11/21 09/25/22 Yes [provider]  fluticasone-salmeterol (ADVAIR DISKUS) 250-50 MCG/ACT AEPB Inhale 1 puff into the lungs in the morning and at bedtime. 01/06/22  Yes Tyler Pita, MD  gabapentin (NEURONTIN) 600 MG tablet Take by mouth 2 (two) times daily. 09/17/20  Yes [provider]  glipiZIDE  (GLUCOTROL) 5 MG tablet Take 1 tablet (5 mg total) by mouth every morning. 01/28/19  Yes Lule, Joana, PA  ibuprofen (ADVIL) 600 MG tablet Take 1 tablet (600 mg total) by mouth every 6 (six) hours as needed. Patient taking differently: Take 800 mg by mouth every 6 (six) hours as needed for mild pain or moderate pain. 08/20/22  Yes Fondaw, Wylder S, PA  loratadine (CLARITIN) 10 MG tablet Take 1 tablet (10 mg total) by mouth daily. 01/28/19  Yes Lule, Joana, PA  losartan (COZAAR) 100 MG tablet Take 1 tablet (100 mg total) by mouth daily. 08/27/22  Yes Leonie Man, MD  metFORMIN (GLUCOPHAGE) 1000 MG tablet Take 1 tablet (1,000 mg total) by mouth 2 (two) times daily with a meal. See discharge instructions for how to take medicine at first 01/28/19  Yes Lule, Joana, PA  methocarbamol (ROBAXIN)  500 MG tablet TAKE 1 TABLET BY MOUTH EVERY 6 HOURS AS NEEDED FOR MUSCLE SPASMS 09/23/22  Yes Newt Minion, MD  metoprolol tartrate (LOPRESSOR) 50 MG tablet Take 100 mg (2 tablets) in the morning and 50 mg (1 tablet) in the evening. You may take 25 mg (1/2) extra daily as needed for palpitations. 02/06/22  Yes Leonie Man, MD  omeprazole (PRILOSEC) 40 MG capsule Take 1 capsule (40 mg total) by mouth daily. 01/01/22  Yes Tyler Pita, MD  oxyCODONE (OXY IR/ROXICODONE) 5 MG immediate release tablet Take 1 tablet (5 mg total) by mouth every 4 (four) hours as needed for severe pain. 07/08/22  Yes Piscoya, Jose, MD  Semaglutide (RYBELSUS) 7 MG TABS Take by mouth daily.   Yes [provider]  sertraline (ZOLOFT) 50 MG tablet Take 1.5 tablets (75 mg total) by mouth every morning. 01/28/19  Yes Ripley Fraise, PA    Physical Exam: BP (!) 170/92   Pulse 64   Temp 98.2 F (36.8 C)   Resp 14   SpO2 100%   General: 56 y.o. year-old female well developed well nourished in no acute distress.  Alert and oriented x3.  Mild exophthalmos noted. Cardiovascular: Regular rate and rhythm with no rubs or gallops.  No  thyromegaly or JVD noted.  No lower extremity edema. 2/4 pulses in all 4 extremities. Respiratory: Clear to auscultation with no wheezes or rales. Good inspiratory effort. Abdomen: Soft nontender nondistended with normal bowel sounds x4 quadrants. Muskuloskeletal: No cyanosis, clubbing or edema noted bilaterally Neuro: CN II-XII intact, strength, sensation, reflexes Skin: No ulcerative lesions noted or rashes Psychiatry: Judgement and insight appear normal. Mood is appropriate for condition and setting          Labs on Admission:  Basic Metabolic Panel: Recent Labs  Lab 09/25/22 1530 09/25/22 1536 09/25/22 1537  NA 139 140 138  K 2.7* 2.7* 2.7*  CL 103 101  --   CO2 26  --   --   GLUCOSE 457* 467*  --   BUN 12 11  --   CREATININE 0.99 0.80  --   CALCIUM 10.4*  --   --   MG 1.6*  --   --    Liver Function Tests: Recent Labs  Lab 09/25/22 1530  AST 25  ALT 20  ALKPHOS 112  BILITOT 0.3  PROT 6.3*  ALBUMIN 3.3*   No results for input(s): "LIPASE", "AMYLASE" in the last 168 hours. No results for input(s): "AMMONIA" in the last 168 hours. CBC: Recent Labs  Lab 09/25/22 1530 09/25/22 1536 09/25/22 1537  WBC 5.3  --   --   NEUTROABS 3.3  --   --   HGB 12.1 12.6 12.6  HCT 36.8 37.0 37.0  MCV 93.2  --   --   PLT 208  --   --    Cardiac Enzymes: No results for input(s): "CKTOTAL", "CKMB", "CKMBINDEX", "TROPONINI" in the last 168 hours.  BNP (last 3 results) No results for input(s): "BNP" in the last 8760 hours.  ProBNP (last 3 results) No results for input(s): "PROBNP" in the last 8760 hours.  CBG: Recent Labs  Lab 09/25/22 1530  GLUCAP 420*    Radiological Exams on Admission: CT ABDOMEN PELVIS W CONTRAST  Result Date: 09/25/2022 CLINICAL DATA:  Abdominal pain, lethargy, left-sided neglect EXAM: CT ABDOMEN AND PELVIS WITH CONTRAST TECHNIQUE: Multidetector CT imaging of the abdomen and pelvis was performed using the standard protocol following bolus  administration of intravenous contrast. RADIATION DOSE REDUCTION: This exam was performed according to the departmental dose-optimization program which includes automated exposure control, adjustment of the mA and/or kV according to patient size and/or use of iterative reconstruction technique. CONTRAST:  31m OMNIPAQUE IOHEXOL 350 MG/ML SOLN COMPARISON:  06/21/2022 FINDINGS: Lower chest: Scattered hypoventilatory changes at the lung bases. Stable nodular consolidation in the lingula compatible with atelectasis or scarring, unchanged since 2021. Hepatobiliary: Scattered subcentimeter liver hypodensities are unchanged, compatible with multiple cysts. No biliary duct dilation. Gallbladder is unremarkable. Pancreas: There is a 7 mm hypodensity within the tail the pancreas reference image 28/3. The remainder of the pancreas is unremarkable. Spleen: Normal in size without focal abnormality. Adrenals/Urinary Tract: Stable indeterminate 2.9 cm left adrenal mass, previously characterized as adenoma. Right adrenal is unremarkable. The kidneys are stable, with symmetrical enhancement. Excreted contrast within the bladder consistent with previous CT and MRI procedures performed earlier today. No filling defects. Stomach/Bowel: No bowel obstruction or ileus. Scattered colonic diverticulosis without diverticulitis. Normal appendix right lower quadrant. No bowel wall thickening or inflammatory change. Vascular/Lymphatic: No significant vascular findings are present. No enlarged abdominal or pelvic lymph nodes. Reproductive: Status post hysterectomy. No adnexal masses. Pessary in place. Other: No free fluid or free intraperitoneal gas. No abdominal wall hernia. Musculoskeletal: No acute or destructive bony lesions. Reconstructed images demonstrate no additional findings. IMPRESSION: 1. No acute intra-abdominal or intrapelvic process. 2. Distal colonic diverticulosis without diverticulitis. 3. Incidental 7 mm cyst within the  pancreatic tail. Recommend follow up pre and post contrast MRI/MRCP or pancreatic protocol CT in 1 year. This recommendation follows ACR consensus guidelines: Management of Incidental Pancreatic Cysts: A White Paper of the ACR Incidental Findings Committee. J Am Coll Radiol [id number] 4. Stable left adrenal mass, previously characterized as adenoma. Electronically Signed   By: MRanda NgoM.D.   On: 09/25/2022 21:48   MR BRAIN WO CONTRAST  Result Date: 09/25/2022 CLINICAL DATA:  Neuro deficit acute, stroke suspected. Left-sided neglect noted. Encephalopathy. Extreme hyperglycemia. Extreme hypertension. EXAM: MRI HEAD WITHOUT CONTRAST TECHNIQUE: Multiplanar, multiecho pulse sequences of the brain and surrounding structures were obtained without intravenous contrast. COMPARISON:  CT head without contrast 09/25/2022. MR head without contrast 04/21/2020 FINDINGS: Brain: Progressive moderate periventricular T2 hyperintensities are present bilaterally. Scattered subcortical T2 hyperintensities have progressed as well. No acute infarct, hemorrhage, or mass lesion is present. The ventricles are of normal size. Mild generalized atrophy is progressed. No significant extraaxial fluid collection is present. White matter changes involving brainstem progressed. Cerebellum is within limits. Vascular: Flow is present in the major intracranial arteries. Skull and upper cervical spine: The craniocervical junction is normal. Upper cervical spine is within normal limits. Marrow signal is unremarkable. Sinuses/Orbits: The paranasal sinuses and mastoid air cells are clear. The globes and orbits are within normal limits. IMPRESSION: 1. No acute intracranial abnormality. 2. Progressive moderate periventricular and subcortical T2 hyperintensities bilaterally. This most likely reflects the sequelae of microvascular ischemia. Electronically Signed   By: CSan MorelleM.D.   On: 09/25/2022 20:56   DG CHEST PORT 1  VIEW  Result Date: 09/25/2022 CLINICAL DATA:  Lethargy, weakness, code stroke EXAM: PORTABLE CHEST 1 VIEW COMPARISON:  08/20/2022 FINDINGS: Transverse diameter of heart is increased. There are no signs of pulmonary edema or focal pulmonary consolidation. There is no pleural effusion or pneumothorax. IMPRESSION: Cardiomegaly. There are no signs of pulmonary edema or focal pulmonary consolidation. Electronically Signed   By: PElmer PickerM.D.   On: 09/25/2022 17:04  CT ANGIO HEAD NECK W WO CM W PERF (CODE STROKE)  Result Date: 09/25/2022 CLINICAL DATA:  Headache, sudden, severe Subarachnoid hemorrhage [facility]). Left-sided neglect. EXAM: CT ANGIOGRAPHY HEAD AND NECK CT PERFUSION BRAIN TECHNIQUE: Multidetector CT imaging of the head and neck was performed using the standard protocol during bolus administration of intravenous contrast. Multiplanar CT image reconstructions and MIPs were obtained to evaluate the vascular anatomy. Carotid stenosis measurements (when applicable) are obtained utilizing NASCET criteria, using the distal internal carotid diameter as the denominator. Multiphase CT imaging of the brain was performed following IV bolus contrast injection. Subsequent parametric perfusion maps were calculated using RAPID software. RADIATION DOSE REDUCTION: This exam was performed according to the departmental dose-optimization program which includes automated exposure control, adjustment of the mA and/or kV according to patient size and/or use of iterative reconstruction technique. CONTRAST:  119m OMNIPAQUE IOHEXOL 350 MG/ML SOLN COMPARISON:  None Available. FINDINGS: CTA NECK FINDINGS Aortic arch: Standard 3 vessel aortic arch with mild atherosclerotic plaque. Widely patent arch vessel origins. Right carotid system: Patent without evidence of stenosis, dissection, or significant atherosclerosis. Tortuous distal cervical ICA. Left carotid system: Patent without evidence of stenosis, dissection, or  significant atherosclerosis. Tortuous distal cervical ICA. Vertebral arteries: Patent without evidence of stenosis, dissection, or significant atherosclerosis. Codominant. Skeleton: Minor cervical spondylosis. Other neck: Partially calcified 1.7 cm right thyroid nodule; this has been evaluated on previous imaging (thyroid ultrasound and biopsy). No evidence of cervical lymphadenopathy. Upper chest: Clear lung apices. Review of the MIP images confirms the above findings CTA HEAD FINDINGS Anterior circulation: The internal carotid arteries are widely patent from skull base to carotid termini. ACAs and MCAs are patent without evidence of a proximal branch occlusion or significant proximal stenosis. No aneurysm is identified. Posterior circulation: The intracranial vertebral arteries are widely patent to the basilar. Patent PICA and SCA origins are seen bilaterally. The basilar artery is widely patent. There are small bilateral posterior communicating arteries, right larger than left. Both PCAs are patent without evidence of a significant proximal stenosis. No aneurysm is identified. Venous sinuses: Patent. Anatomic variants: None. Review of the MIP images confirms the above findings CT Brain Perfusion Findings: ASPECTS: 10 CBF (<30%) Volume: 0 mL Perfusion (Tmax>6.0s) volume: 0 mL Mismatch Volume: 0 mL Infarction Location: No infarct identified by CTP. These results were communicated to Dr. ARory Percyat 3:57 p.m. on 09/25/2022 by text page via the [facility]. IMPRESSION: 1. No large vessel occlusion or significant stenosis in the head or neck. 2. Negative CTP. 3.  Aortic Atherosclerosis (ICD10-I70.0). Electronically Signed   By: ALogan BoresM.D.   On: 09/25/2022 16:06   CT HEAD CODE STROKE WO CONTRAST  Result Date: 09/25/2022 CLINICAL DATA:  Code stroke. Neuro deficit, acute, stroke suspected. Left-sided neglect. EXAM: CT HEAD WITHOUT CONTRAST TECHNIQUE: Contiguous axial images were obtained from the base  of the skull through the vertex without intravenous contrast. RADIATION DOSE REDUCTION: This exam was performed according to the departmental dose-optimization program which includes automated exposure control, adjustment of the mA and/or kV according to patient size and/or use of iterative reconstruction technique. COMPARISON:  Head CT 08/20/2022 FINDINGS: Brain: There is no evidence of an acute infarct, intracranial hemorrhage, mass, midline shift, or extra-axial fluid collection. The ventricles and sulci are normal. A small cavum septum pellucidum et vergae is noted. 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 Vascular: No hyperdense vessel. Skull: No fracture or suspicious osseous lesion. Sinuses/Orbits: Visualized paranasal sinuses  and mastoid air cells are clear. Unremarkable orbits. Other: None. ASPECTS [facility] Stroke Program Early CT Score) - Ganglionic level infarction (caudate, lentiform nuclei, internal capsule, insula, M1-M3 cortex): 7 - Supraganglionic infarction (M4-M6 cortex): 3 Total score (0-10 with 10 being normal): 10 IMPRESSION: No evidence of acute intracranial abnormality. ASPECTS of 10. These results were communicated to Dr. Rory Percy at 3:47 pm on 09/25/2022 by text page via the [facility] messaging system. Electronically Signed   By: Logan Bores M.D.   On: 09/25/2022 15:47    EKG: I independently viewed the EKG done and my findings are as followed: Sinus rhythm rate of 63.  Nonspecific ST-T changes.  QTc 406.  Assessment/Plan Present on Admission:  AMS (altered mental status)  Principal Problem:   AMS (altered mental status) Acute metabolic encephalopathy, resolving Suspect multifactorial, hyperglycemia, dehydration, versus others CT and MRI brain were non acute UA negative for pyuria, TSH and free T4 within normal limits. Follow EEG Reorient as needed Fall precautions  Dizziness, unclear etiology MRI brain was negative for acute CVA. Orthostatic  vital signs in the morning OT evaluation in the morning Fall precautions  Type 2 diabetes with hyperglycemia Presenting serum glucose greater than 400 IV fluid hydration Hold off home oral hypoglycemics Insulin sliding scale.  Hypokalemia Serum potassium 2.7 Repleted intravenously Repeat BMP in the morning  Hypomagnesemia Serum magnesium 1.6 Repleted intravenously Repeat level in the morning  Mild hypercalcemia Calcium 10.4 IV fluid hydration Repeat chemistry panel in the morning  Recently diagnosed thyroid cancer TSH and free T4 within normal range. Follows with hematology oncology outpatient  Essential hypertension BP is not at goal, elevated Resume home oral antihypertensives.  Hyperlipidemia Resume home Lipitor   DVT prophylaxis: Subcu Lovenox daily  Code Status: Full code  Family Communication: None at bedside  Disposition Plan: Admitted to telemetry medical unit  Consults called: Neurology/stroke team  Admission status: Observation status   Status is: Observation    Kayleen Memos MD [facility] Pager [phone]  If 7PM-7AM, please contact night-coverage [email or web address] Password [email or web address]  09/25/2022, 11:19 PM

## 2022-09-25 NOTE — ED Notes (Signed)
Called to activate a code stroke

## 2022-09-25 NOTE — ED Notes (Signed)
RN asked patient LOC questions and patient responds with shoulder shrugs and "maybe", although took several times to ask, pt was able to tell RN her name and DOB.

## 2022-09-26 ENCOUNTER — Observation Stay (HOSPITAL_COMMUNITY): Payer: Medicare Other

## 2022-09-26 ENCOUNTER — Other Ambulatory Visit: Payer: Self-pay

## 2022-09-26 DIAGNOSIS — Z90710 Acquired absence of both cervix and uterus: Secondary | ICD-10-CM | POA: Diagnosis not present

## 2022-09-26 DIAGNOSIS — C73 Malignant neoplasm of thyroid gland: Secondary | ICD-10-CM | POA: Diagnosis present

## 2022-09-26 DIAGNOSIS — G8929 Other chronic pain: Secondary | ICD-10-CM | POA: Diagnosis present

## 2022-09-26 DIAGNOSIS — R4182 Altered mental status, unspecified: Secondary | ICD-10-CM | POA: Diagnosis not present

## 2022-09-26 DIAGNOSIS — G9341 Metabolic encephalopathy: Secondary | ICD-10-CM

## 2022-09-26 DIAGNOSIS — R339 Retention of urine, unspecified: Secondary | ICD-10-CM | POA: Diagnosis present

## 2022-09-26 DIAGNOSIS — E118 Type 2 diabetes mellitus with unspecified complications: Secondary | ICD-10-CM

## 2022-09-26 DIAGNOSIS — R42 Dizziness and giddiness: Secondary | ICD-10-CM

## 2022-09-26 DIAGNOSIS — R000 Tachycardia, unspecified: Secondary | ICD-10-CM | POA: Diagnosis not present

## 2022-09-26 DIAGNOSIS — M549 Dorsalgia, unspecified: Secondary | ICD-10-CM | POA: Diagnosis present

## 2022-09-26 DIAGNOSIS — E1140 Type 2 diabetes mellitus with diabetic neuropathy, unspecified: Secondary | ICD-10-CM | POA: Diagnosis present

## 2022-09-26 DIAGNOSIS — E876 Hypokalemia: Secondary | ICD-10-CM | POA: Diagnosis present

## 2022-09-26 DIAGNOSIS — E1165 Type 2 diabetes mellitus with hyperglycemia: Secondary | ICD-10-CM | POA: Diagnosis present

## 2022-09-26 DIAGNOSIS — R531 Weakness: Secondary | ICD-10-CM | POA: Diagnosis not present

## 2022-09-26 DIAGNOSIS — G43909 Migraine, unspecified, not intractable, without status migrainosus: Secondary | ICD-10-CM | POA: Diagnosis present

## 2022-09-26 DIAGNOSIS — E1149 Type 2 diabetes mellitus with other diabetic neurological complication: Secondary | ICD-10-CM | POA: Diagnosis not present

## 2022-09-26 DIAGNOSIS — I161 Hypertensive emergency: Secondary | ICD-10-CM | POA: Diagnosis present

## 2022-09-26 DIAGNOSIS — H532 Diplopia: Secondary | ICD-10-CM | POA: Diagnosis present

## 2022-09-26 DIAGNOSIS — E1169 Type 2 diabetes mellitus with other specified complication: Secondary | ICD-10-CM | POA: Diagnosis present

## 2022-09-26 DIAGNOSIS — K219 Gastro-esophageal reflux disease without esophagitis: Secondary | ICD-10-CM | POA: Diagnosis present

## 2022-09-26 DIAGNOSIS — E860 Dehydration: Secondary | ICD-10-CM | POA: Diagnosis present

## 2022-09-26 DIAGNOSIS — Z87891 Personal history of nicotine dependence: Secondary | ICD-10-CM | POA: Diagnosis not present

## 2022-09-26 DIAGNOSIS — J4489 Other specified chronic obstructive pulmonary disease: Secondary | ICD-10-CM | POA: Diagnosis present

## 2022-09-26 DIAGNOSIS — Z8249 Family history of ischemic heart disease and other diseases of the circulatory system: Secondary | ICD-10-CM | POA: Diagnosis not present

## 2022-09-26 DIAGNOSIS — R410 Disorientation, unspecified: Secondary | ICD-10-CM | POA: Diagnosis present

## 2022-09-26 DIAGNOSIS — E785 Hyperlipidemia, unspecified: Secondary | ICD-10-CM | POA: Diagnosis present

## 2022-09-26 DIAGNOSIS — F32A Depression, unspecified: Secondary | ICD-10-CM | POA: Diagnosis present

## 2022-09-26 DIAGNOSIS — I10 Essential (primary) hypertension: Secondary | ICD-10-CM | POA: Diagnosis present

## 2022-09-26 LAB — COMPREHENSIVE METABOLIC PANEL
ALT: 20 U/L (ref 0–44)
AST: 19 U/L (ref 15–41)
Albumin: 3.2 g/dL — ABNORMAL LOW (ref 3.5–5.0)
Alkaline Phosphatase: 85 U/L (ref 38–126)
Anion gap: 8 (ref 5–15)
BUN: 8 mg/dL (ref 6–20)
CO2: 26 mmol/L (ref 22–32)
Calcium: 9.7 mg/dL (ref 8.9–10.3)
Chloride: 109 mmol/L (ref 98–111)
Creatinine, Ser: 0.84 mg/dL (ref 0.44–1.00)
GFR, Estimated: >60 mL/min (ref >60)
Glucose, Bld: 70 mg/dL (ref 70–99)
Potassium: 2.8 mmol/L — ABNORMAL LOW (ref 3.5–5.1)
Sodium: 143 mmol/L (ref 135–145)
Total Bilirubin: 0.4 mg/dL (ref 0.3–1.2)
Total Protein: 5.8 g/dL — ABNORMAL LOW (ref 6.5–8.1)

## 2022-09-26 LAB — CBC
HCT: 36.1 % (ref 36.0–46.0)
Hemoglobin: 11.7 g/dL — ABNORMAL LOW (ref 12.0–15.0)
MCH: 29.8 pg (ref 26.0–34.0)
MCHC: 32.4 g/dL (ref 30.0–36.0)
MCV: 92.1 fL (ref 80.0–100.0)
Platelets: 234 10*3/uL (ref 150–400)
RBC: 3.92 MIL/uL (ref 3.87–5.11)
RDW: 14.1 % (ref 11.5–15.5)
WBC: 7.2 10*3/uL (ref 4.0–10.5)
nRBC: 0 % (ref 0.0–0.2)

## 2022-09-26 LAB — GLUCOSE, CAPILLARY
Glucose-Capillary: 285 mg/dL — ABNORMAL HIGH (ref 70–99)
Glucose-Capillary: 197 mg/dL — ABNORMAL HIGH (ref 70–99)

## 2022-09-26 LAB — CBG MONITORING, ED
Glucose-Capillary: 75 mg/dL (ref 70–99)
Glucose-Capillary: 75 mg/dL (ref 70–99)
Glucose-Capillary: 95 mg/dL (ref 70–99)

## 2022-09-26 LAB — POTASSIUM: Potassium: 4.3 mmol/L (ref 3.5–5.1)

## 2022-09-26 LAB — HEMOGLOBIN A1C
Hgb A1c MFr Bld: 10.7 % — ABNORMAL HIGH (ref 4.8–5.6)
Mean Plasma Glucose: 260.39 mg/dL

## 2022-09-26 LAB — MAGNESIUM: Magnesium: 2.1 mg/dL (ref 1.7–2.4)

## 2022-09-26 LAB — PHOSPHORUS: Phosphorus: 2.6 mg/dL (ref 2.5–4.6)

## 2022-09-26 MED ORDER — POTASSIUM CHLORIDE 20 MEQ PO PACK: 40.0000 meq | PACK | ORAL | Status: DC

## 2022-09-26 MED ORDER — ACETAMINOPHEN 325 MG PO TABS
650.0000 mg | ORAL_TABLET | Freq: Four times a day (QID) | ORAL | Status: DC | PRN
  Administered 2022-09-26 – 2022-09-27 (×2): 650 mg via ORAL
  Filled 2022-09-26 (×2): qty 2

## 2022-09-26 MED ORDER — POTASSIUM CHLORIDE 10 MEQ/100ML IV SOLN
10.0000 meq | INTRAVENOUS | Status: AC
  Administered 2022-09-26: 10 meq via INTRAVENOUS
  Filled 2022-09-26: qty 100

## 2022-09-26 MED ORDER — HYDRALAZINE HCL 20 MG/ML IJ SOLN
5.0000 mg | Freq: Four times a day (QID) | INTRAMUSCULAR | Status: DC | PRN
  Administered 2022-09-26 – 2022-09-29 (×6): 5 mg via INTRAVENOUS
  Filled 2022-09-26 (×8): qty 1

## 2022-09-26 MED ORDER — POTASSIUM CHLORIDE 20 MEQ PO PACK
40.0000 meq | PACK | Freq: Once | ORAL | Status: AC
  Administered 2022-09-26: 40 meq via ORAL
  Filled 2022-09-26: qty 2

## 2022-09-26 MED ORDER — SODIUM CHLORIDE 0.9 % IV SOLN: INTRAVENOUS | Status: DC

## 2022-09-26 MED ORDER — POLYETHYLENE GLYCOL 3350 17 G PO PACK: 17.0000 g | PACK | Freq: Every day | ORAL | Status: DC | PRN

## 2022-09-26 MED ORDER — HYDRALAZINE HCL 20 MG/ML IJ SOLN: 5.0000 mg | Freq: Four times a day (QID) | INTRAMUSCULAR | Status: DC | PRN

## 2022-09-26 MED ORDER — POTASSIUM CHLORIDE 10 MEQ/100ML IV SOLN
INTRAVENOUS | Status: AC
  Administered 2022-09-26: 10 meq via INTRAVENOUS
  Filled 2022-09-26: qty 100

## 2022-09-26 MED ORDER — MELATONIN 5 MG PO TABS
5.0000 mg | ORAL_TABLET | Freq: Every evening | ORAL | Status: DC | PRN
  Administered 2022-09-26 – 2022-09-29 (×4): 5 mg via ORAL
  Filled 2022-09-26 (×4): qty 1

## 2022-09-26 MED ORDER — POTASSIUM CHLORIDE CRYS ER 20 MEQ PO TBCR
40.0000 meq | EXTENDED_RELEASE_TABLET | ORAL | Status: DC
  Administered 2022-09-26: 40 meq via ORAL
  Filled 2022-09-26: qty 2

## 2022-09-26 MED ORDER — NICOTINE 21 MG/24HR TD PT24
21.0000 mg | MEDICATED_PATCH | Freq: Every day | TRANSDERMAL | Status: DC
  Administered 2022-09-26 – 2022-09-30 (×5): 21 mg via TRANSDERMAL
  Filled 2022-09-26 (×5): qty 1

## 2022-09-26 MED ORDER — MECLIZINE HCL 25 MG PO TABS
25.0000 mg | ORAL_TABLET | Freq: Three times a day (TID) | ORAL | Status: DC | PRN
  Administered 2022-09-26 – 2022-09-29 (×6): 25 mg via ORAL
  Filled 2022-09-26 (×8): qty 1

## 2022-09-26 MED ORDER — PROCHLORPERAZINE EDISYLATE 10 MG/2ML IJ SOLN
5.0000 mg | Freq: Four times a day (QID) | INTRAMUSCULAR | Status: DC | PRN
  Administered 2022-09-26 – 2022-09-27 (×2): 5 mg via INTRAVENOUS
  Filled 2022-09-26 (×2): qty 2

## 2022-09-26 NOTE — Progress Notes (Addendum)
MRI negative for stroke. EEG also negative. No further inpatient workup from neurology standpoint at this time. Treat as hypertensive urgency v toxic metabolic encephalopathy and other medical issues per primary team Please call with questions.  -- Amie Portland, MD Neurologist [facility] Pager: [phone]

## 2022-09-26 NOTE — Progress Notes (Signed)
TRH night cross cover note:   I was notified by RN of the patient's request for nicotine patch.  It appears that she smokes 3 packs/day.  I subsequently placed order for scheduled 21 mg nicotine patch Qdaily, with first patch to be applied now.      Babs Bertin, DO Hospitalist

## 2022-09-26 NOTE — Progress Notes (Signed)
PROGRESS NOTE    ADIANNA DARWIN  AJO:[id number] DOB: Jul 13, 1966 DOA: 09/25/2022 PCP: [name], MD     Brief Narrative:  LAQUANDRA CARRILLO is a 56 y.o. female with medical history significant for hypertension, hyperlipidemia, type 2 diabetes, recently diagnosed thyroid cancer followed by hematology oncology at [facility] who presented to [facility] ED due to altered mental status and left-sided weakness.  Last known well was around 11:30 PM.  Upon arrival to the ED, there was concern for possible left sided neglect for which a code stroke was called.  The patient was seen by neurology/stroke team and CVA work up was initiated.  CT head and MRI brain were negative for acute CVA.  New events last 24 hours / Subjective: Patient seen in the emergency department.  Admits to urinary retention, has not been successful in using a pure wick.  Required in and out catheter yesterday.  Also admits to dizziness, room spinning around her.  Complains of some weakness as well.  Assessment & Plan:   Principal Problem:   Acute metabolic encephalopathy Active Problems:   Essential hypertension   Diabetic neuropathy (HCC)   Hypokalemia   Marijuana use   Hyperlipidemia associated with type 2 diabetes mellitus (HCC)   Vertigo   Left-sided weakness   DM (diabetes mellitus), type 2 with complications (HCC)   Thyroid cancer (HCC)  Acute metabolic encephalopathy -Multifactorial in setting of hyperglycemia, dehydration, dizziness -CT head: No evidence of acute intracranial abnormality. -CTA head and neck: No large vessel occlusion or significant stenosis in the head or neck. -MRI brain: No acute intracranial abnormality. -EEG: No seizures or epileptiform discharges were seen throughout the recording. -UA: Unremarkable  -Resolved  Vertigo -MRI brain negative for stroke -Trial meclizine as needed  Left-sided weakness -MRI brain negative for stroke -PT OT  Diabetes mellitus, uncontrolled with hyperglycemia,  with neuropathy -Hemoglobin A1c 10.7 -Gabapentin -Sliding scale insulin  Hypokalemia -Replace, repeat lab this afternoon at 5 PM  Hypertension -Cozaar  Hyperlipidemia -Lipitor  Recently diagnosed thyroid cancer -Follow-up with hematology/oncology outpatient  Incidental 7 mm cyst within the pancreatic tail -Recommend follow up pre and post contrast MRI/MRCP or pancreatic protocol CT in 1 year. This recommendation follows ACR consensus guidelines: Management of Incidental Pancreatic Cysts: A White Paper of the ACR Incidental Findings Committee. [place] 9470;96:283-662.   DVT prophylaxis:  enoxaparin (LOVENOX) injection 40 mg Start: 09/25/22 2330  Code Status: Full code Family Communication: None Disposition Plan:  Status is: Inpatient Remains inpatient appropriate because: Continue potassium replacements, await PT OT evaluation, trial meclizine for vertigo   Antimicrobials:  Anti-infectives (From admission, onward)    None        Objective: Vitals:   09/26/22 0915 09/26/22 1115 09/26/22 1145 09/26/22 1215  BP: (!) 154/116 (!) 174/96 (!) 187/105 (!) 179/115  Pulse: 61 63 65 (!) 59  Resp:      Temp:    98.9 F (37.2 C)  TempSrc:    Oral  SpO2: 100% 99% 100% 100%    Intake/Output Summary (Last 24 hours) at 09/26/2022 1305 Last data filed at 09/26/2022 1039 Gross per 24 hour  Intake 2167.23 ml  Output 1100 ml  Net 1067.23 ml   There were no vitals filed for this visit.  Examination:  General exam: Appears calm and comfortable  Respiratory system: Clear to auscultation. Respiratory effort normal. No respiratory distress. No conversational dyspnea.  Cardiovascular system: S1 & S2 heard, RRR. No murmurs. No pedal edema. Gastrointestinal system:  Abdomen is nondistended, soft and nontender. Normal bowel sounds heard. Central nervous system: Alert and oriented. No focal neurological deficits. Speech clear.  Moves all extremities symmetrically Extremities:  Symmetric in appearance  Skin: No rashes, lesions or ulcers on exposed skin  Psychiatry: Judgement and insight appear normal. Mood & affect appropriate.   Data Reviewed: I have personally reviewed following labs and imaging studies  CBC: Recent Labs  Lab 09/25/22 1530 09/25/22 1536 09/25/22 1537 29-Sep-2022 0103  WBC 5.3  --   --  7.2  NEUTROABS 3.3  --   --   --   HGB 12.1 12.6 12.6 11.7*  HCT 36.8 37.0 37.0 36.1  MCV 93.2  --   --  92.1  PLT 208  --   --  188   Basic Metabolic Panel: Recent Labs  Lab 09/25/22 1530 09/25/22 1536 09/25/22 1537 2022-09-29 0103  NA 139 140 138 143  K 2.7* 2.7* 2.7* 2.8*  CL 103 101  --  109  CO2 26  --   --  26  GLUCOSE 457* 467*  --  70  BUN 12 11  --  8  CREATININE 0.99 0.80  --  0.84  CALCIUM 10.4*  --   --  9.7  MG 1.6*  --   --  2.1  PHOS  --   --   --  2.6   GFR: Estimated Creatinine Clearance: 63.2 mL/min (by C-G formula based on SCr of 0.84 mg/dL). Liver Function Tests: Recent Labs  Lab 09/25/22 1530 Sep 29, 2022 0103  AST 25 19  ALT 20 20  ALKPHOS 112 85  BILITOT 0.3 0.4  PROT 6.3* 5.8*  ALBUMIN 3.3* 3.2*   No results for input(s): "LIPASE", "AMYLASE" in the last 168 hours. No results for input(s): "AMMONIA" in the last 168 hours. Coagulation Profile: Recent Labs  Lab 09/25/22 1530  INR 0.9   Cardiac Enzymes: No results for input(s): "CKTOTAL", "CKMB", "CKMBINDEX", "TROPONINI" in the last 168 hours. BNP (last 3 results) No results for input(s): "PROBNP" in the last 8760 hours. HbA1C: Recent Labs    09/25/22 2353  HGBA1C 10.7*   CBG: Recent Labs  Lab 09/25/22 1530 2022-09-29 0102 Sep 29, 2022 0103 09/29/22 0801  GLUCAP 420* 75 75 95   Lipid Profile: No results for input(s): "CHOL", "HDL", "LDLCALC", "TRIG", "CHOLHDL", "LDLDIRECT" in the last 72 hours. Thyroid Function Tests: Recent Labs    09/25/22 1530  TSH 0.408  FREET4 0.87   Anemia Panel: No results for input(s): "VITAMINB12", "FOLATE", "FERRITIN",  "TIBC", "IRON", "RETICCTPCT" in the last 72 hours. Sepsis Labs: No results for input(s): "PROCALCITON", "LATICACIDVEN" in the last 168 hours.  No results found for this or any previous visit (from the past 240 hour(s)).    Radiology Studies: EEG adult  Result Date: 2022/09/29 Lora Havens, MD     Sep 29, 2022  8:38 AM Patient Name: BERLYNN WARSAME MRN: [id number] Epilepsy Attending: Lora Havens Referring Physician/Provider: Kayleen Memos, DO Date: September 29, 2022 Duration: 25.32 mins Patient history: 56yo F with ams. EEG to evaluate for seizure Level of alertness: Awake, asleep AEDs during EEG study: GBP Technical aspects: This EEG study was done with scalp electrodes positioned according to the 10-20 International system of electrode placement. Electrical activity was reviewed with band pass filter of 1-'70Hz'$ , sensitivity of 7 uV/mm, display speed of 25m/sec with a '60Hz'$  notched filter applied as appropriate. EEG data were recorded continuously and digitally stored.  Video monitoring was available and reviewed as appropriate. Description:  The posterior dominant rhythm consists of 9-10 Hz activity of moderate voltage (25-35 uV) seen predominantly in posterior head regions, symmetric and reactive to eye opening and eye closing. Sleep was characterized by vertex waves, sleep spindles (12 to 14 Hz), maximal frontocentral region. Hyperventilation and photic stimulation were not performed.   IMPRESSION: This study is within normal limits. No seizures or epileptiform discharges were seen throughout the recording. A normal interictal EEG does not exclude the diagnosis of epilepsy. Lora Havens   CT ABDOMEN PELVIS W CONTRAST  Result Date: 09/25/2022 CLINICAL DATA:  Abdominal pain, lethargy, left-sided neglect EXAM: CT ABDOMEN AND PELVIS WITH CONTRAST TECHNIQUE: Multidetector CT imaging of the abdomen and pelvis was performed using the standard protocol following bolus administration of intravenous  contrast. RADIATION DOSE REDUCTION: This exam was performed according to the departmental dose-optimization program which includes automated exposure control, adjustment of the mA and/or kV according to patient size and/or use of iterative reconstruction technique. CONTRAST:  68m OMNIPAQUE IOHEXOL 350 MG/ML SOLN COMPARISON:  06/21/2022 FINDINGS: Lower chest: Scattered hypoventilatory changes at the lung bases. Stable nodular consolidation in the lingula compatible with atelectasis or scarring, unchanged since 2021. Hepatobiliary: Scattered subcentimeter liver hypodensities are unchanged, compatible with multiple cysts. No biliary duct dilation. Gallbladder is unremarkable. Pancreas: There is a 7 mm hypodensity within the tail the pancreas reference image 28/3. The remainder of the pancreas is unremarkable. Spleen: Normal in size without focal abnormality. Adrenals/Urinary Tract: Stable indeterminate 2.9 cm left adrenal mass, previously characterized as adenoma. Right adrenal is unremarkable. The kidneys are stable, with symmetrical enhancement. Excreted contrast within the bladder consistent with previous CT and MRI procedures performed earlier today. No filling defects. Stomach/Bowel: No bowel obstruction or ileus. Scattered colonic diverticulosis without diverticulitis. Normal appendix right lower quadrant. No bowel wall thickening or inflammatory change. Vascular/Lymphatic: No significant vascular findings are present. No enlarged abdominal or pelvic lymph nodes. Reproductive: Status post hysterectomy. No adnexal masses. Pessary in place. Other: No free fluid or free intraperitoneal gas. No abdominal wall hernia. Musculoskeletal: No acute or destructive bony lesions. Reconstructed images demonstrate no additional findings. IMPRESSION: 1. No acute intra-abdominal or intrapelvic process. 2. Distal colonic diverticulosis without diverticulitis. 3. Incidental 7 mm cyst within the pancreatic tail. Recommend follow up  pre and post contrast MRI/MRCP or pancreatic protocol CT in 1 year. This recommendation follows ACR consensus guidelines: Management of Incidental Pancreatic Cysts: A White Paper of the ACR Incidental Findings Committee. J Am Coll Radiol 28185;63:149-702 4. Stable left adrenal mass, previously characterized as adenoma. Electronically Signed   By: MRanda NgoM.D.   On: 09/25/2022 21:48   MR BRAIN WO CONTRAST  Result Date: 09/25/2022 CLINICAL DATA:  Neuro deficit acute, stroke suspected. Left-sided neglect noted. Encephalopathy. Extreme hyperglycemia. Extreme hypertension. EXAM: MRI HEAD WITHOUT CONTRAST TECHNIQUE: Multiplanar, multiecho pulse sequences of the brain and surrounding structures were obtained without intravenous contrast. COMPARISON:  CT head without contrast 09/25/2022. MR head without contrast 04/21/2020 FINDINGS: Brain: Progressive moderate periventricular T2 hyperintensities are present bilaterally. Scattered subcortical T2 hyperintensities have progressed as well. No acute infarct, hemorrhage, or mass lesion is present. The ventricles are of normal size. Mild generalized atrophy is progressed. No significant extraaxial fluid collection is present. White matter changes involving brainstem progressed. Cerebellum is within limits. Vascular: Flow is present in the major intracranial arteries. Skull and upper cervical spine: The craniocervical junction is normal. Upper cervical spine is within normal limits. Marrow signal is unremarkable. Sinuses/Orbits: The paranasal sinuses and mastoid air  cells are clear. The globes and orbits are within normal limits. IMPRESSION: 1. No acute intracranial abnormality. 2. Progressive moderate periventricular and subcortical T2 hyperintensities bilaterally. This most likely reflects the sequelae of microvascular ischemia. Electronically Signed   By: San Morelle M.D.   On: 09/25/2022 20:56   DG CHEST PORT 1 VIEW  Result Date: 09/25/2022 CLINICAL  DATA:  Lethargy, weakness, code stroke EXAM: PORTABLE CHEST 1 VIEW COMPARISON:  08/20/2022 FINDINGS: Transverse diameter of heart is increased. There are no signs of pulmonary edema or focal pulmonary consolidation. There is no pleural effusion or pneumothorax. IMPRESSION: Cardiomegaly. There are no signs of pulmonary edema or focal pulmonary consolidation. Electronically Signed   By: Elmer Picker M.D.   On: 09/25/2022 17:04   CT ANGIO HEAD NECK W WO CM W PERF (CODE STROKE)  Result Date: 09/25/2022 CLINICAL DATA:  Headache, sudden, severe Subarachnoid hemorrhage [place]). Left-sided neglect. EXAM: CT ANGIOGRAPHY HEAD AND NECK CT PERFUSION BRAIN TECHNIQUE: Multidetector CT imaging of the head and neck was performed using the standard protocol during bolus administration of intravenous contrast. Multiplanar CT image reconstructions and MIPs were obtained to evaluate the vascular anatomy. Carotid stenosis measurements (when applicable) are obtained utilizing NASCET criteria, using the distal internal carotid diameter as the denominator. Multiphase CT imaging of the brain was performed following IV bolus contrast injection. Subsequent parametric perfusion maps were calculated using RAPID software. RADIATION DOSE REDUCTION: This exam was performed according to the departmental dose-optimization program which includes automated exposure control, adjustment of the mA and/or kV according to patient size and/or use of iterative reconstruction technique. CONTRAST:  111m OMNIPAQUE IOHEXOL 350 MG/ML SOLN COMPARISON:  None Available. FINDINGS: CTA NECK FINDINGS Aortic arch: Standard 3 vessel aortic arch with mild atherosclerotic plaque. Widely patent arch vessel origins. Right carotid system: Patent without evidence of stenosis, dissection, or significant atherosclerosis. Tortuous distal cervical ICA. Left carotid system: Patent without evidence of stenosis, dissection, or significant atherosclerosis. Tortuous distal  cervical ICA. Vertebral arteries: Patent without evidence of stenosis, dissection, or significant atherosclerosis. Codominant. Skeleton: Minor cervical spondylosis. Other neck: Partially calcified 1.7 cm right thyroid nodule; this has been evaluated on previous imaging (thyroid ultrasound and biopsy). No evidence of cervical lymphadenopathy. Upper chest: Clear lung apices. Review of the MIP images confirms the above findings CTA HEAD FINDINGS Anterior circulation: The internal carotid arteries are widely patent from skull base to carotid termini. ACAs and MCAs are patent without evidence of a proximal branch occlusion or significant proximal stenosis. No aneurysm is identified. Posterior circulation: The intracranial vertebral arteries are widely patent to the basilar. Patent PICA and SCA origins are seen bilaterally. The basilar artery is widely patent. There are small bilateral posterior communicating arteries, right larger than left. Both PCAs are patent without evidence of a significant proximal stenosis. No aneurysm is identified. Venous sinuses: Patent. Anatomic variants: None. Review of the MIP images confirms the above findings CT Brain Perfusion Findings: ASPECTS: 10 CBF (<30%) Volume: 0 mL Perfusion (Tmax>6.0s) volume: 0 mL Mismatch Volume: 0 mL Infarction Location: No infarct identified by CTP. These results were communicated to Dr. ARory Percyat 3:57 p.m. on 09/25/2022 by text page via the [facility]. IMPRESSION: 1. No large vessel occlusion or significant stenosis in the head or neck. 2. Negative CTP. 3.  Aortic Atherosclerosis (ICD10-I70.0). Electronically Signed   By: ALogan BoresM.D.   On: 09/25/2022 16:06   CT HEAD CODE STROKE WO CONTRAST  Result Date: 09/25/2022 CLINICAL DATA:  Code stroke. Neuro  deficit, acute, stroke suspected. Left-sided neglect. EXAM: CT HEAD WITHOUT CONTRAST TECHNIQUE: Contiguous axial images were obtained from the base of the skull through the vertex without  intravenous contrast. RADIATION DOSE REDUCTION: This exam was performed according to the departmental dose-optimization program which includes automated exposure control, adjustment of the mA and/or kV according to patient size and/or use of iterative reconstruction technique. COMPARISON:  Head CT 08/20/2022 FINDINGS: Brain: There is no evidence of an acute infarct, intracranial hemorrhage, mass, midline shift, or extra-axial fluid collection. The ventricles and sulci are normal. A small cavum septum pellucidum et vergae is noted. 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 Vascular: No hyperdense vessel. Skull: No fracture or suspicious osseous lesion. Sinuses/Orbits: Visualized paranasal sinuses and mastoid air cells are clear. Unremarkable orbits. Other: None. ASPECTS [facility] Stroke Program Early CT Score) - Ganglionic level infarction (caudate, lentiform nuclei, internal capsule, insula, M1-M3 cortex): 7 - Supraganglionic infarction (M4-M6 cortex): 3 Total score (0-10 with 10 being normal): 10 IMPRESSION: No evidence of acute intracranial abnormality. ASPECTS of 10. These results were communicated to Dr. Rory Percy at 3:47 pm on 09/25/2022 by text page via the [facility] messaging system. Electronically Signed   By: Logan Bores M.D.   On: 09/25/2022 15:47      Scheduled Meds:  atorvastatin  10 mg Oral Daily   enoxaparin (LOVENOX) injection  40 mg Subcutaneous Q24H   gabapentin  300 mg Oral BID   insulin aspart  0-15 Units Subcutaneous TID WC   insulin aspart  0-5 Units Subcutaneous QHS   losartan  100 mg Oral Daily   mometasone-formoterol  2 puff Inhalation BID   potassium chloride  40 mEq Oral Once   Continuous Infusions:  sodium chloride 50 mL/hr at 09/26/22 1054   potassium chloride 10 mEq (09/26/22 1256)     LOS: 0 days     Dessa Phi, DO [facility] 09/26/2022, 1:05 PM   Available via Epic secure chat 7am-7pm After these hours, please refer to  coverage provider listed on [email or web address]

## 2022-09-26 NOTE — Evaluation (Signed)
Occupational Therapy Evaluation Patient Details Name: [name] MRN: [id number] DOB: 1966-10-14 Today's Date: 09/26/2022   History of Present Illness Pt is a 56 y/o female admitted secondary to AMS. MRI negative. PMH includes HTN, depression, and DM.   Clinical Impression   Patient admitted for the diagnosis above.  PTA she lives with her daughter and granddaughter, but is alone during the day.  Deficits impacting independence are listed below.  Currently she is needing up to Mod A for transfers and lower body ADL from a sit to stand level.  OT will follow in the acute setting, but she does not have the needed 24 hour Mod A to transition directly home.  SNF is recommended for post acute rehab prior to returning home.        Recommendations for follow up therapy are one component of a multi-disciplinary discharge planning process, led by the attending physician.  Recommendations may be updated based on patient status, additional functional criteria and insurance authorization.   Follow Up Recommendations  Skilled nursing-short term rehab (<3 hours/day)    Assistance Recommended at Discharge Frequent or constant Supervision/Assistance  Patient can return home with the following A lot of help with bathing/dressing/bathroom;Help with stairs or ramp for entrance;Assist for transportation;Assistance with cooking/housework;A lot of help with walking and/or transfers    Functional Status Assessment  Patient has had a recent decline in their functional status and demonstrates the ability to make significant improvements in function in a reasonable and predictable amount of time.  Equipment Recommendations  Tub/shower seat    Recommendations for Other Services       Precautions / Restrictions Precautions Precautions: Fall Restrictions Weight Bearing Restrictions: No      Mobility Bed Mobility Overal bed mobility: Needs Assistance                  Transfers                           Balance Overall balance assessment: Needs assistance Sitting-balance support: No upper extremity supported, Feet supported Sitting balance-Leahy Scale: Poor                                     ADL either performed or assessed with clinical judgement   ADL Overall ADL's : Needs assistance/impaired Eating/Feeding: Set up;Bed level   Grooming: Wash/dry hands;Wash/dry face;Min guard;Sitting   Upper Body Bathing: Min guard;Sitting   Lower Body Bathing: Moderate assistance;Sitting/lateral leans   Upper Body Dressing : Minimal assistance;Sitting   Lower Body Dressing: Moderate assistance;Sitting/lateral leans   Toilet Transfer: Moderate assistance;Stand-pivot;BSC/3in1                   Vision Patient Visual Report: Peripheral vision impairment;Blurring of vision Additional Comments: decreased to confrontation both sides, missing 15% from both sides, but patient stating blurred to L eye     Perception Perception Perception: Within Functional Limits   Praxis Praxis Praxis: Intact    Pertinent Vitals/Pain Pain Assessment Pain Assessment: Faces Faces Pain Scale: Hurts a little bit Pain Location: Left upper arm from cuff Pain Descriptors / Indicators: Tender Pain Intervention(s): Monitored during session     Hand Dominance Right   Extremity/Trunk Assessment Upper Extremity Assessment Upper Extremity Assessment: LUE deficits/detail LUE Deficits / Details: able to move against gravity.  General 4/5 MMT LUE Sensation: decreased light touch LUE Coordination:  decreased fine motor;decreased gross motor   Lower Extremity Assessment Lower Extremity Assessment: Defer to PT evaluation   Cervical / Trunk Assessment Cervical / Trunk Assessment: Kyphotic   Communication Communication Communication: No difficulties   Cognition Arousal/Alertness: Awake/alert Behavior During Therapy: WFL for tasks assessed/performed Overall Cognitive  Status: No family/caregiver present to determine baseline cognitive functioning                                 General Comments: following commands well, admits to confusion currenlty.  Different from baseline     General Comments       Exercises     Shoulder Instructions      Home Living Family/patient expects to be discharged to:: Private residence Living Arrangements: Children Available Help at Discharge: Family;Available PRN/intermittently Type of Home: Apartment Home Access: Level entry     Home Layout: One level     Bathroom Shower/Tub: Teacher, early years/pre: Standard Bathroom Accessibility: Yes   Home Equipment: Conservation officer, nature (2 wheels);Rollator (4 wheels);Cane - single point          Prior Functioning/Environment               Mobility Comments: Pt reports independence with use of rollator, however, then reports if she does not have her rollator close by, she gets on the floor and crawls to it. ADLs Comments: No assist with ADL, does not do much home management or meal prep.  Does not drive.        OT Problem List: Decreased strength;Decreased activity tolerance;Impaired balance (sitting and/or standing);Decreased safety awareness      OT Treatment/Interventions: Self-care/ADL training;Therapeutic exercise;Therapeutic activities;Balance training;DME and/or AE instruction    OT Goals(Current goals can be found in the care plan section) Acute Rehab OT Goals Patient Stated Goal: I need to get better OT Goal Formulation: With patient Time For Goal Achievement: 10/10/22 Potential to Achieve Goals: Good ADL Goals Pt Will Perform Grooming: with modified independence;standing Pt Will Perform Lower Body Dressing: with modified independence;sit to/from stand Pt Will Transfer to Toilet: with modified independence;ambulating;regular height toilet Pt/caregiver will Perform Home Exercise Program: Increased strength;Left upper  extremity;With Supervision  OT Frequency: Min 2X/week    Co-evaluation              AM-PAC OT "6 Clicks" Daily Activity     Outcome Measure Help from another person eating meals?: None Help from another person taking care of personal grooming?: A Little Help from another person toileting, which includes using toliet, bedpan, or urinal?: A Lot Help from another person bathing (including washing, rinsing, drying)?: A Lot Help from another person to put on and taking off regular upper body clothing?: A Little Help from another person to put on and taking off regular lower body clothing?: A Lot 6 Click Score: 16   End of Session Nurse Communication: Mobility status  Activity Tolerance: Patient tolerated treatment well Patient left: in bed;with call bell/phone within reach  OT Visit Diagnosis: Unsteadiness on feet (R26.81);Muscle weakness (generalized) (M62.81)                Time: 1355-1411 OT Time Calculation (min): 16 min Charges:  OT General Charges $OT Visit: 1 Visit OT Evaluation $OT Eval Moderate Complexity: 1 Mod  09/26/2022  RP, OTR/L  Acute Rehabilitation Services  Office:  [phone]   Metta Clines 09/26/2022, 2:22 PM

## 2022-09-26 NOTE — Progress Notes (Signed)
EEG complete - results pending 

## 2022-09-26 NOTE — ED Notes (Signed)
Pt requesting in and out cath to urinate. Pt educated on risks associated with catheters including UTI's. Pt verbalizes understanding. Pt assisted to stand to [facility]. Pt c/o dizziness and left leg weakness. Pt voided without difficulty. Pt linens changed and assisted back onto stretcher. Pt on continuous monitoring. Call bell in reach.

## 2022-09-26 NOTE — ED Notes (Signed)
RN attempted to place patient on a bedpan, pt reports not being able to pee yet.

## 2022-09-26 NOTE — Procedures (Signed)
Patient Name: [name]  MRN: [id number]  Epilepsy Attending: Lora Havens  Referring Physician/Provider: Kayleen Memos, DO  Date: 09/26/2022 Duration: 25.32 mins  Patient history: 56yo F with ams. EEG to evaluate for seizure  Level of alertness: Awake, asleep  AEDs during EEG study: GBP  Technical aspects: This EEG study was done with scalp electrodes positioned according to the 10-20 International system of electrode placement. Electrical activity was reviewed with band pass filter of 1-'70Hz'$ , sensitivity of 7 uV/mm, display speed of 33m/sec with a '60Hz'$  notched filter applied as appropriate. EEG data were recorded continuously and digitally stored.  Video monitoring was available and reviewed as appropriate.  Description: The posterior dominant rhythm consists of 9-10 Hz activity of moderate voltage (25-35 uV) seen predominantly in posterior head regions, symmetric and reactive to eye opening and eye closing. Sleep was characterized by vertex waves, sleep spindles (12 to 14 Hz), maximal frontocentral region. Hyperventilation and photic stimulation were not performed.     IMPRESSION: This study is within normal limits. No seizures or epileptiform discharges were seen throughout the recording.  A normal interictal EEG does not exclude the diagnosis of epilepsy.   [name] OBarbra Sarks

## 2022-09-26 NOTE — ED Notes (Signed)
PT assisted to stand to [facility]. Minimal difficulty noted. PT denies dizziness following medications.

## 2022-09-26 NOTE — ED Notes (Signed)
Pt cc of lower abdominal pain and not being able to urinate after the urge. Pt bladder scanned, 800cc scanned. Admitting provider notified.

## 2022-09-26 NOTE — Inpatient Diabetes Management (Signed)
Inpatient Diabetes Program Recommendations  AACE/ADA: New Consensus Statement on Inpatient Glycemic Control (2015)  Target Ranges:  Prepandial:   less than 140 mg/dL      Peak postprandial:   less than 180 mg/dL (1-2 hours)      Critically ill patients:  140 - 180 mg/dL   Lab Results  Component Value Date   GLUCAP 95 09/26/2022   HGBA1C 10.7 (H) 09/25/2022    Review of Glycemic Control  Diabetes history:  DM 2 Outpatient Diabetes medications: Rybelsus 7 mg Daily, Glipizide 5 mg Daily, Metformin 1000 mg bid Current orders for Inpatient glycemic control:  Novolog 0-15 units tid + hs  A1c 10.7% on 10/5  Spoke with pt at bedside regarding A1c level and glucose control at home. Pt reports taking medications all the time and checking her glucose twice a day. She mentioned that her glucose trends have not been controlled at home. Pt sees a PCP for diabetes management and has never seen an Musician.  Spoke with pt about seeing an Endocrinologist outpatient. Also mentioned to pt that she may need insulin at time of d/c. However, glucose tends seem to be controlled on correction scale only. When asked about diet pt reports "eating her salads everyday." However, pt reports drinking the regular green tea drinks. Encouraged pt to drink diet, zero, or light versions of beverages. Told pt to call her doctor every few days for titration of medication instead of waiting for next appointment. Pt has had many stressors lately and was just recently diagnosed with Thyroid cancer.   Thanks,  Tama Headings RN, MSN, BC-ADM Inpatient Diabetes Coordinator Team Pager [phone] (8a-5p)

## 2022-09-26 NOTE — Evaluation (Signed)
Physical Therapy Evaluation Patient Details Name: [name] MRN: [id number] DOB: October 25, 1966 Today's Date: 09/26/2022  History of Present Illness  Pt is a 56 y/o female admitted secondary to AMS. MRI negative. PMH includes HTN, depression, and DM.  Clinical Impression  Pt admitted secondary to problem above with deficits below. Requiring min to mod A for balance during transfers this session. Reporting dizziness throughout and pt reports her legs felt hard to move. Feel pt would benefit from SNF level therapies at d/c. Will continue to follow acutely.        Recommendations for follow up therapy are one component of a multi-disciplinary discharge planning process, led by the attending physician.  Recommendations may be updated based on patient status, additional functional criteria and insurance authorization.  Follow Up Recommendations Skilled nursing-short term rehab (<3 hours/day) Can patient physically be transported by private vehicle: Yes    Assistance Recommended at Discharge Frequent or constant Supervision/Assistance  Patient can return home with the following  A lot of help with walking and/or transfers;A lot of help with bathing/dressing/bathroom;Assistance with cooking/housework;Assist for transportation;Help with stairs or ramp for entrance    Equipment Recommendations None recommended by PT  Recommendations for Other Services       Functional Status Assessment Patient has had a recent decline in their functional status and demonstrates the ability to make significant improvements in function in a reasonable and predictable amount of time.     Precautions / Restrictions Precautions Precautions: Fall Restrictions Weight Bearing Restrictions: No      Mobility  Bed Mobility Overal bed mobility: Needs Assistance Bed Mobility: Sit to Supine       Sit to supine: Min assist   General bed mobility comments: Sitting EOB with RN upon entry. Pt reporting dizziness  and losing balance posteriorly. Required min guard to min A for sitting balance. Min A for LE assist for return to supine.    Transfers Overall transfer level: Needs assistance Equipment used: 1 person hand held assist Transfers: Sit to/from Stand, Bed to chair/wheelchair/BSC Sit to Stand: Min assist   Step pivot transfers: Min assist, Mod assist       General transfer comment: Pt requiring min A to stand from higher stretcher. Upon standing, pt reports her legs wouldn't move. RN came over to assist and pt able to take shuffle steps over to [facility] with min to mod A. Requiring min A to transfer back from [facility] to bed. Shuffle type steps throughout.    Ambulation/Gait                  Stairs            Wheelchair Mobility    Modified Rankin (Stroke Patients Only)       Balance Overall balance assessment: Needs assistance Sitting-balance support: No upper extremity supported, Feet supported Sitting balance-Leahy Scale: Poor Sitting balance - Comments: min guard to min A to maintain balance secondary to posterior LOB and pt reported dizziness   Standing balance support: Single extremity supported, Bilateral upper extremity supported Standing balance-Leahy Scale: Poor Standing balance comment: Reliant on UE and external support                             Pertinent Vitals/Pain Pain Assessment Pain Assessment: No/denies pain    Home Living Family/patient expects to be discharged to:: Private residence Living Arrangements: Children Available Help at Discharge: Family;Available PRN/intermittently Type of Home: [place]  Access: Level entry       Home Layout: One level Home Equipment: Conservation officer, nature (2 wheels);Rollator (4 wheels);Cane - single point      Prior Function Prior Level of Function : Patient poor historian/Family not available             Mobility Comments: Pt reports independence with use of rollator, however, then reports if  she does not have her rollator close by, she gets on the floor and crawls to it.       Hand Dominance        Extremity/Trunk Assessment   Upper Extremity Assessment Upper Extremity Assessment: Defer to OT evaluation    Lower Extremity Assessment Lower Extremity Assessment: Generalized weakness    Cervical / Trunk Assessment Cervical / Trunk Assessment: Kyphotic  Communication   Communication: No difficulties  Cognition Arousal/Alertness: Awake/alert Behavior During Therapy: WFL for tasks assessed/performed Overall Cognitive Status: No family/caregiver present to determine baseline cognitive functioning                                 General Comments: Pt disoriented to time. Conflicting answers throughout. Decreased awareness of safety.        General Comments      Exercises     Assessment/Plan    PT Assessment Patient needs continued PT services  PT Problem List Decreased strength;Decreased balance;Decreased activity tolerance;Decreased mobility;Decreased cognition;Decreased knowledge of use of DME;Decreased safety awareness;Decreased knowledge of precautions       PT Treatment Interventions DME instruction;Gait training;Therapeutic exercise;Functional mobility training;Therapeutic activities;Balance training;Patient/family education    PT Goals (Current goals can be found in the Care Plan section)  Acute Rehab PT Goals Patient Stated Goal: to get stronger PT Goal Formulation: With patient Time For Goal Achievement: 10/10/22 Potential to Achieve Goals: Good    Frequency Min 2X/week     Co-evaluation               AM-PAC PT "6 Clicks" Mobility  Outcome Measure Help needed turning from your back to your side while in a flat bed without using bedrails?: A Little Help needed moving from lying on your back to sitting on the side of a flat bed without using bedrails?: A Little Help needed moving to and from a bed to a chair (including a  wheelchair)?: A Lot Help needed standing up from a chair using your arms (e.g., wheelchair or bedside chair)?: A Little Help needed to walk in hospital room?: A Lot Help needed climbing 3-5 steps with a railing? : Total 6 Click Score: 14    End of Session   Activity Tolerance: Treatment limited secondary to medical complications (Comment) (dizziness) Patient left: in bed;with call bell/phone within reach (on stretcher in ED) Nurse Communication: Mobility status PT Visit Diagnosis: Unsteadiness on feet (R26.81);Muscle weakness (generalized) (M62.81);Difficulty in walking, not elsewhere classified (R26.2)    Time: 7741-2878 PT Time Calculation (min) (ACUTE ONLY): 12 min   Charges:   PT Evaluation $PT Eval Moderate Complexity: 1 Mod          Reuel Derby, PT, DPT  Acute Rehabilitation Services  Office: [phone]   Rudean Hitt 09/26/2022, 1:30 PM

## 2022-09-27 ENCOUNTER — Inpatient Hospital Stay (HOSPITAL_COMMUNITY): Payer: Medicare Other

## 2022-09-27 DIAGNOSIS — G9341 Metabolic encephalopathy: Secondary | ICD-10-CM | POA: Diagnosis not present

## 2022-09-27 LAB — GLUCOSE, CAPILLARY
Glucose-Capillary: 253 mg/dL — ABNORMAL HIGH (ref 70–99)
Glucose-Capillary: 207 mg/dL — ABNORMAL HIGH (ref 70–99)
Glucose-Capillary: 145 mg/dL — ABNORMAL HIGH (ref 70–99)
Glucose-Capillary: 147 mg/dL — ABNORMAL HIGH (ref 70–99)

## 2022-09-27 LAB — BASIC METABOLIC PANEL
Anion gap: 8 (ref 5–15)
BUN: 9 mg/dL (ref 6–20)
CO2: 23 mmol/L (ref 22–32)
Calcium: 9.4 mg/dL (ref 8.9–10.3)
Chloride: 110 mmol/L (ref 98–111)
Creatinine, Ser: 0.84 mg/dL (ref 0.44–1.00)
GFR, Estimated: >60 mL/min (ref >60)
Glucose, Bld: 230 mg/dL — ABNORMAL HIGH (ref 70–99)
Potassium: 3.6 mmol/L (ref 3.5–5.1)
Sodium: 141 mmol/L (ref 135–145)

## 2022-09-27 LAB — HIV ANTIBODY (ROUTINE TESTING W REFLEX): HIV Screen 4th Generation wRfx: NONREACTIVE

## 2022-09-27 MED ORDER — METOPROLOL TARTRATE 50 MG PO TABS
100.0000 mg | ORAL_TABLET | Freq: Every morning | ORAL | Status: DC
  Administered 2022-09-27 – 2022-09-28 (×2): 100 mg via ORAL
  Filled 2022-09-27 (×2): qty 2

## 2022-09-27 MED ORDER — METOPROLOL TARTRATE 50 MG PO TABS
50.0000 mg | ORAL_TABLET | Freq: Every evening | ORAL | Status: DC
  Administered 2022-09-27 – 2022-09-28 (×2): 50 mg via ORAL
  Filled 2022-09-27 (×2): qty 1

## 2022-09-27 MED ORDER — ALBUTEROL SULFATE (2.5 MG/3ML) 0.083% IN NEBU: 2.5000 mg | INHALATION_SOLUTION | RESPIRATORY_TRACT | Status: DC | PRN

## 2022-09-27 MED ORDER — MOMETASONE FURO-FORMOTEROL FUM 200-5 MCG/ACT IN AERO
2.0000 | INHALATION_SPRAY | Freq: Two times a day (BID) | RESPIRATORY_TRACT | Status: DC
  Administered 2022-09-27 – 2022-09-30 (×6): 2 via RESPIRATORY_TRACT
  Filled 2022-09-27: qty 8.8

## 2022-09-27 MED ORDER — SUMATRIPTAN SUCCINATE 25 MG PO TABS
25.0000 mg | ORAL_TABLET | ORAL | Status: DC | PRN
  Administered 2022-09-28 – 2022-09-29 (×5): 25 mg via ORAL
  Filled 2022-09-27 (×6): qty 1

## 2022-09-27 MED ORDER — SERTRALINE HCL 50 MG PO TABS
75.0000 mg | ORAL_TABLET | ORAL | Status: DC
  Administered 2022-09-27 – 2022-09-30 (×4): 75 mg via ORAL
  Filled 2022-09-27 (×4): qty 2

## 2022-09-27 MED ORDER — INSULIN GLARGINE-YFGN 100 UNIT/ML ~~LOC~~ SOLN
10.0000 [IU] | Freq: Every day | SUBCUTANEOUS | Status: DC
  Administered 2022-09-27 – 2022-09-29 (×3): 10 [IU] via SUBCUTANEOUS
  Filled 2022-09-27 (×3): qty 0.1

## 2022-09-27 MED ORDER — CYCLOBENZAPRINE HCL 10 MG PO TABS
5.0000 mg | ORAL_TABLET | Freq: Three times a day (TID) | ORAL | Status: DC | PRN
  Administered 2022-09-27 – 2022-09-29 (×4): 5 mg via ORAL
  Filled 2022-09-27 (×4): qty 1

## 2022-09-27 MED ORDER — METOPROLOL TARTRATE 50 MG PO TABS: 100.0000 mg | ORAL_TABLET | Freq: Two times a day (BID) | ORAL | Status: DC

## 2022-09-27 MED ORDER — ALBUTEROL SULFATE HFA 108 (90 BASE) MCG/ACT IN AERS: 2.0000 | INHALATION_SPRAY | Freq: Four times a day (QID) | RESPIRATORY_TRACT | Status: DC | PRN

## 2022-09-27 NOTE — Significant Event (Signed)
Rapid Response Event Note   Reason for Call :  Double vision and headache  Initial Focused Assessment:  Patient lying in bed in no acute distress. She endorses having double vision and bad headache at the back of her head. Patient not wanting to keep her eyes open due to headache. MD ordered stat head CT to rule out bleed.   BP 157/100 HR 117 RR 28 O2 98    Interventions:  CT non-contrast showed no acute intracranial abnormality  Event Summary:   MD Notified: Dr. Maylene Roes  Call Time: [phone] Arrival Time: 0900 End Time: 0930  Venetia Maxon, RN

## 2022-09-27 NOTE — Progress Notes (Signed)
PROGRESS NOTE    [name]  MGQ:[id number] DOB: 1966/10/14 DOA: 09/25/2022 PCP: [name], MD     Brief Narrative:  [name] is a 56 y.o. female with medical history significant for hypertension, hyperlipidemia, type 2 diabetes, recently diagnosed thyroid cancer followed by hematology oncology at [facility] who presented to [facility] ED due to altered mental status and left-sided weakness.  Last known well was around 11:30 PM.  Upon arrival to the ED, there was concern for possible left sided neglect for which a code stroke was called.  The patient was seen by neurology/stroke team and CVA work up was initiated.  CT head and MRI brain were negative for acute CVA.  New events last 24 hours / Subjective: Rapid response called this morning due to new onset headache and diplopia.  CT head negative for acute abnormality.  Patient was seen and examined.  She admits to pain starting at the occipital region extending toward the top.  Also with double vision and nausea.  She was tachycardic this morning.  She states that she has migraines intermittently, and this feels like a migraine starting up.  Her nausea and headache had slowly been improving since getting medication.  She was resumed on her Lopressor.  Assessment & Plan:   Principal Problem:   Acute metabolic encephalopathy Active Problems:   Essential hypertension   Diabetic neuropathy (HCC)   Hypokalemia   Marijuana use   Hyperlipidemia associated with type 2 diabetes mellitus (HCC)   Vertigo   Left-sided weakness   DM (diabetes mellitus), type 2 with complications (HCC)   Thyroid cancer (HCC)  Acute metabolic encephalopathy -Multifactorial in setting of hyperglycemia, dehydration, dizziness -CT head: No evidence of acute intracranial abnormality. -CTA head and neck: No large vessel occlusion or significant stenosis in the head or neck. -MRI brain: No acute intracranial abnormality. -EEG: No seizures or epileptiform discharges  were seen throughout the recording. -UA: Unremarkable  -Resolved  Migraine -CT head unremarkable -Trial Flexeril, Imitrex  Vertigo -MRI brain negative for stroke -Trial meclizine as needed  Left-sided weakness -MRI brain negative for stroke -PT OT recommending SNF placement  Diabetes mellitus, uncontrolled with hyperglycemia, with neuropathy -Hemoglobin A1c 10.7 -Gabapentin -Add Semglee -Sliding scale insulin  Hypertension -Cozaar, resume home Lopressor  Hyperlipidemia -Lipitor  Recently diagnosed thyroid cancer -Follow-up with hematology/oncology outpatient  Incidental 7 mm cyst within the pancreatic tail -Recommend follow up pre and post contrast MRI/MRCP or pancreatic protocol CT in 1 year. This recommendation follows ACR consensus guidelines: Management of Incidental Pancreatic Cysts: A White Paper of the ACR Incidental Findings Committee. [place] 2671;24:[id number].   DVT prophylaxis:  enoxaparin (LOVENOX) injection 40 mg Start: 09/25/22 2330  Code Status: Full code Family Communication: None Disposition Plan:  Status is: Inpatient Remains inpatient appropriate because: SNF placement is pending   Antimicrobials:  Anti-infectives (From admission, onward)    None        Objective: Vitals:   09/27/22 0600 09/27/22 0748 09/27/22 0805 09/27/22 0844  BP: (!) 157/94  (!) 167/116 (!) 157/100  Pulse:  88 (!) 130   Resp: '20 16 18   '$ Temp:   98 F (36.7 C)   TempSrc:   Oral   SpO2:   98%     Intake/Output Summary (Last 24 hours) at 09/27/2022 1057 Last data filed at 09/27/2022 0729 Gross per 24 hour  Intake 1231.86 ml  Output 640 ml  Net 591.86 ml    There were no vitals  filed for this visit.  Examination:  General exam: Appears calm and comfortable  Respiratory system: Clear to auscultation. Respiratory effort normal. No respiratory distress. No conversational dyspnea.  Cardiovascular system: S1 & S2 heard, RRR. No murmurs. No pedal  edema. Gastrointestinal system: Abdomen is nondistended, soft and nontender. Normal bowel sounds heard. Central nervous system: Alert and oriented.  Weakness in left lower extremity.  Speech is clear and coherent.  Cranial nerves II through XII grossly intact. Left occipital head TTP  Extremities: Symmetric in appearance  Skin: No rashes, lesions or ulcers on exposed skin  Psychiatry: Judgement and insight appear normal. Mood & affect appropriate.   Data Reviewed: I have personally reviewed following labs and imaging studies  CBC: Recent Labs  Lab 09/25/22 1530 09/25/22 1536 09/25/22 1537 09/26/22 0103  WBC 5.3  --   --  7.2  NEUTROABS 3.3  --   --   --   HGB 12.1 12.6 12.6 11.7*  HCT 36.8 37.0 37.0 36.1  MCV 93.2  --   --  92.1  PLT 208  --   --  259    Basic Metabolic Panel: Recent Labs  Lab 09/25/22 1530 09/25/22 1536 09/25/22 1537 09/26/22 0103 09/26/22 1823 09/27/22 0347  NA 139 140 138 143  --  141  K 2.7* 2.7* 2.7* 2.8* 4.3 3.6  CL 103 101  --  109  --  110  CO2 26  --   --  26  --  23  GLUCOSE 457* 467*  --  70  --  230*  BUN 12 11  --  8  --  9  CREATININE 0.99 0.80  --  0.84  --  0.84  CALCIUM 10.4*  --   --  9.7  --  9.4  MG 1.6*  --   --  2.1  --   --   PHOS  --   --   --  2.6  --   --     GFR: Estimated Creatinine Clearance: 63.2 mL/min (by C-G formula based on SCr of 0.84 mg/dL). Liver Function Tests: Recent Labs  Lab 09/25/22 1530 09/26/22 0103  AST 25 19  ALT 20 20  ALKPHOS 112 85  BILITOT 0.3 0.4  PROT 6.3* 5.8*  ALBUMIN 3.3* 3.2*    No results for input(s): "LIPASE", "AMYLASE" in the last 168 hours. No results for input(s): "AMMONIA" in the last 168 hours. Coagulation Profile: Recent Labs  Lab 09/25/22 1530  INR 0.9    Cardiac Enzymes: No results for input(s): "CKTOTAL", "CKMB", "CKMBINDEX", "TROPONINI" in the last 168 hours. BNP (last 3 results) No results for input(s): "PROBNP" in the last 8760 hours. HbA1C: Recent Labs     09/25/22 2353  HGBA1C 10.7*    CBG: Recent Labs  Lab 09/26/22 0103 09/26/22 0801 09/26/22 1825 09/26/22 2207 09/27/22 0639  GLUCAP 75 95 285* 197* 253*    Lipid Profile: No results for input(s): "CHOL", "HDL", "LDLCALC", "TRIG", "CHOLHDL", "LDLDIRECT" in the last 72 hours. Thyroid Function Tests: Recent Labs    09/25/22 1530  TSH 0.408  FREET4 0.87    Anemia Panel: No results for input(s): "VITAMINB12", "FOLATE", "FERRITIN", "TIBC", "IRON", "RETICCTPCT" in the last 72 hours. Sepsis Labs: No results for input(s): "PROCALCITON", "LATICACIDVEN" in the last 168 hours.  No results found for this or any previous visit (from the past 240 hour(s)).    Radiology Studies: CT HEAD WO CONTRAST (5MM)  Result Date: 09/27/2022 CLINICAL DATA:  56 year old  female with new or increasing headache. EXAM: CT HEAD WITHOUT CONTRAST TECHNIQUE: Contiguous axial images were obtained from the base of the skull through the vertex without intravenous contrast. RADIATION DOSE REDUCTION: This exam was performed according to the departmental dose-optimization program which includes automated exposure control, adjustment of the mA and/or kV according to patient size and/or use of iterative reconstruction technique. COMPARISON:  Recent brain MRI 09/25/2022 and earlier. FINDINGS: Brain: Incidental cavum septum pellucidum, normal variant. No midline shift, ventriculomegaly, mass effect, evidence of mass lesion, intracranial hemorrhage or evidence of cortically based acute infarction. Gray-white matter differentiation is within normal limits throughout the brain. Patchy cerebral white matter and pontine abnormality by MRI largely occult by CT. Vascular: No suspicious intracranial vascular hyperdensity. Skull: No acute osseous abnormality identified. Sinuses/Orbits: Visualized paranasal sinuses and mastoids are stable and well aerated. Other: Stable orbit and scalp soft tissues. IMPRESSION: Stable non contrast CT  appearance of the brain. No acute intracranial abnormality. Electronically Signed   By: Genevie Ann M.D.   On: 09/27/2022 09:18   EEG adult  Result Date: 09/26/2022 Lora Havens, MD     09/26/2022  8:38 AM Patient Name: BREANAH FADDIS MRN: [id number] Epilepsy Attending: Lora Havens Referring Physician/Provider: Kayleen Memos, DO Date: 09/26/2022 Duration: 25.32 mins Patient history: 56yo F with ams. EEG to evaluate for seizure Level of alertness: Awake, asleep AEDs during EEG study: GBP Technical aspects: This EEG study was done with scalp electrodes positioned according to the 10-20 International system of electrode placement. Electrical activity was reviewed with band pass filter of 1-'70Hz'$ , sensitivity of 7 uV/mm, display speed of 24m/sec with a '60Hz'$  notched filter applied as appropriate. EEG data were recorded continuously and digitally stored.  Video monitoring was available and reviewed as appropriate. Description: The posterior dominant rhythm consists of 9-10 Hz activity of moderate voltage (25-35 uV) seen predominantly in posterior head regions, symmetric and reactive to eye opening and eye closing. Sleep was characterized by vertex waves, sleep spindles (12 to 14 Hz), maximal frontocentral region. Hyperventilation and photic stimulation were not performed.   IMPRESSION: This study is within normal limits. No seizures or epileptiform discharges were seen throughout the recording. A normal interictal EEG does not exclude the diagnosis of epilepsy. PLora Havens  CT ABDOMEN PELVIS W CONTRAST  Result Date: 09/25/2022 CLINICAL DATA:  Abdominal pain, lethargy, left-sided neglect EXAM: CT ABDOMEN AND PELVIS WITH CONTRAST TECHNIQUE: Multidetector CT imaging of the abdomen and pelvis was performed using the standard protocol following bolus administration of intravenous contrast. RADIATION DOSE REDUCTION: This exam was performed according to the departmental dose-optimization program which includes  automated exposure control, adjustment of the mA and/or kV according to patient size and/or use of iterative reconstruction technique. CONTRAST:  653mOMNIPAQUE IOHEXOL 350 MG/ML SOLN COMPARISON:  06/21/2022 FINDINGS: Lower chest: Scattered hypoventilatory changes at the lung bases. Stable nodular consolidation in the lingula compatible with atelectasis or scarring, unchanged since 2021. Hepatobiliary: Scattered subcentimeter liver hypodensities are unchanged, compatible with multiple cysts. No biliary duct dilation. Gallbladder is unremarkable. Pancreas: There is a 7 mm hypodensity within the tail the pancreas reference image 28/3. The remainder of the pancreas is unremarkable. Spleen: Normal in size without focal abnormality. Adrenals/Urinary Tract: Stable indeterminate 2.9 cm left adrenal mass, previously characterized as adenoma. Right adrenal is unremarkable. The kidneys are stable, with symmetrical enhancement. Excreted contrast within the bladder consistent with previous CT and MRI procedures performed earlier today. No filling defects. Stomach/Bowel: No bowel  obstruction or ileus. Scattered colonic diverticulosis without diverticulitis. Normal appendix right lower quadrant. No bowel wall thickening or inflammatory change. Vascular/Lymphatic: No significant vascular findings are present. No enlarged abdominal or pelvic lymph nodes. Reproductive: Status post hysterectomy. No adnexal masses. Pessary in place. Other: No free fluid or free intraperitoneal gas. No abdominal wall hernia. Musculoskeletal: No acute or destructive bony lesions. Reconstructed images demonstrate no additional findings. IMPRESSION: 1. No acute intra-abdominal or intrapelvic process. 2. Distal colonic diverticulosis without diverticulitis. 3. Incidental 7 mm cyst within the pancreatic tail. Recommend follow up pre and post contrast MRI/MRCP or pancreatic protocol CT in 1 year. This recommendation follows ACR consensus guidelines:  Management of Incidental Pancreatic Cysts: A White Paper of the ACR Incidental Findings Committee. J Am Coll Radiol 2505;39:767-341. 4. Stable left adrenal mass, previously characterized as adenoma. Electronically Signed   By: Randa Ngo M.D.   On: 09/25/2022 21:48   MR BRAIN WO CONTRAST  Result Date: 09/25/2022 CLINICAL DATA:  Neuro deficit acute, stroke suspected. Left-sided neglect noted. Encephalopathy. Extreme hyperglycemia. Extreme hypertension. EXAM: MRI HEAD WITHOUT CONTRAST TECHNIQUE: Multiplanar, multiecho pulse sequences of the brain and surrounding structures were obtained without intravenous contrast. COMPARISON:  CT head without contrast 09/25/2022. MR head without contrast 04/21/2020 FINDINGS: Brain: Progressive moderate periventricular T2 hyperintensities are present bilaterally. Scattered subcortical T2 hyperintensities have progressed as well. No acute infarct, hemorrhage, or mass lesion is present. The ventricles are of normal size. Mild generalized atrophy is progressed. No significant extraaxial fluid collection is present. White matter changes involving brainstem progressed. Cerebellum is within limits. Vascular: Flow is present in the major intracranial arteries. Skull and upper cervical spine: The craniocervical junction is normal. Upper cervical spine is within normal limits. Marrow signal is unremarkable. Sinuses/Orbits: The paranasal sinuses and mastoid air cells are clear. The globes and orbits are within normal limits. IMPRESSION: 1. No acute intracranial abnormality. 2. Progressive moderate periventricular and subcortical T2 hyperintensities bilaterally. This most likely reflects the sequelae of microvascular ischemia. Electronically Signed   By: San Morelle M.D.   On: 09/25/2022 20:56   DG CHEST PORT 1 VIEW  Result Date: 09/25/2022 CLINICAL DATA:  Lethargy, weakness, code stroke EXAM: PORTABLE CHEST 1 VIEW COMPARISON:  08/20/2022 FINDINGS: Transverse diameter of  heart is increased. There are no signs of pulmonary edema or focal pulmonary consolidation. There is no pleural effusion or pneumothorax. IMPRESSION: Cardiomegaly. There are no signs of pulmonary edema or focal pulmonary consolidation. Electronically Signed   By: Elmer Picker M.D.   On: 09/25/2022 17:04   CT ANGIO HEAD NECK W WO CM W PERF (CODE STROKE)  Result Date: 09/25/2022 CLINICAL DATA:  Headache, sudden, severe Subarachnoid hemorrhage [facility] Left-sided neglect. EXAM: CT ANGIOGRAPHY HEAD AND NECK CT PERFUSION BRAIN TECHNIQUE: Multidetector CT imaging of the head and neck was performed using the standard protocol during bolus administration of intravenous contrast. Multiplanar CT image reconstructions and MIPs were obtained to evaluate the vascular anatomy. Carotid stenosis measurements (when applicable) are obtained utilizing NASCET criteria, using the distal internal carotid diameter as the denominator. Multiphase CT imaging of the brain was performed following IV bolus contrast injection. Subsequent parametric perfusion maps were calculated using RAPID software. RADIATION DOSE REDUCTION: This exam was performed according to the departmental dose-optimization program which includes automated exposure control, adjustment of the mA and/or kV according to patient size and/or use of iterative reconstruction technique. CONTRAST:  177m OMNIPAQUE IOHEXOL 350 MG/ML SOLN COMPARISON:  None Available. FINDINGS: CTA NECK FINDINGS Aortic arch: Standard  3 vessel aortic arch with mild atherosclerotic plaque. Widely patent arch vessel origins. Right carotid system: Patent without evidence of stenosis, dissection, or significant atherosclerosis. Tortuous distal cervical ICA. Left carotid system: Patent without evidence of stenosis, dissection, or significant atherosclerosis. Tortuous distal cervical ICA. Vertebral arteries: Patent without evidence of stenosis, dissection, or significant atherosclerosis. Codominant.  Skeleton: Minor cervical spondylosis. Other neck: Partially calcified 1.7 cm right thyroid nodule; this has been evaluated on previous imaging (thyroid ultrasound and biopsy). No evidence of cervical lymphadenopathy. Upper chest: Clear lung apices. Review of the MIP images confirms the above findings CTA HEAD FINDINGS Anterior circulation: The internal carotid arteries are widely patent from skull base to carotid termini. ACAs and MCAs are patent without evidence of a proximal branch occlusion or significant proximal stenosis. No aneurysm is identified. Posterior circulation: The intracranial vertebral arteries are widely patent to the basilar. Patent PICA and SCA origins are seen bilaterally. The basilar artery is widely patent. There are small bilateral posterior communicating arteries, right larger than left. Both PCAs are patent without evidence of a significant proximal stenosis. No aneurysm is identified. Venous sinuses: Patent. Anatomic variants: None. Review of the MIP images confirms the above findings CT Brain Perfusion Findings: ASPECTS: 10 CBF (<30%) Volume: 0 mL Perfusion (Tmax>6.0s) volume: 0 mL Mismatch Volume: 0 mL Infarction Location: No infarct identified by CTP. These results were communicated to Dr. Rory Percy at 3:57 p.m. on 09/25/2022 by text page via the [facility] messaging system. IMPRESSION: 1. No large vessel occlusion or significant stenosis in the head or neck. 2. Negative CTP. 3.  Aortic Atherosclerosis (ICD10-I70.0). Electronically Signed   By: Logan Bores M.D.   On: 09/25/2022 16:06   CT HEAD CODE STROKE WO CONTRAST  Result Date: 09/25/2022 CLINICAL DATA:  Code stroke. Neuro deficit, acute, stroke suspected. Left-sided neglect. EXAM: CT HEAD WITHOUT CONTRAST TECHNIQUE: Contiguous axial images were obtained from the base of the skull through the vertex without intravenous contrast. RADIATION DOSE REDUCTION: This exam was performed according to the departmental dose-optimization program which  includes automated exposure control, adjustment of the mA and/or kV according to patient size and/or use of iterative reconstruction technique. COMPARISON:  Head CT 08/20/2022 FINDINGS: Brain: There is no evidence of an acute infarct, intracranial hemorrhage, mass, midline shift, or extra-axial fluid collection. The ventricles and sulci are normal. A small cavum septum pellucidum et vergae is noted. 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 Vascular: No hyperdense vessel. Skull: No fracture or suspicious osseous lesion. Sinuses/Orbits: Visualized paranasal sinuses and mastoid air cells are clear. Unremarkable orbits. Other: None. ASPECTS [facility] Stroke Program Early CT Score) - Ganglionic level infarction (caudate, lentiform nuclei, internal capsule, insula, M1-M3 cortex): 7 - Supraganglionic infarction (M4-M6 cortex): 3 Total score (0-10 with 10 being normal): 10 IMPRESSION: No evidence of acute intracranial abnormality. ASPECTS of 10. These results were communicated to Dr. Rory Percy at 3:47 pm on 09/25/2022 by text page via the [facility] messaging system. Electronically Signed   By: Logan Bores M.D.   On: 09/25/2022 15:47      Scheduled Meds:  atorvastatin  10 mg Oral Daily   enoxaparin (LOVENOX) injection  40 mg Subcutaneous Q24H   gabapentin  300 mg Oral BID   insulin aspart  0-15 Units Subcutaneous TID WC   insulin aspart  0-5 Units Subcutaneous QHS   losartan  100 mg Oral Daily   metoprolol tartrate  100 mg Oral q morning   And   metoprolol  tartrate  50 mg Oral QPM   mometasone-formoterol  2 puff Inhalation BID   nicotine  21 mg Transdermal Daily   sertraline  75 mg Oral [id number]   Continuous Infusions:     LOS: 1 day     Dessa Phi, DO [facility] 09/27/2022, 10:57 AM   Available via Epic secure chat 7am-7pm After these hours, please refer to coverage provider listed on [email or web address]

## 2022-09-28 DIAGNOSIS — R531 Weakness: Secondary | ICD-10-CM

## 2022-09-28 DIAGNOSIS — G9341 Metabolic encephalopathy: Secondary | ICD-10-CM | POA: Diagnosis not present

## 2022-09-28 DIAGNOSIS — I10 Essential (primary) hypertension: Secondary | ICD-10-CM | POA: Diagnosis not present

## 2022-09-28 DIAGNOSIS — E1149 Type 2 diabetes mellitus with other diabetic neurological complication: Secondary | ICD-10-CM | POA: Diagnosis not present

## 2022-09-28 DIAGNOSIS — R42 Dizziness and giddiness: Secondary | ICD-10-CM

## 2022-09-28 DIAGNOSIS — E118 Type 2 diabetes mellitus with unspecified complications: Secondary | ICD-10-CM | POA: Diagnosis not present

## 2022-09-28 LAB — BASIC METABOLIC PANEL
Anion gap: 11 (ref 5–15)
BUN: 11 mg/dL (ref 6–20)
CO2: 24 mmol/L (ref 22–32)
Calcium: 10.4 mg/dL — ABNORMAL HIGH (ref 8.9–10.3)
Chloride: 105 mmol/L (ref 98–111)
Creatinine, Ser: 0.84 mg/dL (ref 0.44–1.00)
GFR, Estimated: >60 mL/min (ref >60)
Glucose, Bld: 118 mg/dL — ABNORMAL HIGH (ref 70–99)
Potassium: 3.5 mmol/L (ref 3.5–5.1)
Sodium: 140 mmol/L (ref 135–145)

## 2022-09-28 LAB — GLUCOSE, CAPILLARY
Glucose-Capillary: 182 mg/dL — ABNORMAL HIGH (ref 70–99)
Glucose-Capillary: 270 mg/dL — ABNORMAL HIGH (ref 70–99)
Glucose-Capillary: 195 mg/dL — ABNORMAL HIGH (ref 70–99)
Glucose-Capillary: 186 mg/dL — ABNORMAL HIGH (ref 70–99)

## 2022-09-28 NOTE — Progress Notes (Signed)
PROGRESS NOTE    [name]  ZOX:[id number] DOB: 1966/04/11 DOA: 09/25/2022 PCP: [name], MD   Brief Narrative:  56 y.o. female with medical history significant for hypertension, hyperlipidemia, type 2 diabetes, recently diagnosed thyroid cancer followed by hematology oncology at [facility] who presented to [facility] ED due to altered mental status and left-sided weakness.  Last known well was around 11:30 PM.  Upon arrival to the ED, there was concern for possible left sided neglect for which a code stroke was called.  The patient was seen by neurology/stroke team and CVA work up was initiated.  CT head and MRI brain were negative for acute CVA.  PT has recommended SNF placement.  Currently medically stable for discharge to SNF.  Assessment & Plan:   Acute metabolic encephalopathy -Multifactorial in setting of hyperglycemia, dehydration, dizziness -CT head: No evidence of acute intracranial abnormality. -CTA head and neck: No large vessel occlusion or significant stenosis in the head or neck. -MRI brain: No acute intracranial abnormality. -EEG: No seizures or epileptiform discharges were seen throughout the recording. -UA: Unremarkable  -Resolved.  Monitor mental status.   Migraine -CT head unremarkable -Continue trial of Flexeril, Imitrex as needed   Vertigo -MRI brain negative for stroke -Continue meclizine as needed   Left-sided weakness -MRI brain negative for stroke -PT OT recommending SNF placement   Diabetes mellitus, uncontrolled with hyperglycemia, with neuropathy -Hemoglobin A1c 10.7 -Continue gabapentin -Continue long-acting and sliding scale insulin   Hypertension -Continue Cozaar and Lopressor.  Blood pressure intermittently on the higher side.  Hyperlipidemia -Continue Lipitor   Recently diagnosed thyroid cancer -Follow-up with hematology/oncology outpatient   Incidental 7 mm cyst within the pancreatic tail -Recommend follow up pre and post contrast  MRI/MRCP or pancreatic protocol CT in 1 year. This recommendation follows ACR consensus guidelines: Management of Incidental Pancreatic Cysts: A White Paper of the ACR Incidental Findings Committee. [place] 8119;14:[id number].     DVT prophylaxis:  enoxaparin (LOVENOX) injection 40 mg Start: 09/25/22 2330   Code Status: Full code Family Communication: None at bedside Disposition Plan: Status is: Inpatient Remains inpatient appropriate because: Of need for SNF placement    Consultants: Neurology  Procedures: Echo  Antimicrobials: None   Subjective: Patient seen and examined at bedside.  No fever, vomiting, chest pain or worsening shortness of breath reported.  Objective: Vitals:   09/28/22 0000 09/28/22 0400 09/28/22 0745 09/28/22 0844  BP: (!) 148/88 (!) 161/118  (!) 154/95  Pulse: 82 86  (!) 106  Resp: 18 18    Temp: 98.7 F (37.1 C) 98.7 F (37.1 C)  98.3 F (36.8 C)  TempSrc: Oral Oral  Oral  SpO2: 98% 99% 99% 100%    Intake/Output Summary (Last 24 hours) at 09/28/2022 1012 Last data filed at 09/27/2022 1416 Gross per 24 hour  Intake 30.92 ml  Output --  Net 30.92 ml   There were no vitals filed for this visit.  Examination:  General: On room air.  No distress.  respiratory: Decreased breath sounds at bases bilaterally with some crackles CVS: Currently rate controlled; S1-S2 heard  abdominal: Soft, nontender, slightly distended, no organomegaly; normal bowel sounds are heard  extremities: Trace lower extremity edema; no clubbing.       Data Reviewed: I have personally reviewed following labs and imaging studies  CBC: Recent Labs  Lab 09/25/22 1530 09/25/22 1536 09/25/22 1537 09/26/22 0103  WBC 5.3  --   --  7.2  NEUTROABS 3.3  --   --   --  HGB 12.1 12.6 12.6 11.7*  HCT 36.8 37.0 37.0 36.1  MCV 93.2  --   --  92.1  PLT 208  --   --  161   Basic Metabolic Panel: Recent Labs  Lab 09/25/22 1530 09/25/22 1536 09/25/22 1537  09/26/22 0103 09/26/22 1823 09/27/22 0347 09/28/22 0712  NA 139 140 138 143  --  141 140  K 2.7* 2.7* 2.7* 2.8* 4.3 3.6 3.5  CL 103 101  --  109  --  110 105  CO2 26  --   --  26  --  23 24  GLUCOSE 457* 467*  --  70  --  230* 118*  BUN 12 11  --  8  --  9 11  CREATININE 0.99 0.80  --  0.84  --  0.84 0.84  CALCIUM 10.4*  --   --  9.7  --  9.4 10.4*  MG 1.6*  --   --  2.1  --   --   --   PHOS  --   --   --  2.6  --   --   --    GFR: Estimated Creatinine Clearance: 63.2 mL/min (by C-G formula based on SCr of 0.84 mg/dL). Liver Function Tests: Recent Labs  Lab 09/25/22 1530 09/26/22 0103  AST 25 19  ALT 20 20  ALKPHOS 112 85  BILITOT 0.3 0.4  PROT 6.3* 5.8*  ALBUMIN 3.3* 3.2*   No results for input(s): "LIPASE", "AMYLASE" in the last 168 hours. No results for input(s): "AMMONIA" in the last 168 hours. Coagulation Profile: Recent Labs  Lab 09/25/22 1530  INR 0.9   Cardiac Enzymes: No results for input(s): "CKTOTAL", "CKMB", "CKMBINDEX", "TROPONINI" in the last 168 hours. BNP (last 3 results) No results for input(s): "PROBNP" in the last 8760 hours. HbA1C: Recent Labs    09/25/22 2353  HGBA1C 10.7*   CBG: Recent Labs  Lab 09/27/22 0639 09/27/22 1138 09/27/22 1618 09/27/22 2142 09/28/22 0550  GLUCAP 253* 207* 145* 147* 182*   Lipid Profile: No results for input(s): "CHOL", "HDL", "LDLCALC", "TRIG", "CHOLHDL", "LDLDIRECT" in the last 72 hours. Thyroid Function Tests: Recent Labs    09/25/22 1530  TSH 0.408  FREET4 0.87   Anemia Panel: No results for input(s): "VITAMINB12", "FOLATE", "FERRITIN", "TIBC", "IRON", "RETICCTPCT" in the last 72 hours. Sepsis Labs: No results for input(s): "PROCALCITON", "LATICACIDVEN" in the last 168 hours.  No results found for this or any previous visit (from the past 240 hour(s)).       Radiology Studies: CT HEAD WO CONTRAST (5MM)  Result Date: 09/27/2022 CLINICAL DATA:  56 year old female with new or increasing  headache. EXAM: CT HEAD WITHOUT CONTRAST TECHNIQUE: Contiguous axial images were obtained from the base of the skull through the vertex without intravenous contrast. RADIATION DOSE REDUCTION: This exam was performed according to the departmental dose-optimization program which includes automated exposure control, adjustment of the mA and/or kV according to patient size and/or use of iterative reconstruction technique. COMPARISON:  Recent brain MRI 09/25/2022 and earlier. FINDINGS: Brain: Incidental cavum septum pellucidum, normal variant. No midline shift, ventriculomegaly, mass effect, evidence of mass lesion, intracranial hemorrhage or evidence of cortically based acute infarction. Gray-white matter differentiation is within normal limits throughout the brain. Patchy cerebral white matter and pontine abnormality by MRI largely occult by CT. Vascular: No suspicious intracranial vascular hyperdensity. Skull: No acute osseous abnormality identified. Sinuses/Orbits: Visualized paranasal sinuses and mastoids are stable and well aerated. Other: Stable orbit  and scalp soft tissues. IMPRESSION: Stable non contrast CT appearance of the brain. No acute intracranial abnormality. Electronically Signed   By: Genevie Ann M.D.   On: 09/27/2022 09:18        Scheduled Meds:  atorvastatin  10 mg Oral Daily   enoxaparin (LOVENOX) injection  40 mg Subcutaneous Q24H   gabapentin  300 mg Oral BID   insulin aspart  0-15 Units Subcutaneous TID WC   insulin aspart  0-5 Units Subcutaneous QHS   insulin glargine-yfgn  10 Units Subcutaneous Daily   losartan  100 mg Oral Daily   metoprolol tartrate  100 mg Oral q morning   And   metoprolol tartrate  50 mg Oral QPM   mometasone-formoterol  2 puff Inhalation BID   nicotine  21 mg Transdermal Daily   sertraline  75 mg Oral BH-q7a   Continuous Infusions:        Aline August, MD [facility] 09/28/2022, 10:12 AM

## 2022-09-28 NOTE — NC FL2 (Signed)
[place] LEVEL OF CARE SCREENING TOOL     IDENTIFICATION  Patient Name: [name] Birthdate: 1966-10-31 Sex: female Admission Date (Current Location): 09/25/2022  [facility] and [place] Number:  Herbalist and Address:  [facility] [place], [place] [address], [place], [place] [address]      Provider Number: [id number]  Attending Physician Name and Address:  Aline August, MD  Relative Name and Phone Number:       Current Level of Care: Hospital Recommended Level of Care: [place] Prior Approval Number:    Date Approved/Denied:   PASRR Number: [id number]  Discharge Plan: SNF    Current Diagnoses: Patient Active Problem List   Diagnosis Date Noted   Vertigo 09/26/2022   Left-sided weakness 09/26/2022   DM (diabetes mellitus), type 2 with complications ([place]) 17/79/3903   Thyroid cancer ([place]) 00/92/3300   Acute metabolic encephalopathy 76/22/6333   Lumbar strain 08/13/2022   Lipoma of left upper extremity    Exercise intolerance 12/26/2021   Heart murmur 12/26/2021   DOE (dyspnea on exertion) 12/26/2021   Palpitations 12/26/2021   Abdominal bloating    Polyp of colon    Multiple adenomatous polyps    Depression    Hyperlipidemia associated with type 2 diabetes mellitus ([place])    GERD (gastroesophageal reflux disease)    Influenza A 01/26/2019   Hypokalemia 06/12/2017   Marijuana use 06/12/2017   Diabetic neuropathy ([place]) 01/07/2016   Plantar fasciitis of left foot 11/26/2015   Back pain 10/10/2015   Asthma 09/29/2015   Essential hypertension 09/29/2015   DM (diabetes mellitus) type II controlled, neurological manifestation ([place]) 09/29/2015    Orientation RESPIRATION BLADDER Height & Weight     Self, Time, Situation, Place  Normal Continent Weight:   Height:     BEHAVIORAL SYMPTOMS/MOOD NEUROLOGICAL BOWEL NUTRITION STATUS      Continent    AMBULATORY STATUS COMMUNICATION OF NEEDS Skin   Limited  Assist Verbally Normal                       Personal Care Assistance Level of Assistance  Bathing, Feeding, Dressing Bathing Assistance: Limited assistance Feeding assistance: Independent Dressing Assistance: Limited assistance     Functional Limitations Info  Sight, Hearing, Speech Sight Info: Adequate Hearing Info: Adequate Speech Info: Adequate    SPECIAL CARE FACTORS FREQUENCY  PT (By licensed PT), OT (By licensed OT)                    Contractures Contractures Info: Not present    Additional Factors Info                  Current Medications (09/28/2022):  This is the current hospital active medication list Current Facility-Administered Medications  Medication Dose Route Frequency Provider Last Rate Last Admin   acetaminophen (TYLENOL) tablet 650 mg  650 mg Oral Q6H PRN Irene Pap N, DO   650 mg at 09/27/22 0844   albuterol (PROVENTIL) (2.5 MG/3ML) 0.083% nebulizer solution 2.5 mg  2.5 mg Nebulization Q2H PRN Dessa Phi, DO       atorvastatin (LIPITOR) tablet 10 mg  10 mg Oral Daily [place], Carole N, DO   10 mg at 09/27/22 0845   cyclobenzaprine (FLEXERIL) tablet 5 mg  5 mg Oral TID PRN Dessa Phi, DO   5 mg at 09/28/22 0842   enoxaparin (LOVENOX) injection 40 mg  40 mg Subcutaneous Q24H  Irene Pap N, DO   40 mg at 09/27/22 2241   gabapentin (NEURONTIN) tablet 300 mg  300 mg Oral BID Kayleen Memos, DO   300 mg at 09/27/22 2241   hydrALAZINE (APRESOLINE) injection 5 mg  5 mg Intravenous Q6H PRN Irene Pap N, DO   5 mg at 09/28/22 0440   insulin aspart (novoLOG) injection 0-15 Units  0-15 Units Subcutaneous TID WC Irene Pap N, DO   3 Units at 09/28/22 0600   insulin aspart (novoLOG) injection 0-5 Units  0-5 Units Subcutaneous QHS Hall, Carole N, DO       insulin glargine-yfgn (SEMGLEE) injection 10 Units  10 Units Subcutaneous Daily Dessa Phi, DO   10 Units at 09/28/22 0848   losartan (COZAAR) tablet 100 mg  100 mg Oral Daily Irene Pap N, DO   100 mg at 09/27/22 3710   meclizine (ANTIVERT) tablet 25 mg  25 mg Oral TID PRN Dessa Phi, DO   25 mg at 09/28/22 0847   melatonin tablet 5 mg  5 mg Oral QHS PRN Irene Pap N, DO   5 mg at 09/27/22 2359   metoprolol tartrate (LOPRESSOR) tablet 100 mg  100 mg Oral q morning Dessa Phi, DO   100 mg at 09/27/22 1057   And   metoprolol tartrate (LOPRESSOR) tablet 50 mg  50 mg Oral QPM Dessa Phi, DO   50 mg at 09/27/22 1807   mometasone-formoterol (DULERA) 200-5 MCG/ACT inhaler 2 puff  2 puff Inhalation BID Dessa Phi, DO   2 puff at 09/28/22 0745   nicotine (NICODERM CQ - dosed in mg/24 hours) patch 21 mg  21 mg Transdermal Daily [place]ower[place], Justin B, DO   21 mg at 09/27/22 1100   polyethylene glycol (MIRALAX / GLYCOLAX) packet 17 g  17 g Oral Daily PRN Irene Pap N, DO       prochlorperazine (COMPAZINE) injection 5 mg  5 mg Intravenous Q6H PRN Irene Pap N, DO   5 mg at 09/27/22 0845   sertraline (ZOLOFT) tablet 75 mg  75 mg Oral Ival Bible, Anderson Malta, DO   75 mg at 09/28/22 0841   SUMAtriptan (IMITREX) tablet 25 mg  25 mg Oral Q2H PRN Dessa Phi, DO   25 mg at 09/28/22 6269     Discharge Medications: Please see discharge summary for a list of discharge medications.  Relevant Imaging Results:  Relevant Lab Results:   Additional Information SS#[id number]  [name], [place]

## 2022-09-28 NOTE — TOC Initial Note (Signed)
Transition of Care [facility]) - Initial/Assessment Note    Patient Details  Name: [name] MRN: [id number] Date of Birth: [birth year]  Transition of Care [facility]) CM/SW Contact:    [name], [place] Phone Number: 09/28/2022, 10:38 AM  Clinical Narrative:  SW spoke with pt re PT/OT recommendation for SNF. Pt from home alone, reports she has family who assists when able. Reviewed SNF placement process and answered questions. Pt reports agreeable to SNF for STR, no facility preference noted. Will f/u with offers as available.   Wandra Feinstein, MSW, LCSW [phone] (coverage)                    Expected Discharge Plan: Skilled Nursing Facility Barriers to Discharge: SNF Pending bed offer   Patient Goals and CMS Choice   CMS Medicare.gov Compare Post Acute Care list provided to:: Patient Choice offered to / list presented to : Patient  Expected Discharge Plan and Services Expected Discharge Plan: [place]       Living arrangements for the past 2 months: Apartment                                      Prior Living Arrangements/Services Living arrangements for the past 2 months: Apartment Lives with:: Self Patient language and need for interpreter reviewed:: No        Need for Family Participation in Patient Care: Yes (Comment) Care giver support system in place?: No (comment)   Criminal Activity/Legal Involvement Pertinent to Current Situation/Hospitalization: No - Comment as needed  Activities of Daily Living Home Assistive Devices/Equipment: None ADL Screening (condition at time of admission) Patient's cognitive ability adequate to safely complete daily activities?: Yes Is the patient deaf or have difficulty hearing?: No Does the patient have difficulty seeing, even when wearing glasses/contacts?: No Does the patient have difficulty concentrating, remembering, or making decisions?: No Patient able to express need for assistance with  ADLs?: Yes Does the patient have difficulty dressing or bathing?: No Independently performs ADLs?: Yes (appropriate for developmental age) Does the patient have difficulty walking or climbing stairs?: No Weakness of Legs: Left Weakness of Arms/Hands: Left  Permission Sought/Granted Permission sought to share information with : Facility Art therapist granted to share information with : Yes, Verbal Permission Granted              Emotional Assessment       Orientation: : Oriented to Self, Oriented to Place, Oriented to  Time, Oriented to Situation Alcohol / Substance Use: Not Applicable Psych Involvement: No (comment)  Admission diagnosis:  Delirium [R41.0] AMS (altered mental status) [R41.82] Patient Active Problem List   Diagnosis Date Noted   Vertigo 09/26/2022   Left-sided weakness 09/26/2022   DM (diabetes mellitus), type 2 with complications ([place]) 41/28/7867   Thyroid cancer ([place]) 67/20/9470   Acute metabolic encephalopathy 96/28/3662   Lumbar strain 08/13/2022   Lipoma of left upper extremity    Exercise intolerance 12/26/2021   Heart murmur 12/26/2021   DOE (dyspnea on exertion) 12/26/2021   Palpitations 12/26/2021   Abdominal bloating    Polyp of colon    Multiple adenomatous polyps    Depression    Hyperlipidemia associated with type 2 diabetes mellitus ([facility])    GERD (gastroesophageal reflux disease)    Influenza A 01/26/2019   Hypokalemia 06/12/2017   Marijuana use 06/12/2017   Diabetic neuropathy ([place]) 01/07/2016  Plantar fasciitis of left foot 11/26/2015   Back pain 10/10/2015   Asthma 09/29/2015   Essential hypertension 09/29/2015   DM (diabetes mellitus) type II controlled, neurological manifestation ([place]) 09/29/2015   PCP:  [name], MD Pharmacy:   [facility], [place] [address] [place] [address] Phone: [phone] Fax: [place],  [place] [address] [place] [place] [address] Phone: [phone] Fax: [phone]     Social Determinants of Health (SDOH) Interventions    Readmission Risk Interventions     No data to display

## 2022-09-29 ENCOUNTER — Ambulatory Visit: Payer: Medicare Other | Admitting: Obstetrics and Gynecology

## 2022-09-29 DIAGNOSIS — R531 Weakness: Secondary | ICD-10-CM | POA: Diagnosis not present

## 2022-09-29 DIAGNOSIS — I10 Essential (primary) hypertension: Secondary | ICD-10-CM | POA: Diagnosis not present

## 2022-09-29 DIAGNOSIS — G9341 Metabolic encephalopathy: Secondary | ICD-10-CM | POA: Diagnosis not present

## 2022-09-29 LAB — GLUCOSE, CAPILLARY
Glucose-Capillary: 237 mg/dL — ABNORMAL HIGH (ref 70–99)
Glucose-Capillary: 310 mg/dL — ABNORMAL HIGH (ref 70–99)
Glucose-Capillary: 178 mg/dL — ABNORMAL HIGH (ref 70–99)
Glucose-Capillary: 368 mg/dL — ABNORMAL HIGH (ref 70–99)

## 2022-09-29 MED ORDER — INSULIN GLARGINE-YFGN 100 UNIT/ML ~~LOC~~ SOLN
15.0000 [IU] | Freq: Every day | SUBCUTANEOUS | Status: DC
  Filled 2022-09-29: qty 0.15

## 2022-09-29 MED ORDER — AMLODIPINE BESYLATE 10 MG PO TABS
10.0000 mg | ORAL_TABLET | Freq: Every day | ORAL | Status: DC
  Administered 2022-09-29 – 2022-09-30 (×2): 10 mg via ORAL
  Filled 2022-09-29 (×2): qty 1

## 2022-09-29 MED ORDER — METOPROLOL TARTRATE 50 MG PO TABS
100.0000 mg | ORAL_TABLET | Freq: Two times a day (BID) | ORAL | Status: DC
  Administered 2022-09-29 – 2022-09-30 (×3): 100 mg via ORAL
  Filled 2022-09-29 (×3): qty 2

## 2022-09-29 MED ORDER — HYDRALAZINE HCL 20 MG/ML IJ SOLN
10.0000 mg | Freq: Once | INTRAMUSCULAR | Status: AC
  Administered 2022-09-29: 10 mg via INTRAVENOUS
  Filled 2022-09-29: qty 1

## 2022-09-29 MED ORDER — ORAL CARE MOUTH RINSE: 15.0000 mL | OROMUCOSAL | Status: DC | PRN

## 2022-09-29 NOTE — Progress Notes (Deleted)
[facility] Return Visit  SUBJECTIVE  History of Present Illness: [name] is a 56 y.o. female seen in follow-up for ***. She is interested in surgery for her prolapse and incontinence.   Urodynamic Impression:  1. Sensation was increased; capacity was reduced 2. Stress Incontinence was demonstrated at normal pressures; 3. Detrusor Overactivity was demonstrated without leakage. 4. Emptying was normal with a normal PVR, a sustained detrusor contraction present,  abdominal straining not present, normal urethral sphincter activity on EMG.  Past Medical History: Patient  has a past medical history of Acute diverticulitis, Acute pain of right knee (10/14/2016), Acute renal failure (ARF) ([place]) (01/26/2019), Anemia, Arthritis, Chest pain, Chronic back pain, COPD with asthma ([place]), Depression, Diabetes mellitus without complication ([place]), Diastolic dysfunction, Diverticulitis large intestine (05/12/2020), Dyspnea, GERD (gastroesophageal reflux disease), Heart murmur, Hypertension, Palpitations, and Urinary incontinence.   Past Surgical History: She  has a past surgical history that includes Abdominal hysterectomy; Tubal ligation; Colonoscopy with propofol (N/A, 10/02/2021); Esophagogastroduodenoscopy (N/A, 10/02/2021); transthoracic echocardiogram (09/29/2015); Coronary CT angiogram (01/03/2022); transthoracic echocardiogram (01/09/2022); and 7-Day Zio Patch Monitor (12/2021).   Medications: She has a current medication list which includes the following prescription(s): albuterol, albuterol, atorvastatin, budesonide-formoterol, vitamin d3, diclofenac sodium, duloxetine, fluticasone-salmeterol, gabapentin, glipizide, ibuprofen, loratadine, losartan, metformin, methocarbamol, metoprolol tartrate, omeprazole, oxycodone, rybelsus, and sertraline, and the following Facility-Administered Medications: acetaminophen, albuterol, amlodipine, atorvastatin, cyclobenzaprine, enoxaparin,  gabapentin, hydralazine, insulin aspart, insulin aspart, insulin glargine-yfgn, losartan, meclizine, melatonin, metoprolol tartrate **AND** [DISCONTINUED] metoprolol tartrate, mometasone-formoterol, nicotine, mouth rinse, polyethylene glycol, prochlorperazine, sertraline, and sumatriptan.   Allergies: Patient has No Known Allergies.   Social History: Patient  reports that she quit smoking about 17 years ago. Her smoking use included cigarettes. She has a 30.00 pack-year smoking history. She has never used smokeless tobacco. She reports that she does not drink alcohol and does not use drugs.      OBJECTIVE     Physical Exam: There were no vitals filed for this visit. Gen: No apparent distress, A&O x 3.  Detailed Urogynecologic Evaluation:  Deferred. Prior exam showed:  POP-Q:    POP-Q   0                                            Aa   0                                           Ba   -5                                              C    3.5                                            Gh   4.5                                            Pb  7                                            tvl    -1                                            Ap   -1                                            Bp                                                  D        ASSESSMENT AND PLAN    Ms. Collantes is a 56 y.o. with:  No diagnosis found.

## 2022-09-29 NOTE — TOC Progression Note (Signed)
Transition of Care [facility]) - Progression Note    Patient Details  Name: [name] MRN: [id number] Date of Birth: Sep 28, 1966  [facility] CM/SW Contact  Pollie Friar, RN Phone Number: 09/29/2022, 1:25 PM  Clinical Narrative:    Pt provided bed offers. She has selected [facility]. CM has sent Juliann Pulse with [facility] a message to see about bed availability.  No insurance auth required.  TOC following.   Expected Discharge Plan: Skilled Nursing Facility Barriers to Discharge: SNF Pending bed offer  Expected Discharge Plan and Services Expected Discharge Plan: [place] arrangements for the past 2 months: Apartment                                       Social Determinants of Health (SDOH) Interventions    Readmission Risk Interventions     No data to display

## 2022-09-29 NOTE — Progress Notes (Signed)
PROGRESS NOTE    [name]  YIR:[id number] DOB: 04-Apr-1966 DOA: 09/25/2022 PCP: [name], MD   Brief Narrative:  56 y.o. female with medical history significant for hypertension, hyperlipidemia, type 2 diabetes, recently diagnosed thyroid cancer followed by hematology oncology at [facility] who presented to [facility] ED due to altered mental status and left-sided weakness.  Last known well was around 11:30 PM.  Upon arrival to the ED, there was concern for possible left sided neglect for which a code stroke was called.  The patient was seen by neurology/stroke team and CVA work up was initiated.  CT head and MRI brain were negative for acute CVA.  PT has recommended SNF placement.  Currently medically stable for discharge to SNF.  Assessment & Plan:   Acute metabolic encephalopathy -Multifactorial in setting of hyperglycemia, dehydration, dizziness -CT head: No evidence of acute intracranial abnormality. -CTA head and neck: No large vessel occlusion or significant stenosis in the head or neck. -MRI brain: No acute intracranial abnormality. -EEG: No seizures or epileptiform discharges were seen throughout the recording. -UA: Unremarkable  -Resolved.  Monitor mental status.   Migraine -CT head unremarkable -Continue trial of Flexeril, Imitrex as needed   Vertigo -MRI brain negative for stroke -Continue meclizine as needed   Left-sided weakness -MRI brain negative for stroke -PT OT recommending SNF placement   Diabetes mellitus, uncontrolled with hyperglycemia, with neuropathy -Hemoglobin A1c 10.7 -Continue gabapentin -Continue long-acting and sliding scale insulin   Hypertension -Continue Cozaar.  Blood pressure extremely high.  Increase Lopressor to 100 mg twice a day.  Add amlodipine 10 mg daily.  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 This recommendation follows ACR consensus guidelines: Management of Incidental Pancreatic Cysts: A White Paper of the ACR Incidental Findings Committee. [place] 5009;38:182-993.     DVT prophylaxis:  enoxaparin (LOVENOX) injection 40 mg Start: 09/25/22 2330   Code Status: Full code Family Communication: None at bedside Disposition Plan: Status is: Inpatient Remains inpatient appropriate because: Of need for SNF placement    Consultants: Neurology  Procedures: Echo  Antimicrobials: None   Subjective: Patient seen and examined at bedside.  Complained of dizziness with some nausea earlier this morning.  No chest pain or shortness of breath or fever reported. Objective: Vitals:   09/29/22 0247 09/29/22 0349 09/29/22 0834 09/29/22 0847  BP: (!) 170/109 (!) 153/103  (!) 140/124  Pulse: 78   (!) 103  Resp: 16   16  Temp: 98.2 F (36.8 C)   (!) 97.5 F (36.4 C)  TempSrc: Oral   Oral  SpO2: 98%  98% 100%    Intake/Output Summary (Last 24 hours) at 09/29/2022 0946 Last data filed at 09/28/2022 2130 Gross per 24 hour  Intake 240 ml  Output --  Net 240 ml    There were no vitals filed for this visit.  Examination:  General: No acute distress.  Still on room air.   Respiratory: Bilateral decreased breath sounds at bases with some scattered crackles CVS: S1 and S2 are heard; mostly rate controlled  abdominal: Soft, nontender, distended mildly, no organomegaly; bowel sounds heard  extremities: No cyanosis; mild lower extremity edema present    Data Reviewed: I have personally reviewed following labs and imaging studies  CBC: Recent Labs  Lab 09/25/22 1530  09/25/22 1536 09/25/22 1537 09/26/22 0103  WBC 5.3  --   --  7.2  NEUTROABS 3.3  --   --   --   HGB 12.1 12.6 12.6 11.7*  HCT 36.8 37.0 37.0 36.1  MCV 93.2  --   --  92.1  PLT 208  --   --  234    Basic  Metabolic Panel: Recent Labs  Lab 09/25/22 1530 09/25/22 1536 09/25/22 1537 09/26/22 0103 09/26/22 1823 09/27/22 0347 09/28/22 0712  NA 139 140 138 143  --  141 140  K 2.7* 2.7* 2.7* 2.8* 4.3 3.6 3.5  CL 103 101  --  109  --  110 105  CO2 26  --   --  26  --  23 24  GLUCOSE 457* 467*  --  70  --  230* 118*  BUN 12 11  --  8  --  9 11  CREATININE 0.99 0.80  --  0.84  --  0.84 0.84  CALCIUM 10.4*  --   --  9.7  --  9.4 10.4*  MG 1.6*  --   --  2.1  --   --   --   PHOS  --   --   --  2.6  --   --   --     GFR: Estimated Creatinine Clearance: 63.2 mL/min (by C-G formula based on SCr of 0.84 mg/dL). Liver Function Tests: Recent Labs  Lab 09/25/22 1530 09/26/22 0103  AST 25 19  ALT 20 20  ALKPHOS 112 85  BILITOT 0.3 0.4  PROT 6.3* 5.8*  ALBUMIN 3.3* 3.2*    No results for input(s): "LIPASE", "AMYLASE" in the last 168 hours. No results for input(s): "AMMONIA" in the last 168 hours. Coagulation Profile: Recent Labs  Lab 09/25/22 1530  INR 0.9    Cardiac Enzymes: No results for input(s): "CKTOTAL", "CKMB", "CKMBINDEX", "TROPONINI" in the last 168 hours. BNP (last 3 results) No results for input(s): "PROBNP" in the last 8760 hours. HbA1C: No results for input(s): "HGBA1C" in the last 72 hours.  CBG: Recent Labs  Lab 09/28/22 0550 09/28/22 1238 09/28/22 1608 09/28/22 2228 09/29/22 0615  GLUCAP 182* 270* 195* 186* 237*    Lipid Profile: No results for input(s): "CHOL", "HDL", "LDLCALC", "TRIG", "CHOLHDL", "LDLDIRECT" in the last 72 hours. Thyroid Function Tests: No results for input(s): "TSH", "T4TOTAL", "FREET4", "T3FREE", "THYROIDAB" in the last 72 hours.  Anemia Panel: No results for input(s): "VITAMINB12", "FOLATE", "FERRITIN", "TIBC", "IRON", "RETICCTPCT" in the last 72 hours. Sepsis Labs: No results for input(s): "PROCALCITON", "LATICACIDVEN" in the last 168 hours.  No results found for this or any previous visit (from the past 240 hour(s)).        Radiology Studies: No results found.      Scheduled Meds:  amLODipine  10 mg Oral Daily   atorvastatin  10 mg Oral Daily   enoxaparin (LOVENOX) injection  40 mg Subcutaneous Q24H   gabapentin  300 mg Oral BID   insulin aspart  0-15 Units Subcutaneous TID WC   insulin aspart  0-5 Units Subcutaneous QHS   insulin glargine-yfgn  10 Units Subcutaneous Daily   losartan  100 mg Oral Daily   metoprolol tartrate  100 mg Oral BID   mometasone-formoterol  2 puff Inhalation BID   nicotine  21 mg Transdermal Daily   sertraline  75 mg Oral BH-[id number]   Continuous Infusions:        Aline August, MD [facility] 09/29/2022, 9:46 AM

## 2022-09-29 NOTE — Care Management Important Message (Signed)
Important Message  Patient Details  Name: [name] MRN: [id number] Date of Birth: September 01, 1966   Medicare Important Message Given:  Yes     Robbye Dede 09/29/2022, 3:50 PM

## 2022-09-29 NOTE — Progress Notes (Signed)
TRH night cross cover note:   I was notified by RN of the patient's hypertensive blood pressures and limited response to interval 5 mg of IV hydralazine, which was given per prn order for the following parameters: SBP greater than 629 or diastolic blood pressure greater than 105.  Blood pressure around midnight noted to be 162/104, with most recent blood pressure noted to be 168/105 following interval administration of the 5 mg of IV hydralazine.  Per trended vital signs, no interval transient improvement in blood pressure following dose of hydralazine leading up to this most recent value.  Heart rate in the 70s.  Patient asymptomatic, and asleep at this time.   I placed order for one-time dose of hydralazine 10 mg IV, and will monitor response in BP to this measure.     Babs Bertin, DO Hospitalist

## 2022-09-30 DIAGNOSIS — C73 Malignant neoplasm of thyroid gland: Secondary | ICD-10-CM

## 2022-09-30 LAB — GLUCOSE, CAPILLARY
Glucose-Capillary: 192 mg/dL — ABNORMAL HIGH (ref 70–99)
Glucose-Capillary: 398 mg/dL — ABNORMAL HIGH (ref 70–99)

## 2022-09-30 MED ORDER — MECLIZINE HCL 25 MG PO TABS: 25.0000 mg | ORAL_TABLET | Freq: Three times a day (TID) | ORAL | 0 refills | Status: AC | PRN

## 2022-09-30 MED ORDER — INSULIN GLARGINE-YFGN 100 UNIT/ML ~~LOC~~ SOLN: 20.0000 [IU] | Freq: Every day | SUBCUTANEOUS | 0 refills | Status: DC

## 2022-09-30 MED ORDER — INSULIN GLARGINE-YFGN 100 UNIT/ML ~~LOC~~ SOLN
20.0000 [IU] | Freq: Every day | SUBCUTANEOUS | Status: DC
  Administered 2022-09-30: 20 [IU] via SUBCUTANEOUS
  Filled 2022-09-30: qty 0.2

## 2022-09-30 MED ORDER — GABAPENTIN 300 MG PO CAPS: 300.0000 mg | ORAL_CAPSULE | Freq: Two times a day (BID) | ORAL | 0 refills | Status: AC

## 2022-09-30 MED ORDER — METOPROLOL TARTRATE 50 MG PO TABS: 100.0000 mg | ORAL_TABLET | Freq: Two times a day (BID) | ORAL | Status: DC

## 2022-09-30 MED ORDER — AMLODIPINE BESYLATE 10 MG PO TABS: 10.0000 mg | ORAL_TABLET | Freq: Every day | ORAL | 0 refills | Status: DC

## 2022-09-30 NOTE — Progress Notes (Signed)
Physical Therapy Treatment Patient Details Name: [name] MRN: [id number] DOB: 02-Jun-1966 Today's Date: 09/30/2022   History of Present Illness Pt is a 56 y/o female admitted secondary to AMS. MRI negative. Admitted withencephalopathy. PMH includes HTN, depression, and DM.    PT Comments    Pt making excellent progress.  She ambulated 100' x 2 with min guard with improved balance. She reports L leg still feels weak but improved.  Denies any dizziness today.  Pt is able to transport by care - notified case manager.  Pt has bed at SNF and should be discharging soon; if does not consider updating to home with supervision next visit.     Recommendations for follow up therapy are one component of a multi-disciplinary discharge planning process, led by the attending physician.  Recommendations may be updated based on patient status, additional functional criteria and insurance authorization.  Follow Up Recommendations  Skilled nursing-short term rehab (<3 hours/day) (d/c orders in and bed at SNF; if does not leave today consider updating to home) Can patient physically be transported by private vehicle: Yes   Assistance Recommended at Discharge Set up Supervision/Assistance  Patient can return home with the following Assistance with cooking/housework;Assist for transportation;Help with stairs or ramp for entrance;A little help with bathing/dressing/bathroom;A little help with walking and/or transfers   Equipment Recommendations  None recommended by PT    Recommendations for Other Services       Precautions / Restrictions Precautions Precautions: Fall     Mobility  Bed Mobility               General bed mobility comments: sitting at arrival    Transfers Overall transfer level: Needs assistance Equipment used: Rolling walker (2 wheels) Transfers: Sit to/from Stand Sit to Stand: Min guard           General transfer comment: Min guard for safety; performed x 12  during session    Ambulation/Gait Ambulation/Gait assistance: Min guard Gait Distance (Feet): 100 Feet (100'x2) Assistive device: Rolling walker (2 wheels) Gait Pattern/deviations: Step-through pattern, Decreased stride length Gait velocity: decreased     General Gait Details: Min cues for RW proximity   Chief Strategy Officer    Modified Rankin (Stroke Patients Only)       Balance Overall balance assessment: Needs assistance Sitting-balance support: No upper extremity supported, Feet supported Sitting balance-Leahy Scale: Good     Standing balance support: No upper extremity supported, Bilateral upper extremity supported Standing balance-Leahy Scale: Fair Standing balance comment: RW to ambulate but able to static stand without AD                            Cognition Arousal/Alertness: Awake/alert Behavior During Therapy: WFL for tasks assessed/performed Overall Cognitive Status: No family/caregiver present to determine baseline cognitive functioning                                 General Comments: Daughter present initially, but left to pick up her daughter.  Pt followed commands well, overall appropriate during therapy but not formallytested        Exercises General Exercises - Lower Extremity Long Arc Quad: AROM, Left, 10 reps, Seated Hip Flexion/Marching: AROM, Both, 10 reps, Standing (RW and min guard) Heel Raises: AROM, Both, Standing (RW and min guard) Other Exercises  Other Exercises: Sit to stand with L leg favored for strengthening (R leg forward) 10 reps min guard    General Comments General comments (skin integrity, edema, etc.): Reports L leg still feels weak; denies any dizziness (head turns no complaints)      Pertinent Vitals/Pain Pain Assessment Pain Assessment: No/denies pain    Home Living                          Prior Function            PT Goals (current goals can now be  found in the care plan section) Progress towards PT goals: Progressing toward goals    Frequency    Min 3X/week      PT Plan Frequency needs to be updated (d/c orders in and bed at SNF; if does not leave today consider updating to home)    Co-evaluation              AM-PAC PT "6 Clicks" Mobility   Outcome Measure  Help needed turning from your back to your side while in a flat bed without using bedrails?: None Help needed moving from lying on your back to sitting on the side of a flat bed without using bedrails?: None Help needed moving to and from a bed to a chair (including a wheelchair)?: A Little Help needed standing up from a chair using your arms (e.g., wheelchair or bedside chair)?: A Little Help needed to walk in hospital room?: A Little Help needed climbing 3-5 steps with a railing? : A Little 6 Click Score: 20    End of Session Equipment Utilized During Treatment: Gait belt Activity Tolerance: Patient tolerated treatment well Patient left: in bed;with call bell/phone within reach Nurse Communication: Mobility status PT Visit Diagnosis: Unsteadiness on feet (R26.81);Muscle weakness (generalized) (M62.81);Difficulty in walking, not elsewhere classified (R26.2)     Time: 8841-6606 PT Time Calculation (min) (ACUTE ONLY): 17 min  Charges:  $Gait Training: 8-22 mins                     Abran Richard, PT Acute Rehab [facility] 09/30/2022, 3:02 PM

## 2022-09-30 NOTE — TOC Transition Note (Signed)
Transition of Care [facility]) - CM/SW Discharge Note   Patient Details  Name: HYACINTH MARCELLI MRN: [id number] Date of Birth: [birth year]  [facility] CM/SW Contact:  Geralynn Ochs, LCSW Phone Number: 09/30/2022, 11:56 AM   Clinical Narrative:   CSW confirmed with MD that patient is stable for discharge to SNF today, [facility] has a bed available. CSW sent discharge information to [facility] Transport arranged with [facility] for next available.  Nurse to call report to [phone], Room 117a.    Final next level of care: Skilled Nursing Facility Barriers to Discharge: Barriers Resolved   Patient Goals and CMS Choice   CMS Medicare.gov Compare Post Acute Care list provided to:: Patient Choice offered to / list presented to : Patient  Discharge Placement              Patient chooses bed at: [facility] Patient to be transferred to facility by: Page Name of family member notified: Self Patient and family notified of of transfer: 09/30/22  Discharge Plan and Services                                     Social Determinants of Health (SDOH) Interventions     Readmission Risk Interventions     No data to display

## 2022-09-30 NOTE — Discharge Summary (Signed)
Physician Discharge Summary  [name] DGU:[id number] DOB: [birth year] DOA: 09/25/2022  PCP: [name], MD  Admit date: 09/25/2022 Discharge date: 09/30/2022  Admitted From: Home Disposition: SNF  Recommendations for Outpatient Follow-up:  Follow up with SNF provider at earliest convenience Outpatient follow-up with neurology Follow up in ED if symptoms worsen or new appear   Home Health: No Equipment/Devices: None  Discharge Condition: Stable CODE STATUS: Full Diet recommendation: Heart healthy  Brief/Interim Summary: 56 y.o. female with medical history significant for hypertension, hyperlipidemia, type 2 diabetes, recently diagnosed thyroid cancer followed by hematology oncology at [facility] who presented to [facility] ED due to altered mental status and left-sided weakness.  Last known well was around 11:30 PM.  Upon arrival to the ED, there was concern for possible left sided neglect for which a code stroke was called.  The patient was seen by neurology/stroke team and CVA work up was initiated.  CT head and MRI brain were negative for acute CVA.  PT has recommended SNF placement.  Currently medically stable for discharge to SNF.  Discharge to SNF once bed is available.  Discharge Diagnoses:   Acute metabolic encephalopathy -Multifactorial in setting of hyperglycemia, dehydration, dizziness -CT head: No evidence of acute intracranial abnormality. -CTA head and neck: No large vessel occlusion or significant stenosis in the head or neck. -MRI brain: No acute intracranial abnormality. -EEG: No seizures or epileptiform discharges were seen throughout the recording. -UA: Unremarkable  -Mental status has much improved and is back to baseline. outpatient follow-up with neurology.    Migraine -CT head unremarkable -Treated with as needed Imitrex and Flexeril in the hospital.  Improved.  Outpatient follow-up with neurology.  Can use Imitrex as needed.  Vertigo -MRI brain negative  for stroke.  Improved. -Continue meclizine as needed   Left-sided weakness -MRI brain negative for stroke -PT OT recommending SNF placement   Diabetes mellitus, uncontrolled with hyperglycemia, with neuropathy -Hemoglobin A1c 10.7 -Continue gabapentin -Continue long-acting insulin.  Resume home regimen except for glipizide.   Hypertension -Continue Cozaar.  Blood pressure still intermittently on the higher side.  Continue increased dose of Lopressor 100 mg twice a day.  Continue amlodipine 10 mg daily on discharge.  Outpatient follow-up with PCP/SNF provider.   Hyperlipidemia -Continue Lipitor   Recently diagnosed thyroid cancer -Follow-up with hematology/oncology outpatient   Incidental 7 mm cyst within the pancreatic tail -Recommend follow up pre and post contrast MRI/MRCP or pancreatic protocol CT in 1 year. This recommendation follows ACR consensus guidelines: Management of Incidental Pancreatic Cysts: A White Paper of the ACR Incidental Findings Committee. [place] 9563;87:564-332.     Discharge Instructions  Discharge Instructions     Ambulatory referral to Neurology   Complete by: As directed    An appointment is requested in approximately: 2 weeks   Diet - low sodium heart healthy   Complete by: As directed    Diet Carb Modified   Complete by: As directed    Increase activity slowly   Complete by: As directed       Allergies as of 09/30/2022   No Known Allergies      Medication List     STOP taking these medications    DULoxetine 20 MG capsule Commonly known as: CYMBALTA   fluticasone-salmeterol 250-50 MCG/ACT Aepb Commonly known as: Advair Diskus   gabapentin 600 MG tablet Commonly known as: NEURONTIN Replaced by: gabapentin 300 MG capsule   glipiZIDE 5 MG tablet Commonly known as: Glucotrol  oxyCODONE 5 MG immediate release tablet Commonly known as: Oxy IR/ROXICODONE       TAKE these medications    albuterol (2.5 MG/3ML)  0.083% nebulizer solution Commonly known as: PROVENTIL Take 3 mLs (2.5 mg total) by nebulization every 2 (two) hours as needed for wheezing or shortness of breath.   albuterol 108 (90 Base) MCG/ACT inhaler Commonly known as: VENTOLIN HFA Inhale 2 puffs into the lungs every 6 (six) hours as needed for wheezing or shortness of breath.   amLODipine 10 MG tablet Commonly known as: NORVASC Take 1 tablet (10 mg total) by mouth daily.   atorvastatin 10 MG tablet Commonly known as: LIPITOR Take 10 mg by mouth daily.   budesonide-formoterol 160-4.5 MCG/ACT inhaler Commonly known as: Symbicort Inhale 2 puffs into the lungs 2 (two) times daily.   diclofenac sodium 1 % Gel Commonly known as: VOLTAREN Apply 2 g topically 4 (four) times daily as needed (pain).   gabapentin 300 MG capsule Commonly known as: Neurontin Take 1 capsule (300 mg total) by mouth 2 (two) times daily for 15 days. Replaces: gabapentin 600 MG tablet   ibuprofen 600 MG tablet Commonly known as: ADVIL Take 1 tablet (600 mg total) by mouth every 6 (six) hours as needed. What changed:  how much to take reasons to take this   insulin glargine-yfgn 100 UNIT/ML injection Commonly known as: SEMGLEE Inject 0.2 mLs (20 Units total) into the skin daily.   loratadine 10 MG tablet Commonly known as: CLARITIN Take 1 tablet (10 mg total) by mouth daily.   losartan 100 MG tablet Commonly known as: COZAAR Take 1 tablet (100 mg total) by mouth daily.   meclizine 25 MG tablet Commonly known as: ANTIVERT Take 1 tablet (25 mg total) by mouth 3 (three) times daily as needed for dizziness.   metFORMIN 1000 MG tablet Commonly known as: Glucophage Take 1 tablet (1,000 mg total) by mouth 2 (two) times daily with a meal. See discharge instructions for how to take medicine at first   methocarbamol 500 MG tablet Commonly known as: ROBAXIN TAKE 1 TABLET BY MOUTH EVERY 6 HOURS AS NEEDED FOR MUSCLE SPASMS   metoprolol tartrate 50  MG tablet Commonly known as: LOPRESSOR Take 2 tablets (100 mg total) by mouth 2 (two) times daily. What changed:  how much to take how to take this when to take this additional instructions   omeprazole 40 MG capsule Commonly known as: PRILOSEC Take 1 capsule (40 mg total) by mouth daily.   Rybelsus 7 MG Tabs Generic drug: Semaglutide Take by mouth daily.   sertraline 50 MG tablet Commonly known as: ZOLOFT Take 1.5 tablets (75 mg total) by mouth every morning.   Vitamin D3 1.25 MG (50000 UT) Caps Take 1 capsule by mouth once a week.        Follow-up Information     [name], MD. Schedule an appointment as soon as possible for a visit in 1 week(s).   Specialty: Internal Medicine Contact information: [address] [place] [address] [phone]         Leonie Man, MD .   Specialty: Cardiology Contact information: [address] [place] [address] [phone]                No Known Allergies  Consultations: Neurology   Procedures/Studies: CT HEAD WO CONTRAST (5MM)  Result Date: 09/27/2022 CLINICAL DATA:  56 year old female with new or increasing headache. EXAM: CT HEAD WITHOUT CONTRAST TECHNIQUE:  Contiguous axial images were obtained from the base of the skull through the vertex without intravenous contrast. RADIATION DOSE REDUCTION: This exam was performed according to the departmental dose-optimization program which includes automated exposure control, adjustment of the mA and/or kV according to patient size and/or use of iterative reconstruction technique. COMPARISON:  Recent brain MRI 09/25/2022 and earlier. FINDINGS: Brain: Incidental cavum septum pellucidum, normal variant. No midline shift, ventriculomegaly, mass effect, evidence of mass lesion, intracranial hemorrhage or evidence of cortically based acute infarction. Gray-white matter differentiation is within normal limits throughout the brain. Patchy cerebral  white matter and pontine abnormality by MRI largely occult by CT. Vascular: No suspicious intracranial vascular hyperdensity. Skull: No acute osseous abnormality identified. Sinuses/Orbits: Visualized paranasal sinuses and mastoids are stable and well aerated. Other: Stable orbit and scalp soft tissues. IMPRESSION: Stable non contrast CT appearance of the brain. No acute intracranial abnormality. Electronically Signed   By: Genevie Ann M.D.   On: 09/27/2022 09:18   EEG adult  Result Date: 09/26/2022 Lora Havens, MD     09/26/2022  8:38 AM Patient Name: ELEFTHERIA TABORN MRN: [id number] Epilepsy Attending: Lora Havens Referring Physician/Provider: Kayleen Memos, DO Date: 09/26/2022 Duration: 25.32 mins Patient history: 56yo F with ams. EEG to evaluate for seizure Level of alertness: Awake, asleep AEDs during EEG study: GBP Technical aspects: This EEG study was done with scalp electrodes positioned according to the 10-20 International system of electrode placement. Electrical activity was reviewed with band pass filter of 1-'70Hz'$ , sensitivity of 7 uV/mm, display speed of 17m/sec with a '60Hz'$  notched filter applied as appropriate. EEG data were recorded continuously and digitally stored.  Video monitoring was available and reviewed as appropriate. Description: The posterior dominant rhythm consists of 9-10 Hz activity of moderate voltage (25-35 uV) seen predominantly in posterior head regions, symmetric and reactive to eye opening and eye closing. Sleep was characterized by vertex waves, sleep spindles (12 to 14 Hz), maximal frontocentral region. Hyperventilation and photic stimulation were not performed.   IMPRESSION: This study is within normal limits. No seizures or epileptiform discharges were seen throughout the recording. A normal interictal EEG does not exclude the diagnosis of epilepsy. PLora Havens  CT ABDOMEN PELVIS W CONTRAST  Result Date: 09/25/2022 CLINICAL DATA:  Abdominal pain, lethargy,  left-sided neglect EXAM: CT ABDOMEN AND PELVIS WITH CONTRAST TECHNIQUE: Multidetector CT imaging of the abdomen and pelvis was performed using the standard protocol following bolus administration of intravenous contrast. RADIATION DOSE REDUCTION: This exam was performed according to the departmental dose-optimization program which includes automated exposure control, adjustment of the mA and/or kV according to patient size and/or use of iterative reconstruction technique. CONTRAST:  662mOMNIPAQUE IOHEXOL 350 MG/ML SOLN COMPARISON:  06/21/2022 FINDINGS: Lower chest: Scattered hypoventilatory changes at the lung bases. Stable nodular consolidation in the lingula compatible with atelectasis or scarring, unchanged since 2021. Hepatobiliary: Scattered subcentimeter liver hypodensities are unchanged, compatible with multiple cysts. No biliary duct dilation. Gallbladder is unremarkable. Pancreas: There is a 7 mm hypodensity within the tail the pancreas reference image 28/3. The remainder of the pancreas is unremarkable. Spleen: Normal in size without focal abnormality. Adrenals/Urinary Tract: Stable indeterminate 2.9 cm left adrenal mass, previously characterized as adenoma. Right adrenal is unremarkable. The kidneys are stable, with symmetrical enhancement. Excreted contrast within the bladder consistent with previous CT and MRI procedures performed earlier today. No filling defects. Stomach/Bowel: No bowel obstruction or ileus. Scattered colonic diverticulosis without diverticulitis. Normal appendix right lower  quadrant. No bowel wall thickening or inflammatory change. Vascular/Lymphatic: No significant vascular findings are present. No enlarged abdominal or pelvic lymph nodes. Reproductive: Status post hysterectomy. No adnexal masses. Pessary in place. Other: No free fluid or free intraperitoneal gas. No abdominal wall hernia. Musculoskeletal: No acute or destructive bony lesions. Reconstructed images demonstrate no  additional findings. IMPRESSION: 1. No acute intra-abdominal or intrapelvic process. 2. Distal colonic diverticulosis without diverticulitis. 3. Incidental 7 mm cyst within the pancreatic tail. Recommend follow up pre and post contrast MRI/MRCP or pancreatic protocol CT in 1 year. This recommendation follows ACR consensus guidelines: Management of Incidental Pancreatic Cysts: A White Paper of the ACR Incidental Findings Committee. J Am Coll Radiol 9924;26:834-196. 4. Stable left adrenal mass, previously characterized as adenoma. Electronically Signed   By: Randa Ngo M.D.   On: 09/25/2022 21:48   MR BRAIN WO CONTRAST  Result Date: 09/25/2022 CLINICAL DATA:  Neuro deficit acute, stroke suspected. Left-sided neglect noted. Encephalopathy. Extreme hyperglycemia. Extreme hypertension. EXAM: MRI HEAD WITHOUT CONTRAST TECHNIQUE: Multiplanar, multiecho pulse sequences of the brain and surrounding structures were obtained without intravenous contrast. COMPARISON:  CT head without contrast 09/25/2022. MR head without contrast 04/21/2020 FINDINGS: Brain: Progressive moderate periventricular T2 hyperintensities are present bilaterally. Scattered subcortical T2 hyperintensities have progressed as well. No acute infarct, hemorrhage, or mass lesion is present. The ventricles are of normal size. Mild generalized atrophy is progressed. No significant extraaxial fluid collection is present. White matter changes involving brainstem progressed. Cerebellum is within limits. Vascular: Flow is present in the major intracranial arteries. Skull and upper cervical spine: The craniocervical junction is normal. Upper cervical spine is within normal limits. Marrow signal is unremarkable. Sinuses/Orbits: The paranasal sinuses and mastoid air cells are clear. The globes and orbits are within normal limits. IMPRESSION: 1. No acute intracranial abnormality. 2. Progressive moderate periventricular and subcortical T2 hyperintensities  bilaterally. This most likely reflects the sequelae of microvascular ischemia. Electronically Signed   By: San Morelle M.D.   On: 09/25/2022 20:56   DG CHEST PORT 1 VIEW  Result Date: 09/25/2022 CLINICAL DATA:  Lethargy, weakness, code stroke EXAM: PORTABLE CHEST 1 VIEW COMPARISON:  08/20/2022 FINDINGS: Transverse diameter of heart is increased. There are no signs of pulmonary edema or focal pulmonary consolidation. There is no pleural effusion or pneumothorax. IMPRESSION: Cardiomegaly. There are no signs of pulmonary edema or focal pulmonary consolidation. Electronically Signed   By: Elmer Picker M.D.   On: 09/25/2022 17:04   CT ANGIO HEAD NECK W WO CM W PERF (CODE STROKE)  Result Date: 09/25/2022 CLINICAL DATA:  Headache, sudden, severe Subarachnoid hemorrhage [facility]). Left-sided neglect. EXAM: CT ANGIOGRAPHY HEAD AND NECK CT PERFUSION BRAIN TECHNIQUE: Multidetector CT imaging of the head and neck was performed using the standard protocol during bolus administration of intravenous contrast. Multiplanar CT image reconstructions and MIPs were obtained to evaluate the vascular anatomy. Carotid stenosis measurements (when applicable) are obtained utilizing NASCET criteria, using the distal internal carotid diameter as the denominator. Multiphase CT imaging of the brain was performed following IV bolus contrast injection. Subsequent parametric perfusion maps were calculated using RAPID software. RADIATION DOSE REDUCTION: This exam was performed according to the departmental dose-optimization program which includes automated exposure control, adjustment of the mA and/or kV according to patient size and/or use of iterative reconstruction technique. CONTRAST:  136m OMNIPAQUE IOHEXOL 350 MG/ML SOLN COMPARISON:  None Available. FINDINGS: CTA NECK FINDINGS Aortic arch: Standard 3 vessel aortic arch with mild atherosclerotic plaque. Widely patent arch vessel  origins. Right carotid system: Patent without  evidence of stenosis, dissection, or significant atherosclerosis. Tortuous distal cervical ICA. Left carotid system: Patent without evidence of stenosis, dissection, or significant atherosclerosis. Tortuous distal cervical ICA. Vertebral arteries: Patent without evidence of stenosis, dissection, or significant atherosclerosis. Codominant. Skeleton: Minor cervical spondylosis. Other neck: Partially calcified 1.7 cm right thyroid nodule; this has been evaluated on previous imaging (thyroid ultrasound and biopsy). No evidence of cervical lymphadenopathy. Upper chest: Clear lung apices. Review of the MIP images confirms the above findings CTA HEAD FINDINGS Anterior circulation: The internal carotid arteries are widely patent from skull base to carotid termini. ACAs and MCAs are patent without evidence of a proximal branch occlusion or significant proximal stenosis. No aneurysm is identified. Posterior circulation: The intracranial vertebral arteries are widely patent to the basilar. Patent PICA and SCA origins are seen bilaterally. The basilar artery is widely patent. There are small bilateral posterior communicating arteries, right larger than left. Both PCAs are patent without evidence of a significant proximal stenosis. No aneurysm is identified. Venous sinuses: Patent. Anatomic variants: None. Review of the MIP images confirms the above findings CT Brain Perfusion Findings: ASPECTS: 10 CBF (<30%) Volume: 0 mL Perfusion (Tmax>6.0s) volume: 0 mL Mismatch Volume: 0 mL Infarction Location: No infarct identified by CTP. These results were communicated to Dr. Rory Percy at 3:57 p.m. on 09/25/2022 by text page via the [facility] messaging system. IMPRESSION: 1. No large vessel occlusion or significant stenosis in the head or neck. 2. Negative CTP. 3.  Aortic Atherosclerosis (ICD10-I70.0). Electronically Signed   By: Logan Bores M.D.   On: 09/25/2022 16:06   CT HEAD CODE STROKE WO CONTRAST  Result Date: 09/25/2022 CLINICAL DATA:   Code stroke. Neuro deficit, acute, stroke suspected. Left-sided neglect. EXAM: CT HEAD WITHOUT CONTRAST TECHNIQUE: Contiguous axial images were obtained from the base of the skull through the vertex without intravenous contrast. RADIATION DOSE REDUCTION: This exam was performed according to the departmental dose-optimization program which includes automated exposure control, adjustment of the mA and/or kV according to patient size and/or use of iterative reconstruction technique. COMPARISON:  Head CT 08/20/2022 FINDINGS: Brain: There is no evidence of an acute infarct, intracranial hemorrhage, mass, midline shift, or extra-axial fluid collection. The ventricles and sulci are normal. A small cavum septum pellucidum et vergae is noted. 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 Vascular: No hyperdense vessel. Skull: No fracture or suspicious osseous lesion. Sinuses/Orbits: Visualized paranasal sinuses and mastoid air cells are clear. Unremarkable orbits. Other: None. ASPECTS [facility] Stroke Program Early CT Score) - Ganglionic level infarction (caudate, lentiform nuclei, internal capsule, insula, M1-M3 cortex): 7 - Supraganglionic infarction (M4-M6 cortex): 3 Total score (0-10 with 10 being normal): 10 IMPRESSION: No evidence of acute intracranial abnormality. ASPECTS of 10. These results were communicated to Dr. Rory Percy at 3:47 pm on 09/25/2022 by text page via the [facility] messaging system. Electronically Signed   By: Logan Bores M.D.   On: 09/25/2022 15:47      Subjective: Patient seen and examined at bedside.  Feels okay to be discharged today.  Denies any fever, nausea, vomiting, worsening shortness of breath.  Discharge Exam: Vitals:   09/30/22 0817 09/30/22 0919  BP:  (!) 129/98  Pulse:  89  Resp:  19  Temp:  97.7 F (36.5 C)  SpO2: 95% 100%    General: Pt is alert, awake, not in acute distress.  Currently on room air. Cardiovascular: rate controlled, S1/S2  + Respiratory:  bilateral decreased breath sounds at bases Abdominal: Soft, NT, ND, bowel sounds + Extremities: Trace lower extremity edema present; no cyanosis    The results of significant diagnostics from this hospitalization (including imaging, microbiology, ancillary and laboratory) are listed below for reference.     Microbiology: No results found for this or any previous visit (from the past 240 hour(s)).   Labs: BNP (last 3 results) No results for input(s): "BNP" in the last 8760 hours. Basic Metabolic Panel: Recent Labs  Lab 09/25/22 1530 09/25/22 1536 09/25/22 1537 09/26/22 0103 09/26/22 1823 09/27/22 0347 09/28/22 0712  NA 139 140 138 143  --  141 140  K 2.7* 2.7* 2.7* 2.8* 4.3 3.6 3.5  CL 103 101  --  109  --  110 105  CO2 26  --   --  26  --  23 24  GLUCOSE 457* 467*  --  70  --  230* 118*  BUN 12 11  --  8  --  9 11  CREATININE 0.99 0.80  --  0.84  --  0.84 0.84  CALCIUM 10.4*  --   --  9.7  --  9.4 10.4*  MG 1.6*  --   --  2.1  --   --   --   PHOS  --   --   --  2.6  --   --   --    Liver Function Tests: Recent Labs  Lab 09/25/22 1530 09/26/22 0103  AST 25 19  ALT 20 20  ALKPHOS 112 85  BILITOT 0.3 0.4  PROT 6.3* 5.8*  ALBUMIN 3.3* 3.2*   No results for input(s): "LIPASE", "AMYLASE" in the last 168 hours. No results for input(s): "AMMONIA" in the last 168 hours. CBC: Recent Labs  Lab 09/25/22 1530 09/25/22 1536 09/25/22 1537 09/26/22 0103  WBC 5.3  --   --  7.2  NEUTROABS 3.3  --   --   --   HGB 12.1 12.6 12.6 11.7*  HCT 36.8 37.0 37.0 36.1  MCV 93.2  --   --  92.1  PLT 208  --   --  234   Cardiac Enzymes: No results for input(s): "CKTOTAL", "CKMB", "CKMBINDEX", "TROPONINI" in the last 168 hours. BNP: Invalid input(s): "POCBNP" CBG: Recent Labs  Lab 09/29/22 0615 09/29/22 1219 09/29/22 1510 09/29/22 2255 09/30/22 0701  GLUCAP 237* 310* 178* 368* 192*   D-Dimer No results for input(s): "DDIMER" in the last 72 hours. Hgb  A1c No results for input(s): "HGBA1C" in the last 72 hours. Lipid Profile No results for input(s): "CHOL", "HDL", "LDLCALC", "TRIG", "CHOLHDL", "LDLDIRECT" in the last 72 hours. Thyroid function studies No results for input(s): "TSH", "T4TOTAL", "T3FREE", "THYROIDAB" in the last 72 hours.  Invalid input(s): "FREET3" Anemia work up No results for input(s): "VITAMINB12", "FOLATE", "FERRITIN", "TIBC", "IRON", "RETICCTPCT" in the last 72 hours. Urinalysis    Component Value Date/Time   COLORURINE YELLOW 09/25/2022 1801   APPEARANCEUR CLEAR 09/25/2022 1801   APPEARANCEUR Clear 10/30/2021 1100   LABSPEC >1.046 (H) 09/25/2022 1801   PHURINE 6.0 09/25/2022 1801   GLUCOSEU >=500 (A) 09/25/2022 1801   HGBUR NEGATIVE 09/25/2022 1801   BILIRUBINUR NEGATIVE 09/25/2022 1801   BILIRUBINUR N 09/19/2022 0755   BILIRUBINUR Negative 10/30/2021 1100   KETONESUR NEGATIVE 09/25/2022 1801   PROTEINUR 30 (A) 09/25/2022 1801   UROBILINOGEN 1.0 09/19/2022 0755   UROBILINOGEN 0.2 04/09/2015 1657   NITRITE NEGATIVE 09/25/2022 [place] 09/25/2022 1801  Sepsis Labs Recent Labs  Lab 09/25/22 1530 09/26/22 0103  WBC 5.3 7.2   Microbiology No results found for this or any previous visit (from the past 240 hour(s)).   Time coordinating discharge: 35 minutes  SIGNED:   Aline August, MD  [facility] 09/30/2022, 10:43 AM

## 2022-10-01 ENCOUNTER — Ambulatory Visit: Payer: Medicare Other | Admitting: Pulmonary Disease

## 2022-10-02 ENCOUNTER — Encounter: Payer: Medicare Other | Admitting: Physical Therapy

## 2022-10-06 ENCOUNTER — Ambulatory Visit: Payer: Medicare Other | Admitting: Physical Therapy

## 2022-10-06 NOTE — Therapy (Unsigned)
OUTPATIENT PHYSICAL THERAPY FEMALE PELVIC EVALUATION   Patient Name: [name] MRN: [id number] DOB:May 11, 1966, 56 y.o., female Today's Date: 10/07/2022   PT End of Session - 10/07/22 1538     Visit Number 1    Date for PT Re-Evaluation 12/16/22    Authorization Type Medicare    Authorization - Visit Number 1    Authorization - Number of Visits 10    PT Start Time 1330    PT Stop Time 1400    PT Time Calculation (min) 30 min    Activity Tolerance Patient tolerated treatment well    Behavior During Therapy [facility] for tasks assessed/performed             Past Medical History:  Diagnosis Date   Acute diverticulitis    Acute pain of right knee 10/14/2016   Acute renal failure (ARF) ([place]) 01/26/2019   Anemia    Arthritis    Chest pain    a. 12/2021 Cor CTA: LM nl, LAD <25%, LCX nl, RCA nl. Cor Ca2+ = 1.29.   Chronic back pain    Complicated by neuropathy. ->  On Neurontin and duloxetine along with Flexeril and Voltaren gel.  Also uses PRN tramadol.   COPD with asthma    On combination of albuterol Qvar and Symbicort   Depression    Diabetes mellitus without complication (HCC)    Diastolic dysfunction    a. 09/2015 Echo: EF 55-60%, GrI DD; b. 12/2021 Echo: EF 60-65%, no rwma, GrI DD, nl RV fxn. RVSP 28.52mHg. Mild MR.   Diverticulitis large intestine 05/12/2020   Dyspnea    GERD (gastroesophageal reflux disease)    Heart murmur    Hypertension    Palpitations    a. 12/2021 Zio: Predominantly sinus rhythm at 80 (38-136).  Frequent PACs (5.3%).  5 short runs of atrial tachycardia (longest 33 beats at 119 bpm.  Fastest 160 bpm).  Triggered events associated PACs and short runs of PAT.   Urinary incontinence    Pessary present   Past Surgical History:  Procedure Laterality Date   7-Day Zio Patch Monitor  12/2021   Predominant sinus rhythm.  Rate range 38-136 bpm with an average of 80 bpm (bradycardia only noted during hours of sleep.  5 Atrial Runs-fastest was 5 beats  at a max rate 162 bpm, longest was 17.2 seconds/33 beats at a rate of 119 bpm--not noted on patient trigger.  Frequent PACs noted (5.3%).  Rare PVCs.   ABDOMINAL HYSTERECTOMY     COLONOSCOPY WITH PROPOFOL N/A 10/02/2021   Procedure: COLONOSCOPY WITH PROPOFOL;  Surgeon: VLin Landsman MD;  [facility]  Service: Gastroenterology;  Laterality: N/A;   Coronary CT angiogram  01/03/2022   Thoracic aortic atherosclerosis.  Coronary Calcium Score 1.29.  Minimal proximal LAD calcification. (<25%).   ESOPHAGOGASTRODUODENOSCOPY N/A 10/02/2021   Procedure: ESOPHAGOGASTRODUODENOSCOPY (EGD);  Surgeon: VLin Landsman MD;  [facility];  Service: Gastroenterology;  Laterality: N/A;   TRANSTHORACIC ECHOCARDIOGRAM  09/29/2015   EF 55-60%.  No R WMA.  GR 1 DD.  Is also normal valves.  Normal study.   TRANSTHORACIC ECHOCARDIOGRAM  01/09/2022   EF 60 to 65%.  Mild to moderate LVH.  G1 DD.  Normal RV size and function.  Normal atrial sizes.  Normal valves.  NORMAL ECHO   TUBAL LIGATION     Patient Active Problem List   Diagnosis Date Noted   Vertigo 09/26/2022   Left-sided weakness 09/26/2022   DM (diabetes  mellitus), type 2 with complications ([place]) 07/37/1062   Thyroid cancer ([place]) 69/48/5462   Acute metabolic encephalopathy 70/35/0093   Lumbar strain 08/13/2022   Lipoma of left upper extremity    Exercise intolerance 12/26/2021   Heart murmur 12/26/2021   DOE (dyspnea on exertion) 12/26/2021   Palpitations 12/26/2021   Abdominal bloating    Polyp of colon    Multiple adenomatous polyps    Depression    Hyperlipidemia associated with type 2 diabetes mellitus ([place])    GERD (gastroesophageal reflux disease)    Influenza A 01/26/2019   Hypokalemia 06/12/2017   Marijuana use 06/12/2017   Diabetic neuropathy ([place]) 01/07/2016   Plantar fasciitis of left foot 11/26/2015   Back pain 10/10/2015   Asthma 09/29/2015   Essential hypertension 09/29/2015   DM (diabetes  mellitus) type II controlled, neurological manifestation ([place]) 09/29/2015    PCP: [name], MD  REFERRING PROVIDER: Jaquita Folds, MD  REFERRING DIAG:  N32.81 (ICD-10-CM) - Overactive bladder  N39.3 (ICD-10-CM) - SUI (stress urinary incontinence, female)  [id number] (ICD-10-CM) - Levator spasm    THERAPY DIAG:  Cramp and spasm  Lower abdominal pain  Rationale for Evaluation and Treatment Rehabilitation  ONSET DATE: 2022  SUBJECTIVE:                                                                                                                                                                                           SUBJECTIVE STATEMENT: Patient is wearing a pessary that Dr. Wannetta Sender placed in.    PAIN:  Are you having pain? Yes NPRS scale: 7/10 Pain location:  suprapubic   Pain type: pins Pain description: intermittent   Aggravating factors: urinate, standing or sitting too long, walk a long distance Relieving factors: walk, medication  PRECAUTIONS: None  WEIGHT BEARING RESTRICTIONS No  FALLS:  Has patient fallen in last 6 months? Yes. Number of falls 5 due to her knee giving out and not due to balance  LIVING ENVIRONMENT: Lives with: lives alone  OCCUPATION: none  PLOF: Independent  PATIENT GOALS reduce pain and prior function  PERTINENT HISTORY:  Abdominal hysterectomy; acute renal failure, diabetes, COPD  BOWEL MOVEMENT Pain with bowel movement: No and no issues with BM  URINATION Pain with urination: Yes, sits for 15 minutes for the urine stream to come. She then will urinate and have pain.  Fully empty bladder: Yes: if she sits for 20 minutes Stream:  medium Urgency: Yes:   Frequency: every 2 hours during the day, gets up 3-4 times at night Leakage: Urge to void, Walking to the bathroom, Coughing, Sneezing, Laughing, Lifting, and Bending forward  Pads: Yes: 2 pads  during the day and 1 long pad at night  INTERCOURSE not sexually  active  PROLAPSE Stage II anterior, Stage II posterior, Stage I apical prolapse    OBJECTIVE:   DIAGNOSTIC FINDINGS:  PVR of 56 ml was obtained by bladder scan. Pelvic floor strength III/V, Pelvic floor musculature: Right levator non-tender, Right obturator tender, Left levator non-tender, Left obturator tender   COGNITION:  Overall cognitive status: Within functional limits for tasks assessed     SENSATION:  Light touch: Appears intact  Proprioception: Appears intact  Patie              POSTURE: rounded shoulders, forward head, and flexed trunk    PELVIC ALIGNMENT:  LUMBARAROM/PROM  A/PROM A/PROM  eval  Flexion full  Extension Decreased by 25%  Right lateral flexion Decreased by 25%  Left lateral flexion full  Right rotation Decreased by 25%  Left rotation full   (Blank rows = not tested)  LOWER EXTREMITY ROM:  Passive ROM Right eval Left eval  Hip external rotation 65 45   (Blank rows = not tested)  LOWER EXTREMITY MMT:  MMT Right eval Left eval  Hip flexion  4-/5  Hip extension 4/5 4/5  Hip abduction 4/5 4/5  Hip adduction 4/5 4/5    PALPATION:   General  tenderness located on the scar, suprapubic bone and lower abdominal                External Perineal Exam no tenderness                             Internal Pelvic Floor tenderness located on the right obturator internist and levator ani  Patient confirms identification and approves PT to assess internal pelvic floor and treatment Yes  PELVIC MMT:   MMT eval  Vaginal 1/5  (Blank rows = not tested)        TONE: Increased on the right  PROLAPSE: Patient had pessary in  TODAY'S TREATMENT  EVAL finished the evaluation   HOME EXERCISE PROGRAM: none  ASSESSMENT:  CLINICAL IMPRESSION: Patient is a 56 y.o. female who was seen today for physical therapy evaluation and treatment for overactive bladder, stress incontinence, and levator spasm. Patient reports she has had this pain for 1  year with sudden onset. She is presently wearing a pessary  that Dr. Wannetta Sender placed in. Patient reports Her lower abdominal pain is 7/10 with urination, standing, sitting and walking too long. She has to sit on the commode for 15-20 minutes before she is able to urinate. Patient gets up 3-4 times at night and wears 1 pad and during the day she goes every 2 hours and wears 2 pads. She leaks urine with urge to void, Walking to the bathroom, coughing, sneezing, laughing, lifting, and bending forwardHer pelvic floor strength is 1/5. She has tenderness locate on the right levator ani and obturator internist, lower abdomen and suprapubic area.. She has decreased mobility of her scar suprapubically.  Patient will benefit from skilled therapy to improve pelvic floor coordination and reduce lower abdominal pain.    OBJECTIVE IMPAIRMENTS decreased activity tolerance, decreased coordination, decreased endurance, decreased mobility, decreased ROM, decreased strength, increased fascial restrictions, increased muscle spasms, and pain.   ACTIVITY LIMITATIONS carrying, lifting, bending, sitting, standing, squatting, sleeping, bed mobility, continence, and toileting  PARTICIPATION LIMITATIONS: cleaning, laundry, and shopping  PERSONAL FACTORS 3+ comorbidities: Abdominal hysterectomy; acute renal failure, diabetes,  COPD  are also affecting patient's functional outcome.   REHAB POTENTIAL: Good  CLINICAL DECISION MAKING: Evolving/moderate complexity  EVALUATION COMPLEXITY: Moderate   GOALS: Goals reviewed with patient? Yes  SHORT TERM GOALS: Target date: 11/04/2022  Patient independent with manual work to the lower abdomen to reduce her pain.  Baseline: Goal status: INITIAL  2.  Patient independent with initial HEP for hip stretches.  Baseline:  Goal status: INITIAL  3.  Patient understands how to engage her lower abdominals during transitional movements.  Baseline:  Goal status: INITIAL   LONG  TERM GOALS: Target date: 12/30/2022   Patient is independent with advanced HEP for core engagement and pelvic floor strength Baseline:  Goal status: INITIAL  2.  Patient is able to urinate within 5 minutes of sitting on the commode due to relaxation of the pelvic floor musclels.  Baseline:  Goal status: INITIAL  3.  Patient pain level after she urinates decreased </= 1-2 due to the ability for the pelvic floor to relax.  Baseline:  Goal status: INITIAL  4.  Patient reports she leaks 2 times per week and wears 1 pad due to her pelvic floor strength >/= 3/5 holding for 5-10 seconds.  Baseline:  Goal status: INITIAL  5.  Patient is able to return to a walking program due to her lower abdominal pain decreased </= 1-2/10 and increased in hip strength.  Baseline:  Goal status: INITIAL   PLAN: PT FREQUENCY: 1x/week  PT DURATION: 12 weeks  PLANNED INTERVENTIONS: Therapeutic exercises, Therapeutic activity, Neuromuscular re-education, Patient/Family education, Self Care, Joint mobilization, Dry Needling, Electrical stimulation, Cryotherapy, Moist heat, Ultrasound, Biofeedback, and Manual therapy  PLAN FOR NEXT SESSION: Manual work to abdomen and educated patient, hip stretches, manual work to the right levator and obturator, work on diaphragmatic breathing   Earlie Counts, PT 10/07/22 3:58 PM

## 2022-10-07 ENCOUNTER — Encounter: Payer: Medicare Other | Admitting: Physical Therapy

## 2022-10-07 ENCOUNTER — Other Ambulatory Visit: Payer: Self-pay

## 2022-10-07 ENCOUNTER — Encounter: Payer: Self-pay | Admitting: Physical Therapy

## 2022-10-07 ENCOUNTER — Encounter: Payer: BLUE CROSS/BLUE SHIELD | Admitting: Physical Therapy

## 2022-10-07 DIAGNOSIS — R1030 Lower abdominal pain, unspecified: Secondary | ICD-10-CM | POA: Insufficient documentation

## 2022-10-07 DIAGNOSIS — M62838 Other muscle spasm: Secondary | ICD-10-CM | POA: Diagnosis not present

## 2022-10-07 DIAGNOSIS — N3281 Overactive bladder: Secondary | ICD-10-CM | POA: Insufficient documentation

## 2022-10-07 DIAGNOSIS — N393 Stress incontinence (female) (male): Secondary | ICD-10-CM | POA: Diagnosis not present

## 2022-10-09 ENCOUNTER — Encounter: Payer: BLUE CROSS/BLUE SHIELD | Admitting: Obstetrics and Gynecology

## 2022-10-09 IMAGING — CR DG RIBS W/ CHEST 3+V*R*
3 series · 3 of 3 positions shown · non-contrast
Comparison: 01/01/2022

CLINICAL DATA: Right rib pain

EXAM:
RIGHT RIBS AND CHEST - 3+ VIEW

[chest pa]
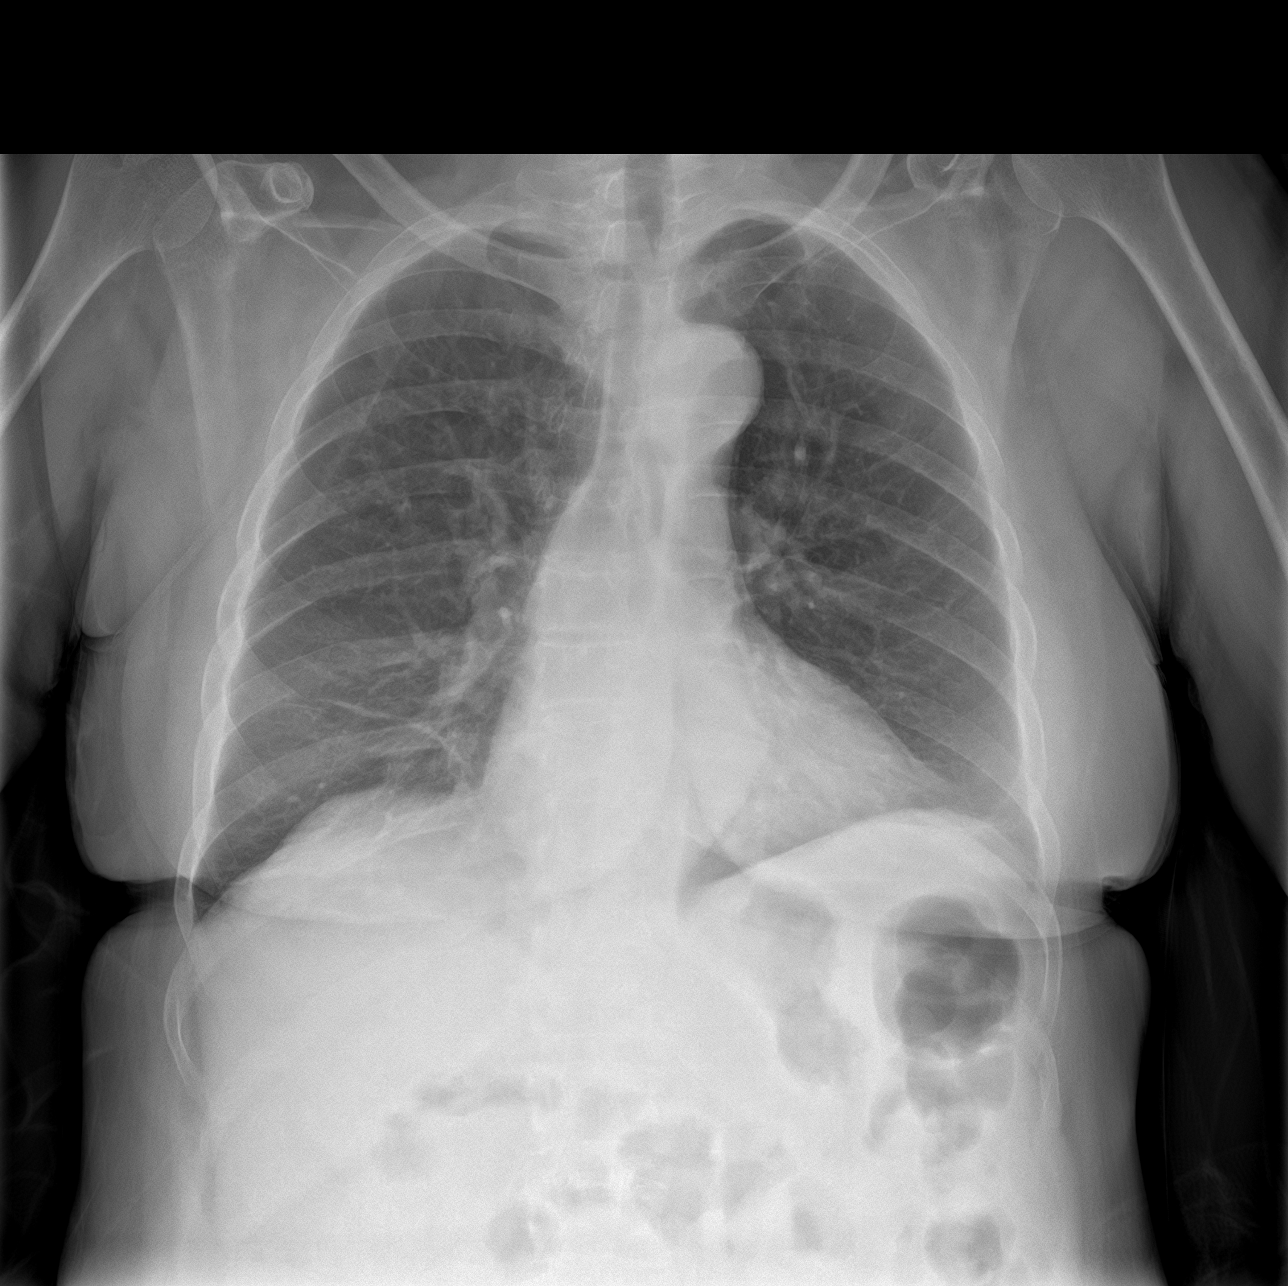

[rib pa]
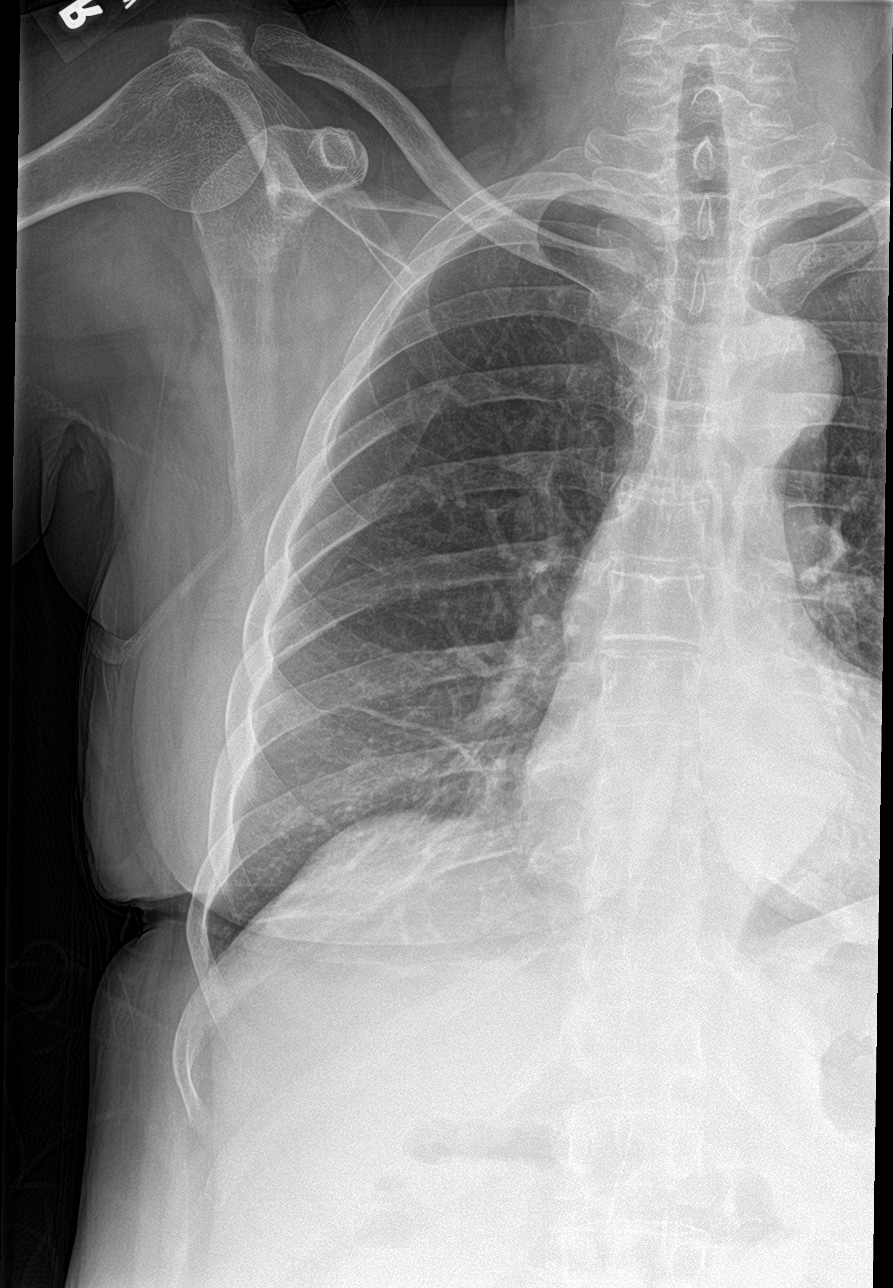

[rib pa obl]
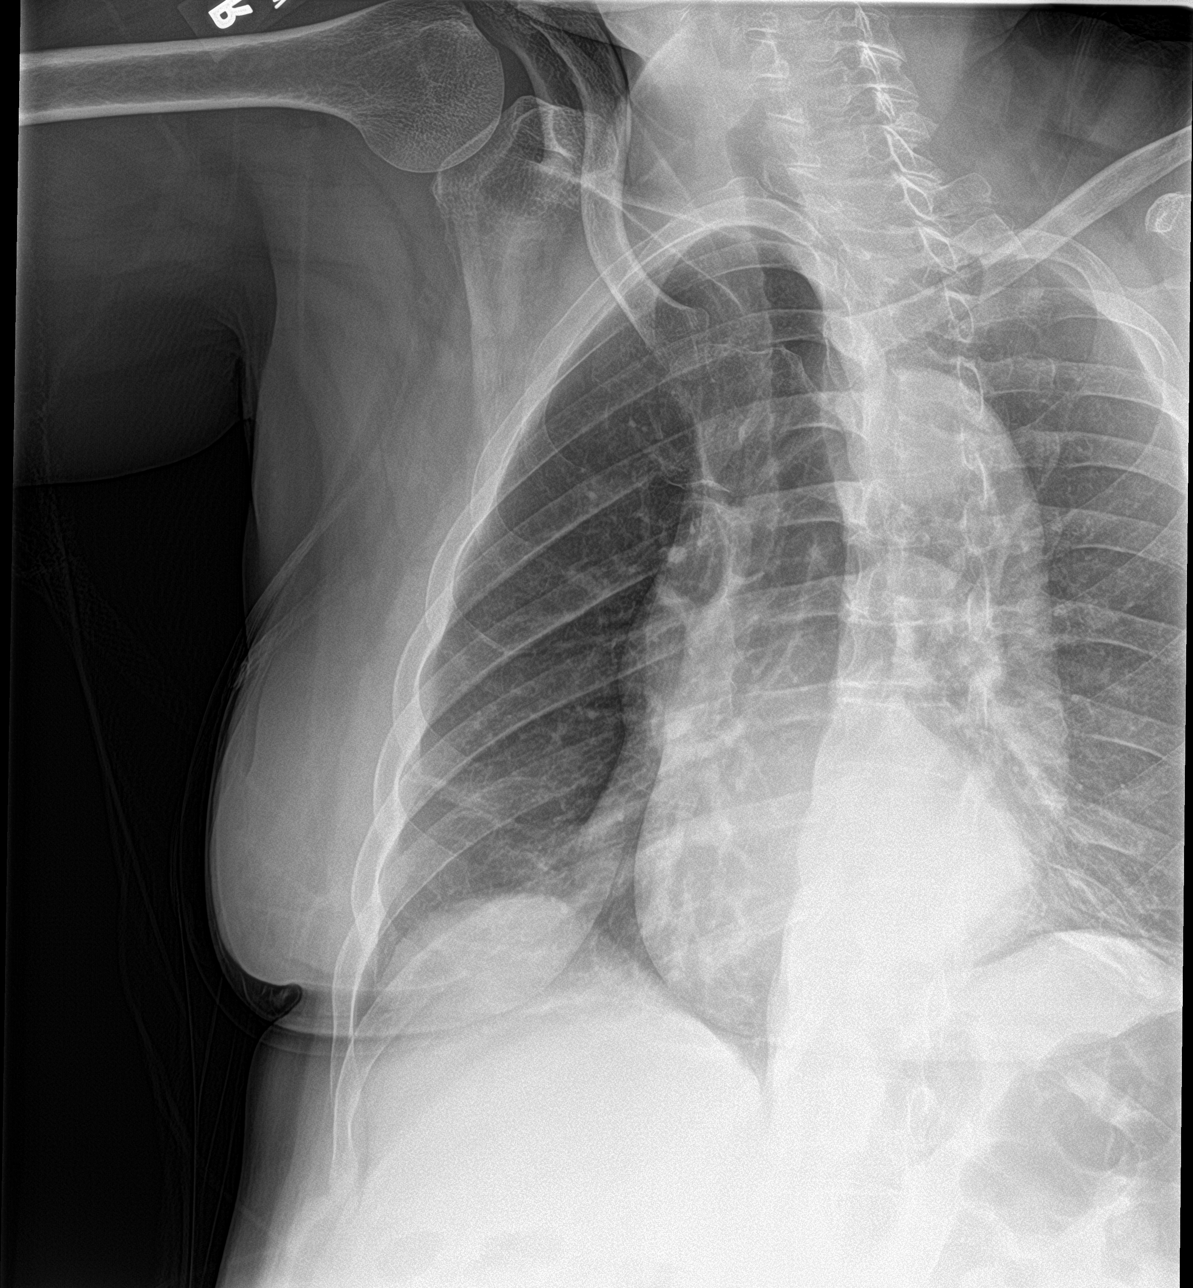

[3 of 3 positions shown; findings below may reference images not displayed]

FINDINGS: Linear subsegmental atelectasis at the right lung base. Otherwise no
confluent opacities or effusions. Heart is normal size. No
pneumothorax. No acute bony abnormality. No visualized displaced rib
fracture.
IMPRESSION: No visible rib fracture.

Right base atelectasis.

## 2022-10-16 ENCOUNTER — Telehealth: Payer: Self-pay | Admitting: Obstetrics and Gynecology

## 2022-10-16 ENCOUNTER — Encounter: Payer: BLUE CROSS/BLUE SHIELD | Admitting: Physical Therapy

## 2022-10-23 ENCOUNTER — Encounter: Payer: Self-pay | Admitting: Physical Therapy

## 2022-10-23 ENCOUNTER — Ambulatory Visit (INDEPENDENT_AMBULATORY_CARE_PROVIDER_SITE_OTHER): Payer: Medicare Other | Admitting: Pulmonary Disease

## 2022-10-23 ENCOUNTER — Encounter: Payer: Medicare Other | Attending: Obstetrics and Gynecology | Admitting: Physical Therapy

## 2022-10-23 ENCOUNTER — Encounter: Payer: Self-pay | Admitting: Pulmonary Disease

## 2022-10-23 VITALS — BP 120/80 | HR 71 | Temp 97.7°F | Ht <= 58 in | Wt 152.0 lb

## 2022-10-23 DIAGNOSIS — R252 Cramp and spasm: Secondary | ICD-10-CM | POA: Diagnosis not present

## 2022-10-23 DIAGNOSIS — R0602 Shortness of breath: Secondary | ICD-10-CM | POA: Diagnosis not present

## 2022-10-23 DIAGNOSIS — J4530 Mild persistent asthma, uncomplicated: Secondary | ICD-10-CM

## 2022-10-23 DIAGNOSIS — R1030 Lower abdominal pain, unspecified: Secondary | ICD-10-CM | POA: Diagnosis present

## 2022-10-23 NOTE — Progress Notes (Signed)
Subjective:    Patient ID: [name], female    DOB: 01/11/1966, 56 y.o.   MRN: [id number] Patient Care Team: Sherrie Mustache, MD as PCP - General (Internal Medicine) Marykay Lex, MD as PCP - Cardiology (Cardiology) Salena Saner, MD as Consulting Physician (Pulmonary Disease)  Chief Complaint  Patient presents with   Follow-up    SOB with exertion. No wheezing. Cough with greenish sputum.    HPI Patient is a 56 year old former smoker (quit 2006, 30 PY) who presents for follow-up of her visit of 01 January 2022.  At that time she presented for a long overdue reassessment of asthma.  She has been maintained on Symbicort 160/4.5, 2 inhalations twice a day.  She notes compliance with the medication.  She also uses albuterol for rescue and sometimes uses it once daily.  Her asthma follow-up has been erratic.  She was hospitalized on 25 September 2022 through 30 September 2022 at [facility] with acute metabolic encephalopathy.  This was not related to her asthma.  She apparently is being worked up for thyroid carcinoma.  We discussed her pulmonary function testing again.  Testing was fairly benign.  She had evidence of small airways component consistent with her asthma overall the testing was normal.  She has not had any recent wheezing.  She has dyspnea on heavy exertion only.  This has been present for a number of years.  No fevers, chills or sweats.  Cough is rarely productive when it is is pale greenish sputum.  This has been a chronic finding.  She does not have daily cough issues.   DATA 06/08/2017 PFTs: Noted patient gave marginal effort according to technician.  FEV1 1.82 L or 88% predicted, FVC 2.23 L or 87% predicted, FEV1/FVC 81%, no bronchodilator response lung volumes showed air trapping with no hyperinflation diffusion capacity mildly reduced.  Minimal obstructive airways disease with minimal diffusion defect, no bronchodilator response. 01/02/2022 PFTs: FEV1 1.95 L or 85%  predicted, FVC 2.46 L or 83% predicted, FEV1/FVC 80%.  Lung volumes overall normal with mild air trapping noted.  No significant bronchodilator response.  There is small airways component.  Diffusion capacity normal.  Review of Systems A 10 point review of systems was performed and it is as noted above otherwise negative.  Patient Active Problem List   Diagnosis Date Noted   Vertigo 09/26/2022   Left-sided weakness 09/26/2022   DM (diabetes mellitus), type 2 with complications (HCC) 09/26/2022   Thyroid cancer (HCC) 09/26/2022   Acute metabolic encephalopathy 09/25/2022   Lumbar strain 08/13/2022   Lipoma of left upper extremity    Exercise intolerance 12/26/2021   Heart murmur 12/26/2021   DOE (dyspnea on exertion) 12/26/2021   Palpitations 12/26/2021   Abdominal bloating    Polyp of colon    Multiple adenomatous polyps    Depression    Hyperlipidemia associated with type 2 diabetes mellitus (HCC)    GERD (gastroesophageal reflux disease)    Influenza A 01/26/2019   Hypokalemia 06/12/2017   Marijuana use 06/12/2017   Diabetic neuropathy (HCC) 01/07/2016   Plantar fasciitis of left foot 11/26/2015   Back pain 10/10/2015   Asthma 09/29/2015   Essential hypertension 09/29/2015   DM (diabetes mellitus) type II controlled, neurological manifestation (HCC) 09/29/2015   Social History   Tobacco Use   Smoking status: Former    Packs/day: 3.00    Years: 10.00    Total pack years: 30.00    Types: Cigarettes  Quit date: 12/22/2004    Years since quitting: 17.8   Smokeless tobacco: Never  Substance Use Topics   Alcohol use: No   No Known Allergies  Current Meds  Medication Sig   albuterol (PROVENTIL) (2.5 MG/3ML) 0.083% nebulizer solution Take 3 mLs (2.5 mg total) by nebulization every 2 (two) hours as needed for wheezing or shortness of breath.   albuterol (VENTOLIN HFA) 108 (90 Base) MCG/ACT inhaler Inhale 2 puffs into the lungs every 6 (six) hours as needed for wheezing  or shortness of breath.   amLODipine (NORVASC) 10 MG tablet Take 1 tablet (10 mg total) by mouth daily.   atorvastatin (LIPITOR) 10 MG tablet Take 10 mg by mouth daily.   budesonide-formoterol (SYMBICORT) 160-4.5 MCG/ACT inhaler Inhale 2 puffs into the lungs 2 (two) times daily.   Cholecalciferol (VITAMIN D3) 1.25 MG (50000 UT) CAPS Take 1 capsule by mouth once a week.   diclofenac sodium (VOLTAREN) 1 % GEL Apply 2 g topically 4 (four) times daily as needed (pain).   ibuprofen (ADVIL) 600 MG tablet Take 1 tablet (600 mg total) by mouth every 6 (six) hours as needed. (Patient taking differently: Take 800 mg by mouth every 6 (six) hours as needed for mild pain or moderate pain.)   insulin glargine-yfgn (SEMGLEE) 100 UNIT/ML injection Inject 0.2 mLs (20 Units total) into the skin daily.   loratadine (CLARITIN) 10 MG tablet Take 1 tablet (10 mg total) by mouth daily.   losartan (COZAAR) 100 MG tablet Take 1 tablet (100 mg total) by mouth daily.   meclizine (ANTIVERT) 25 MG tablet Take 1 tablet (25 mg total) by mouth 3 (three) times daily as needed for dizziness.   metFORMIN (GLUCOPHAGE) 1000 MG tablet Take 1 tablet (1,000 mg total) by mouth 2 (two) times daily with a meal. See discharge instructions for how to take medicine at first   methocarbamol (ROBAXIN) 500 MG tablet TAKE 1 TABLET BY MOUTH EVERY 6 HOURS AS NEEDED FOR MUSCLE SPASMS   metoprolol tartrate (LOPRESSOR) 50 MG tablet Take 2 tablets (100 mg total) by mouth 2 (two) times daily.   omeprazole (PRILOSEC) 40 MG capsule Take 1 capsule (40 mg total) by mouth daily.   Semaglutide (RYBELSUS) 7 MG TABS Take by mouth daily.   sertraline (ZOLOFT) 50 MG tablet Take 1.5 tablets (75 mg total) by mouth every morning.   Immunization History  Administered Date(s) Administered   Influenza,inj,Quad PF,6+ Mos 09/29/2015   Moderna Sars-Covid-2 Vaccination 03/19/2021, 04/18/2021   Pneumococcal Polysaccharide-23 09/29/2015      Objective:   Physical  Exam BP 120/80 (BP Location: Left Arm, Cuff Size: Normal)   Pulse 71   Temp 97.7 F (36.5 C)   Ht 4\' 10"  (1.473 m)   Wt 152 lb (68.9 kg)   SpO2 98%   BMI 31.77 kg/m   SpO2: 98 % O2 Device: None (Room air)  GENERAL: Overweight woman, no acute distress, fully ambulatory, no conversational dyspnea. HEAD: Normocephalic, atraumatic.  EYES: Pupils equal, round, reactive to light.  No scleral icterus. Exophthalmos noted. MOUTH: Nose/mouth/throat not examined due to masking requirements for COVID 19. NECK: Supple. No thyromegaly. Trachea midline. No JVD.  No adenopathy. PULMONARY: Good air entry bilaterally.  No adventitious sounds. CARDIOVASCULAR: S1 and S2. Regular rate and rhythm.  No rubs, murmurs or gallops heard. ABDOMEN: Benign. MUSCULOSKELETAL: No joint deformity, no clubbing, no edema.  NEUROLOGIC: No focal deficit, no gait disturbance, speech is fluent. SKIN: Intact,warm,dry. PSYCH: Anxious mood, speech pressured.  Assessment & Plan:     ICD-10-CM   1. Mild persistent asthma without complication  J45.30    Continue Symbicort 160/4.5, 2 inhalations twice a day Continue as needed albuterol    2. Shortness of breath  R06.02    Out of proportion to PFTs Suspect element of deconditioning     Will see the patient in follow-up in 3 months time she is to contact us prior to that time should any new difficulties arise.  Gailen Shelter, MD Advanced Bronchoscopy PCCM [facility]    *This note was dictated using voice recognition software/Dragon.  Despite best efforts to proofread, errors can occur which can change the meaning. Any transcriptional errors that result from this process are unintentional and may not be fully corrected at the time of dictation.

## 2022-10-23 NOTE — Patient Instructions (Signed)
Your lungs sounded good today.  Continue Symbicort 2 puffs twice a day.  Continue use of albuterol as needed for shortness of breath, you may use it up to 4 times a day if needed.  We will see you in follow-up in 3 months time call sooner should any new problems arise

## 2022-10-23 NOTE — Therapy (Signed)
OUTPATIENT PHYSICAL THERAPY TREATMENT NOTE   Patient Name: [name] MRN: [id number] DOB:30-Dec-1965, 56 y.o., female Today's Date: 10/23/2022  PCP:  [name], MD  REFERRING PROVIDER: Jaquita Folds, MD   END OF SESSION:   PT End of Session - 10/23/22 1303     Visit Number 2    Date for PT Re-Evaluation 12/16/22    Authorization Type Medicare    Authorization - Visit Number 2    Authorization - Number of Visits 10    PT Start Time 1300    PT Stop Time 1330    PT Time Calculation (min) 30 min    Activity Tolerance Patient tolerated treatment well    Behavior During Therapy [facility] for tasks assessed/performed             Past Medical History:  Diagnosis Date   Acute diverticulitis    Acute pain of right knee 10/14/2016   Acute renal failure (ARF) ([place]) 01/26/2019   Anemia    Arthritis    Chest pain    a. 12/2021 Cor CTA: LM nl, LAD <25%, LCX nl, RCA nl. Cor Ca2+ = 1.29.   Chronic back pain    Complicated by neuropathy. ->  On Neurontin and duloxetine along with Flexeril and Voltaren gel.  Also uses PRN tramadol.   COPD with asthma    On combination of albuterol Qvar and Symbicort   Depression    Diabetes mellitus without complication (HCC)    Diastolic dysfunction    a. 09/2015 Echo: EF 55-60%, GrI DD; b. 12/2021 Echo: EF 60-65%, no rwma, GrI DD, nl RV fxn. RVSP 28.24mHg. Mild MR.   Diverticulitis large intestine 05/12/2020   Dyspnea    GERD (gastroesophageal reflux disease)    Heart murmur    Hypertension    Palpitations    a. 12/2021 Zio: Predominantly sinus rhythm at 80 (38-136).  Frequent PACs (5.3%).  5 short runs of atrial tachycardia (longest 33 beats at 119 bpm.  Fastest 160 bpm).  Triggered events associated PACs and short runs of PAT.   Urinary incontinence    Pessary present   Past Surgical History:  Procedure Laterality Date   7-Day Zio Patch Monitor  12/2021   Predominant sinus rhythm.  Rate range 38-136 bpm with an average of 80 bpm  (bradycardia only noted during hours of sleep.  5 Atrial Runs-fastest was 5 beats at a max rate 162 bpm, longest was 17.2 seconds/33 beats at a rate of 119 bpm--not noted on patient trigger.  Frequent PACs noted (5.3%).  Rare PVCs.   ABDOMINAL HYSTERECTOMY     COLONOSCOPY WITH PROPOFOL N/A 10/02/2021   Procedure: COLONOSCOPY WITH PROPOFOL;  Surgeon: VLin Landsman MD;  [facility]  Service: Gastroenterology;  Laterality: N/A;   Coronary CT angiogram  01/03/2022   Thoracic aortic atherosclerosis.  Coronary Calcium Score 1.29.  Minimal proximal LAD calcification. (<25%).   ESOPHAGOGASTRODUODENOSCOPY N/A 10/02/2021   Procedure: ESOPHAGOGASTRODUODENOSCOPY (EGD);  Surgeon: VLin Landsman MD;  [facility];  Service: Gastroenterology;  Laterality: N/A;   TRANSTHORACIC ECHOCARDIOGRAM  09/29/2015   EF 55-60%.  No R WMA.  GR 1 DD.  Is also normal valves.  Normal study.   TRANSTHORACIC ECHOCARDIOGRAM  01/09/2022   EF 60 to 65%.  Mild to moderate LVH.  G1 DD.  Normal RV size and function.  Normal atrial sizes.  Normal valves.  NORMAL ECHO   TUBAL LIGATION     Patient Active Problem List  Diagnosis Date Noted   Vertigo 09/26/2022   Left-sided weakness 09/26/2022   DM (diabetes mellitus), type 2 with complications ([place]) 53/61/4431   Thyroid cancer ([place]) 54/00/8676   Acute metabolic encephalopathy 19/50/9326   Lumbar strain 08/13/2022   Lipoma of left upper extremity    Exercise intolerance 12/26/2021   Heart murmur 12/26/2021   DOE (dyspnea on exertion) 12/26/2021   Palpitations 12/26/2021   Abdominal bloating    Polyp of colon    Multiple adenomatous polyps    Depression    Hyperlipidemia associated with type 2 diabetes mellitus (HCC)    GERD (gastroesophageal reflux disease)    Influenza A 01/26/2019   Hypokalemia 06/12/2017   Marijuana use 06/12/2017   Diabetic neuropathy ([place]) 01/07/2016   Plantar fasciitis of left foot 11/26/2015   Back pain  10/10/2015   Asthma 09/29/2015   Essential hypertension 09/29/2015   DM (diabetes mellitus) type II controlled, neurological manifestation ([place]) 09/29/2015   REFERRING DIAG:  N32.81 (ICD-10-CM) - Overactive bladder  N39.3 (ICD-10-CM) - SUI (stress urinary incontinence, female)  M62.838 (ICD-10-CM) - Levator spasm      THERAPY DIAG:  Cramp and spasm   Lower abdominal pain   Rationale for Evaluation and Treatment Rehabilitation   ONSET DATE: 2022   SUBJECTIVE:                                                                                                                                                                                            SUBJECTIVE STATEMENT: I am having burning and itching when I burn. Lower abdominal pain is 8/10. I need my pessary to be cleaned. I have a white discharge on my underwear.      PAIN:  Are you having pain? Yes NPRS scale: 8/10 Pain location:  suprapubic    Pain type: pins Pain description: intermittent    Aggravating factors: urinate, standing or sitting too long, walk a long distance Relieving factors: walk, medication   PRECAUTIONS: None   WEIGHT BEARING RESTRICTIONS No   FALLS:  Has patient fallen in last 6 months? Yes. Number of falls 5 due to her knee giving out and not due to balance   LIVING ENVIRONMENT: Lives with: lives alone   OCCUPATION: none   PLOF: Independent   PATIENT GOALS reduce pain and prior function   PERTINENT HISTORY:  Abdominal hysterectomy; acute renal failure, diabetes, COPD   BOWEL MOVEMENT Pain with bowel movement: No and no issues with BM   URINATION Pain with urination: Yes, sits for 15 minutes for the urine stream to come. She then will urinate and have pain.  Fully empty  bladder: Yes: if she sits for 20 minutes Stream:  medium Urgency: Yes:   Frequency: every 2 hours during the day, gets up 3-4 times at night Leakage: Urge to void, Walking to the bathroom, Coughing, Sneezing, Laughing,  Lifting, and Bending forward Pads: Yes: 2 pads  during the day and 1 long pad at night   INTERCOURSE not sexually active   PROLAPSE Stage II anterior, Stage II posterior, Stage I apical prolapse       OBJECTIVE:    DIAGNOSTIC FINDINGS:  PVR of 56 ml was obtained by bladder scan. Pelvic floor strength III/V, Pelvic floor musculature: Right levator non-tender, Right obturator tender, Left levator non-tender, Left obturator tender     COGNITION:            Overall cognitive status: Within functional limits for tasks assessed                          SENSATION:            Light touch: Appears intact            Proprioception: Appears intact   Patie              POSTURE: rounded shoulders, forward head, and flexed trunk                PELVIC ALIGNMENT:   LUMBARAROM/PROM   A/PROM A/PROM  eval  Flexion full  Extension Decreased by 25%  Right lateral flexion Decreased by 25%  Left lateral flexion full  Right rotation Decreased by 25%  Left rotation full   (Blank rows = not tested)   LOWER EXTREMITY ROM:   Passive ROM Right eval Left eval  Hip external rotation 65 45   (Blank rows = not tested)   LOWER EXTREMITY MMT:   MMT Right eval Left eval  Hip flexion   4-/5  Hip extension 4/5 4/5  Hip abduction 4/5 4/5  Hip adduction 4/5 4/5     PALPATION:   General  tenderness located on the scar, suprapubic bone and lower abdominal                 External Perineal Exam no tenderness                             Internal Pelvic Floor tenderness located on the right obturator internist and levator ani   Patient confirms identification and approves PT to assess internal pelvic floor and treatment Yes   PELVIC MMT:   MMT eval  Vaginal 1/5  (Blank rows = not tested)         TONE: Increased on the right   PROLAPSE: Patient had pessary in   TODAY'S TREATMENT  10/23/2022 Manual: Soft tissue mobilization: to the lower abdomen to work through the tenderness,  and soft tissue restrictions Scar tissue mobilization:to lower abdomen to work through restrictions and educated patient on how to perform manual work to the scar.     PATIENT EDUCATION: 10/23/2022 Education details: educated patient on how to perform lower abdominal massage and scar massage Person educated: Patient Education method: Customer service manager Education comprehension: verbalized understanding and returned demonstration      HOME EXERCISE PROGRAM: none   ASSESSMENT:   CLINICAL IMPRESSION: Patient is a 56 y.o. female who was seen today for physical therapy treatment for overactive bladder, stress incontinence, and levator spasm. Patient came into  the therapy with complaints of lower abdomen pain, burning and itchiness with urination. Patient will be going to her primary care doctor to see if she has a UTI. Patient lower abdominal pain decreased from 8/10 to 2/10 with manual work.   Patient will benefit from skilled therapy to improve pelvic floor coordination and reduce lower abdominal pain.      OBJECTIVE IMPAIRMENTS decreased activity tolerance, decreased coordination, decreased endurance, decreased mobility, decreased ROM, decreased strength, increased fascial restrictions, increased muscle spasms, and pain.    ACTIVITY LIMITATIONS carrying, lifting, bending, sitting, standing, squatting, sleeping, bed mobility, continence, and toileting   PARTICIPATION LIMITATIONS: cleaning, laundry, and shopping   PERSONAL FACTORS 3+ comorbidities: Abdominal hysterectomy; acute renal failure, diabetes, COPD  are also affecting patient's functional outcome.    REHAB POTENTIAL: Good   CLINICAL DECISION MAKING: Evolving/moderate complexity   EVALUATION COMPLEXITY: Moderate     GOALS: Goals reviewed with patient? Yes   SHORT TERM GOALS: Target date: 11/04/2022   Patient independent with manual work to the lower abdomen to reduce her pain.  Baseline: Goal status: INITIAL    2.  Patient independent with initial HEP for hip stretches.  Baseline:  Goal status: INITIAL   3.  Patient understands how to engage her lower abdominals during transitional movements.  Baseline:  Goal status: INITIAL     LONG TERM GOALS: Target date: 12/30/2022    Patient is independent with advanced HEP for core engagement and pelvic floor strength Baseline:  Goal status: INITIAL   2.  Patient is able to urinate within 5 minutes of sitting on the commode due to relaxation of the pelvic floor musclels.  Baseline:  Goal status: INITIAL   3.  Patient pain level after she urinates decreased </= 1-2 due to the ability for the pelvic floor to relax.  Baseline:  Goal status: INITIAL   4.  Patient reports she leaks 2 times per week and wears 1 pad due to her pelvic floor strength >/= 3/5 holding for 5-10 seconds.  Baseline:  Goal status: INITIAL   5.  Patient is able to return to a walking program due to her lower abdominal pain decreased </= 1-2/10 and increased in hip strength.  Baseline:  Goal status: INITIAL     PLAN: PT FREQUENCY: 1x/week   PT DURATION: 12 weeks   PLANNED INTERVENTIONS: Therapeutic exercises, Therapeutic activity, Neuromuscular re-education, Patient/Family education, Self Care, Joint mobilization, Dry Needling, Electrical stimulation, Cryotherapy, Moist heat, Ultrasound, Biofeedback, and Manual therapy   PLAN FOR NEXT SESSION: Manual work to abdomen and educated patient, hip stretches, manual work to the right levator and obturator, work on diaphragmatic breathing Earlie Counts, PT 10/23/22 1:08 PM

## 2022-10-24 ENCOUNTER — Other Ambulatory Visit: Payer: Self-pay | Admitting: Otolaryngology

## 2022-10-24 ENCOUNTER — Ambulatory Visit
Admission: RE | Admit: 2022-10-24 | Discharge: 2022-10-24 | Disposition: A | Discharge status: Home / Self Care | Payer: Medicare Other | Source: Ambulatory Visit | Attending: Otolaryngology | Admitting: Otolaryngology

## 2022-10-24 DIAGNOSIS — C73 Malignant neoplasm of thyroid gland: Secondary | ICD-10-CM

## 2022-10-24 MED ORDER — IOPAMIDOL (ISOVUE-370) INJECTION 76%
75.0000 mL | Freq: Once | INTRAVENOUS | Status: AC | PRN
  Administered 2022-10-24: 75 mL via INTRAVENOUS

## 2022-11-04 ENCOUNTER — Encounter
Admission: RE | Admit: 2022-11-04 | Discharge: 2022-11-04 | Disposition: A | Discharge status: Home / Self Care | Payer: Medicare Other | Source: Ambulatory Visit | Attending: Otolaryngology | Admitting: Otolaryngology

## 2022-11-04 VITALS — Ht 58.5 in | Wt 154.0 lb

## 2022-11-04 DIAGNOSIS — E118 Type 2 diabetes mellitus with unspecified complications: Secondary | ICD-10-CM

## 2022-11-04 DIAGNOSIS — Z01812 Encounter for preprocedural laboratory examination: Secondary | ICD-10-CM

## 2022-11-04 HISTORY — DX: Polyp of colon: K63.5

## 2022-11-04 HISTORY — DX: Malignant neoplasm of thyroid gland: C73

## 2022-11-04 HISTORY — DX: Type 2 diabetes mellitus with diabetic neuropathy, unspecified: E11.40

## 2022-11-04 HISTORY — DX: Hyperlipidemia, unspecified: E78.5

## 2022-11-04 HISTORY — DX: Benign lipomatous neoplasm of skin and subcutaneous tissue of left arm: D17.22

## 2022-11-04 HISTORY — DX: Metabolic encephalopathy: G93.41

## 2022-11-04 NOTE — Patient Instructions (Addendum)
Your procedure is scheduled on: Friday, November 17 Report to the Registration Desk on the 1st floor of the [facility]. To find out your arrival time, please call [phone] between 1PM - 3PM on: Thursday, November 16 If your arrival time is 6:00 am, do not arrive prior to that time as the [facility] entrance doors do not open until 6:00 am.  REMEMBER: Instructions that are not followed completely may result in serious medical risk, up to and including death; or upon the discretion of your surgeon and anesthesiologist your surgery may need to be rescheduled.  Do not eat food after midnight the night before surgery.  No gum chewing, lozengers or hard candies.  You may however, drink water up to 2 hours before you are scheduled to arrive for your surgery. Do not drink anything within 2 hours of your scheduled arrival time.  TAKE THESE MEDICATIONS THE MORNING OF SURGERY WITH A SIP OF WATER:  Albuterol nebulizer Amlodipine Atorvastatin Symbicort inhaler Gabapentin Metoprolol Omeprazole - (take one the night before and one on the morning of surgery - helps to prevent nausea after surgery.) Sertraline (zoloft)  Use inhalers on the day of surgery and bring your albuterol inhaler to the hospital.  Metformin - hold 2 days prior to surgery. Last day to take is November 14. Resume AFTER surgery.  No insulin on the day of surgery.  One week prior to surgery: starting today, November 14 Stop Anti-inflammatories (NSAIDS) such as Advil, Aleve, Ibuprofen, Motrin, Naproxen, Naprosyn and Aspirin based products such as Excedrin, Goodys Powder, BC Powder. Stop ANY OVER THE COUNTER supplements until after surgery. You may however, continue to take Tylenol if needed for pain up until the day of surgery.  No Alcohol for 24 hours before or after surgery.  No Smoking including e-cigarettes for 24 hours prior to surgery.  No chewable tobacco products for at least 6 hours prior to surgery.  No  nicotine patches on the day of surgery.  Do not use any "recreational" drugs for at least a week prior to your surgery.  Please be advised that the combination of cocaine and anesthesia may have negative outcomes, up to and including death. If you test positive for cocaine, your surgery will be cancelled.  On the morning of surgery brush your teeth with toothpaste and water, you may rinse your mouth with mouthwash if you wish. Do not swallow any toothpaste or mouthwash.  Use CHG Soap as directed on instruction sheet.  Do not wear jewelry, make-up, hairpins, clips or nail polish.  Do not wear lotions, powders, or perfumes.   Do not shave body from the neck down 48 hours prior to surgery just in case you cut yourself which could leave a site for infection.  Also, freshly shaved skin may become irritated if using the CHG soap.  Contact lenses, hearing aids and dentures may not be worn into surgery.  Do not bring valuables to the hospital. [facility] is not responsible for any missing/lost belongings or valuables.   Notify your doctor if there is any change in your medical condition (cold, fever, infection).  Wear comfortable clothing (specific to your surgery type) to the hospital.  After surgery, you can help prevent lung complications by doing breathing exercises.  Take deep breaths and cough every 1-2 hours. Your doctor may order a device called an Incentive Spirometer to help you take deep breaths.  If you are being admitted to the hospital overnight, leave your suitcase in the  car. After surgery it may be brought to your room.  If you are being discharged the day of surgery, you will not be allowed to drive home. You will need a responsible adult (18 years or older) to drive you home and stay with you that night.   If you are taking public transportation, you will need to have a responsible adult (18 years or older) with you. Please confirm with your physician that it is  acceptable to use public transportation.   Please call the [facility] Dept. at [phone] if you have any questions about these instructions.  Surgery Visitation Policy:  Patients undergoing a surgery or procedure may have two family members or support persons with them as long as the person is not COVID-19 positive or experiencing its symptoms.   Inpatient Visitation:    Visiting hours are 7 a.m. to 8 p.m. Up to four visitors are allowed at one time in a patient room. The visitors may rotate out with other people during the day. One designated support person (adult) may remain overnight.  MASKING: Due to an increase in RSV rates and hospitalizations, starting Wednesday, Nov. 15, in patient care areas in which we serve newborns, infants and children, masks will be required for teammates and visitors.  Children ages 20 and under may not visit. This policy affects the following departments only:  [place] [facility]partum area Mother Baby Unit Newborn nursery/Special care nursery  Other areas: Masks continue to be strongly recommended for [place] teammates, visitors and patients in all other areas. Visitation is not restricted outside of the units listed above.     Preparing for Surgery with CHLORHEXIDINE GLUCONATE (CHG) Soap  Chlorhexidine Gluconate (CHG) Soap  o An antiseptic cleaner that kills germs and bonds with the skin to continue killing germs even after washing  o Used for showering the night before surgery and morning of surgery  Before surgery, you can play an important role by reducing the number of germs on your skin.  CHG (Chlorhexidine gluconate) soap is an antiseptic cleanser which kills germs and bonds with the skin to continue killing germs even after washing.  Please do not use if you have an allergy to CHG or antibacterial soaps. If your skin becomes reddened/irritated stop using the CHG.  1. Shower the NIGHT BEFORE  SURGERY and the MORNING OF SURGERY with CHG soap.  2. If you choose to wash your hair, wash your hair first as usual with your normal shampoo.  3. After shampooing, rinse your hair and body thoroughly to remove the shampoo.  4. Use CHG as you would any other liquid soap. You can apply CHG directly to the skin and wash gently with a scrungie or a clean washcloth.  5. Apply the CHG soap to your body only from the neck down. Do not use on open wounds or open sores. Avoid contact with your eyes, ears, mouth, and genitals (private parts). Wash face and genitals (private parts) with your normal soap.  6. Wash thoroughly, paying special attention to the area where your surgery will be performed.  7. Thoroughly rinse your body with warm water.  8. Do not shower/wash with your normal soap after using and rinsing off the CHG soap.  9. Pat yourself dry with a clean towel.  10. Wear clean pajamas to bed the night before surgery.  12. Place clean sheets on your bed the night of your first shower and do not sleep with pets.  70. Shower again with the CHG soap on the day of surgery prior to arriving at the hospital.  14. Do not apply any deodorants/lotions/powders.  15. Please wear clean clothes to the hospital.

## 2022-11-07 ENCOUNTER — Other Ambulatory Visit: Payer: Self-pay

## 2022-11-07 ENCOUNTER — Ambulatory Visit: Payer: Medicare Other | Admitting: Certified Registered"

## 2022-11-07 ENCOUNTER — Observation Stay
Admission: RE | Admit: 2022-11-07 | Discharge: 2022-11-08 | Disposition: A | Discharge status: Home / Self Care | Payer: Medicare Other | Source: Ambulatory Visit | Attending: Otolaryngology | Admitting: Otolaryngology

## 2022-11-07 ENCOUNTER — Encounter: Payer: Self-pay | Admitting: Otolaryngology

## 2022-11-07 ENCOUNTER — Encounter
Admission: RE | Disposition: A | Discharge status: Home / Self Care | Payer: Self-pay | Source: Ambulatory Visit | Attending: Otolaryngology

## 2022-11-07 DIAGNOSIS — J449 Chronic obstructive pulmonary disease, unspecified: Secondary | ICD-10-CM | POA: Diagnosis not present

## 2022-11-07 DIAGNOSIS — E890 Postprocedural hypothyroidism: Secondary | ICD-10-CM

## 2022-11-07 DIAGNOSIS — E1140 Type 2 diabetes mellitus with diabetic neuropathy, unspecified: Secondary | ICD-10-CM | POA: Insufficient documentation

## 2022-11-07 DIAGNOSIS — Z79899 Other long term (current) drug therapy: Secondary | ICD-10-CM | POA: Diagnosis not present

## 2022-11-07 DIAGNOSIS — C73 Malignant neoplasm of thyroid gland: Principal | ICD-10-CM | POA: Insufficient documentation

## 2022-11-07 DIAGNOSIS — E118 Type 2 diabetes mellitus with unspecified complications: Secondary | ICD-10-CM

## 2022-11-07 DIAGNOSIS — J45909 Unspecified asthma, uncomplicated: Secondary | ICD-10-CM | POA: Insufficient documentation

## 2022-11-07 DIAGNOSIS — Z87891 Personal history of nicotine dependence: Secondary | ICD-10-CM | POA: Diagnosis not present

## 2022-11-07 DIAGNOSIS — Z01812 Encounter for preprocedural laboratory examination: Secondary | ICD-10-CM

## 2022-11-07 HISTORY — PX: THYROIDECTOMY: SHX17

## 2022-11-07 LAB — GLUCOSE, CAPILLARY
Glucose-Capillary: 231 mg/dL — ABNORMAL HIGH (ref 70–99)
Glucose-Capillary: 98 mg/dL (ref 70–99)

## 2022-11-07 SURGERY — THYROIDECTOMY
Anesthesia: General | Laterality: Bilateral

## 2022-11-07 MED ORDER — MOMETASONE FURO-FORMOTEROL FUM 200-5 MCG/ACT IN AERO
2.0000 | INHALATION_SPRAY | Freq: Two times a day (BID) | RESPIRATORY_TRACT | Status: DC
  Administered 2022-11-07 – 2022-11-08 (×2): 2 via RESPIRATORY_TRACT
  Filled 2022-11-07: qty 8.8

## 2022-11-07 MED ORDER — MORPHINE SULFATE (PF) 4 MG/ML IV SOLN: 3.0000 mg | INTRAVENOUS | Status: DC | PRN

## 2022-11-07 MED ORDER — INSULIN ASPART 100 UNIT/ML IJ SOLN
5.0000 [IU] | Freq: Once | INTRAMUSCULAR | Status: AC
  Administered 2022-11-07: 5 [IU] via SUBCUTANEOUS

## 2022-11-07 MED ORDER — FENTANYL CITRATE (PF) 100 MCG/2ML IJ SOLN
INTRAMUSCULAR | Status: AC
  Filled 2022-11-07: qty 2

## 2022-11-07 MED ORDER — INSULIN ASPART 100 UNIT/ML IJ SOLN
INTRAMUSCULAR | Status: AC
  Filled 2022-11-07: qty 1

## 2022-11-07 MED ORDER — SODIUM CHLORIDE 0.9 % IV SOLN: INTRAVENOUS | Status: DC

## 2022-11-07 MED ORDER — FENTANYL CITRATE (PF) 100 MCG/2ML IJ SOLN
25.0000 ug | INTRAMUSCULAR | Status: DC | PRN
  Administered 2022-11-07: 25 ug via INTRAVENOUS

## 2022-11-07 MED ORDER — METHOCARBAMOL 500 MG PO TABS
500.0000 mg | ORAL_TABLET | Freq: Four times a day (QID) | ORAL | Status: DC | PRN
  Administered 2022-11-07: 500 mg via ORAL
  Filled 2022-11-07: qty 1

## 2022-11-07 MED ORDER — ONDANSETRON HCL 4 MG/2ML IJ SOLN
INTRAMUSCULAR | Status: DC | PRN
  Administered 2022-11-07: 4 mg via INTRAVENOUS

## 2022-11-07 MED ORDER — PROPOFOL 10 MG/ML IV BOLUS
INTRAVENOUS | Status: AC
  Filled 2022-11-07: qty 20

## 2022-11-07 MED ORDER — PHENYLEPHRINE HCL-NACL 20-0.9 MG/250ML-% IV SOLN
INTRAVENOUS | Status: DC | PRN
  Administered 2022-11-07: 20 ug/min via INTRAVENOUS

## 2022-11-07 MED ORDER — SUCCINYLCHOLINE CHLORIDE 200 MG/10ML IV SOSY
PREFILLED_SYRINGE | INTRAVENOUS | Status: DC | PRN
  Administered 2022-11-07: 100 mg via INTRAVENOUS

## 2022-11-07 MED ORDER — OXYCODONE HCL 5 MG PO TABS: 5.0000 mg | ORAL_TABLET | Freq: Once | ORAL | Status: AC | PRN

## 2022-11-07 MED ORDER — LIDOCAINE-EPINEPHRINE (PF) 1 %-1:200000 IJ SOLN
INTRAMUSCULAR | Status: AC
  Filled 2022-11-07: qty 30

## 2022-11-07 MED ORDER — ACETAMINOPHEN 500 MG PO TABS: 1000.0000 mg | ORAL_TABLET | Freq: Four times a day (QID) | ORAL | Status: DC | PRN

## 2022-11-07 MED ORDER — LIDOCAINE-EPINEPHRINE (PF) 1 %-1:200000 IJ SOLN
INTRAMUSCULAR | Status: DC | PRN
  Administered 2022-11-07: 5 mL

## 2022-11-07 MED ORDER — DEXAMETHASONE SODIUM PHOSPHATE 10 MG/ML IJ SOLN
INTRAMUSCULAR | Status: DC | PRN
  Administered 2022-11-07: 10 mg via INTRAVENOUS

## 2022-11-07 MED ORDER — ONDANSETRON HCL 4 MG/2ML IJ SOLN: 4.0000 mg | Freq: Four times a day (QID) | INTRAMUSCULAR | Status: DC | PRN

## 2022-11-07 MED ORDER — ORAL CARE MOUTH RINSE: 15.0000 mL | Freq: Once | OROMUCOSAL | Status: AC

## 2022-11-07 MED ORDER — ACETAMINOPHEN 10 MG/ML IV SOLN: 1000.0000 mg | Freq: Once | INTRAVENOUS | Status: DC | PRN

## 2022-11-07 MED ORDER — LIDOCAINE HCL 4 % MT SOLN
OROMUCOSAL | Status: DC | PRN
  Administered 2022-11-07: 4 mL via TOPICAL

## 2022-11-07 MED ORDER — MAGNESIUM HYDROXIDE 400 MG/5ML PO SUSP: 30.0000 mL | Freq: Every day | ORAL | Status: DC | PRN

## 2022-11-07 MED ORDER — HYDROCODONE-ACETAMINOPHEN 5-325 MG PO TABS
1.0000 | ORAL_TABLET | ORAL | Status: DC | PRN
  Administered 2022-11-07 – 2022-11-08 (×2): 1 via ORAL
  Filled 2022-11-07 (×3): qty 1

## 2022-11-07 MED ORDER — METOPROLOL TARTRATE 50 MG PO TABS
100.0000 mg | ORAL_TABLET | Freq: Two times a day (BID) | ORAL | Status: DC
  Administered 2022-11-07 – 2022-11-08 (×2): 100 mg via ORAL
  Filled 2022-11-07 (×2): qty 2

## 2022-11-07 MED ORDER — BACITRACIN 500 UNIT/GM EX OINT
TOPICAL_OINTMENT | CUTANEOUS | Status: DC | PRN
  Administered 2022-11-07: 1 via TOPICAL

## 2022-11-07 MED ORDER — CHLORHEXIDINE GLUCONATE 0.12 % MT SOLN
15.0000 mL | Freq: Once | OROMUCOSAL | Status: AC
  Administered 2022-11-07: 15 mL via OROMUCOSAL

## 2022-11-07 MED ORDER — IBUPROFEN 400 MG PO TABS
800.0000 mg | ORAL_TABLET | Freq: Three times a day (TID) | ORAL | Status: DC | PRN
  Administered 2022-11-07 – 2022-11-08 (×3): 800 mg via ORAL
  Filled 2022-11-07 (×3): qty 2

## 2022-11-07 MED ORDER — MIDAZOLAM HCL 2 MG/2ML IJ SOLN
INTRAMUSCULAR | Status: AC
  Filled 2022-11-07: qty 2

## 2022-11-07 MED ORDER — PROPOFOL 10 MG/ML IV BOLUS
INTRAVENOUS | Status: DC | PRN
  Administered 2022-11-07: 150 mg via INTRAVENOUS
  Administered 2022-11-07: 20 mg via INTRAVENOUS

## 2022-11-07 MED ORDER — AMLODIPINE BESYLATE 10 MG PO TABS
10.0000 mg | ORAL_TABLET | Freq: Every day | ORAL | Status: DC
  Administered 2022-11-08: 10 mg via ORAL
  Filled 2022-11-07: qty 1

## 2022-11-07 MED ORDER — PROMETHAZINE HCL 25 MG/ML IJ SOLN: 6.2500 mg | INTRAMUSCULAR | Status: DC | PRN

## 2022-11-07 MED ORDER — LOSARTAN POTASSIUM 50 MG PO TABS
100.0000 mg | ORAL_TABLET | Freq: Every day | ORAL | Status: DC
  Administered 2022-11-08: 100 mg via ORAL
  Filled 2022-11-07: qty 2

## 2022-11-07 MED ORDER — DROPERIDOL 2.5 MG/ML IJ SOLN: 0.6250 mg | Freq: Once | INTRAMUSCULAR | Status: DC | PRN

## 2022-11-07 MED ORDER — CALCIUM CARBONATE ANTACID 500 MG PO CHEW
1.0000 | CHEWABLE_TABLET | Freq: Two times a day (BID) | ORAL | Status: DC
  Administered 2022-11-07 – 2022-11-08 (×3): 200 mg via ORAL
  Filled 2022-11-07 (×4): qty 1

## 2022-11-07 MED ORDER — BACITRACIN ZINC 500 UNIT/GM EX OINT
TOPICAL_OINTMENT | CUTANEOUS | Status: AC
  Filled 2022-11-07: qty 28.35

## 2022-11-07 MED ORDER — LIDOCAINE HCL (CARDIAC) PF 100 MG/5ML IV SOSY
PREFILLED_SYRINGE | INTRAVENOUS | Status: DC | PRN
  Administered 2022-11-07 (×2): 50 mg via INTRAVENOUS

## 2022-11-07 MED ORDER — CHLORHEXIDINE GLUCONATE 0.12 % MT SOLN
OROMUCOSAL | Status: AC
  Filled 2022-11-07: qty 15

## 2022-11-07 MED ORDER — BISACODYL 5 MG PO TBEC: 5.0000 mg | DELAYED_RELEASE_TABLET | Freq: Every day | ORAL | Status: DC | PRN

## 2022-11-07 MED ORDER — PHENYLEPHRINE HCL (PRESSORS) 10 MG/ML IV SOLN
INTRAVENOUS | Status: DC | PRN
  Administered 2022-11-07 (×2): 80 ug via INTRAVENOUS
  Administered 2022-11-07: 40 ug via INTRAVENOUS

## 2022-11-07 MED ORDER — ONDANSETRON 4 MG PO TBDP: 4.0000 mg | ORAL_TABLET | Freq: Four times a day (QID) | ORAL | Status: DC | PRN

## 2022-11-07 MED ORDER — OXYCODONE HCL 5 MG/5ML PO SOLN
ORAL | Status: AC
  Filled 2022-11-07: qty 5

## 2022-11-07 MED ORDER — ALBUTEROL SULFATE (2.5 MG/3ML) 0.083% IN NEBU
2.5000 mg | INHALATION_SOLUTION | Freq: Four times a day (QID) | RESPIRATORY_TRACT | Status: DC | PRN
  Administered 2022-11-07 – 2022-11-08 (×2): 2.5 mg via RESPIRATORY_TRACT
  Filled 2022-11-07 (×2): qty 3

## 2022-11-07 MED ORDER — MIDAZOLAM HCL 2 MG/2ML IJ SOLN
INTRAMUSCULAR | Status: DC | PRN
  Administered 2022-11-07 (×2): 1 mg via INTRAVENOUS

## 2022-11-07 MED ORDER — PHENYLEPHRINE HCL-NACL 20-0.9 MG/250ML-% IV SOLN
INTRAVENOUS | Status: AC
  Filled 2022-11-07: qty 250

## 2022-11-07 MED ORDER — FLEET ENEMA 7-19 GM/118ML RE ENEM: 1.0000 | ENEMA | Freq: Once | RECTAL | Status: DC | PRN

## 2022-11-07 MED ORDER — DEXTROSE-NACL 5-0.45 % IV SOLN: INTRAVENOUS | Status: DC

## 2022-11-07 MED ORDER — ACETAMINOPHEN 10 MG/ML IV SOLN
INTRAVENOUS | Status: DC | PRN
  Administered 2022-11-07: 1000 mg via INTRAVENOUS

## 2022-11-07 MED ORDER — FENTANYL CITRATE (PF) 100 MCG/2ML IJ SOLN
INTRAMUSCULAR | Status: DC | PRN
  Administered 2022-11-07: 50 ug via INTRAVENOUS
  Administered 2022-11-07: 25 ug via INTRAVENOUS
  Administered 2022-11-07: 50 ug via INTRAVENOUS
  Administered 2022-11-07: 25 ug via INTRAVENOUS

## 2022-11-07 MED ORDER — OXYCODONE HCL 5 MG/5ML PO SOLN
5.0000 mg | Freq: Once | ORAL | Status: AC | PRN
  Administered 2022-11-07: 5 mg via ORAL

## 2022-11-07 MED ORDER — METFORMIN HCL 500 MG PO TABS
1000.0000 mg | ORAL_TABLET | Freq: Two times a day (BID) | ORAL | Status: DC
  Administered 2022-11-07 – 2022-11-08 (×2): 1000 mg via ORAL
  Filled 2022-11-07 (×2): qty 2

## 2022-11-07 MED ORDER — 0.9 % SODIUM CHLORIDE (POUR BTL) OPTIME
TOPICAL | Status: DC | PRN
  Administered 2022-11-07: 500 mL

## 2022-11-07 SURGICAL SUPPLY — 44 items
BLADE SURG 15 STRL LF DISP TIS (BLADE) ×2 IMPLANT
BLADE SURG 15 STRL SS (BLADE) ×2
BULB RESERV EVAC DRAIN JP 100C (MISCELLANEOUS) IMPLANT
CORD BIP STRL DISP 12FT (MISCELLANEOUS) ×1 IMPLANT
DRAIN JP 10F RND SILICONE (MISCELLANEOUS) IMPLANT
DRAPE MAG INST 16X20 L/F (DRAPES) ×1 IMPLANT
DRSG TEGADERM 2-3/8X2-3/4 SM (GAUZE/BANDAGES/DRESSINGS) ×1 IMPLANT
DRSG TEGADERM 4X4.75 (GAUZE/BANDAGES/DRESSINGS) ×1 IMPLANT
DRSG TELFA 3X8 NADH STRL (GAUZE/BANDAGES/DRESSINGS) ×1 IMPLANT
ELECT CAUTERY BLADE 6.4 (BLADE) ×1 IMPLANT
ELECT LARYNGEAL DUAL CHAN (ELECTRODE) ×1 IMPLANT
ELECT NEEDLE 20X.3 GREEN (MISCELLANEOUS)
ELECT REM PT RETURN 9FT ADLT (ELECTROSURGICAL) ×1
ELECTRODE NDL 20X.3 GREEN (MISCELLANEOUS) ×1 IMPLANT
ELECTRODE NEEDLE 20X.3 GREEN (MISCELLANEOUS) IMPLANT
ELECTRODE REM PT RTRN 9FT ADLT (ELECTROSURGICAL) ×1 IMPLANT
FORCEPS JEWEL BIP 4-3/4 STR (INSTRUMENTS) ×1 IMPLANT
GAUZE 4X4 16PLY ~~LOC~~+RFID DBL (SPONGE) ×2 IMPLANT
GLOVE BIO SURGEON STRL SZ7.5 (GLOVE) ×1 IMPLANT
GLOVE PROTEXIS LATEX SZ 7.5 (GLOVE) ×1 IMPLANT
GLOVE SURG LATEX 7.5 PF (GLOVE) ×1 IMPLANT
GOWN STRL REUS W/ TWL LRG LVL3 (GOWN DISPOSABLE) ×3 IMPLANT
GOWN STRL REUS W/TWL LRG LVL3 (GOWN DISPOSABLE) ×4
HEMOSTAT SURGICEL 2X3 (HEMOSTASIS) ×1 IMPLANT
HOOK STAY BLUNT/RETRACTOR 5M (MISCELLANEOUS) ×1 IMPLANT
LABEL OR SOLS (LABEL) ×1 IMPLANT
MANIFOLD NEPTUNE II (INSTRUMENTS) ×1 IMPLANT
NS IRRIG 500ML POUR BTL (IV SOLUTION) ×1 IMPLANT
PACK HEAD/NECK (MISCELLANEOUS) ×1 IMPLANT
PROBE NEUROSIGN BIPOL (MISCELLANEOUS) ×1 IMPLANT
PROBE NEUROSIGN BIPOLAR (MISCELLANEOUS) ×1
SHEARS HARMONIC 9CM CVD (BLADE) ×1 IMPLANT
SOL PREP PVP 2OZ (MISCELLANEOUS) ×1
SOLUTION PREP PVP 2OZ (MISCELLANEOUS) ×1 IMPLANT
SPONGE KITTNER 5P (MISCELLANEOUS) ×1 IMPLANT
STRAP SAFETY 5IN WIDE (MISCELLANEOUS) ×1 IMPLANT
SUT ETHILON 6 0 9-3 1X18 BLK (SUTURE) IMPLANT
SUT PROLENE 6 0 P 1 18 (SUTURE) ×1 IMPLANT
SUT SILK 2 0 (SUTURE) ×1
SUT SILK 2-0 18XBRD TIE 12 (SUTURE) ×1 IMPLANT
SUT VIC AB 4-0 RB1 18 (SUTURE) ×1 IMPLANT
SYSTEM CHEST DRAIN TLS 7FR (DRAIN) IMPLANT
TRAP FLUID SMOKE EVACUATOR (MISCELLANEOUS) ×1 IMPLANT
WATER STERILE IRR 500ML POUR (IV SOLUTION) ×1 IMPLANT

## 2022-11-07 NOTE — H&P (Signed)
H&P has been reviewed and patient reevaluated, no changes necessary. To be downloaded later.  

## 2022-11-07 NOTE — Anesthesia Preprocedure Evaluation (Signed)
Anesthesia Evaluation  Patient identified by MRN, date of birth, ID band Patient awake    Reviewed: Allergy & Precautions, NPO status , Patient's Chart, lab work & pertinent test results  Airway Mallampati: II  TM Distance: >3 FB Neck ROM: full    Dental  (+) Teeth Intact   Pulmonary asthma , COPD, former smoker   Pulmonary exam normal        Cardiovascular hypertension, Normal cardiovascular exam+ dysrhythmias + Valvular Problems/Murmurs   12/2021 Echo: EF 60-65%, no rwma, GrI DD, nl RV fxn. RVSP 28.85mHg. Mild MR.  12/2021 Zio: Predominantly sinus rhythm at 80 (38-136). Frequent PACs (5.3%). 5 short runs of atrial tachycardia (longest 33 beats at 119 bpm. Fastest 160 bpm). Triggered events associated PACs and short runs of PAT.   Neuro/Psych  PSYCHIATRIC DISORDERS      negative neurological ROS     GI/Hepatic Neg liver ROS,GERD  ,,  Endo/Other  diabetes, Poorly Controlled, Type 2, Insulin Dependent    Renal/GU      Musculoskeletal   Abdominal Normal abdominal exam  (+)   Peds  Hematology negative hematology ROS (+)   Anesthesia Other Findings Past Medical History: No date: Acute diverticulitis 10/14/2016: Acute pain of right knee 01/26/2019: Acute renal failure (ARF) (HCC) No date: Anemia No date: Arthritis No date: Chronic back pain     Comment:  Complicated by neuropathy. ->  On Neurontin and               duloxetine along with Flexeril and Voltaren gel.  Also               uses PRN tramadol. No date: COPD with asthma [place]     Comment:  On combination of albuterol Qvar and Symbicort No date: Depression No date: [place] complication [place] 001/01/7252 Diverticulitis large intestine No date: Dyspnea No date: GERD (gastroesophageal reflux disease) No date: Heart murmur No date: Hypertension No date: Urinary incontinence     Comment:  Pessary present  Past Surgical History: 12/2021:  7-Day Zio Patch Monitor     Comment:  Predominant sinus rhythm.  Rate range 38-136 bpm with an              average of 80 bpm (bradycardia only noted during hours of              sleep.  5 Atrial Runs-fastest was 5 beats at a max rate               162 bpm, longest was 17.2 seconds/33 beats at a rate of               119 bpm--not noted on patient trigger.  Frequent PACs               noted (5.3%).  Rare PVCs. No date: ABDOMINAL HYSTERECTOMY 10/02/2021: COLONOSCOPY WITH PROPOFOL; N/A     Comment:  Procedure: COLONOSCOPY WITH PROPOFOL;  Surgeon: VLin Landsman MD;  Location: [facility] ENDOSCOPY;  Service:               Gastroenterology;  Laterality: N/A; 01/03/2022: Coronary CT angiogram     Comment:  Thoracic aortic atherosclerosis.  Coronary Calcium Score              1.29.  Minimal proximal LAD calcification. (<25%). 10/02/2021: ESOPHAGOGASTRODUODENOSCOPY; N/A     Comment:  Procedure: ESOPHAGOGASTRODUODENOSCOPY (EGD);  Surgeon:  Lin Landsman, MD;  Location: [facility] ENDOSCOPY;                Service: Gastroenterology;  Laterality: N/A; 09/29/2015: TRANSTHORACIC ECHOCARDIOGRAM     Comment:  EF 55-60%.  No R WMA.  GR 1 DD.  Is also normal valves.               Normal study. 01/09/2022: TRANSTHORACIC ECHOCARDIOGRAM     Comment:  EF 60 to 65%.  Mild to moderate LVH.  G1 DD.  Normal RV               size and function.  Normal atrial sizes.  Normal valves.               NORMAL ECHO No date: TUBAL LIGATION  BMI    Body Mass Index: 28.76 kg/m      Reproductive/Obstetrics negative OB ROS                             Anesthesia Physical Anesthesia Plan  ASA: 3  Anesthesia Plan: General/Spinal   Post-op Pain Management: Toradol IV (intra-op)* and Ofirmev IV (intra-op)*   Induction: Intravenous  PONV Risk Score and Plan: 3 and Ondansetron, Dexamethasone and Midazolam  Airway Management Planned: Oral ETT  Additional  Equipment:   Intra-op Plan:   Post-operative Plan: Extubation in OR  Informed Consent: I have reviewed the patients History and Physical, chart, labs and discussed the procedure including the risks, benefits and alternatives for the proposed anesthesia with the patient or authorized representative who has indicated his/her understanding and acceptance.     Dental Advisory Given  Plan Discussed with: Anesthesiologist, CRNA and Surgeon  Anesthesia Plan Comments: (Patient consented for risks of anesthesia including but not limited to:  - adverse reactions to medications - damage to eyes, teeth, lips or other oral mucosa - nerve damage due to positioning  - sore throat or hoarseness - Damage to heart, brain, nerves, lungs, other parts of body or loss of life  Patient voiced understanding.)        Anesthesia Quick Evaluation

## 2022-11-07 NOTE — Anesthesia Procedure Notes (Signed)
Procedure Name: Intubation Date/Time: 11/07/2022 7:38 AM  Performed by: Biagio Borg, CRNAPre-anesthesia Checklist: Patient identified, Emergency Drugs available, Suction available and Patient being monitored Patient Re-evaluated:Patient Re-evaluated prior to induction Oxygen Delivery Method: Circle system utilized Preoxygenation: Pre-oxygenation with 100% oxygen Induction Type: IV induction and Rapid sequence Laryngoscope Size: McGraph and 3 Grade View: Grade I Tube type: Oral Tube size: 7.0 mm Number of attempts: 1 Airway Equipment and Method: Stylet Placement Confirmation: ETT inserted through vocal cords under direct vision, positive ETCO2 and breath sounds checked- equal and bilateral Secured at: 20 cm Tube secured with: Tape Dental Injury: Teeth and Oropharynx as per pre-operative assessment

## 2022-11-07 NOTE — Anesthesia Postprocedure Evaluation (Signed)
Anesthesia Post Note  Patient: [name]  Procedure(s) Performed: TOTAL THYROIDECTOMY (Bilateral)  Patient location during evaluation: PACU Anesthesia Type: Combined General/Spinal Level of consciousness: awake and alert Pain management: pain level controlled Vital Signs Assessment: post-procedure vital signs reviewed and stable Respiratory status: spontaneous breathing, nonlabored ventilation and respiratory function stable Cardiovascular status: blood pressure returned to baseline and stable Postop Assessment: no apparent nausea or vomiting Anesthetic complications: no   No notable events documented.   Last Vitals:  Vitals:   11/07/22 1113 11/07/22 1142  BP: (!) 152/85 (!) 151/96  Pulse: (!) 54 (!) 54  Resp: 16 16  Temp: 36.5 C 36.8 C  SpO2: 99% 91%    Last Pain:  Vitals:   11/07/22 1142  TempSrc: Oral  PainSc:                  [place] Ouch

## 2022-11-07 NOTE — Transfer of Care (Signed)
Immediate Anesthesia Transfer of Care Note  Patient: [name]  Procedure(s) Performed: TOTAL THYROIDECTOMY (Bilateral)  Patient Location: PACU  Anesthesia Type:General  Level of Consciousness: awake and patient cooperative  Airway & Oxygen Therapy: Patient Spontanous Breathing and Patient connected to face mask oxygen  Post-op Assessment: Report given to RN and Post -op Vital signs reviewed and stable  Post vital signs: Reviewed and stable  Last Vitals:  Vitals Value Taken Time  BP 168/98 11/07/22 0940  Temp    Pulse 51 11/07/22 0943  Resp 22 11/07/22 0943  SpO2 100 % 11/07/22 0943  Vitals shown include unvalidated device data.  Last Pain:  Vitals:   11/07/22 0625  TempSrc: Temporal  PainSc: 3          Complications: No notable events documented.

## 2022-11-07 NOTE — Op Note (Signed)
11/07/2022  9:50 AM    [name]  [id number]   Pre-Op Dx: Right thyroid nodule with high suspicion for cancer  Post-op Dx: Same  Proc: Total thyroidectomy  Surg:  Huey Romans    certified surgical tech: Adrienne  Anes:  GOT  EBL: 20 mL  Comp: None  Findings: Hard firm nodule in the right lower lobe.  Very small left thyroid lobe.  Both nerves found and intact with good stimulation at the end of the case.  3 out of 4 parathyroids found and left intact  Procedure: Patient was brought into the operating room and placed in supine position.  She was given general anesthesia by oral endotracheal intubation.  She used a endotracheal tube that was wrapped with electrodes to monitor the vocal cord mobility during the procedure.  Once patient was fully asleep shoulder roll was placed with the head slightly extended.  The neck was then marked for a low anterior slightly curved incision to match the skin crease line.  5 mL of 1% Xylocaine with epi 1: 100,000 was used for infiltration around the wound edges.  She was then prepped and draped in sterile fashion.  The nerve monitoring was done continuously of the recurrent laryngeal nerves through the case.  An incision was created through the previously marked area and carried down through subcu to the platysma layer.  This was divided in the midline.  Large anterior jugular veins were noted on both sides but were not cut.  These were left to the sides.  The strap muscles were divided in the midline and the isthmus of the thyroid was found.  The trachea was easily seen beneath this.  The strap muscles were elevated over the right thyroid gland and you could feel a hard mass at the inferior pole of the right thyroid.  The more superior thyroid was soft and fairly long extended up along the side of the larynx.  The attachments laterally were freed up from the thyroid gland and the strap muscles were freed up superiorly as well from the top of the  gland.  The superior thyroid vessels were cut across with the harmonic scalpel and the parathyroid gland here was separated from the thyroid and left in the bed.  More posteriorly on the lateral wall there was a parathyroid attached to the lateral thyroid.  This was freed up and left in the bed as well.  Remaining thyroid was then rotated medially to dissect beneath it.  The attachments were freed up and the recurrent laryngeal nerve was found in his bed traveling up to the larynx.  This was left alone and the thyroid was separated from the trachea and the Berry's ligament.  Once this was freed up the remaining attachments on the right side of the trachea were freed and this completely removed the right thyroid gland.  There is no significant bleeding in the bed.  The harmonic scalpel was used for cutting small vessels that we came across.  The nerve was intact and stimulated well.  The strap muscles were then elevated over the left thyroid gland.  This gland was much smaller and quite thin.  There was some fatty tissue at the inferior pole and could have been a parathyroid gland here but I did not see one specifically.  As I freed up the superior pole there was a parathyroid gland that I peeled off here as they came across the superior thyroid vessels.  The vessels were cut across  with the harmonic scalpel.  The gland was rotated medially and the recurrent laryngeal nerve was found again.  This was traced up to going into the larynx and the thyroid attachments superior/medial to it at [facility] ligament were then cut across to free up the gland from the trachea.  The gland was then marked with a short stitch at the right superior pole and a longer stitch at the left superior pole.  There is a small isthmus that was evident, and the firm mass was in the right inferior pole.  Wound was irrigated and there were minimal areas of slight ooze.  These were cauterized with bipolar cautery.  The recurrent laryngeal nerves  were stimulated again to make sure these were working well.  Surgicel was then placed into the beds on both sides.  A 7 TLS drain was placed near the midline and then extending down into the right thyroid bed.  The strap muscles were then loosely closed.  The platysma layer was closed on both sides with 4-0 Vicryl's, and then in the midline the subdermal sutures were placed with 4-0 Vicryl's.  The skin edges were held in apposition with a 6-0 nylon suture.  This was a running locking stitch.  The wound was covered with bacitracin, Telfa, and Tegaderm.  The drain was placed to low continuous Vacutainer suction and taped to the skin.  The patient tolerated the procedure well.  She was awakened and taken to the recovery room in satisfactory condition.  There were no operative complications.  Dispo:   To PACU to be then sent to the MedSurg floor to be observed overnight.  Plan: We will see her in the morning and plan on removing her drain and changing her dressing.  We will check her lab work early to make sure calcium is okay.  [name]  11/07/2022 9:50 AM

## 2022-11-08 DIAGNOSIS — C73 Malignant neoplasm of thyroid gland: Secondary | ICD-10-CM | POA: Diagnosis not present

## 2022-11-08 LAB — CALCIUM: Calcium: 9.2 mg/dL (ref 8.9–10.3)

## 2022-11-08 MED ORDER — HYDROCODONE-ACETAMINOPHEN 5-325 MG PO TABS: 1.0000 | ORAL_TABLET | Freq: Four times a day (QID) | ORAL | 0 refills | Status: AC | PRN

## 2022-11-08 NOTE — Discharge Summary (Signed)
[name], [name] [id number] 1966/07/26 Margaretha Sheffield, MD  Discharge summary  HPI: The patient was admitted yesterday for removal of her thyroid gland because of high suspicion of cancer found on an FNA of her right thyroid nodule.  She had total thyroidectomy with sparing of her recurrent laryngeal nerves.  These stimulated well at the end of the case.  3 out of 4 parathyroid glands were found.  She tolerated the procedure very well. She is able to swallow okay with soft foods and liquids currently.  She has some pain in her throat that is controlled with medication.  Her dressing is changed today and her drain is removed.  She has no swelling on her wound or signs of any fluid collection or infection.  Allergies: No Known Allergies  ROS: Review of systems normal other than 12 systems except per HPI.  PMH:  Past Medical History:  Diagnosis Date   Acute diverticulitis    Acute metabolic encephalopathy    Acute pain of right knee 10/14/2016   Acute renal failure (ARF) ([place]) 01/26/2019   Anemia    Arthritis    Chest pain    a. 12/2021 Cor CTA: LM nl, LAD <25%, LCX nl, RCA nl. Cor Ca2+ = 1.29.   Chronic back pain    Complicated by neuropathy. ->  On Neurontin and duloxetine along with Flexeril and Voltaren gel.  Also uses PRN tramadol.   Colon polyps    COPD with asthma    On combination of albuterol Qvar and Symbicort   Depression    Diabetes mellitus without complication ([place])    type 2   Diabetic neuropathy (HCC)    Diastolic dysfunction    a. 09/2015 Echo: EF 55-60%, GrI DD; b. 12/2021 Echo: EF 60-65%, no rwma, GrI DD, nl RV fxn. RVSP 28.17mHg. Mild MR.   Diverticulitis large intestine 05/12/2020   Dyspnea    GERD (gastroesophageal reflux disease)    Heart murmur    Hyperlipidemia    Hypertension    Lipoma of left upper extremity    Palpitations    a. 12/2021 Zio: Predominantly sinus rhythm at 80 (38-136).  Frequent PACs (5.3%).  5 short runs of atrial tachycardia (longest 33  beats at 119 bpm.  Fastest 160 bpm).  Triggered events associated PACs and short runs of PAT.   [place]    Urinary incontinence    Pessary present    FH:  Family History  Problem Relation Age of Onset   Hypertension Mother    Other Other     SH:  Social History   Socioeconomic History   Marital status: Single    Spouse name: Not on file   Number of children: 3   Years of education: Not on file   Highest education level: Not on file  Occupational History   Not on file  Tobacco Use   Smoking status: Former    Packs/day: 3.00    Years: 10.00    Total pack years: 30.00    Types: Cigarettes    Quit date: 12/22/2004    Years since quitting: 17.8   Smokeless tobacco: Never  Vaping Use   Vaping Use: Never used  Substance and Sexual Activity   Alcohol use: No   Drug use: No   Sexual activity: Not Currently  Other Topics Concern   Not on file  Social History Narrative   [place]- Obgyn    AHollice Espy-  urology    [place] - neurology    Lives alone   Social Determinants of Health   Financial Resource Strain: Not on file  Food Insecurity: No Food Insecurity (11/07/2022)   Hunger Vital Sign    Worried About Running Out of Food in the Last Year: Never true    Ran Out of Food in the Last Year: Never true  Transportation Needs: No Transportation Needs (11/07/2022)   PRAPARE - Hydrologist (Medical): No    Lack of Transportation (Non-Medical): No  Physical Activity: Not on file  Stress: Not on file  Social Connections: Not on file  Intimate Partner Violence: Not At Risk (11/07/2022)   Humiliation, Afraid, Rape, and Kick questionnaire    Fear of Current or Ex-Partner: No    Emotionally Abused: No    Physically Abused: No    Sexually Abused: No    PSH:  Past Surgical History:  Procedure Laterality Date   7-Day Zio Patch Monitor  12/2021   Predominant sinus rhythm.  Rate range 38-136  bpm with an average of 80 bpm (bradycardia only noted during hours of sleep.  5 Atrial Runs-fastest was 5 beats at a max rate 162 bpm, longest was 17.2 seconds/33 beats at a rate of 119 bpm--not noted on patient trigger.  Frequent PACs noted (5.3%).  Rare PVCs.   ABDOMINAL HYSTERECTOMY     COLONOSCOPY WITH PROPOFOL N/A 10/02/2021   Procedure: COLONOSCOPY WITH PROPOFOL;  Surgeon: Lin Landsman, MD;  Location: [facility] ENDOSCOPY;  Service: Gastroenterology;  Laterality: N/A;   Coronary CT angiogram  01/03/2022   Thoracic aortic atherosclerosis.  Coronary Calcium Score 1.29.  Minimal proximal LAD calcification. (<25%).   ESOPHAGOGASTRODUODENOSCOPY N/A 10/02/2021   Procedure: ESOPHAGOGASTRODUODENOSCOPY (EGD);  Surgeon: Lin Landsman, MD;  Location: [facility] ENDOSCOPY;  Service: Gastroenterology;  Laterality: N/A;   LIPOMA EXCISION Left 07/08/2022   upper arm   THYROIDECTOMY Bilateral 11/07/2022   Procedure: TOTAL THYROIDECTOMY;  Surgeon: Margaretha Sheffield, MD;  Location: [facility] ORS;  Service: ENT;  Laterality: Bilateral;   TRANSTHORACIC ECHOCARDIOGRAM  09/29/2015   EF 55-60%.  No R WMA.  GR 1 DD.  Is also normal valves.  Normal study.   TRANSTHORACIC ECHOCARDIOGRAM  01/09/2022   EF 60 to 65%.  Mild to moderate LVH.  G1 DD.  Normal RV size and function.  Normal atrial sizes.  Normal valves.  NORMAL ECHO   TUBAL LIGATION      Physical  Exam: Her neck wound looks good with wound flat.  There is no swelling or inflammation.  The drain is out.  A fresh dressing is applied.  Her voice is clear and she is breathing easily although she did require a breathing treatment last night because of a coughing spell.. 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  We will see her back on Wednesday next week to go over the path report and decide upon further treatment.  We will take out her stitches then.  I have not started her on  thyroid medication yet until we get the path report and see if she needs to get radioactive iodine. She will rest at home with her head elevated a little bit.  She can slowly increase her activities.  She can eat soft foods and increase her diet as tolerated.  I have sent in some Tylenol with hydrocodone to use for pain for  next couple days and then she can just use Tylenol alone.  She will call if she has any questions or concerns   Huey Romans 11/08/2022 8:19 AM

## 2022-11-08 NOTE — TOC Transition Note (Signed)
Transition of Care [facility]) - CM/SW Discharge Note   Patient Details  Name: [name] MRN: [id number] Date of Birth: 1966/10/07  [facility] CM/SW Contact:  Izola Price, RN Phone Number: 11/08/2022, 10:23 AM   Clinical Narrative: 11/18: OBS status at 24 hours but has discharge orders in this am at 0818. S/P thyroid surgery. No TOC consult or need identified on discharge plan. [name] RN CM             Patient Goals and CMS Choice        Discharge Placement                       Discharge Plan and Services                                     Social Determinants of Health (SDOH) Interventions     Readmission Risk Interventions     No data to display

## 2022-11-11 LAB — SURGICAL PATHOLOGY

## 2022-11-20 ENCOUNTER — Encounter: Payer: Medicare Other | Admitting: Physical Therapy

## 2022-11-20 ENCOUNTER — Encounter: Payer: Self-pay | Admitting: Physical Therapy

## 2022-11-20 DIAGNOSIS — R1030 Lower abdominal pain, unspecified: Secondary | ICD-10-CM

## 2022-11-20 DIAGNOSIS — R252 Cramp and spasm: Secondary | ICD-10-CM | POA: Diagnosis not present

## 2022-11-20 NOTE — Therapy (Signed)
OUTPATIENT OCCUPATIONAL THERAPY TREATMENT NOTE   Patient Name: [name] MRN: [id number] DOB:1966/08/15, 56 y.o., female Today's Date: 11/20/2022  PCP:  [name], MD  REFERRING PROVIDER: Jaquita Folds, MD   END OF SESSION:    Past Medical History:  Diagnosis Date   Acute diverticulitis    Acute metabolic encephalopathy    Acute pain of right knee 10/14/2016   Acute renal failure (ARF) ([place]) 01/26/2019   Anemia    Arthritis    Chest pain    a. 12/2021 Cor CTA: LM nl, LAD <25%, LCX nl, RCA nl. Cor Ca2+ = 1.29.   Chronic back pain    Complicated by neuropathy. ->  On Neurontin and duloxetine along with Flexeril and Voltaren gel.  Also uses PRN tramadol.   Colon polyps    COPD with asthma    On combination of albuterol Qvar and Symbicort   Depression    Diabetes mellitus without complication ([place])    type 2   Diabetic neuropathy (HCC)    Diastolic dysfunction    a. 09/2015 Echo: EF 55-60%, GrI DD; b. 12/2021 Echo: EF 60-65%, no rwma, GrI DD, nl RV fxn. RVSP 28.31mHg. Mild MR.   Diverticulitis large intestine 05/12/2020   Dyspnea    GERD (gastroesophageal reflux disease)    Heart murmur    Hyperlipidemia    Hypertension    Lipoma of left upper extremity    Palpitations    a. 12/2021 Zio: Predominantly sinus rhythm at 80 (38-136).  Frequent PACs (5.3%).  5 short runs of atrial tachycardia (longest 33 beats at 119 bpm.  Fastest 160 bpm).  Triggered events associated PACs and short runs of PAT.   Thyroid cancer ([facility]    Urinary incontinence    Pessary present   Past Surgical History:  Procedure Laterality Date   7-Day Zio Patch Monitor  12/2021   Predominant sinus rhythm.  Rate range 38-136 bpm with an average of 80 bpm (bradycardia only noted during hours of sleep.  5 Atrial Runs-fastest was 5 beats at a max rate 162 bpm, longest was 17.2 seconds/33 beats at a rate of 119 bpm--not noted on patient trigger.  Frequent PACs noted (5.3%).  Rare PVCs.    ABDOMINAL HYSTERECTOMY     COLONOSCOPY WITH PROPOFOL N/A 10/02/2021   Procedure: COLONOSCOPY WITH PROPOFOL;  Surgeon: VLin Landsman MD;  [facility]  Service: Gastroenterology;  Laterality: N/A;   Coronary CT angiogram  01/03/2022   Thoracic aortic atherosclerosis.  Coronary Calcium Score 1.29.  Minimal proximal LAD calcification. (<25%).   ESOPHAGOGASTRODUODENOSCOPY N/A 10/02/2021   Procedure: ESOPHAGOGASTRODUODENOSCOPY (EGD);  Surgeon: VLin Landsman MD;  [facility];  Service: Gastroenterology;  Laterality: N/A;   LIPOMA EXCISION Left 07/08/2022   upper arm   THYROIDECTOMY Bilateral 11/07/2022   Procedure: TOTAL THYROIDECTOMY;  Surgeon: JMargaretha Sheffield MD;  Location: [facility] ORS;  Service: ENT;  Laterality: Bilateral;   TRANSTHORACIC ECHOCARDIOGRAM  09/29/2015   EF 55-60%.  No R WMA.  GR 1 DD.  Is also normal valves.  Normal study.   TRANSTHORACIC ECHOCARDIOGRAM  01/09/2022   EF 60 to 65%.  Mild to moderate LVH.  G1 DD.  Normal RV size and function.  Normal atrial sizes.  Normal valves.  NORMAL ECHO   TUBAL LIGATION     Patient Active Problem List   Diagnosis Date Noted   S/P total thyroidectomy 11/07/2022   Vertigo 09/26/2022   Left-sided weakness 09/26/2022   DM (diabetes mellitus),  type 2 with complications ([place]) 09/98/3382   Thyroid cancer ([place]) 50/53/9767   Acute metabolic encephalopathy 34/19/3790   Lumbar strain 08/13/2022   Lipoma of left upper extremity    Exercise intolerance 12/26/2021   Heart murmur 12/26/2021   DOE (dyspnea on exertion) 12/26/2021   Palpitations 12/26/2021   Abdominal bloating    Polyp of colon    Multiple adenomatous polyps    Depression    Hyperlipidemia associated with type 2 diabetes mellitus ([place])    GERD (gastroesophageal reflux disease)    Influenza A 01/26/2019   Hypokalemia 06/12/2017   Marijuana use 06/12/2017   Diabetic neuropathy ([place]) 01/07/2016   Plantar fasciitis of left foot 11/26/2015    Back pain 10/10/2015   Asthma 09/29/2015   Essential hypertension 09/29/2015   DM (diabetes mellitus) type II controlled, neurological manifestation ([place]) 09/29/2015   REFERRING DIAG:  N32.81 (ICD-10-CM) - Overactive bladder  N39.3 (ICD-10-CM) - SUI (stress urinary incontinence, female)  M62.838 (ICD-10-CM) - Levator spasm      THERAPY DIAG:  Cramp and spasm   Lower abdominal pain   Rationale for Evaluation and Treatment Rehabilitation   ONSET DATE: 2022   SUBJECTIVE:                                                                                                                                                                                            SUBJECTIVE STATEMENT: Patient reports her pain is increased. She has to stand to urinate. Patient feels like the vagina is coming out. She has to push the pessary out. I had my thyroid out last week and it was cancerous. Patient reports her vagina has been itching and burning.     PAIN:  Are you having pain? Yes NPRS scale: 6/10 Pain location:  suprapubic    Pain type: pins Pain description: intermittent    Aggravating factors: urinate, standing or sitting too long, walk a long distance Relieving factors: walk, medication   PRECAUTIONS: None   WEIGHT BEARING RESTRICTIONS No   FALLS:  Has patient fallen in last 6 months? Yes. Number of falls 5 due to her knee giving out and not due to balance   LIVING ENVIRONMENT: Lives with: lives alone   OCCUPATION: none   PLOF: Independent   PATIENT GOALS reduce pain and prior function   PERTINENT HISTORY:  Abdominal hysterectomy; acute renal failure, diabetes, COPD   BOWEL MOVEMENT Pain with bowel movement: No and no issues with BM   URINATION Pain with urination: Yes, sits for 15 minutes for the urine stream to come. She then will urinate and have pain.  Fully empty  bladder: Yes: if she sits for 20 minutes Stream:  medium Urgency: Yes:   Frequency: every 2 hours during  the day, gets up 3-4 times at night Leakage: Urge to void, Walking to the bathroom, Coughing, Sneezing, Laughing, Lifting, and Bending forward Pads: Yes: 2 pads  during the day and 1 long pad at night   INTERCOURSE not sexually active   PROLAPSE Stage II anterior, Stage II posterior, Stage I apical prolapse      OBJECTIVE:    (Copy Today's treatment to Plan section here)     DIAGNOSTIC FINDINGS:  PVR of 56 ml was obtained by bladder scan. Pelvic floor strength III/V, Pelvic floor musculature: Right levator non-tender, Right obturator tender, Left levator non-tender, Left obturator tender     COGNITION:            Overall cognitive status: Within functional limits for tasks assessed                          SENSATION:            Light touch: Appears intact            Proprioception: Appears intact   Patie              POSTURE: rounded shoulders, forward head, and flexed trunk                PELVIC ALIGNMENT:   LUMBARAROM/PROM   A/PROM A/PROM  eval  Flexion full  Extension Decreased by 25%  Right lateral flexion Decreased by 25%  Left lateral flexion full  Right rotation Decreased by 25%  Left rotation full   (Blank rows = not tested)   LOWER EXTREMITY ROM:   Passive ROM Right eval Left eval  Hip external rotation 65 45   (Blank rows = not tested)   LOWER EXTREMITY MMT:   MMT Right eval Left eval  Hip flexion   4-/5  Hip extension 4/5 4/5  Hip abduction 4/5 4/5  Hip adduction 4/5 4/5     PALPATION:   General  tenderness located on the scar, suprapubic bone and lower abdominal                 External Perineal Exam no tenderness                             Internal Pelvic Floor tenderness located on the right obturator internist and levator ani  11/20/2022: no palpable tenderness located in the urethra and along the introitus.  Patient confirms identification and approves PT to assess internal pelvic floor and treatment Yes   PELVIC MMT:   MMT  eval 11/20/2022  Vaginal 1/5 2/5  (Blank rows = not tested)         TONE: Increased on the right   PROLAPSE: Patient had pessary in   TODAY'S TREATMENT  11/20/2022 Manual: Soft tissue mobilization: Sit on tennis ball and massage the pelvic floor muscles.  Internal pelvic floor techniques:No emotional/communication barriers or cognitive limitation. Patient is motivated to learn. Patient understands and agrees with treatment goals and plan. PT explains patient will be examined in standing, sitting, and lying down to see how their muscles and joints work. When they are ready, they will be asked to remove their underwear so PT can examine their perineum. The patient is also given the option of providing their own chaperone as one is  not provided in our facility. The patient also has the right and is explained the right to defer or refuse any part of the evaluation or treatment including the internal exam. With the patient's consent, PT will use one gloved finger to gently assess the muscles of the pelvic floor, seeing how well it contracts and relaxes and if there is muscle symmetry. After, the patient will get dressed and PT and patient will discuss exam findings and plan of care. PT and patient discuss plan of care, schedule, attendance policy and HEP activities.  Place index finger into the vaginal canal to assess strength and see where her pessary is. The pessary is in the canal and she did not have tenderness with light touch. White discharge was covering the labia minora and the vulvar area. The area around the urethra and the vaginal canal was red and irritated. Patient reports she has been scratching due to the itching and burning feeling she is having.  Neuromuscular re-education: Down training: Diaphragmatic breathing to relax the pelvic floor Exercises: Stretches/mobility: Piriformis stretch in sitting holding for 30 sec bil.  Hamstring stretch in sitting holding for 30 sec bil.   Sitting hip adductor stretch holding 30 sec each Sitting happy baby stretch holding for 30 sec Sitting cat camel 15x  10/23/2022 Manual: Soft tissue mobilization: to the lower abdomen to work through the tenderness, and soft tissue restrictions Scar tissue mobilization:to lower abdomen to work through restrictions and educated patient on how to perform manual work to the scar.      PATIENT EDUCATION: 11/20/2022 Education details: Access Code: [email or web address] Person educated: Patient Education method: Customer service manager Education comprehension: verbalized understanding and returned demonstration       HOME EXERCISE PROGRAM: 11/20/2022 Access Code: [email or web address] URL: [email or web address] Date: 11/20/2022 Prepared by: Earlie Counts  Program Notes sit on the tennis ball and massage the pelvic floor  Exercises - Seated Diaphragmatic Breathing  - 3 x daily - 7 x weekly - 1 sets - 10 reps - Seated Piriformis Stretch with Trunk Bend  - 1 x daily - 7 x weekly - 1 sets - 2 reps - 30 sec hold - Seated Hamstring Stretch  - 1 x daily - 7 x weekly - 1 sets - 2 reps - 30 sec hold - Seated Hip Adductor Stretch  - 1 x daily - 7 x weekly - 1 sets - 2 reps - 30 sec hold - Seated Happy Baby With Trunk Flexion For Pelvic Relaxation  - 1 x daily - 7 x weekly - 1 sets - 1 reps - 30 sec hold - Seated Cat Cow  - 1 x daily - 7 x weekly - 1 sets - 10 reps   ASSESSMENT:   CLINICAL IMPRESSION: Patient is a 56 y.o. female who was seen today for physical therapy treatment for overactive bladder, stress incontinence, and levator spasm. Pelvic floor strength increased to 2/5. She is reporting vaginal burning itching and pain level at 6/10. Therapist observed white thick discharge on the vulvar area and labia minora. Her urethra and vaginal canal was red and irritated. Therapist educated patient on breathing to relax the pelvic floor for her pain and hip stretches. She had a thyroidectomy last  week due to Thyroid cancer.   Patient will benefit from skilled therapy to improve pelvic floor coordination and reduce lower abdominal pain.      OBJECTIVE IMPAIRMENTS decreased activity tolerance, decreased coordination, decreased endurance, decreased mobility, decreased ROM, decreased strength, increased fascial restrictions,  increased muscle spasms, and pain.    ACTIVITY LIMITATIONS carrying, lifting, bending, sitting, standing, squatting, sleeping, bed mobility, continence, and toileting   PARTICIPATION LIMITATIONS: cleaning, laundry, and shopping   PERSONAL FACTORS 3+ comorbidities: Abdominal hysterectomy; acute renal failure, diabetes, COPD  are also affecting patient's functional outcome.    REHAB POTENTIAL: Good   CLINICAL DECISION MAKING: Evolving/moderate complexity   EVALUATION COMPLEXITY: Moderate     GOALS: Goals reviewed with patient? Yes   SHORT TERM GOALS: Target date: 11/04/2022   Patient independent with manual work to the lower abdomen to reduce her pain.  Baseline: Goal status: INITIAL   2.  Patient independent with initial HEP for hip stretches.  Baseline:  Goal status: INITIAL   3.  Patient understands how to engage her lower abdominals during transitional movements.  Baseline:  Goal status: INITIAL     LONG TERM GOALS: Target date: 12/30/2022    Patient is independent with advanced HEP for core engagement and pelvic floor strength Baseline:  Goal status: INITIAL   2.  Patient is able to urinate within 5 minutes of sitting on the commode due to relaxation of the pelvic floor musclels.  Baseline:  Goal status: INITIAL   3.  Patient pain level after she urinates decreased </= 1-2 due to the ability for the pelvic floor to relax.  Baseline:  Goal status: INITIAL   4.  Patient reports she leaks 2 times per week and wears 1 pad due to her pelvic floor strength >/= 3/5 holding for 5-10 seconds.  Baseline:  Goal status: INITIAL   5.  Patient is able  to return to a walking program due to her lower abdominal pain decreased </= 1-2/10 and increased in hip strength.  Baseline:  Goal status: INITIAL     PLAN: PT FREQUENCY: 1x/week   PT DURATION: 12 weeks   PLANNED INTERVENTIONS: Therapeutic exercises, Therapeutic activity, Neuromuscular re-education, Patient/Family education, Self Care, Joint mobilization, Dry Needling, Electrical stimulation, Cryotherapy, Moist heat, Ultrasound, Biofeedback, and Manual therapy   PLAN FOR NEXT SESSION: Manual work to abdomen and educated patient, manual work to the right levator and obturator; see what MD says   Earlie Counts, PT 11/20/22 4:59 PM

## 2022-11-21 ENCOUNTER — Encounter: Payer: Self-pay | Admitting: Obstetrics and Gynecology

## 2022-11-21 ENCOUNTER — Ambulatory Visit (INDEPENDENT_AMBULATORY_CARE_PROVIDER_SITE_OTHER): Payer: Medicare Other | Admitting: Obstetrics and Gynecology

## 2022-11-21 ENCOUNTER — Other Ambulatory Visit (HOSPITAL_COMMUNITY)
Admission: RE | Admit: 2022-11-21 | Discharge: 2022-11-21 | Disposition: A | Discharge status: Home / Self Care | Payer: Medicare Other | Source: Ambulatory Visit | Attending: Obstetrics and Gynecology | Admitting: Obstetrics and Gynecology

## 2022-11-21 ENCOUNTER — Telehealth: Payer: Self-pay | Admitting: Obstetrics and Gynecology

## 2022-11-21 VITALS — BP 128/90 | HR 60

## 2022-11-21 DIAGNOSIS — N898 Other specified noninflammatory disorders of vagina: Secondary | ICD-10-CM

## 2022-11-21 DIAGNOSIS — N393 Stress incontinence (female) (male): Secondary | ICD-10-CM | POA: Diagnosis not present

## 2022-11-21 DIAGNOSIS — E118 Type 2 diabetes mellitus with unspecified complications: Secondary | ICD-10-CM | POA: Diagnosis not present

## 2022-11-21 DIAGNOSIS — L900 Lichen sclerosus et atrophicus: Secondary | ICD-10-CM | POA: Diagnosis not present

## 2022-11-21 DIAGNOSIS — I10 Essential (primary) hypertension: Secondary | ICD-10-CM

## 2022-11-21 MED ORDER — CLOBETASOL PROP EMOLLIENT BASE 0.05 % EX CREA: 1.0000 | TOPICAL_CREAM | Freq: Every evening | CUTANEOUS | 2 refills | Status: DC

## 2022-11-21 MED ORDER — FLUCONAZOLE 150 MG PO TABS: 150.0000 mg | ORAL_TABLET | Freq: Once | ORAL | 0 refills | Status: AC

## 2022-11-21 NOTE — Telephone Encounter (Signed)
Called and left a VM for patient as received a message from Physical Therapy that patient had some discharge that may need to be addressed. Left Vm for patient to call back for an appointment.

## 2022-11-21 NOTE — Patient Instructions (Addendum)
Please continue to try and decrease bladder irritants (Diet [facility] included) and drink mostly water  Once your blood sugar is under controla (A1c between 6-7 preferred) we can discuss surgical options including prolapse repair and treatment for your stress incontinence (Leaking with cough/sneeze/picking things up)  Use the cream once nightly for the next 30 days  Take the 1 dose of Diflucan for presumed yeast in the vaginal tract  I will see you in 3 months but call sooner if your itching has not improved. Continue with PT as this will also help you.

## 2022-11-21 NOTE — Progress Notes (Addendum)
[facility]   Subjective:     Chief Complaint:  Chief Complaint  Patient presents with   Follow-up   History of Present Illness: [name] is a 56 y.o. female with stage II pelvic organ prolapse who presents for a pessary check. She is using a size #3 incontinence ring with support pessary. The pessary has been working well and she has no complaints. She is not using vaginal estrogen. She denies vaginal bleeding.  Endorses pelvic pain and white vaginal d/c. Reports she wants the pessary out but does not want to be urinating on herself.   She reports itching into the perineal area.   Past Medical History: Patient  has a past medical history of Acute diverticulitis, Acute metabolic encephalopathy, Acute pain of right knee (10/14/2016), Acute renal failure (ARF) ([place]) (01/26/2019), Anemia, Arthritis, Chest pain, Chronic back pain, Colon polyps, COPD with asthma, Depression, Diabetes mellitus without complication ([place]), Diabetic neuropathy ([place]), Diastolic dysfunction, Diverticulitis large intestine (05/12/2020), Dyspnea, GERD (gastroesophageal reflux disease), Heart murmur, Hyperlipidemia, Hypertension, Lipoma of left upper extremity, Palpitations, Thyroid cancer ([place]), and Urinary incontinence.   Past Surgical History: She  has a past surgical history that includes Abdominal hysterectomy; Tubal ligation; Colonoscopy with propofol (N/A, 10/02/2021); Esophagogastroduodenoscopy (N/A, 10/02/2021); transthoracic echocardiogram (09/29/2015); Coronary CT angiogram (01/03/2022); transthoracic echocardiogram (01/09/2022); 7-Day Zio Patch Monitor (12/2021); Lipoma excision (Left, 07/08/2022); and Thyroidectomy (Bilateral, 11/07/2022).   Medications: She has a current medication list which includes the following prescription(s): albuterol, albuterol, amlodipine, atorvastatin, budesonide-formoterol, vitamin d3, clobetasol prop emollient base, diclofenac sodium, fluconazole, ibuprofen,  insulin glargine-yfgn, loratadine, losartan, meclizine, metformin, methocarbamol, metoprolol tartrate, omeprazole, rybelsus, sertraline, and gabapentin.   Allergies: Patient has No Known Allergies.   Social History: Patient  reports that she quit smoking about 17 years ago. Her smoking use included cigarettes. She has a 30.00 pack-year smoking history. She has never used smokeless tobacco. She reports that she does not drink alcohol and does not use drugs.      Objective:     Recently hospitalized and had an A1c done at that time that showed A1c 10.7 on 09/25/22  Also recently had a total thyroidectomy due to thyroid malignancy.   Physical Exam: BP (!) 128/90 (BP Location: Left Arm, Patient Position: Sitting, Cuff Size: Normal)   Pulse 60  Gen: No apparent distress, A&O x 3. Detailed Urogynecologic Evaluation:  Pelvic Exam: Normal external female genitalia; Bartholin's and Skene's glands normal in appearance; urethral meatus normal in appearance, no urethral masses or discharge. The pessary was noted to be in place. It was removed and cleaned. Speculum exam revealed no lesions in the vagina. The pessary was replaced. It was comfortable to the patient and fit well.   Patient has a symmetrical and bilateral whitening of the skin above the clitoris and down into the labia.     Assessment/Plan:    Assessment: [name] is a 56 y.o. with stage II pelvic organ prolapse here for a pessary check. She is doing well.  Plan:  Diabetes: She will work on controlling her diabetes. Explained that surgical talk will occur once her diabetes is under control. Goal is an A1c between 6-7 for better surgical healing and surgical safety. Once this is met we can discuss options for prolapse repair and SUI treatment.   OAB: Down from 24 sodas a day to she reports 3 mountain dews per day. Plan to continue the PT.   SUI: Continue using the pessary at this time.   Probable Yeast:  Take the 1 time dose  of Diflucan and call the office if itching is still occurring. Aptima swab sent to the lab for testing.   [place] Sclerosus: Start Clobetasol nightly for the next 30 days.   BP: Of note, patient had an elevated BP reading in office, upon recheck the BP was still slightly elevated but more within range. Patient reports she will follow up with PCP.   She will keep the pessary in place until next visit. She will follow-up in 3 months for a pessary check or sooner as needed.

## 2022-11-24 ENCOUNTER — Telehealth: Payer: Self-pay | Admitting: Obstetrics and Gynecology

## 2022-11-24 LAB — CERVICOVAGINAL ANCILLARY ONLY
Bacterial Vaginitis (gardnerella): NEGATIVE
Candida Glabrata: NEGATIVE
Candida Vaginitis: POSITIVE — AB
Comment: NEGATIVE
Comment: NEGATIVE
Comment: NEGATIVE

## 2022-11-24 NOTE — Telephone Encounter (Signed)
Called to give patient results and no answer. Left HIPAA compliant voice message. Patient positive for yeast and was prescribed Diflucan at last visit due to visibility of yeast in the vaginal tract.

## 2022-11-25 ENCOUNTER — Telehealth: Payer: Self-pay | Admitting: Obstetrics and Gynecology

## 2022-11-25 NOTE — Telephone Encounter (Signed)
Patient called regarding her results. Results of positive yeast infection given over the phone and patient reports she did take the diflucan and is doing the clobetasol cream. She reports relief from vaginal itching.

## 2022-11-26 ENCOUNTER — Inpatient Hospital Stay: Payer: Medicare Other | Attending: Oncology | Admitting: Oncology

## 2022-11-26 ENCOUNTER — Encounter: Payer: Self-pay | Admitting: Oncology

## 2022-11-26 VITALS — BP 151/106 | HR 77 | Temp 98.2°F | Resp 16 | Ht 58.5 in | Wt 150.1 lb

## 2022-11-26 DIAGNOSIS — Z79899 Other long term (current) drug therapy: Secondary | ICD-10-CM | POA: Diagnosis not present

## 2022-11-26 DIAGNOSIS — Z7189 Other specified counseling: Secondary | ICD-10-CM

## 2022-11-26 DIAGNOSIS — C73 Malignant neoplasm of thyroid gland: Secondary | ICD-10-CM | POA: Diagnosis present

## 2022-11-26 DIAGNOSIS — Z87891 Personal history of nicotine dependence: Secondary | ICD-10-CM | POA: Diagnosis not present

## 2022-11-26 NOTE — Progress Notes (Signed)
Hematology/Oncology Consult note [facility]  Telephone:([phone] [phone]  Patient Care Team: [name], MD as PCP - General (Internal Medicine) Leonie Man, MD as PCP - Cardiology (Cardiology) Tyler Pita, MD as Consulting Physician (Pulmonary Disease)   Name of the patient: [name]  [id number]  September 01, 1966   Date of visit: 11/26/22  Diagnosis-stage I multifocal papillary thyroid carcinoma  Chief complaint/ Reason for visit-discuss pathology results and further management  Heme/Onc history: Patient is a 56 year old female with a past medical history significant for hypertension hyperlipidemia type 2 diabetes who underwent ultrasound thyroid in July 2023 which showed 2.1 cm right inferior thyroid nodule which meets criteria for FNA.  FNA shows Beth Seda criteria 5 suspicious for malignancy.  While the features are suspicious for papillary thyroid carcinoma interpretation is limited by low cellularity.  Patient also had a CT chest abdomen and pelvis with contrast in July 2023 which otherwise did not show any evidence of malignancy 2.8 cm left adrenal mass stable from prior exams and no dedicated follow-up recommended   Patient underwent total thyroidectomy by Dr. Daisy Blossom.  Final pathology showed multifocal disease papillary carcinoma encapsulated classic subtype.  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  There were 2 distinct specimens 1 which was 1.3 cm with a mitotic rate of less than 3 per 2 mm.  No evidence of tumor necrosis angiolymphatic invasion or lymphovascular invasion.  Extrathyroidal extension not identified.  Margins negative.  The second tumor measured 1.9 cm and was a widely invasive encapsulated follicular variant of papillary thyroid carcinoma with favorable features.  Regional lymph nodes not sampled.  Prior to surgery patient underwent CT soft tissue neck which did not show any  evidence of lymph node involvement  Interval history- Patient reports some difficulty swallowing since her thyroid surgery but otherwise denies other complaints at this time ECOG PS- 1 6 Pain scale- 2   Review of systems- Review of Systems  Constitutional:  Positive for malaise/fatigue. Negative for chills, fever and weight loss.  HENT:  Negative for congestion, ear discharge and nosebleeds.   Eyes:  Negative for blurred vision.  Respiratory:  Negative for cough, hemoptysis, sputum production, shortness of breath and wheezing.   Cardiovascular:  Negative for chest pain, palpitations, orthopnea and claudication.  Gastrointestinal:  Negative for abdominal pain, blood in stool, constipation, diarrhea, heartburn, melena, nausea and vomiting.  Genitourinary:  Negative for dysuria, flank pain, frequency, hematuria and urgency.  Musculoskeletal:  Negative for back pain, joint pain and myalgias.  Skin:  Negative for rash.  Neurological:  Negative for dizziness, tingling, focal weakness, seizures, weakness and headaches.  Endo/Heme/Allergies:  Does not bruise/bleed easily.  Psychiatric/Behavioral:  Negative for depression and suicidal ideas. The patient does not have insomnia.       No Known Allergies   Past Medical History:  Diagnosis Date   Acute diverticulitis    Acute metabolic encephalopathy    Acute pain of right knee 10/14/2016   Acute renal failure (ARF) ([place]) 01/26/2019   Anemia    Arthritis    Chest pain    a. 12/2021 Cor CTA: LM nl, LAD <25%, LCX nl, RCA nl. Cor Ca2+ = 1.29.   Chronic back pain    Complicated by neuropathy. ->  On Neurontin and duloxetine along with Flexeril and Voltaren gel.  Also uses PRN tramadol.   Colon polyps    COPD with asthma    On combination of albuterol Qvar and  Symbicort   Depression    Diabetes mellitus without complication ([place])    type 2   Diabetic neuropathy (HCC)    Diastolic dysfunction    a. 09/2015 Echo: EF 55-60%, GrI DD; b. 12/2021  Echo: EF 60-65%, no rwma, GrI DD, nl RV fxn. RVSP 28.16mHg. Mild MR.   Diverticulitis large intestine 05/12/2020   Dyspnea    GERD (gastroesophageal reflux disease)    Heart murmur    Hyperlipidemia    Hypertension    Lipoma of left upper extremity    Palpitations    a. 12/2021 Zio: Predominantly sinus rhythm at 80 (38-136).  Frequent PACs (5.3%).  5 short runs of atrial tachycardia (longest 33 beats at 119 bpm.  Fastest 160 bpm).  Triggered events associated PACs and short runs of PAT.   Th[facility]roid [facility] ([facility]    Urinary incontinence    Pessary present     Past Surgical History:  Procedure Laterality Date   7-Day Zio Patch Monitor  12/2021   Predominant sinus rhythm.  Rate range 38-136 bpm with an average of 80 bpm (bradycardia only noted during hours of sleep.  5 Atrial Runs-fastest was 5 beats at a max rate 162 bpm, longest was 17.2 seconds/33 beats at a rate of 119 bpm--not noted on patient trigger.  Frequent PACs noted (5.3%).  Rare PVCs.   ABDOMINAL HYSTERECTOMY     COLONOSCOPY WITH PROPOFOL N/A 10/02/2021   Procedure: COLONOSCOPY WITH PROPOFOL;  Surgeon: VLin Landsman MD;  [facility]  Service: Gastroenterology;  Laterality: N/A;   Coronary CT angiogram  01/03/2022   Thoracic aortic atherosclerosis.  Coronary Calcium Score 1.29.  Minimal proximal LAD calcification. (<25%).   ESOPHAGOGASTRODUODENOSCOPY N/A 10/02/2021   Procedure: ESOPHAGOGASTRODUODENOSCOPY (EGD);  Surgeon: VLin Landsman MD;  [facility];  Service: Gastroenterology;  Laterality: N/A;   LIPOMA EXCISION Left 07/08/2022   upper arm   THYROIDECTOMY Bilateral 11/07/2022   Procedure: TOTAL THYROIDECTOMY;  Surgeon: JMargaretha Sheffield MD;  Location: [facility] ORS;  Service: ENT;  Laterality: Bilateral;   TRANSTHORACIC ECHOCARDIOGRAM  09/29/2015   EF 55-60%.  No R WMA.  GR 1 DD.  Is also normal valves.  Normal study.   TRANSTHORACIC ECHOCARDIOGRAM  01/09/2022   EF 60 to 65%.  Mild to  moderate LVH.  G1 DD.  Normal RV size and function.  Normal atrial sizes.  Normal valves.  NORMAL ECHO   TUBAL LIGATION      Social History   Socioeconomic History   Marital status: Single    Spouse name: Not on file   Number of children: 3   Years of education: Not on file   Highest education level: Not on file  Occupational History   Not on file  Tobacco Use   Smoking status: Former    Packs/day: 3.00    Years: 10.00    Total pack years: 30.00    Types: Cigarettes    Quit date: 12/22/2004    Years since quitting: 17.9   Smokeless tobacco: Never  Vaping Use   Vaping Use: Never used  Substance and Sexual Activity   Alcohol use: No   Drug use: No   Sexual activity: Not Currently  Other Topics Concern   Not on file  Social History Narrative   [place]    Lives alone   Social Determinants of Health  Financial Resource Strain: Not on file  Food Insecurity: No Food Insecurity (11/07/2022)   Hunger Vital Sign    Worried About Running Out of Food in the Last Year: Never true    Ran Out of Food in the Last Year: Never true  Transportation Needs: No Transportation Needs (11/07/2022)   PRAPARE - Hydrologist (Medical): No    Lack of Transportation (Non-Medical): No  Physical Activity: Not on file  Stress: Not on file  Social Connections: Not on file  Intimate Partner Violence: Not At Risk (11/07/2022)   Humiliation, Afraid, Rape, and Kick questionnaire    Fear of Current or Ex-Partner: No    Emotionally Abused: No    Physically Abused: No    Sexually Abused: No    Family History  Problem Relation Age of Onset   Hypertension Mother    Other Other      Current Outpatient Medications:    albuterol (PROVENTIL) (2.5 MG/3ML) 0.083% nebulizer solution, Take 3 mLs (2.5 mg total) by nebulization every 2 (two) hours as needed for wheezing or  shortness of breath., Disp: 75 mL, Rfl: 0   albuterol (VENTOLIN HFA) 108 (90 Base) MCG/ACT inhaler, Inhale 2 puffs into the lungs every 6 (six) hours as needed for wheezing or shortness of breath., Disp: 8 g, Rfl: 2   amLODipine (NORVASC) 10 MG tablet, Take 1 tablet (10 mg total) by mouth daily., Disp: 30 tablet, Rfl: 0   atorvastatin (LIPITOR) 10 MG tablet, Take 10 mg by mouth daily., Disp: , Rfl:    budesonide-formoterol (SYMBICORT) 160-4.5 MCG/ACT inhaler, Inhale 2 puffs into the lungs 2 (two) times daily., Disp: 1 each, Rfl: 12   Cholecalciferol (VITAMIN D3) 1.25 MG (50000 UT) CAPS, Take 1 capsule by mouth once a week. wednesday, Disp: , Rfl:    Clobetasol Prop Emollient Base (CLOBETASOL PROPIONATE E) 0.05 % emollient cream, Apply 1 Application topically at bedtime., Disp: 30 g, Rfl: 2   diclofenac sodium (VOLTAREN) 1 % GEL, Apply 2 g topically 4 (four) times daily as needed (pain)., Disp: 1 Tube, Rfl: 0   gabapentin (NEURONTIN) 300 MG capsule, Take 1 capsule (300 mg total) by mouth 2 (two) times daily for 15 days., Disp: 30 capsule, Rfl: 0   ibuprofen (ADVIL) 800 MG tablet, Take 800 mg by mouth every 8 (eight) hours as needed., Disp: , Rfl:    insulin glargine-yfgn (SEMGLEE) 100 UNIT/ML injection, Inject 0.2 mLs (20 Units total) into the skin daily., Disp: 10 mL, Rfl: 0   loratadine (CLARITIN) 10 MG tablet, Take 1 tablet (10 mg total) by mouth daily., Disp: 30 tablet, Rfl: 0   losartan (COZAAR) 100 MG tablet, Take 1 tablet (100 mg total) by mouth daily., Disp: 90 tablet, Rfl: 3   meclizine (ANTIVERT) 25 MG tablet, Take 1 tablet (25 mg total) by mouth 3 (three) times daily as needed for dizziness., Disp: 30 tablet, Rfl: 0   metFORMIN (GLUCOPHAGE) 1000 MG tablet, Take 1 tablet (1,000 mg total) by mouth 2 (two) times daily with a meal. See discharge instructions for how to take medicine at first, Disp: 60 tablet, Rfl: 1   methocarbamol (ROBAXIN) 500 MG tablet, TAKE 1 TABLET BY MOUTH EVERY 6 HOURS AS  NEEDED FOR MUSCLE SPASMS, Disp: 30 tablet, Rfl: 0   metoprolol tartrate (LOPRESSOR) 50 MG tablet, Take 2 tablets (100 mg total) by mouth 2 (two) times daily., Disp: , Rfl:    omeprazole (PRILOSEC) 40 MG capsule, Take 1  capsule (40 mg total) by mouth daily., Disp: 30 capsule, Rfl: 2   Semaglutide (RYBELSUS) 7 MG TABS, Take by mouth daily., Disp: , Rfl:    sertraline (ZOLOFT) 50 MG tablet, Take 1.5 tablets (75 mg total) by mouth every morning., Disp: 45 tablet, Rfl: 0  Physical exam:  Vitals:   11/26/22 0856  BP: (!) 151/106  Pulse: 77  Resp: 16  Temp: 98.2 F (36.8 C)  TempSrc: Oral  Weight: 150 lb 1.6 oz (68.1 kg)  Height: 4' 10.5" (1.486 m)   Physical Exam Constitutional:      General: She is not in acute distress. Neck:     Comments: Midline transfer scar of recent thyroidectomy surgery is healing well Cardiovascular:     Rate and Rhythm: Normal rate and regular rhythm.     Heart sounds: Normal heart sounds.  Pulmonary:     Effort: Pulmonary effort is normal.     Breath sounds: Normal breath sounds.  Abdominal:     General: Bowel sounds are normal.     Palpations: Abdomen is soft.  Skin:    General: Skin is warm and dry.  Neurological:     Mental Status: She is alert and oriented to person, place, and time.         Latest Ref Rng & Units 11/08/2022    4:54 AM  CMP  Calcium 8.9 - 10.3 mg/dL 9.2       Latest Ref Rng & Units 09/26/2022    1:03 AM  CBC  WBC 4.0 - 10.5 K/uL 7.2   Hemoglobin 12.0 - 15.0 g/dL 11.7   Hematocrit 36.0 - 46.0 % 36.1   Platelets 150 - 400 K/uL 234      Assessment and plan- Patient is a 56 y.o. female with stage I pT1b N0 M0 papillary thyroid carcinoma s/p total thyroidectomy here to discuss final pathology results and further management  Discussed results of total thyroidectomy which showed multifocal papillary thyroid carcinoma with negative margins.  No evidence of angiolymphatic or lymphovascular invasion.  No tumor necrosis present.   There were 2 distinct foci 1 measuring 1.3 cm and the other measuring 1.9 cm.  The 1.9 cm specimen was a follicular variant of papillary thyroid carcinoma.  It would be reasonable to consider adjuvant radioactive iodine for her given multifocal disease and I am getting in touch with Dr. Ilda Foil from radiology to arrange that.  With regards to future follow-up of her thyroid cancer as per my conversation with Dr. Daisy Blossom, he is under the impression that Dr. Rosario Jacks is going to be following her up as well as managing her post thyroidectomy thyroid replacement.  I will get in touch with Dr. [name] Heck about it.  Follow-up with me to be decided based on further plans by radiology and Dr. Rosario Jacks.   Cancer Staging  Papillary thyroid carcinoma [facility]) Staging form: Thyroid - Differentiated, AJCC 8th Edition - Pathologic stage from 11/26/2022: Stage I (pT1b, pN0, cM0, Age at diagnosis: >= 55 years) - Signed by Sindy Guadeloupe, MD on 11/26/2022 Stage prefix: Initial diagnosis Lymph node metastasis: Absent     Visit Diagnosis 1. Papillary thyroid carcinoma ([place])   2. Goals of care, counseling/discussion      Dr. Randa Evens, MD, MPH [facility] at [facility] [phone] 11/26/2022 12:49 PM

## 2022-11-27 ENCOUNTER — Encounter: Payer: Medicare Other | Attending: Obstetrics and Gynecology | Admitting: Physical Therapy

## 2022-11-27 ENCOUNTER — Other Ambulatory Visit: Payer: Self-pay | Admitting: Oncology

## 2022-11-27 ENCOUNTER — Encounter: Payer: Self-pay | Admitting: Physical Therapy

## 2022-11-27 DIAGNOSIS — R252 Cramp and spasm: Secondary | ICD-10-CM | POA: Diagnosis present

## 2022-11-27 DIAGNOSIS — C73 Malignant neoplasm of thyroid gland: Secondary | ICD-10-CM

## 2022-11-27 DIAGNOSIS — R1030 Lower abdominal pain, unspecified: Secondary | ICD-10-CM | POA: Diagnosis present

## 2022-11-27 NOTE — Therapy (Signed)
OUTPATIENT PHYSICAL THERAPY TREATMENT NOTE   Patient Name: [name] MRN: [id number] DOB:Apr 23, 1966, 56 y.o., female Today's Date: 11/27/2022  PCP:  [name], MD  REFERRING PROVIDER: Jaquita Folds, MD    END OF SESSION:   PT End of Session - 11/27/22 1143     Visit Number 4    Date for PT Re-Evaluation 12/16/22    Authorization Type Medicare    Authorization - Visit Number 4    Authorization - Number of Visits 10    PT Start Time 1130    PT Stop Time 1215    PT Time Calculation (min) 45 min    Activity Tolerance Patient tolerated treatment well    Behavior During Therapy [facility] for tasks assessed/performed             Past Medical History:  Diagnosis Date   Acute diverticulitis    Acute metabolic encephalopathy    Acute pain of right knee 10/14/2016   Acute renal failure (ARF) ([place]) 01/26/2019   Anemia    Arthritis    Chest pain    a. 12/2021 Cor CTA: LM nl, LAD <25%, LCX nl, RCA nl. Cor Ca2+ = 1.29.   Chronic back pain    Complicated by neuropathy. ->  On Neurontin and duloxetine along with Flexeril and Voltaren gel.  Also uses PRN tramadol.   Colon polyps    COPD with asthma    On combination of albuterol Qvar and Symbicort   Depression    Diabetes mellitus without complication ([place])    type 2   Diabetic neuropathy (HCC)    Diastolic dysfunction    a. 09/2015 Echo: EF 55-60%, GrI DD; b. 12/2021 Echo: EF 60-65%, no rwma, GrI DD, nl RV fxn. RVSP 28.41mHg. Mild MR.   Diverticulitis large intestine 05/12/2020   Dyspnea    GERD (gastroesophageal reflux disease)    Heart murmur    Hyperlipidemia    Hypertension    Lipoma of left upper extremity    Palpitations    a. 12/2021 Zio: Predominantly sinus rhythm at 80 (38-136).  Frequent PACs (5.3%).  5 short runs of atrial tachycardia (longest 33 beats at 119 bpm.  Fastest 160 bpm).  Triggered events associated PACs and short runs of PAT.   Thyroid cancer ([facility]    Urinary incontinence    Pessary  present   Past Surgical History:  Procedure Laterality Date   7-Day Zio Patch Monitor  12/2021   Predominant sinus rhythm.  Rate range 38-136 bpm with an average of 80 bpm (bradycardia only noted during hours of sleep.  5 Atrial Runs-fastest was 5 beats at a max rate 162 bpm, longest was 17.2 seconds/33 beats at a rate of 119 bpm--not noted on patient trigger.  Frequent PACs noted (5.3%).  Rare PVCs.   ABDOMINAL HYSTERECTOMY     COLONOSCOPY WITH PROPOFOL N/A 10/02/2021   Procedure: COLONOSCOPY WITH PROPOFOL;  Surgeon: VLin Landsman MD;  [facility];  Service: Gastroenterology;  Laterality: N/A;   Coronary CT angiogram  01/03/2022   Thoracic aortic atherosclerosis.  Coronary Calcium Score 1.29.  Minimal proximal LAD calcification. (<25%).   ESOPHAGOGASTRODUODENOSCOPY N/A 10/02/2021   Procedure: ESOPHAGOGASTRODUODENOSCOPY (EGD);  Surgeon: VLin Landsman MD;  [facility];  Service: Gastroenterology;  Laterality: N/A;   LIPOMA EXCISION Left 07/08/2022   upper arm   THYROIDECTOMY Bilateral 11/07/2022   Procedure: TOTAL THYROIDECTOMY;  Surgeon: JMargaretha Sheffield MD;  Location: [facility] ORS;  Service: ENT;  Laterality: Bilateral;  TRANSTHORACIC ECHOCARDIOGRAM  09/29/2015   EF 55-60%.  No R WMA.  GR 1 DD.  Is also normal valves.  Normal study.   TRANSTHORACIC ECHOCARDIOGRAM  01/09/2022   EF 60 to 65%.  Mild to moderate LVH.  G1 DD.  Normal RV size and function.  Normal atrial sizes.  Normal valves.  NORMAL ECHO   TUBAL LIGATION     Patient Active Problem List   Diagnosis Date Noted   S/P total thyroidectomy 11/07/2022   Vertigo 09/26/2022   Left-sided weakness 09/26/2022   DM (diabetes mellitus), type 2 with complications ([place]) 51/76/1607   Papillary thyroid carcinoma ([place]) 37/09/6268   Acute metabolic encephalopathy 48/54/6270   Lumbar strain 08/13/2022   Lipoma of left upper extremity    Exercise intolerance 12/26/2021   Heart murmur 12/26/2021   DOE  (dyspnea on exertion) 12/26/2021   Palpitations 12/26/2021   Abdominal bloating    Polyp of colon    Multiple adenomatous polyps    Depression    Hyperlipidemia associated with type 2 diabetes mellitus ([place])    GERD (gastroesophageal reflux disease)    Influenza A 01/26/2019   Hypokalemia 06/12/2017   Marijuana use 06/12/2017   Diabetic neuropathy ([place]) 01/07/2016   Plantar fasciitis of left foot 11/26/2015   Back pain 10/10/2015   Asthma 09/29/2015   Essential hypertension 09/29/2015   DM (diabetes mellitus) type II controlled, neurological manifestation ([place]) 09/29/2015   REFERRING DIAG:  N32.81 (ICD-10-CM) - Overactive bladder  N39.3 (ICD-10-CM) - SUI (stress urinary incontinence, female)  M62.838 (ICD-10-CM) - Levator spasm      THERAPY DIAG:  Cramp and spasm   Lower abdominal pain   Rationale for Evaluation and Treatment Rehabilitation   ONSET DATE: 2022   SUBJECTIVE:                                                                                                                                                                                            SUBJECTIVE STATEMENT: I am not having the burning pain or white discharge since on the medicine for the yeast infection. NO pain in the pelvic region.  Still has to sit on the commode  for 20 minutes to fully empty the bladder. Patient found out she has pancreatic cancer and will be having chemotherapy and radiation.    PAIN:  Are you having pain? No NPRS scale: 0/10 Pain location:  suprapubic    Pain type: pins Pain description: intermittent    Aggravating factors: urinate, standing or sitting too long, walk a long distance Relieving factors: walk, medication   PRECAUTIONS: None   WEIGHT BEARING RESTRICTIONS No  FALLS:  Has patient fallen in last 6 months? Yes. Number of falls 5 due to her knee giving out and not due to balance   LIVING ENVIRONMENT: Lives with: lives alone   OCCUPATION: none   PLOF:  Independent   PATIENT GOALS reduce pain and prior function   PERTINENT HISTORY:  Abdominal hysterectomy; acute renal failure, diabetes, COPD   BOWEL MOVEMENT Pain with bowel movement: No and no issues with BM   URINATION Pain with urination: Yes, sits for 15 minutes for the urine stream to come. She then will urinate and have pain.  Fully empty bladder: Yes: if she sits for 20 minutes Stream:  medium Urgency: Yes:   Frequency: every 2 hours during the day, gets up 3-4 times at night Leakage: Urge to void, Walking to the bathroom, Coughing, Sneezing, Laughing, Lifting, and Bending forward Pads: Yes: 2 pads  during the day and 1 long pad at night   INTERCOURSE not sexually active   PROLAPSE Stage II anterior, Stage II posterior, Stage I apical prolapse       OBJECTIVE:    DIAGNOSTIC FINDINGS:  PVR of 56 ml was obtained by bladder scan. Pelvic floor strength III/V, Pelvic floor musculature: Right levator non-tender, Right obturator tender, Left levator non-tender, Left obturator tender     COGNITION:            Overall cognitive status: Within functional limits for tasks assessed                          SENSATION:            Light touch: Appears intact            Proprioception: Appears intact   Patie              POSTURE: rounded shoulders, forward head, and flexed trunk                PELVIC ALIGNMENT:   LUMBARAROM/PROM   A/PROM A/PROM  eval  Flexion full  Extension Decreased by 25%  Right lateral flexion Decreased by 25%  Left lateral flexion full  Right rotation Decreased by 25%  Left rotation full   (Blank rows = not tested)   LOWER EXTREMITY ROM:   Passive ROM Right eval Left eval  Hip external rotation 65 45   (Blank rows = not tested)   LOWER EXTREMITY MMT:   MMT Right eval Left eval  Hip flexion   4-/5  Hip extension 4/5 4/5  Hip abduction 4/5 4/5  Hip adduction 4/5 4/5     PALPATION:   General  tenderness located on the scar,  suprapubic bone and lower abdominal                 External Perineal Exam no tenderness                             Internal Pelvic Floor tenderness located on the right obturator internist and levator ani   Patient confirms identification and approves PT to assess internal pelvic floor and treatment Yes   PELVIC MMT:   MMT eval  Vaginal 1/5  (Blank rows = not tested)         TONE: Increased on the right   PROLAPSE: Patient had pessary in   TODAY'S TREATMENT  11/27/2022 Manual: Soft tissue mobilization: Manual work to the suprapubic area just below  the c-section scar Manual work to the ischiocavernosus bilaterally through her clothes due to recent yeast infection.  Scar tissue mobilization: Manual work to the c-section scar to increased mobility and educated patient on scar massage at home.  Myofascial release: Fascial release to the right lower quadrant to reduce fascial restrictions Exercises: Stretches/mobility: Happy baby in sitting holding 30 sec Hip adductor stretch in standing holding 30 sec each Supine hamstring strength holding 30 sec each    11/20/2022 Manual: Soft tissue mobilization: Sit on tennis ball and massage the pelvic floor muscles.  Internal pelvic floor techniques:No emotional/communication barriers or cognitive limitation. Patient is motivated to learn. Patient understands and agrees with treatment goals and plan. PT explains patient will be examined in standing, sitting, and lying down to see how their muscles and joints work. When they are ready, they will be asked to remove their underwear so PT can examine their perineum. The patient is also given the option of providing their own chaperone as one is not provided in our facility. The patient also has the right and is explained the right to defer or refuse any part of the evaluation or treatment including the internal exam. With the patient's consent, PT will use one gloved finger to gently assess  the muscles of the pelvic floor, seeing how well it contracts and relaxes and if there is muscle symmetry. After, the patient will get dressed and PT and patient will discuss exam findings and plan of care. PT and patient discuss plan of care, schedule, attendance policy and HEP activities.  Place index finger into the vaginal canal to assess strength and see where her pessary is. The pessary is in the canal and she did not have tenderness with light touch. White discharge was covering the labia minora and the vulvar area. The area around the urethra and the vaginal canal was red and irritated. Patient reports she has been scratching due to the itching and burning feeling she is having.  Neuromuscular re-education: Down training: Diaphragmatic breathing to relax the pelvic floor Exercises: Stretches/mobility: Piriformis stretch in sitting holding for 30 sec bil.  Hamstring stretch in sitting holding for 30 sec bil.  Sitting hip adductor stretch holding 30 sec each Sitting happy baby stretch holding for 30 sec Sitting cat camel 15x 10/23/2022 Manual: Soft tissue mobilization: to the lower abdomen to work through the tenderness, and soft tissue restrictions Scar tissue mobilization:to lower abdomen to work through restrictions and educated patient on how to perform manual work to the scar.      PATIENT EDUCATION: 10/23/2022 Education details: educated patient on how to perform lower abdominal massage and scar massage Person educated: Patient Education method: Customer service manager Education comprehension: verbalized understanding and returned demonstration       HOME EXERCISE PROGRAM: none   ASSESSMENT:   CLINICAL IMPRESSION: Patient is a 56 y.o. female who was seen today for physical therapy treatment for overactive bladder, stress incontinence, and levator spasm. Patient is not having burning or pelvic pain since she has been on the medication for the yeast infection. Patient  had some restriction in the c-section scar, ischiocavernosus, and right lower quadrants. She is not going to have bladder surgery until her diabetes is under control. Patient will benefit from skilled therapy to improve pelvic floor coordination and reduce lower abdominal pain.      OBJECTIVE IMPAIRMENTS decreased activity tolerance, decreased coordination, decreased endurance, decreased mobility, decreased ROM, decreased strength, increased fascial restrictions, increased muscle spasms, and pain.  ACTIVITY LIMITATIONS carrying, lifting, bending, sitting, standing, squatting, sleeping, bed mobility, continence, and toileting   PARTICIPATION LIMITATIONS: cleaning, laundry, and shopping   PERSONAL FACTORS 3+ comorbidities: Abdominal hysterectomy; acute renal failure, diabetes, COPD  are also affecting patient's functional outcome.    REHAB POTENTIAL: Good   CLINICAL DECISION MAKING: Evolving/moderate complexity   EVALUATION COMPLEXITY: Moderate     GOALS: Goals reviewed with patient? Yes   SHORT TERM GOALS: Target date: 11/04/2022   Patient independent with manual work to the lower abdomen to reduce her pain.  Baseline: Goal status: Met 11/27/2022   2.  Patient independent with initial HEP for hip stretches.  Baseline:  Goal status: Met 11/27/2022  3.  Patient understands how to engage her lower abdominals during transitional movements.  Baseline:  Goal status: INITIAL     LONG TERM GOALS: Target date: 12/30/2022    Patient is independent with advanced HEP for core engagement and pelvic floor strength Baseline:  Goal status: INITIAL   2.  Patient is able to urinate within 5 minutes of sitting on the commode due to relaxation of the pelvic floor musclels.  Baseline:  Goal status: INITIAL   3.  Patient pain level after she urinates decreased </= 1-2 due to the ability for the pelvic floor to relax.  Baseline:  Goal status: INITIAL   4.  Patient reports she leaks 2 times per  week and wears 1 pad due to her pelvic floor strength >/= 3/5 holding for 5-10 seconds.  Baseline:  Goal status: INITIAL   5.  Patient is able to return to a walking program due to her lower abdominal pain decreased </= 1-2/10 and increased in hip strength.  Baseline:  Goal status: INITIAL     PLAN: PT FREQUENCY: 1x/week   PT DURATION: 12 weeks   PLANNED INTERVENTIONS: Therapeutic exercises, Therapeutic activity, Neuromuscular re-education, Patient/Family education, Self Care, Joint mobilization, Dry Needling, Electrical stimulation, Cryotherapy, Moist heat, Ultrasound, Biofeedback, and Manual therapy   PLAN FOR NEXT SESSION: M manual work to the right levator and obturator, work on diaphragmatic breathing, abdominal contraction  Earlie Counts, PT 11/27/22 12:26 PM

## 2022-11-28 ENCOUNTER — Telehealth: Payer: Self-pay | Admitting: *Deleted

## 2022-11-28 NOTE — Written Directive (Cosign Needed)
MOLECULAR IMAGING AND THERAPEUTICS WRITTEN DIRECTIVE   PATIENT NAME: [name]  PT DOB:   1966-06-24                                              MRN: [id number]  ---------------------------------------------------------------------------------------------------------------------   I-131 THYROID CANCER THERAPY   RADIOPHARMACEUTICAL:  Iodine-131 Capsule    PRESCRIBED DOSE FOR ADMINISTRATION: 60 mCi   ROUTE OFADMINISTRATION:  PO   DIAGNOSIS: Stage I multifocal papillary thyroid carcinoma   REFERRING PHYSICIAN: Dr. Narda Bonds STIMULATION OR HORMONE WITHDRAW: Hormone Withdraw   REMNANT ABLATION OR ADJUVANT THERAPY: remnant ablation   DATE OF THYROIDECTOMY: 11/07/2022   SURGEON: Dr. Kathyrn Sheriff   TSH:   Lab Results  Component Value Date   TSH 0.408 09/25/2022   TSH 1.060 09/17/2021     PRIOR I-131 THERAPY (Date and Dose):   Pathology:  Cell type: '[]'$   Papillary  '[x]'$   Follicular  '[]'$   [place]   Largest tumor focus:     1.3 cm  Extrathyroidal Extension?     Yes '[]'$   No '[x]'$     Lymphovascular Invasion?  Yes  '[]'$   No  '[x]'$     Margins positive ? Yes '[]'$   No '[x]'$     Lymph nodes positive? Yes '[]'$   No  '[]'$      Per Dr. Elroy Channel note, "regional lymph nodes not sampled."  # positive nodes:  0 # negative nodes:  0   TNM staging: pT:   1b      PN:    x     Mx:x    ADDITIONAL PHYSICIAN COMMENTS/NOTES 56 yo female with multifocal PTX - remnant ablation AUTHORIZED USER SIGNATURE & TIME STAMP: Rennis Golden, MD   12/01/22    9:07 AM

## 2022-11-28 NOTE — Telephone Encounter (Signed)
Called the office of dr. Rosario Jacks. Spoke to crystal and I asked if dr Rosario Jacks to take over the thyroid meds and that Dr. Janese Banks is looking into the radioactive scan if needed. Dr. Janese Banks had heard from Dr. Kathyrn Sheriff that Dr. Rosario Jacks usually takes over after the surgery. Crystal will have him call Janese Banks on monday

## 2022-11-30 ENCOUNTER — Emergency Department (HOSPITAL_COMMUNITY)
Admission: EM | Admit: 2022-11-30 | Discharge: 2022-11-30 | Disposition: A | Discharge status: Home / Self Care | Payer: Medicare Other | Attending: Student | Admitting: Student

## 2022-11-30 DIAGNOSIS — Z794 Long term (current) use of insulin: Secondary | ICD-10-CM | POA: Insufficient documentation

## 2022-11-30 DIAGNOSIS — B029 Zoster without complications: Secondary | ICD-10-CM | POA: Insufficient documentation

## 2022-11-30 DIAGNOSIS — R21 Rash and other nonspecific skin eruption: Secondary | ICD-10-CM | POA: Diagnosis present

## 2022-11-30 MED ORDER — VALACYCLOVIR HCL 1 G PO TABS: 1000.0000 mg | ORAL_TABLET | Freq: Three times a day (TID) | ORAL | 0 refills | Status: AC

## 2022-11-30 NOTE — ED Provider Notes (Signed)
[facility] EMERGENCY DEPARTMENT Provider Note   CSN: [id number] Arrival date & time: 11/30/22  1601     History  Chief Complaint  Patient presents with   Herpes Zoster    [name] is a 56 y.o. female.  Patient presents to the emergency room complaining of a pruritic, burning rash along her upper back and left arm.  She states the rash appeared 2 days ago.  She states that her physician recently recommended that she get a shingles vaccine but she declined.  She states that over the past day the rash began to transition from itching to burning.  She denies any systemic symptoms such as nausea, vomiting, fevers, abdominal pain, shortness of breath.  Past medical history seen for asthma, hypertension, type 2 diabetes, diabetic neuropathy  HPI     Home Medications Prior to Admission medications   Medication Sig Start Date End Date Taking? Authorizing Provider  valACYclovir (VALTREX) 1000 MG tablet Take 1 tablet (1,000 mg total) by mouth 3 (three) times daily. 11/30/22  Yes Cherlynn June B, PA-C  albuterol (PROVENTIL) (2.5 MG/3ML) 0.083% nebulizer solution Take 3 mLs (2.5 mg total) by nebulization every 2 (two) hours as needed for wheezing or shortness of breath. 01/28/19   [place], Sara Chu, PA  albuterol (VENTOLIN HFA) 108 (90 Base) MCG/ACT inhaler Inhale 2 puffs into the lungs every 6 (six) hours as needed for wheezing or shortness of breath. 01/01/22   Tyler Pita, MD  amLODipine (NORVASC) 10 MG tablet Take 1 tablet (10 mg total) by mouth daily. 09/30/22   Aline August, MD  atorvastatin (LIPITOR) 10 MG tablet Take 10 mg by mouth daily.    [provider]  budesonide-formoterol (SYMBICORT) 160-4.5 MCG/ACT inhaler Inhale 2 puffs into the lungs 2 (two) times daily. 01/01/22   Tyler Pita, MD  Cholecalciferol (VITAMIN D3) 1.25 MG (50000 UT) CAPS Take 1 capsule by mouth once a week. wednesday 02/09/20   [provider]  Clobetasol Prop  Emollient Base (CLOBETASOL PROPIONATE E) 0.05 % emollient cream Apply 1 Application topically at bedtime. 11/21/22   Berton Mount, NP  diclofenac sodium (VOLTAREN) 1 % GEL Apply 2 g topically 4 (four) times daily as needed (pain). 01/28/19   Ripley Fraise, PA  gabapentin (NEURONTIN) 300 MG capsule Take 1 capsule (300 mg total) by mouth 2 (two) times daily for 15 days. 09/30/22 11/26/22  Aline August, MD  ibuprofen (ADVIL) 800 MG tablet Take 800 mg by mouth every 8 (eight) hours as needed.    [provider]  insulin glargine-yfgn (SEMGLEE) 100 UNIT/ML injection Inject 0.2 mLs (20 Units total) into the skin daily. 09/30/22   Aline August, MD  loratadine (CLARITIN) 10 MG tablet Take 1 tablet (10 mg total) by mouth daily. 01/28/19   Ripley Fraise, PA  losartan (COZAAR) 100 MG tablet Take 1 tablet (100 mg total) by mouth daily. 08/27/22   Leonie Man, MD  meclizine (ANTIVERT) 25 MG tablet Take 1 tablet (25 mg total) by mouth 3 (three) times daily as needed for dizziness. 09/30/22   Aline August, MD  metFORMIN (GLUCOPHAGE) 1000 MG tablet Take 1 tablet (1,000 mg total) by mouth 2 (two) times daily with a meal. See discharge instructions for how to take medicine at first 01/28/19   [place], Langley, PA  methocarbamol (ROBAXIN) 500 MG tablet TAKE 1 TABLET BY MOUTH EVERY 6 HOURS AS NEEDED FOR MUSCLE SPASMS 09/23/22   Newt Minion, MD  metoprolol tartrate (  LOPRESSOR) 50 MG tablet Take 2 tablets (100 mg total) by mouth 2 (two) times daily. 09/30/22   Aline August, MD  omeprazole (PRILOSEC) 40 MG capsule Take 1 capsule (40 mg total) by mouth daily. 01/01/22   Tyler Pita, MD  Semaglutide (RYBELSUS) 7 MG TABS Take by mouth daily.    [provider]  sertraline (ZOLOFT) 50 MG tablet Take 1.5 tablets (75 mg total) by mouth every morning. 01/28/19   Ripley Fraise, PA      Allergies    Patient has no known allergies.    Review of Systems   Review of Systems  Skin:  Positive for rash.     Physical Exam Updated Vital Signs BP (!) 145/121 (BP Location: Right Arm)   Pulse 73   Temp 98.4 F (36.9 C)   Resp 18   SpO2 99%  Physical Exam Vitals and nursing note reviewed.  Constitutional:      General: She is not in acute distress.    Appearance: She is well-developed.  HENT:     Head: Normocephalic and atraumatic.  Eyes:     Conjunctiva/sclera: Conjunctivae normal.  Cardiovascular:     Rate and Rhythm: Normal rate and regular rhythm.     Heart sounds: No murmur heard. Pulmonary:     Effort: Pulmonary effort is normal. No respiratory distress.     Breath sounds: Normal breath sounds.  Abdominal:     Palpations: Abdomen is soft.     Tenderness: There is no abdominal tenderness.  Musculoskeletal:        General: No swelling.     Cervical back: Neck supple.  Skin:    General: Skin is warm and dry.     Capillary Refill: Capillary refill takes less than 2 seconds.     Findings: Rash present.     Comments: Vesicular rash noted along T1 dermatome. No erythema or drainage noted  Neurological:     Mental Status: She is alert.  Psychiatric:        Mood and Affect: Mood normal.     ED Results / Procedures / Treatments   Labs (all labs ordered are listed, but only abnormal results are displayed) Labs Reviewed - No data to display  EKG None  Radiology No results found.  Procedures Procedures    Medications Ordered in ED Medications - No data to display  ED Course/ Medical Decision Making/ A&P                           Medical Decision Making  Patient presents emergency room with a chief complaint of rash.  Differential diagnosis includes but is not limited to herpes zoster,Cellulitis, contact dermatitis, and others  I reviewed the patient's past medical history and found visits for oncology due to papillary thyroid carcinoma.  No relevant visits  There is no indication at this time for labs or imaging  Patient's presentation is consistent with  herpes zoster.  Plan to discharge patient home with prescription for valacyclovir. Patient to follow up as needed with her primary care provider        Final Clinical Impression(s) / ED Diagnoses Final diagnoses:  Herpes zoster without complication    Rx / DC Orders ED Discharge Orders          Ordered    valACYclovir (VALTREX) 1000 MG tablet  3 times daily        11/30/22 1639  Ronny Bacon 11/30/22 1642    Godfrey Pick, MD 11/30/22 2115

## 2022-11-30 NOTE — ED Provider Triage Note (Signed)
Emergency Medicine Provider Triage Evaluation Note  DAREN DOSWELL , a 56 y.o. female  was evaluated in triage.  Pt complains of a pruritic burning rash which runs along the left portion of her back and left arm.  Patient noticed the rash a day or 2 ago which was initially itching.  It began to burn and intensified with its most intense feeling at 2 AM this morning.  Patient denies nausea, vomiting, shortness of breath  Review of Systems  Positive: As above Negative: As above  Physical Exam  BP (!) 145/121 (BP Location: Right Arm)   Pulse 73   Temp 98.4 F (36.9 C)   Resp 18   SpO2 99%  Gen:   Awake, no distress   Resp:  Normal effort  MSK:   Moves extremities without difficulty  Other:  Vesicular rash noted  Medical Decision Making  Medically screening exam initiated at 4:15 PM.  Appropriate orders placed.  ERIK NESSEL was informed that the remainder of the evaluation will be completed by another provider, this initial triage assessment does not replace that evaluation, and the importance of remaining in the ED until their evaluation is complete.     Dorothyann Peng, PA-C 11/30/22 1616

## 2022-11-30 NOTE — ED Triage Notes (Signed)
Patient complains of painful, burning vesicular rash on left back and left arm that she noticed a few days ago but burning pain became most intense at 0200 this morning. Patient is alert, oriented, and in no apparent distress at this time.

## 2022-11-30 NOTE — Discharge Instructions (Addendum)
You were diagnosed today with shingles.  Please take the prescribed medication.  Follow-up as needed with your primary care provider.  What is shingles? Shingles is a painful rash that is usually shaped like a band (picture 1). It can affect people of all ages, but it is most common in those older than 51. It is also more common in people whose immune system (the body's infection-fighting system) is weaker than normal. Another name for shingles is "herpes zoster." Shingles is caused by a virus called varicella-zoster virus. This is the same virus that causes chickenpox. After someone has chickenpox, the virus sometimes hides out, "asleep" in the body. Years later, it can "wake up" and cause shingles. The first time a person is infected with that virus, they get chickenpox, not shingles. Is shingles contagious? In a way, yes. It is not possible to "catch" shingles from someone who has the rash. But if you have never had chickenpox or gotten the chickenpox vaccine, it is possible to "catch" the virus and then get sick with chickenpox. Shingles and chickenpox are caused by the same virus. You probably will not catch the virus (or get chickenpox) if you: ?Had chickenpox or shingles in the past ?Had the chickenpox vaccine ?Were born in the [place] before 1980 (most people born before [birth year] have had chickenpox even if they don't remember it) If you have never had chickenpox or the chickenpox vaccine, avoid contact with anyone who has shingles. It is especially important that you do not touch their rash. If you do, you could get sick with chickenpox. In rare cases, it is possible to get chickenpox from just being near someone with shingles. Some people have a higher risk than others for getting very sick or having other problems because of chickenpox. People at highest risk include: ?People who are pregnant - Pregnant people can pass the chickenpox virus to their growing baby. ?Premature babies ?People whose  immune system (the body's infection-fighting system) is weaker than normal What are the symptoms of shingles? At first, shingles causes weird sensations on your skin. You might feel itching, burning, pain, or tingling. Some people get a fever, feel sick, or get a headache. Within 1 to 2 days, a rash with blisters appears. Blisters most often appear in a band across the chest and back (picture 1 and picture 2). But they can show up on other parts of the body, too. The blisters cause pain that can be mild or severe. Within 3 to 4 days, shingles blisters can become open sores or "ulcers." These ulcers can sometimes get infected. Within 7 to 10 days, the rash should scab over and start to heal. Can shingles be serious? Yes. Shingles can be serious, but this is rare. About 1 out of 10 people with shingles will get something called "postherpetic neuralgia" ("PHN"). People with PHN keep feeling pain or discomfort even after their rash goes away. This pain can last for months or even years. It can be so bad that it makes it hard to sleep, causes weight loss, and leads to depression. Shingles can also cause: ?Skin infections ?Eye problems (if the rash is near the eye) ?Ear problems (if the rash is near the ear) In rare cases, shingles can cause serious problems with the brain or nerves. But this is very uncommon. Will I need tests? It depends. Your doctor or nurse will probably be able to tell if you have shingles by doing an exam and asking about your symptoms. In some  cases, they might take a sample of fluid from your rash for testing. If you have a rash that you think might be shingles, call your doctor or nurse right away. They will do an exam and might recommend treatment. How is shingles treated? It depends on how long you have had the shingles rash: ?If you have had the rash for less than 3 days, your doctor will prescribe a medicine to help you get rid of the virus. These medicines are called  "antivirals." They can speed your recovery and lower the chances of problems such as PHN. ?If you have had the rash for more than 3 days, your doctor might or might not prescribe medicine. Antiviral medicine might help if new blisters are still appearing, or if your immune system is weaker than normal. Many people can use non-prescription pain medicines to treat their pain. But some people need prescription medicines. Is there anything I can do on my own to feel better? Yes. You can: ?Take all of your medicines as instructed. ?Keep your rash clean and dry. Do not use creams or gels unless your doctor or nurse says that you should. ?Try not to scratch your skin. It might help to cover it with a clean dressing. ?Wear loose clothing if this makes you more comfortable. Can shingles be prevented? People can lower their chances of getting shingles by getting the shingles vaccine. The vaccine might also make shingles symptoms milder if they do occur. The shingles vaccine is typically recommended for adults over 50 years. It might also be recommended for younger adults, if their immune system is weaker than normal. Your doctor can tell you if you should get a shingles vaccine. If you do get shingles, you can prevent spreading the virus to other people if you: ?Keep your rash covered. ?Wash your hands often until your rash has scabbed over. When should I call the doctor? Call your doctor or nurse right away if you think that you might have shingles. The sooner you can start treatment, the better. If you are already being treated for shingles, call your doctor or nurse if: ?Your pain gets worse and is not helped by over-the-counter medicines. ?You have increased redness or swelling around your rash. ?You get a fever. ?You have eye symptoms like redness, irritation, or vision problems. ?You have ear symptoms like pain or trouble hearing.

## 2022-12-01 ENCOUNTER — Other Ambulatory Visit: Payer: Self-pay

## 2022-12-01 DIAGNOSIS — C73 Malignant neoplasm of thyroid gland: Secondary | ICD-10-CM

## 2022-12-02 ENCOUNTER — Inpatient Hospital Stay: Payer: Medicare Other

## 2022-12-02 DIAGNOSIS — C73 Malignant neoplasm of thyroid gland: Secondary | ICD-10-CM | POA: Diagnosis not present

## 2022-12-03 LAB — TSH: TSH: 76.009 u[IU]/mL — ABNORMAL HIGH (ref 0.350–4.500)

## 2022-12-04 ENCOUNTER — Encounter: Payer: Medicare Other | Admitting: Physical Therapy

## 2022-12-05 ENCOUNTER — Encounter: Payer: Self-pay | Admitting: Internal Medicine

## 2022-12-05 ENCOUNTER — Encounter
Admission: RE | Admit: 2022-12-05 | Discharge: 2022-12-05 | Disposition: A | Discharge status: Home / Self Care | Payer: Medicare Other | Source: Ambulatory Visit | Attending: Oncology | Admitting: Oncology

## 2022-12-05 DIAGNOSIS — C73 Malignant neoplasm of thyroid gland: Secondary | ICD-10-CM | POA: Diagnosis present

## 2022-12-05 MED ORDER — SODIUM IODIDE I 131 CAPSULE: 60.5700 | Freq: Once | INTRAVENOUS | Status: DC

## 2022-12-05 MED ORDER — SODIUM IODIDE I 131 CAPSULE
60.5700 | Freq: Once | INTRAVENOUS | Status: AC
  Administered 2022-12-05: 60.57 via ORAL

## 2022-12-11 ENCOUNTER — Encounter: Payer: Medicare Other | Admitting: Physical Therapy

## 2022-12-12 ENCOUNTER — Encounter
Admission: RE | Admit: 2022-12-12 | Discharge: 2022-12-12 | Disposition: A | Discharge status: Home / Self Care | Payer: Medicare Other | Source: Ambulatory Visit | Attending: Oncology | Admitting: Oncology

## 2022-12-12 DIAGNOSIS — C73 Malignant neoplasm of thyroid gland: Secondary | ICD-10-CM | POA: Insufficient documentation

## 2022-12-23 ENCOUNTER — Inpatient Hospital Stay: Payer: 59 | Admitting: Oncology

## 2022-12-24 ENCOUNTER — Inpatient Hospital Stay: Payer: 59 | Attending: Oncology | Admitting: Oncology

## 2022-12-24 ENCOUNTER — Encounter: Payer: Self-pay | Admitting: Oncology

## 2022-12-24 VITALS — BP 151/110 | HR 78 | Temp 96.0°F | Resp 17 | Wt 152.0 lb

## 2022-12-24 DIAGNOSIS — Z79899 Other long term (current) drug therapy: Secondary | ICD-10-CM | POA: Diagnosis not present

## 2022-12-24 DIAGNOSIS — C73 Malignant neoplasm of thyroid gland: Secondary | ICD-10-CM | POA: Insufficient documentation

## 2022-12-24 NOTE — Progress Notes (Signed)
Patient here for oncology follow-up appointment, expresses complaints of clogged ear & palpations. Recent thyroidectomy

## 2022-12-24 NOTE — Progress Notes (Signed)
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  FNA shows Beth Seda criteria 5 suspicious for malignancy.  While the features are suspicious for papillary thyroid carcinoma interpretation is limited by low cellularity.  Patient also had a CT chest abdomen and pelvis with contrast in July 2023 which otherwise did not show any evidence of malignancy 2.8 cm left adrenal mass stable from prior exams and no dedicated follow-up recommended    Patient underwent total thyroidectomy by Dr. Daisy Blossom.  Final pathology showed multifocal disease papillary carcinoma encapsulated classic subtype.  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  There were 2 distinct specimens 1 which was 1.3 cm with a mitotic rate of less than 3 per 2 mm.  No evidence of tumor necrosis angiolymphatic invasion or lymphovascular invasion.  Extrathyroidal extension not identified.  Margins negative.  The second tumor measured 1.9 cm and was a widely invasive encapsulated follicular variant of papillary thyroid carcinoma with favorable features.  Regional lymph nodes not sampled.  Prior to surgery patient underwent CT soft tissue neck which did not show any  evidence of lymph node involvement   Patient underwent radioactive iodine treatment by interventional radiology on 12/05/2022   Interval history-patient reports having occasional palpitations.  Denies other complaints at this time  ECOG PS- 1 Pain scale- 0 Opioid associated constipation- no  Review of systems- Review of Systems  Constitutional:  Negative for chills, fever, malaise/fatigue and weight loss.  HENT:  Negative for congestion, ear discharge and nosebleeds.   Eyes:  Negative for blurred vision.  Respiratory:  Negative for cough, hemoptysis, sputum production, shortness of breath and wheezing.   Cardiovascular:  Positive for palpitations. Negative for chest pain, orthopnea and claudication.  Gastrointestinal:  Negative for abdominal pain, blood in stool, constipation, diarrhea, heartburn, melena, nausea and vomiting.  Genitourinary:  Negative for dysuria, flank pain, frequency, hematuria and urgency.  Musculoskeletal:  Negative for back pain, joint pain and myalgias.  Skin:  Negative for rash.  Neurological:  Negative for dizziness, tingling, focal weakness, seizures, weakness and headaches.  Endo/Heme/Allergies:  Does not bruise/bleed easily.  Psychiatric/Behavioral:  Negative for depression and suicidal ideas. The patient does not have insomnia.       No Known Allergies   Past Medical History:  Diagnosis Date   Acute diverticulitis    Acute metabolic encephalopathy    Acute pain of right knee 10/14/2016   Acute renal failure (ARF) ([place]) 01/26/2019   Anemia    Arthritis    Chest pain    a. 12/2021 Cor CTA: LM nl, LAD <25%, LCX nl, RCA nl. Cor Ca2+ = 1.29.   Chronic back pain    Complicated by neuropathy. ->  On Neurontin and duloxetine along with Flexeril and Voltaren gel.  Also uses PRN tramadol.   Colon polyps  COPD with asthma    On combination of albuterol Qvar and Symbicort   Depression    Diabetes mellitus without complication ([place])    type 2    Diabetic neuropathy (HCC)    Diastolic dysfunction    a. 09/2015 Echo: EF 55-60%, GrI DD; b. 12/2021 Echo: EF 60-65%, no rwma, GrI DD, nl RV fxn. RVSP 28.75mHg. Mild MR.   Diverticulitis large intestine 05/12/2020   Dyspnea    GERD (gastroesophageal reflux disease)    Heart murmur    Hyperlipidemia    Hypertension    Lipoma of left upper extremity    Palpitations    a. 12/2021 Zio: Predominantly sinus rhythm at 80 (38-136).  Frequent PACs (5.3%).  5 short runs of atrial tachycardia (longest 33 beats at 119 bpm.  Fastest 160 bpm).  Triggered events associated PACs and short runs of PAT.   Thyroid cancer ([facility]    Urinary incontinence    Pessary present     Past Surgical History:  Procedure Laterality Date   7-Day Zio Patch Monitor  12/2021   Predominant sinus rhythm.  Rate range 38-136 bpm with an average of 80 bpm (bradycardia only noted during hours of sleep.  5 Atrial Runs-fastest was 5 beats at a max rate 162 bpm, longest was 17.2 seconds/33 beats at a rate of 119 bpm--not noted on patient trigger.  Frequent PACs noted (5.3%).  Rare PVCs.   ABDOMINAL HYSTERECTOMY     COLONOSCOPY WITH PROPOFOL N/A 10/02/2021   Procedure: COLONOSCOPY WITH PROPOFOL;  Surgeon: VLin Landsman MD;  [facility];  Service: Gastroenterology;  Laterality: N/A;   Coronary CT angiogram  01/03/2022   Thoracic aortic atherosclerosis.  Coronary Calcium Score 1.29.  Minimal proximal LAD calcification. (<25%).   ESOPHAGOGASTRODUODENOSCOPY N/A 10/02/2021   Procedure: ESOPHAGOGASTRODUODENOSCOPY (EGD);  Surgeon: VLin Landsman MD;  [facility];  Service: Gastroenterology;  Laterality: N/A;   LIPOMA EXCISION Left 07/08/2022   upper arm   THYROIDECTOMY Bilateral 11/07/2022   Procedure: TOTAL THYROIDECTOMY;  Surgeon: JMargaretha Sheffield MD;  Location: [facility] ORS;  Service: ENT;  Laterality: Bilateral;   TRANSTHORACIC ECHOCARDIOGRAM  09/29/2015   EF 55-60%.  No R WMA.  GR 1 DD.  Is also  normal valves.  Normal study.   TRANSTHORACIC ECHOCARDIOGRAM  01/09/2022   EF 60 to 65%.  Mild to moderate LVH.  G1 DD.  Normal RV size and function.  Normal atrial sizes.  Normal valves.  NORMAL ECHO   TUBAL LIGATION      Social History   Socioeconomic History   Marital status: Single    Spouse name: Not on file   Number of children: 3   Years of education: Not on file   Highest education level: Not on file  Occupational History   Not on file  Tobacco Use   Smoking status: Former    Packs/day: 3.00    Years: 10.00    Total pack years: 30.00    Types: Cigarettes    Quit date: 12/22/2004    Years since quitting: 18.0   Smokeless tobacco: Never  Vaping Use   Vaping Use: Never used  Substance and Sexual Activity   Alcohol use: No   Drug use: No   Sexual activity: Not Currently  Other Topics Concern   Not on file  Social History Narrative   [place]    [place] [employer]-  neurology    Lives alone   Social Determinants of Health   Financial Resource Strain: Not on file  Food Insecurity: No Food Insecurity (11/07/2022)   Hunger Vital Sign    Worried About Running Out of Food in the Last Year: Never true    Ran Out of Food in the Last Year: Never true  Transportation Needs: No Transportation Needs (11/07/2022)   PRAPARE - Hydrologist (Medical): No    Lack of Transportation (Non-Medical): No  Physical Activity: Not on file  Stress: Not on file  Social Connections: Not on file  Intimate Partner Violence: Not At Risk (11/07/2022)   Humiliation, Afraid, Rape, and Kick questionnaire    Fear of Current or Ex-Partner: No    Emotionally Abused: No    Physically Abused: No    Sexually Abused: No    Family History  Problem Relation Age of Onset   Hypertension Mother    Other Other      Current Outpatient Medications:    albuterol (PROVENTIL) (2.5 MG/3ML) 0.083% nebulizer  solution, Take 3 mLs (2.5 mg total) by nebulization every 2 (two) hours as needed for wheezing or shortness of breath., Disp: 75 mL, Rfl: 0   albuterol (VENTOLIN HFA) 108 (90 Base) MCG/ACT inhaler, Inhale 2 puffs into the lungs every 6 (six) hours as needed for wheezing or shortness of breath., Disp: 8 g, Rfl: 2   amLODipine (NORVASC) 10 MG tablet, Take 1 tablet (10 mg total) by mouth daily., Disp: 30 tablet, Rfl: 0   atorvastatin (LIPITOR) 10 MG tablet, Take 10 mg by mouth daily., Disp: , Rfl:    budesonide-formoterol (SYMBICORT) 160-4.5 MCG/ACT inhaler, Inhale 2 puffs into the lungs 2 (two) times daily., Disp: 1 each, Rfl: 12   Cholecalciferol (VITAMIN D3) 1.25 MG (50000 UT) CAPS, Take 1 capsule by mouth once a week. wednesday, Disp: , Rfl:    Clobetasol Prop Emollient Base (CLOBETASOL PROPIONATE E) 0.05 % emollient cream, Apply 1 Application topically at bedtime., Disp: 30 g, Rfl: 2   diclofenac sodium (VOLTAREN) 1 % GEL, Apply 2 g topically 4 (four) times daily as needed (pain)., Disp: 1 Tube, Rfl: 0   gabapentin (NEURONTIN) 300 MG capsule, Take 1 capsule (300 mg total) by mouth 2 (two) times daily for 15 days., Disp: 30 capsule, Rfl: 0   ibuprofen (ADVIL) 800 MG tablet, Take 800 mg by mouth every 8 (eight) hours as needed., Disp: , Rfl:    insulin glargine-yfgn (SEMGLEE) 100 UNIT/ML injection, Inject 0.2 mLs (20 Units total) into the skin daily., Disp: 10 mL, Rfl: 0   loratadine (CLARITIN) 10 MG tablet, Take 1 tablet (10 mg total) by mouth daily., Disp: 30 tablet, Rfl: 0   losartan (COZAAR) 100 MG tablet, Take 1 tablet (100 mg total) by mouth daily., Disp: 90 tablet, Rfl: 3   meclizine (ANTIVERT) 25 MG tablet, Take 1 tablet (25 mg total) by mouth 3 (three) times daily as needed for dizziness., Disp: 30 tablet, Rfl: 0   metFORMIN (GLUCOPHAGE) 1000 MG tablet, Take 1 tablet (1,000 mg total) by mouth 2 (two) times daily with a meal. See discharge instructions for how to take medicine at first, Disp:  60 tablet, Rfl: 1   methocarbamol (ROBAXIN) 500 MG tablet, TAKE 1 TABLET BY MOUTH EVERY 6 HOURS AS NEEDED FOR MUSCLE SPASMS, Disp: 30 tablet, Rfl: 0   metoprolol tartrate (LOPRESSOR) 50 MG tablet, Take 2 tablets (100 mg total) by mouth 2 (two) times  daily., Disp: , Rfl:    omeprazole (PRILOSEC) 40 MG capsule, Take 1 capsule (40 mg total) by mouth daily., Disp: 30 capsule, Rfl: 2   Semaglutide (RYBELSUS) 7 MG TABS, Take by mouth daily., Disp: , Rfl:    sertraline (ZOLOFT) 50 MG tablet, Take 1.5 tablets (75 mg total) by mouth every morning., Disp: 45 tablet, Rfl: 0   valACYclovir (VALTREX) 1000 MG tablet, Take 1 tablet (1,000 mg total) by mouth 3 (three) times daily., Disp: 21 tablet, Rfl: 0  Physical exam:  Vitals:   12/24/22 1308  BP: (!) 151/110  Pulse: 78  Resp: 17  Temp: (!) 96 F (35.6 C)  Weight: 152 lb (68.9 kg)   Physical Exam HENT:     Head: Atraumatic.  Cardiovascular:     Rate and Rhythm: Normal rate and regular rhythm.     Heart sounds: Normal heart sounds.  Pulmonary:     Effort: Pulmonary effort is normal.     Breath sounds: Normal breath sounds.  Abdominal:     General: Bowel sounds are normal.     Palpations: Abdomen is soft.  Lymphadenopathy:     Comments: No palpable bilateral cervical adenopathy  Skin:    General: Skin is warm and dry.  Neurological:     Mental Status: She is alert and oriented to person, place, and time.         Latest Ref Rng & Units 11/08/2022    4:54 AM  CMP  Calcium 8.9 - 10.3 mg/dL 9.2       Latest Ref Rng & Units 09/26/2022    1:03 AM  CBC  WBC 4.0 - 10.5 K/uL 7.2   Hemoglobin 12.0 - 15.0 g/dL 11.7   Hematocrit 36.0 - 46.0 % 36.1   Platelets 150 - 400 K/uL 234     No images are attached to the encounter.  NM Whole Body I131 Scan S/P Ca Rx  Result Date: 12/12/2022 CLINICAL DATA:  Papillary thyroid cancer post thyroidectomy and postoperative radioactive iodine therapy EXAM: NUCLEAR MEDICINE I-131 POST THERAPY WHOLE  BODY SCAN TECHNIQUE: The patient received 60.57 mCi I-131 sodium iodide for the treatment of thyroid cancer within the past 10 days. The patient returns today, and whole body scanning was performed in the anterior and posterior projections. COMPARISON:  None Available. FINDINGS: Abnormal tracer uptake identified at thyroid bed, more diffusely in RIGHT lobe, less in LEFT lobe, consistent with thyroid remnant. Single focus of tracer localization at the high RIGHT cervical region, may represent asymmetric salivary activity but a high metastatic cervical node is not completely excluded. No additional sites of abnormal radio iodine accumulation are seen. Small amount of excreted tracer within bladder. IMPRESSION: Thyroid remnant. Single focus of asymmetric uptake high in the RIGHT cervical region question asymmetric physiologic salivary localization though cannot completely exclude metastatic disease to a very high cervical lymph node; consider MR assessment. Electronically Signed   By: Lavonia Dana M.D.   On: 12/12/2022 17:03   NM RAI Therapy Cancer Thyroid  Result Date: 12/05/2022 CLINICAL DATA:  Papillary thyroid carcinoma. 57 year old female with multifocal papillary thyroid carcinoma. Patient status post thyroidectomy EXAM: RADIOACTIVE IODINE THERAPY FOR THYROID CANCER PROCEDURE: The risks and benefits of radioactive iodine therapy were discussed with the patient in detail. Alternative therapies were also mentioned. Radiation safety was discussed with the patient, including how to protect the general public from exposure. There were no barriers to communication. Written consent was obtained. The patient then received a capsule containing  the radiopharmaceutical. The patient will follow-up with the referring physician. RADIOPHARMACEUTICALS:  60.57 mCi I-131 sodium iodide FINDINGS: 57 year old female with multifocal focal papillary thyroid carcinoma. Patient status post thyroidectomy 11/07/2022. TNM staging T1 B  NX. Patient presents for remnant ablation. IMPRESSION: Per oral administration of I-131 sodium iodide for remnant ablation. Electronically Signed   By: Suzy Bouchard M.D.   On: 12/05/2022 15:43     Assessment and plan- Patient is a 57 y.o. female with stage I pT1b N0 M0 papillary thyroid carcinoma s/p total thyroidectomy.  She is status post radioactive iodine treatment and here for a follow-up visit  After receiving the rate of active iodine treatment she had a whole-body I-131 scan which shows a single focus of asymmetric uptake in the right cervical region which could be physiologic versus possible metastatic disease.  There was also an abnormal tracer uptake identified at the thyroid bed consistent with thyroid remnant.  I did get in touch with Dr. Yvone Neu and have asked him to follow-up on these findings if any further intervention is needed from his side.  Patient is s/p total thyroidectomy and has not yet been started on thyroid replacement therapy.  She will be seeing Dr. Rosario Jacks next week.  I will defer any follow-up of her thyroid cancer including measurement of TSH thyroglobulin and thyroglobulin antibodies to Dr. Rosario Jacks.  She will not have any follow-up with me at this time   Visit Diagnosis 1. Papillary thyroid carcinoma ([place])      Dr. Randa Evens, MD, MPH [facility] [phone] 12/24/2022 2:48 PM

## 2023-01-01 ENCOUNTER — Encounter: Payer: Self-pay | Admitting: Physical Therapy

## 2023-01-01 ENCOUNTER — Encounter: Payer: 59 | Attending: Obstetrics and Gynecology | Admitting: Physical Therapy

## 2023-01-01 DIAGNOSIS — R1030 Lower abdominal pain, unspecified: Secondary | ICD-10-CM | POA: Diagnosis present

## 2023-01-01 DIAGNOSIS — R252 Cramp and spasm: Secondary | ICD-10-CM

## 2023-01-01 NOTE — Therapy (Signed)
OUTPATIENT PHYSICAL THERAPY TREATMENT NOTE   Patient Name: [name] MRN: [id number] DOB:Feb 28, 1966, 57 y.o., female Today's Date: 01/01/2023  PCP: [name], MD   REFERRING PROVIDER: Jaquita Folds, MD     END OF SESSION:   PT End of Session - 01/01/23 1303     Visit Number 5    Date for PT Re-Evaluation 01/08/23    Authorization Type Medicare    Authorization - Visit Number 5    Authorization - Number of Visits 10    PT Start Time 1300    PT Stop Time 1345    PT Time Calculation (min) 45 min    Activity Tolerance Patient tolerated treatment well    Behavior During Therapy [facility] for tasks assessed/performed             Past Medical History:  Diagnosis Date   Acute diverticulitis    Acute metabolic encephalopathy    Acute pain of right knee 10/14/2016   Acute renal failure (ARF) ([place]) 01/26/2019   Anemia    Arthritis    Chest pain    a. 12/2021 Cor CTA: LM nl, LAD <25%, LCX nl, RCA nl. Cor Ca2+ = 1.29.   Chronic back pain    Complicated by neuropathy. ->  On Neurontin and duloxetine along with Flexeril and Voltaren gel.  Also uses PRN tramadol.   Colon polyps    COPD with asthma    On combination of albuterol Qvar and Symbicort   Depression    Diabetes mellitus without complication ([place])    type 2   Diabetic neuropathy (HCC)    Diastolic dysfunction    a. 09/2015 Echo: EF 55-60%, GrI DD; b. 12/2021 Echo: EF 60-65%, no rwma, GrI DD, nl RV fxn. RVSP 28.20mHg. Mild MR.   Diverticulitis large intestine 05/12/2020   Dyspnea    GERD (gastroesophageal reflux disease)    Heart murmur    Hyperlipidemia    Hypertension    Lipoma of left upper extremity    Palpitations    a. 12/2021 Zio: Predominantly sinus rhythm at 80 (38-136).  Frequent PACs (5.3%).  5 short runs of atrial tachycardia (longest 33 beats at 119 bpm.  Fastest 160 bpm).  Triggered events associated PACs and short runs of PAT.   Thyroid cancer ([facility]    Urinary incontinence    Pessary  present   Past Surgical History:  Procedure Laterality Date   7-Day Zio Patch Monitor  12/2021   Predominant sinus rhythm.  Rate range 38-136 bpm with an average of 80 bpm (bradycardia only noted during hours of sleep.  5 Atrial Runs-fastest was 5 beats at a max rate 162 bpm, longest was 17.2 seconds/33 beats at a rate of 119 bpm--not noted on patient trigger.  Frequent PACs noted (5.3%).  Rare PVCs.   ABDOMINAL HYSTERECTOMY     COLONOSCOPY WITH PROPOFOL N/A 10/02/2021   Procedure: COLONOSCOPY WITH PROPOFOL;  Surgeon: VLin Landsman MD;  [facility];  Service: Gastroenterology;  Laterality: N/A;   Coronary CT angiogram  01/03/2022   Thoracic aortic atherosclerosis.  Coronary Calcium Score 1.29.  Minimal proximal LAD calcification. (<25%).   ESOPHAGOGASTRODUODENOSCOPY N/A 10/02/2021   Procedure: ESOPHAGOGASTRODUODENOSCOPY (EGD);  Surgeon: VLin Landsman MD;  [facility];  Service: Gastroenterology;  Laterality: N/A;   LIPOMA EXCISION Left 07/08/2022   upper arm   THYROIDECTOMY Bilateral 11/07/2022   Procedure: TOTAL THYROIDECTOMY;  Surgeon: JMargaretha Sheffield MD;  Location: [facility] ORS;  Service: ENT;  Laterality:  Bilateral;   TRANSTHORACIC ECHOCARDIOGRAM  09/29/2015   EF 55-60%.  No R WMA.  GR 1 DD.  Is also normal valves.  Normal study.   TRANSTHORACIC ECHOCARDIOGRAM  01/09/2022   EF 60 to 65%.  Mild to moderate LVH.  G1 DD.  Normal RV size and function.  Normal atrial sizes.  Normal valves.  NORMAL ECHO   TUBAL LIGATION     Patient Active Problem List   Diagnosis Date Noted   S/P total thyroidectomy 11/07/2022   Vertigo 09/26/2022   Left-sided weakness 09/26/2022   DM (diabetes mellitus), type 2 with complications ([place]) 26/94/8546   Papillary thyroid carcinoma ([place]) 27/02/5008   Acute metabolic encephalopathy 38/18/2993   Lumbar strain 08/13/2022   Lipoma of left upper extremity    Exercise intolerance 12/26/2021   Heart murmur 12/26/2021   DOE  (dyspnea on exertion) 12/26/2021   Palpitations 12/26/2021   Abdominal bloating    Polyp of colon    Multiple adenomatous polyps    Depression    Hyperlipidemia associated with type 2 diabetes mellitus ([place])    GERD (gastroesophageal reflux disease)    Influenza A 01/26/2019   Hypokalemia 06/12/2017   Marijuana use 06/12/2017   Diabetic neuropathy ([place]) 01/07/2016   Plantar fasciitis of left foot 11/26/2015   Back pain 10/10/2015   Asthma 09/29/2015   Essential hypertension 09/29/2015   DM (diabetes mellitus) type II controlled, neurological manifestation ([place]) 09/29/2015   REFERRING DIAG:  N32.81 (ICD-10-CM) - Overactive bladder  N39.3 (ICD-10-CM) - SUI (stress urinary incontinence, female)  Z16.967 (ICD-10-CM) - Levator spasm      THERAPY DIAG:  Cramp and spasm   Lower abdominal pain   Rationale for Evaluation and Treatment Rehabilitation   ONSET DATE: 2022   SUBJECTIVE:                                                                                                                                                                                            SUBJECTIVE STATEMENT: I am not having any pelvic pain.  The pessary is working.    PAIN:  Are you having pain? No NPRS scale: 0/10    PRECAUTIONS: None   WEIGHT BEARING RESTRICTIONS No   FALLS:  Has patient fallen in last 6 months? Yes. Number of falls 5 due to her knee giving out and not due to balance   LIVING ENVIRONMENT: Lives with: lives alone   OCCUPATION: none   PLOF: Independent   PATIENT GOALS reduce pain and prior function   PERTINENT HISTORY:  Abdominal hysterectomy; acute renal failure, diabetes, COPD   BOWEL MOVEMENT Pain with bowel  movement: No and no issues with BM   URINATION Pain with urination: None  Fully empty bladder: Yes Stream:  medium Urgency: none Frequency: every 2 hours during the day, gets up 3-4 times at night Leakage: none Pads: None   INTERCOURSE not sexually  active   PROLAPSE Stage II anterior, Stage II posterior, Stage I apical prolapse       OBJECTIVE:    DIAGNOSTIC FINDINGS:  PVR of 56 ml was obtained by bladder scan. Pelvic floor strength III/V, Pelvic floor musculature: Right levator non-tender, Right obturator tender, Left levator non-tender, Left obturator tender     COGNITION:            Overall cognitive status: Within functional limits for tasks assessed                          SENSATION:            Light touch: Appears intact            Proprioception: Appears intact   Patie              POSTURE: rounded shoulders, forward head, and flexed trunk                PELVIC ALIGNMENT:   LUMBARAROM/PROM   A/PROM A/PROM  eval 01/01/23  Flexion full full  Extension Decreased by 25% full  Right lateral flexion Decreased by 25% full  Left lateral flexion full full  Right rotation Decreased by 25% full  Left rotation full full   (Blank rows = not tested)   LOWER EXTREMITY ROM:   Passive ROM Right eval Left eval  Hip external rotation 65 45   (Blank rows = not tested)   LOWER EXTREMITY MMT:   MMT Right eval Left eval Right/left 01/01/2023  Hip flexion   4-/5 4/5  Hip extension 4/5 4/5 4/5  Hip abduction 4/5 4/5 4/5  Hip adduction 4/5 4/5 4/5     PALPATION:   General  no tenderness                External Perineal Exam no tenderness                             Internal Pelvic Floor tenderness located on the right obturator internist and levator ani   Patient confirms identification and approves PT to assess internal pelvic floor and treatment Yes   PELVIC MMT:   MMT eval  Vaginal 1/5  (Blank rows = not tested)         TONE: Increased on the right   PROLAPSE: Patient had pessary in   TODAY'S TREATMENT  01/01/2023 Exercises: Strengthening: Squats 10x with tactile cues to contract the abdomen Side lunges Dead lift Forward lunges Bilateral shoulder row with red band and squat Standing anti  rotational with red band Standing bilateral shoulder flexion with red band and core engaged Standing elbow flexion with red band Standing shoulder abduction with red band Standing serratus anterior with red band Supine bridges    11/27/2022 Manual: Soft tissue mobilization: Manual work to the suprapubic area just below the c-section scar Manual work to the ischiocavernosus bilaterally through her clothes due to recent yeast infection.  Scar tissue mobilization: Manual work to the c-section scar to increased mobility and educated patient on scar massage at home.  Myofascial release: Fascial release to the right lower quadrant to reduce  fascial restrictions Exercises: Stretches/mobility: Happy baby in sitting holding 30 sec Hip adductor stretch in standing holding 30 sec each Supine hamstring strength holding 30 sec each     11/20/2022 Manual: Soft tissue mobilization: Sit on tennis ball and massage the pelvic floor muscles.  Internal pelvic floor techniques:No emotional/communication barriers or cognitive limitation. Patient is motivated to learn. Patient understands and agrees with treatment goals and plan. PT explains patient will be examined in standing, sitting, and lying down to see how their muscles and joints work. When they are ready, they will be asked to remove their underwear so PT can examine their perineum. The patient is also given the option of providing their own chaperone as one is not provided in our facility. The patient also has the right and is explained the right to defer or refuse any part of the evaluation or treatment including the internal exam. With the patient's consent, PT will use one gloved finger to gently assess the muscles of the pelvic floor, seeing how well it contracts and relaxes and if there is muscle symmetry. After, the patient will get dressed and PT and patient will discuss exam findings and plan of care. PT and patient discuss plan of care,  schedule, attendance policy and HEP activities.  Place index finger into the vaginal canal to assess strength and see where her pessary is. The pessary is in the canal and she did not have tenderness with light touch. White discharge was covering the labia minora and the vulvar area. The area around the urethra and the vaginal canal was red and irritated. Patient reports she has been scratching due to the itching and burning feeling she is having.  Neuromuscular re-education: Down training: Diaphragmatic breathing to relax the pelvic floor Exercises: Stretches/mobility: Piriformis stretch in sitting holding for 30 sec bil.  Hamstring stretch in sitting holding for 30 sec bil.  Sitting hip adductor stretch holding 30 sec each Sitting happy baby stretch holding for 30 sec Sitting cat camel 15x 10/23/2022 Manual: Soft tissue mobilization: to the lower abdomen to work through the tenderness, and soft tissue restrictions Scar tissue mobilization:to lower abdomen to work through restrictions and educated patient on how to perform manual work to the scar.      PATIENT EDUCATION: 01/01/2023 Education details: Access Code: [email or web address] Person educated: Patient Education method: Explanation and Demonstration Education comprehension: verbalized understanding and returned demonstration       HOME EXERCISE PROGRAM: 01/01/2023 Access Code: [email or web address] URL: [email or web address] Date: 01/01/2023 Prepared by: Earlie Counts  Exercises - Squat  - 1 x daily - 3 x weekly - 1 sets - 10 reps - Side Lunge Adductor Stretch  - 1 x daily - 3 x weekly - 2 sets - 10 reps - Lunge with Counter Support  - 1 x daily - 3 x weekly - 2 sets - 5 reps - 5 sec hold - Deadlift with Resistance  - 1 x daily - 3 x weekly - 2 sets - 10 reps - Squatting Shoulder Row with Anchored Resistance  - 1 x daily - 3 x weekly - 2 sets - 10 reps - Isometric Anti-Rotation Press  - 1 x daily - 3 x weekly - 2 sets - 10 reps -  Standing Shoulder Flexion with Resistance  - 1 x daily - 3 x weekly - 1 sets - 10 reps - Standing Elbow Flexion with Resistance  - 1 x daily - 3 x weekly - 1 sets - 10 reps - Standing  Single Arm Shoulder Abduction with Resistance  - 1 x daily - 3 x weekly - 1 sets - 10 reps - Standing Serratus Punch with Resistance  - 1 x daily - 3 x weekly - 1 sets - 10 reps - Supine Bridge  - 1 x daily - 3 x weekly - 1 sets - 10 reps   ASSESSMENT:   CLINICAL IMPRESSION: Patient is a 57 y.o. female who was seen today for physical therapy treatment for overactive bladder, stress incontinence, and levator spasm. Patient came in for 1 more visit to learn her HEP and be reassessed. She is not leaking urine anymore. She is not having pelvic pain. She is not having urgency. Patient is independent with her HEP.  She is able to fully empty her bladder within 5 minutes. She is doing a walking program when she is able to with her treatments for thyroid cancer and the weather outside.    OBJECTIVE IMPAIRMENTS decreased activity tolerance, decreased coordination, decreased endurance, decreased mobility, decreased ROM, decreased strength, increased fascial restrictions, increased muscle spasms, and pain.    ACTIVITY LIMITATIONS carrying, lifting, bending, sitting, standing, squatting, sleeping, bed mobility, continence, and toileting   PARTICIPATION LIMITATIONS: cleaning, laundry, and shopping   PERSONAL FACTORS 3+ comorbidities: Abdominal hysterectomy; acute renal failure, diabetes, COPD  are also affecting patient's functional outcome.    REHAB POTENTIAL: Good   CLINICAL DECISION MAKING: Evolving/moderate complexity   EVALUATION COMPLEXITY: Moderate     GOALS: Goals reviewed with patient? Yes   SHORT TERM GOALS: Target date: 11/04/2022   Patient independent with manual work to the lower abdomen to reduce her pain.  Baseline: Goal status: Met 11/27/2022   2.  Patient independent with initial HEP for hip  stretches.  Baseline:  Goal status: Met 11/27/2022  3.  Patient understands how to engage her lower abdominals during transitional movements.  Baseline:  Goal status: Met 01/01/2023     LONG TERM GOALS: Target date: 01/08/2023    Patient is independent with advanced HEP for core engagement and pelvic floor strength Baseline:  Goal status: Met 01/01/2023   2.  Patient is able to urinate within 5 minutes of sitting on the commode due to relaxation of the pelvic floor musclels.  Baseline:  Goal status: Met 01/01/2023   3.  Patient pain level after she urinates decreased </= 1-2 due to the ability for the pelvic floor to relax.  Baseline:  Goal status: Met 01/01/2023   4.  Patient reports she leaks 2 times per week and wears 1 pad due to her pelvic floor strength >/= 3/5 holding for 5-10 seconds.  Baseline:  Goal status: Met 01/01/2023   5.  Patient is able to return to a walking program due to her lower abdominal pain decreased </= 1-2/10 and increased in hip strength.  Baseline:  Goal status: Met 01/01/2023     PLAN: PT FREQUENCY: 1 session   PT DURATION:  1 seek   PLANNED INTERVENTIONS: Therapeutic exercises, Therapeutic activity, Neuromuscular re-education, Patient/Family education, Self Care, Joint mobilization, Dry Needling, Electrical stimulation, Cryotherapy, Moist heat, Ultrasound, Biofeedback, and Manual therapy   PLAN  Today patient was assessed for discharge, given HEP and discharged to HEP.   Earlie Counts, PT 01/01/23 1:53 PM

## 2023-01-09 ENCOUNTER — Other Ambulatory Visit (HOSPITAL_COMMUNITY): Payer: Self-pay

## 2023-01-09 ENCOUNTER — Telehealth (HOSPITAL_COMMUNITY): Payer: Self-pay | Admitting: Emergency Medicine

## 2023-01-09 ENCOUNTER — Encounter (HOSPITAL_COMMUNITY): Payer: Self-pay | Admitting: Emergency Medicine

## 2023-01-09 ENCOUNTER — Emergency Department (HOSPITAL_COMMUNITY)
Admission: EM | Admit: 2023-01-09 | Discharge: 2023-01-09 | Disposition: A | Discharge status: Home / Self Care | Payer: 59 | Attending: Emergency Medicine | Admitting: Emergency Medicine

## 2023-01-09 ENCOUNTER — Other Ambulatory Visit: Payer: Self-pay

## 2023-01-09 DIAGNOSIS — Z79899 Other long term (current) drug therapy: Secondary | ICD-10-CM | POA: Insufficient documentation

## 2023-01-09 DIAGNOSIS — I10 Essential (primary) hypertension: Secondary | ICD-10-CM | POA: Insufficient documentation

## 2023-01-09 DIAGNOSIS — J449 Chronic obstructive pulmonary disease, unspecified: Secondary | ICD-10-CM | POA: Diagnosis not present

## 2023-01-09 DIAGNOSIS — E1140 Type 2 diabetes mellitus with diabetic neuropathy, unspecified: Secondary | ICD-10-CM | POA: Insufficient documentation

## 2023-01-09 DIAGNOSIS — R739 Hyperglycemia, unspecified: Secondary | ICD-10-CM

## 2023-01-09 DIAGNOSIS — Z794 Long term (current) use of insulin: Secondary | ICD-10-CM | POA: Insufficient documentation

## 2023-01-09 DIAGNOSIS — E1165 Type 2 diabetes mellitus with hyperglycemia: Secondary | ICD-10-CM | POA: Insufficient documentation

## 2023-01-09 DIAGNOSIS — Z7984 Long term (current) use of oral hypoglycemic drugs: Secondary | ICD-10-CM | POA: Insufficient documentation

## 2023-01-09 DIAGNOSIS — R39198 Other difficulties with micturition: Secondary | ICD-10-CM

## 2023-01-09 LAB — BETA-HYDROXYBUTYRIC ACID: Beta-Hydroxybutyric Acid: 0.2 mmol/L (ref 0.05–0.27)

## 2023-01-09 LAB — URINALYSIS, ROUTINE W REFLEX MICROSCOPIC
Bacteria, UA: NONE SEEN
Bilirubin Urine: NEGATIVE
Glucose, UA: >=500 mg/dL — AB
Hgb urine dipstick: NEGATIVE
Ketones, ur: NEGATIVE mg/dL
Leukocytes,Ua: NEGATIVE
Nitrite: NEGATIVE
Protein, ur: NEGATIVE mg/dL
Specific Gravity, Urine: 1.008 (ref 1.005–1.030)
pH: 6 (ref 5.0–8.0)

## 2023-01-09 LAB — CBC WITH DIFFERENTIAL/PLATELET
Abs Immature Granulocytes: 0.02 10*3/uL (ref 0.00–0.07)
Basophils Absolute: 0 10*3/uL (ref 0.0–0.1)
Basophils Relative: 0 %
Eosinophils Absolute: 0 10*3/uL (ref 0.0–0.5)
Eosinophils Relative: 0 %
HCT: 38.4 % (ref 36.0–46.0)
Hemoglobin: 13.2 g/dL (ref 12.0–15.0)
Immature Granulocytes: 1 %
Lymphocytes Relative: 29 %
Lymphs Abs: 1.3 10*3/uL (ref 0.7–4.0)
MCH: 30.7 pg (ref 26.0–34.0)
MCHC: 34.4 g/dL (ref 30.0–36.0)
MCV: 89.3 fL (ref 80.0–100.0)
Monocytes Absolute: 0.3 10*3/uL (ref 0.1–1.0)
Monocytes Relative: 6 %
Neutro Abs: 2.7 10*3/uL (ref 1.7–7.7)
Neutrophils Relative %: 64 %
Platelets: 225 10*3/uL (ref 150–400)
RBC: 4.3 MIL/uL (ref 3.87–5.11)
RDW: 16.7 % — ABNORMAL HIGH (ref 11.5–15.5)
WBC: 4.3 10*3/uL (ref 4.0–10.5)
nRBC: 0 % (ref 0.0–0.2)

## 2023-01-09 LAB — BASIC METABOLIC PANEL
Anion gap: 13 (ref 5–15)
BUN: 9 mg/dL (ref 6–20)
CO2: 26 mmol/L (ref 22–32)
Calcium: 9.5 mg/dL (ref 8.9–10.3)
Chloride: 94 mmol/L — ABNORMAL LOW (ref 98–111)
Creatinine, Ser: 1.47 mg/dL — ABNORMAL HIGH (ref 0.44–1.00)
GFR, Estimated: 42 mL/min — ABNORMAL LOW (ref >60)
Glucose, Bld: 466 mg/dL — ABNORMAL HIGH (ref 70–99)
Potassium: 3.1 mmol/L — ABNORMAL LOW (ref 3.5–5.1)
Sodium: 133 mmol/L — ABNORMAL LOW (ref 135–145)

## 2023-01-09 LAB — BLOOD GAS, VENOUS
Acid-Base Excess: 7.4 mmol/L — ABNORMAL HIGH (ref 0.0–2.0)
Bicarbonate: 29.3 mmol/L — ABNORMAL HIGH (ref 20.0–28.0)
O2 Saturation: 97.2 %
Patient temperature: 37
pCO2, Ven: 32 mmHg — ABNORMAL LOW (ref 44–60)
pH, Ven: 7.57 — ABNORMAL HIGH (ref 7.25–7.43)
pO2, Ven: 65 mmHg — ABNORMAL HIGH (ref 32–45)

## 2023-01-09 LAB — TSH: TSH: 85.495 u[IU]/mL — ABNORMAL HIGH (ref 0.350–4.500)

## 2023-01-09 LAB — CBG MONITORING, ED
Glucose-Capillary: 470 mg/dL — ABNORMAL HIGH (ref 70–99)
Glucose-Capillary: 425 mg/dL — ABNORMAL HIGH (ref 70–99)

## 2023-01-09 LAB — PREGNANCY, URINE: Preg Test, Ur: NEGATIVE

## 2023-01-09 MED ORDER — INSULIN GLARGINE-YFGN 100 UNIT/ML ~~LOC~~ SOLN: 20.0000 [IU] | Freq: Every day | SUBCUTANEOUS | 0 refills | Status: DC

## 2023-01-09 MED ORDER — INSULIN ASPART 100 UNIT/ML IJ SOLN
10.0000 [IU] | Freq: Once | INTRAMUSCULAR | Status: AC
  Administered 2023-01-09: 10 [IU] via SUBCUTANEOUS
  Filled 2023-01-09: qty 0.1

## 2023-01-09 MED ORDER — LACTATED RINGERS IV BOLUS
1000.0000 mL | Freq: Once | INTRAVENOUS | Status: AC
  Administered 2023-01-09: 1000 mL via INTRAVENOUS

## 2023-01-09 MED ORDER — INSULIN GLARGINE-YFGN 100 UNIT/ML ~~LOC~~ SOPN: 20.0000 [IU] | PEN_INJECTOR | Freq: Every day | SUBCUTANEOUS | 2 refills | Status: DC

## 2023-01-09 MED ORDER — INSULIN GLARGINE-YFGN 100 UNIT/ML ~~LOC~~ SOPN
20.0000 [IU] | PEN_INJECTOR | Freq: Every day | SUBCUTANEOUS | 2 refills | Status: DC
  Filled 2023-01-09: qty 3, 15d supply, fill #0

## 2023-01-09 MED ORDER — INSULIN GLARGINE-YFGN 100 UNIT/ML ~~LOC~~ SOLN
20.0000 [IU] | Freq: Every day | SUBCUTANEOUS | 0 refills | Status: DC
  Filled 2023-01-09: qty 10, 50d supply, fill #0

## 2023-01-09 NOTE — Discharge Instructions (Addendum)
You are seen today in the emergency department for hyperglycemia.  Your workup today was reassuring, follow-up with your primary to trend your creatinine levels to make sure your kidney function has improved.  Pick up the insulin from the [facility], it will cost $4.50.  Your primary care doctor also wants you to start your 2 thyroid medications that were prescribed.  Avoid carbohydrates and simple sugars, follow-up with your primary early next week for reevaluation.  Return to the ED for new or concerning symptoms.    August at [facility] [place], [place] [address] Phone# [phone]

## 2023-01-09 NOTE — ED Triage Notes (Signed)
Pt from home called by MD this morning for abnormal blood work hat was drawn yesterday CBG over 500, 470 on arrival to the ED , has not had insulin in 2 months just po meds

## 2023-01-09 NOTE — ED Notes (Signed)
Pt will ring out on call bell when able to provide UA

## 2023-01-09 NOTE — Progress Notes (Signed)
Transition of Care [facility]) - Emergency Department Mini Assessment   Patient Details  Name: [name] MRN: [id number] Date of Birth: [birth year]  [facility] CM/SW Contact:    Roseanne Kaufman, RN Phone Number: 01/09/2023, 12:51 PM   Clinical Narrative: This RNCM spoke with patient at bedside, who reports she is awaiting Medicaid to approve her. Patient reports she is unable to  get her insulin due to shortage at pharmacy and she has difficulty affording her copay for her medications. Patient reports she has been out of her Semglee for 2 months and out of her Rybelsus 7 mg tables for 3 months. Patient reports she has changed pharmacies due to Medicaid SW recommendation and [facility].  This RNCM advised patient due to having medical insurance [place] can not assist with payment of insulin. This RNCM advised will research a few options to assist patient with obtaining her insulin.   TOC will continue to follow.   - 2:05pm This RNCM spoke with [place] who reports the cost of the insulin is $4.50. This RNCM spoke with patient at bedside, notified patient to get insulin from [facility].   No additional TOC needs at this time.   ED Mini Assessment: What brought you to the Emergency Department? : elevated blood glucose  Barriers to Discharge: [place] of departure: Car  Interventions which prevented an admission or readmission: Medication Review    Patient Contact and Communications        ,                 Admission diagnosis:  hyperglycemia Patient Active Problem List   Diagnosis Date Noted   S/P total thyroidectomy 11/07/2022   Vertigo 09/26/2022   Left-sided weakness 09/26/2022   DM (diabetes mellitus), type 2 with complications ([place]) 36/62/9476   Papillary thyroid carcinoma ([place]) 54/65/0354   Acute metabolic encephalopathy 65/68/1275   Lumbar strain  08/13/2022   Lipoma of left upper extremity    Exercise intolerance 12/26/2021   Heart murmur 12/26/2021   DOE (dyspnea on exertion) 12/26/2021   Palpitations 12/26/2021   Abdominal bloating    Polyp of colon    Multiple adenomatous polyps    Depression    Hyperlipidemia associated with type 2 diabetes mellitus (HCC)    GERD (gastroesophageal reflux disease)    Influenza A 01/26/2019   Hypokalemia 06/12/2017   Marijuana use 06/12/2017   Diabetic neuropathy ([place]) 01/07/2016   Plantar fasciitis of left foot 11/26/2015   Back pain 10/10/2015   Asthma 09/29/2015   Essential hypertension 09/29/2015   DM (diabetes mellitus) type II controlled, neurological manifestation ([place]) 09/29/2015   PCP:  [name], MD Pharmacy:   [place], [address] [place] [address] Phone: [phone] Fax: [phone]  [id number] [address] [place] [address]Phone: [phone]: [phone]

## 2023-01-09 NOTE — ED Provider Notes (Signed)
[place] EMERGENCY DEPARTMENT AT [facility] Provider Note   CSN: [id number] Arrival date & time: 01/09/23  4098     History  Chief Complaint  Patient presents with   Hyperglycemia    [name] is a 57 y.o. female.   Hyperglycemia    Patient with complicated medical history including type 2 diabetes insulin-dependent, hypertension, hyperlipidemia, diabetic neuropathy, COPD, papillary thyroid carcinoma status post total thyroidectomy on chemo and radiation last chemo yesterday, presents to the emergency department due to abnormal lab value.  Patient states she had labs drawn yesterday, her primary called her and informed her she was hyperglycemic and needed to come to the ED.  Patient states she has been unable to get her insulin for the last 2 months due to shortages, she is been feeling nauseated but no vomiting.  Endorses generalized abdominal tenderness, no dysuria or hematuria.  No change in bowel habits.  Home Medications Prior to Admission medications   Medication Sig Start Date End Date Taking? Authorizing Provider  albuterol (PROVENTIL) (2.5 MG/3ML) 0.083% nebulizer solution Take 3 mLs (2.5 mg total) by nebulization every 2 (two) hours as needed for wheezing or shortness of breath. 01/28/19   Lule, Sara Chu, PA  albuterol (VENTOLIN HFA) 108 (90 Base) MCG/ACT inhaler Inhale 2 puffs into the lungs every 6 (six) hours as needed for wheezing or shortness of breath. 01/01/22   Tyler Pita, MD  amLODipine (NORVASC) 10 MG tablet Take 1 tablet (10 mg total) by mouth daily. 09/30/22   Aline August, MD  atorvastatin (LIPITOR) 10 MG tablet Take 10 mg by mouth daily.    [provider]  budesonide-formoterol (SYMBICORT) 160-4.5 MCG/ACT inhaler Inhale 2 puffs into the lungs 2 (two) times daily. 01/01/22   Tyler Pita, MD  Cholecalciferol (VITAMIN D3) 1.25 MG (50000 UT) CAPS Take 1 capsule by mouth once a week. wednesday 02/09/20   [provider]   Clobetasol Prop Emollient Base (CLOBETASOL PROPIONATE E) 0.05 % emollient cream Apply 1 Application topically at bedtime. 11/21/22   Berton Mount, NP  diclofenac sodium (VOLTAREN) 1 % GEL Apply 2 g topically 4 (four) times daily as needed (pain). 01/28/19   Ripley Fraise, PA  gabapentin (NEURONTIN) 300 MG capsule Take 1 capsule (300 mg total) by mouth 2 (two) times daily for 15 days. 09/30/22 12/24/22  Aline August, MD  ibuprofen (ADVIL) 800 MG tablet Take 800 mg by mouth every 8 (eight) hours as needed.    [provider]  insulin glargine-yfgn (SEMGLEE) 100 UNIT/ML injection Inject 0.2 mLs (20 Units total) into the skin daily. 01/09/23   Sherrill Raring, PA-C  loratadine (CLARITIN) 10 MG tablet Take 1 tablet (10 mg total) by mouth daily. 01/28/19   Ripley Fraise, PA  losartan (COZAAR) 100 MG tablet Take 1 tablet (100 mg total) by mouth daily. 08/27/22   Leonie Man, MD  meclizine (ANTIVERT) 25 MG tablet Take 1 tablet (25 mg total) by mouth 3 (three) times daily as needed for dizziness. 09/30/22   Aline August, MD  metFORMIN (GLUCOPHAGE) 1000 MG tablet Take 1 tablet (1,000 mg total) by mouth 2 (two) times daily with a meal. See discharge instructions for how to take medicine at first 01/28/19   [place], [place], PA  methocarbamol (ROBAXIN) 500 MG tablet TAKE 1 TABLET BY MOUTH EVERY 6 HOURS AS NEEDED FOR MUSCLE SPASMS 09/23/22   Newt Minion, MD  metoprolol tartrate (LOPRESSOR) 50 MG tablet Take 2 tablets (100 mg  total) by mouth 2 (two) times daily. 09/30/22   Aline August, MD  omeprazole (PRILOSEC) 40 MG capsule Take 1 capsule (40 mg total) by mouth daily. 01/01/22   Tyler Pita, MD  Semaglutide (RYBELSUS) 7 MG TABS Take by mouth daily.    [provider]  sertraline (ZOLOFT) 50 MG tablet Take 1.5 tablets (75 mg total) by mouth every morning. 01/28/19   Ripley Fraise, PA  valACYclovir (VALTREX) 1000 MG tablet Take 1 tablet (1,000 mg total) by mouth 3 (three) times daily. 11/30/22    Dorothyann Peng, PA-C      Allergies    Patient has no known allergies.    Review of Systems   Review of Systems  Physical Exam Updated Vital Signs BP (!) 139/104   Pulse 75   Temp 98 F (36.7 C) (Oral)   Resp (!) 21   Ht '4\' 10"'$  (1.473 m)   Wt 68 kg   SpO2 100%   BMI 31.35 kg/m  Physical Exam Vitals and nursing note reviewed. Exam conducted with a chaperone present.  Constitutional:      Appearance: Normal appearance.  HENT:     Head: Normocephalic and atraumatic.  Eyes:     General: No scleral icterus.       Right eye: No discharge.        Left eye: No discharge.     Extraocular Movements: Extraocular movements intact.     Pupils: Pupils are equal, round, and reactive to light.  Cardiovascular:     Rate and Rhythm: Normal rate and regular rhythm.     Pulses: Normal pulses.     Heart sounds: Normal heart sounds.     No friction rub. No gallop.  Pulmonary:     Effort: Pulmonary effort is normal. No respiratory distress.     Breath sounds: Normal breath sounds.  Abdominal:     General: Abdomen is flat. Bowel sounds are normal. There is no distension.     Palpations: Abdomen is soft.     Tenderness: There is abdominal tenderness.     Comments: Abdomen is soft, suprapubic tenderness without rigidity or guarding  Skin:    General: Skin is warm and dry.     Coloration: Skin is not jaundiced.  Neurological:     Mental Status: She is alert. Mental status is at baseline.     Coordination: Coordination normal.     ED Results / Procedures / Treatments   Labs (all labs ordered are listed, but only abnormal results are displayed) Labs Reviewed  BASIC METABOLIC PANEL - Abnormal; Notable for the following components:      Result Value   Sodium 133 (*)    Potassium 3.1 (*)    Chloride 94 (*)    Glucose, Bld 466 (*)    Creatinine, Ser 1.47 (*)    GFR, Estimated 42 (*)    All other components within normal limits  CBC WITH DIFFERENTIAL/PLATELET - Abnormal; Notable  for the following components:   RDW 16.7 (*)    All other components within normal limits  BLOOD GAS, VENOUS - Abnormal; Notable for the following components:   pH, Ven 7.57 (*)    pCO2, Ven 32 (*)    pO2, Ven 65 (*)    Bicarbonate 29.3 (*)    Acid-Base Excess 7.4 (*)    All other components within normal limits  URINALYSIS, ROUTINE W REFLEX MICROSCOPIC - Abnormal; Notable for the following components:   Glucose, UA >=500 (*)  All other components within normal limits  TSH - Abnormal; Notable for the following components:   TSH 85.495 (*)    All other components within normal limits  CBG MONITORING, ED - Abnormal; Notable for the following components:   Glucose-Capillary 470 (*)    All other components within normal limits  CBG MONITORING, ED - Abnormal; Notable for the following components:   Glucose-Capillary 425 (*)    All other components within normal limits  PREGNANCY, URINE  BETA-HYDROXYBUTYRIC ACID    EKG EKG Interpretation  Date/Time:  Friday January 09 2023 10:20:30 EST Ventricular Rate:  85 PR Interval:  140 QRS Duration: 74 QT Interval:  414 QTC Calculation: 493 R Axis:   -42 Text Interpretation: Sinus rhythm Multiform ventricular premature complexes , new since last tracing Probable left atrial enlargement Left anterior fascicular block Probable left ventricular hypertrophy Anterior Q waves, possibly due to LVH Nonspecific T abnormalities, lateral leads , new since last tracing Confirmed by Dorie Rank [phone]) on 01/09/2023 10:23:56 AM  Radiology No results found.  Procedures .Critical Care  Performed by: Sherrill Raring, PA-C Authorized by: Sherrill Raring, PA-C   Critical care provider statement:    Critical care time (minutes):  30   Critical care start time:  01/09/2023 12:25 PM   Critical care end time:  01/09/2023 12:55 PM   Critical care time was exclusive of:  Teaching time   Critical care was necessary to treat or prevent imminent or life-threatening  deterioration of the following conditions:  Dehydration and metabolic crisis   Critical care was time spent personally by me on the following activities:  Development of treatment plan with patient or surrogate, discussions with consultants, evaluation of patient's response to treatment, examination of patient, ordering and review of laboratory studies, ordering and review of radiographic studies, ordering and performing treatments and interventions, pulse oximetry, re-evaluation of patient's condition and review of old charts   I assumed direction of critical care for this patient from another provider in my specialty: no     Care discussed with: admitting provider       Medications Ordered in ED Medications  lactated ringers bolus 1,000 mL (0 mLs Intravenous Stopped 01/09/23 1213)  insulin aspart (novoLOG) injection 10 Units (10 Units Subcutaneous Given 01/09/23 1123)  lactated ringers bolus 1,000 mL (0 mLs Intravenous Stopped 01/09/23 1359)    ED Course/ Medical Decision Making/ A&P Clinical Course as of 01/09/23 1501  Fri Jan 09, 2023  6644 Basic metabolic panel(!) AKI with a creatinine of 1.47, doubled since 3 months ago when last checked.  Mildly hypokalemic with a potassium of 3.1, bicarb within normal limits, no anion gap.  Hyperglycemic at 466 [HS]  1111 CBC with Differential(!) No leukocytosis or anemia [HS]  1111 Blood gas, venous(!) Patient is not acidotic [HS]  1254 Urinalysis, Routine w reflex microscopic Urine, Clean Catch(!) Glucosuria without any ketonuria or proteinuria [HS]  1254 Beta-hydroxybutyric acid wnl [HS]  1335 TSH(!) Baseline value. [HS]  0347 Patient's primary physician [name], MD called me and we discussed patient's thyroid medicine.  States she is very hypothyroid based on lab values drawn yesterday and if she is being discharged to remind her to start the 2 thyroid medications once daily today. [HS]    Clinical Course User Index [HS] Sherrill Raring,  PA-C                             Medical Decision  Making Amount and/or Complexity of Data Reviewed External Data Reviewed: labs, ECG and notes.    Details: Reviewed previous oncology notes, previous EKGs and recent lab values.  Reviewed comorbidities contributing to patient's care which are documented in the HPI. Labs: ordered. Decision-making details documented in ED Course. ECG/medicine tests: ordered and independent interpretation performed. Decision-making details documented in ED Course.    Details: Sinus rhythm, nonspecific EKG changes including anterior T wave inversions.  Questionable Q waves in anterior leads.  Risk Prescription drug management. Decision regarding hospitalization.   This is a 57 year old female presenting to the emergency department due to hyperglycemia.  Differential includes but not limited to DKA, HHS, AKI, electrolyte derangement, metabolic abnormality, dehydration, sepsis.  I ordered lactated Ringer's and 10 units of insulin for the patient.  She is on cardiac monitoring in sinus rhythm.  Patient is a very mild AKI with Cr of 1.4, there is no gross electrolyte derangement, metabolic acidosis, anion gap.  Clinically, patient is not in DKA or HHS. Not septic, no UTI.   I consulted with social work to help facilitate obtaining her insulin in the outpatient setting.  Discussed with primary care doctor as documented ED course.  I reevaluated patient multiple times as documented ED course.  Patient feels improved after 2 L lactated Ringer's and 10 units of insulin, still hyperglycemic but has been decreasing.  Do not feel patient needs any admission criteria for inpatient, we are able to get patient's insulin coverage for only $4.50 she will pick it up.  She verbalized understanding that she needs to start taking her thyroid medication today.  She is stable for discharge at this time.        Final Clinical Impression(s) / ED Diagnoses Final diagnoses:   Hyperglycemia    Rx / DC Orders ED Discharge Orders          Ordered    insulin glargine-yfgn (SEMGLEE) 100 UNIT/ML injection  Daily,   Status:  Discontinued        01/09/23 1323    insulin glargine-yfgn (SEMGLEE) 100 UNIT/ML injection  Daily,   Status:  Discontinued        01/09/23 1432    insulin glargine-yfgn (SEMGLEE) 100 UNIT/ML injection  Daily        01/09/23 1446              Sherrill Raring, PA-C 01/09/23 1501    Dorie Rank, MD 01/10/23 [phone]

## 2023-01-11 ENCOUNTER — Telehealth (HOSPITAL_COMMUNITY): Payer: Self-pay | Admitting: Student

## 2023-01-11 MED ORDER — INSULIN PEN NEEDLE 30G X 8 MM MISC: 1.0000 | 0 refills | Status: DC | PRN

## 2023-01-11 NOTE — Telephone Encounter (Signed)
Patient returns to ED requesting prescription for needles for insulin pen prescribed 01/09/2022. Prescription sent to pharmacy.

## 2023-01-12 ENCOUNTER — Other Ambulatory Visit (HOSPITAL_COMMUNITY): Payer: Self-pay

## 2023-01-12 MED ORDER — TECHLITE PEN NEEDLES 31G X 5 MM MISC
0 refills | Status: DC
  Filled 2023-01-12: qty 100, 30d supply, fill #0

## 2023-02-16 ENCOUNTER — Other Ambulatory Visit: Payer: Self-pay | Admitting: Family Medicine

## 2023-02-16 DIAGNOSIS — R2681 Unsteadiness on feet: Secondary | ICD-10-CM

## 2023-02-16 DIAGNOSIS — R519 Headache, unspecified: Secondary | ICD-10-CM

## 2023-02-16 DIAGNOSIS — H539 Unspecified visual disturbance: Secondary | ICD-10-CM

## 2023-02-22 NOTE — Progress Notes (Unsigned)
[facility]   Subjective:     Chief Complaint: No chief complaint on file.  History of Present Illness: [name] is a 57 y.o. female with {PFD symptoms:24771} who presents for a pessary check. She is using a size *** {pessary type:24772} pessary. The pessary has been working well and she has no complaints. She {ACTION; IS/IS [id number] using vaginal estrogen. She denies vaginal bleeding.  Past Medical History: Patient  has a past medical history of Acute diverticulitis, Acute metabolic encephalopathy, Acute pain of right knee (10/14/2016), Acute renal failure (ARF) ([place]) (01/26/2019), Anemia, Arthritis, Chest pain, Chronic back pain, Colon polyps, COPD with asthma, Depression, Diabetes mellitus without complication ([place]), Diabetic neuropathy ([place]), Diastolic dysfunction, Diverticulitis large intestine (05/12/2020), Dyspnea, GERD (gastroesophageal reflux disease), Heart murmur, Hyperlipidemia, Hypertension, Lipoma of left upper extremity, Palpitations, Thyroid cancer ([place]), and Urinary incontinence.   Past Surgical History: She  has a past surgical history that includes Abdominal hysterectomy; Tubal ligation; Colonoscopy with propofol (N/A, 10/02/2021); Esophagogastroduodenoscopy (N/A, 10/02/2021); transthoracic echocardiogram (09/29/2015); Coronary CT angiogram (01/03/2022); transthoracic echocardiogram (01/09/2022); 7-Day Zio Patch Monitor (12/2021); Lipoma excision (Left, 07/08/2022); and Thyroidectomy (Bilateral, 11/07/2022).   Medications: She has a current medication list which includes the following prescription(s): albuterol, albuterol, amlodipine, atorvastatin, budesonide-formoterol, vitamin d3, clobetasol prop emollient base, diclofenac sodium, gabapentin, ibuprofen, insulin glargine-yfgn, insulin glargine-yfgn, insulin pen needle, techlite pen needles, loratadine, losartan, meclizine, metformin, methocarbamol, metoprolol tartrate, omeprazole, rybelsus, sertraline,  and valacyclovir.   Allergies: Patient has No Known Allergies.   Social History: Patient  reports that she quit smoking about 18 years ago. Her smoking use included cigarettes. She has a 30.00 pack-year smoking history. She has never used smokeless tobacco. She reports that she does not drink alcohol and does not use drugs.      Objective:    Physical Exam: There were no vitals taken for this visit. Gen: No apparent distress, A&O x 3. Detailed Urogynecologic Evaluation:  Pelvic Exam: Normal external female genitalia; Bartholin's and Skene's glands normal in appearance; urethral meatus {urethra:24773}, no urethral masses or discharge. The pessary was noted to be {in place:24774}. It was removed and cleaned. Speculum exam revealed {vaginal lesions:24775} in the vagina. The pessary was replaced. It was comfortable to the patient and fit well.       No data to display          Laboratory Results: Urine dipstick shows: {ua dip:315374::"negative for all components"}.    Assessment/Plan:    Assessment: [name] is a 57 y.o. with {PFD symptoms:24771} here for a pessary check. She is doing well.  Plan: She will {pessary plan:24776}. She will continue to use {lubricant:24777}. She will follow-up in *** {days/wks/mos/yrs:310907} for a pessary check or sooner as needed.  All questions were answered.   Time Spent:

## 2023-02-23 ENCOUNTER — Encounter: Payer: Self-pay | Admitting: Obstetrics and Gynecology

## 2023-02-23 ENCOUNTER — Ambulatory Visit (INDEPENDENT_AMBULATORY_CARE_PROVIDER_SITE_OTHER): Payer: 59 | Admitting: Obstetrics and Gynecology

## 2023-02-23 VITALS — BP 148/94 | HR 61

## 2023-02-23 DIAGNOSIS — N816 Rectocele: Secondary | ICD-10-CM

## 2023-02-23 DIAGNOSIS — L900 Lichen sclerosus et atrophicus: Secondary | ICD-10-CM | POA: Diagnosis not present

## 2023-02-23 DIAGNOSIS — N393 Stress incontinence (female) (male): Secondary | ICD-10-CM | POA: Diagnosis not present

## 2023-02-23 DIAGNOSIS — N3281 Overactive bladder: Secondary | ICD-10-CM | POA: Diagnosis not present

## 2023-02-23 DIAGNOSIS — E118 Type 2 diabetes mellitus with unspecified complications: Secondary | ICD-10-CM | POA: Diagnosis not present

## 2023-02-23 DIAGNOSIS — N993 Prolapse of vaginal vault after hysterectomy: Secondary | ICD-10-CM

## 2023-02-23 DIAGNOSIS — R350 Frequency of micturition: Secondary | ICD-10-CM | POA: Diagnosis not present

## 2023-02-23 DIAGNOSIS — N8110 Cystocele, unspecified: Secondary | ICD-10-CM

## 2023-02-23 LAB — POCT URINALYSIS DIPSTICK
Bilirubin, UA: NEGATIVE
Blood, UA: NEGATIVE
Glucose, UA: POSITIVE — AB
Ketones, UA: NEGATIVE
Leukocytes, UA: NEGATIVE
Nitrite, UA: NEGATIVE
Protein, UA: POSITIVE — AB
Spec Grav, UA: 1.02 (ref 1.010–1.025)
Urobilinogen, UA: 0.2 E.U./dL
pH, UA: 5.5 (ref 5.0–8.0)

## 2023-02-23 NOTE — Patient Instructions (Addendum)
Continue working on [employer]. Remember potatoes, breads, and rice is all processed into our bodies as sugar as they are starches and this can run your blood sugar up. Watch out for hidden sugars in drinks. The regular lipton Green Tea has 25g of sugar which will still run your blood sugar up.   Your goal A1c is 6  I will be watching to see what your updated one is from your PCP and then we can discuss surgery option if it is within range.

## 2023-03-03 ENCOUNTER — Encounter: Payer: Self-pay | Admitting: Family Medicine

## 2023-03-12 ENCOUNTER — Other Ambulatory Visit: Payer: Self-pay | Admitting: Student

## 2023-03-12 DIAGNOSIS — M25512 Pain in left shoulder: Secondary | ICD-10-CM

## 2023-03-14 ENCOUNTER — Ambulatory Visit
Admission: RE | Admit: 2023-03-14 | Discharge: 2023-03-14 | Disposition: A | Discharge status: Home / Self Care | Payer: 59 | Source: Ambulatory Visit | Attending: Family Medicine | Admitting: Family Medicine

## 2023-03-14 DIAGNOSIS — H539 Unspecified visual disturbance: Secondary | ICD-10-CM

## 2023-03-14 DIAGNOSIS — R519 Headache, unspecified: Secondary | ICD-10-CM

## 2023-03-14 DIAGNOSIS — R2681 Unsteadiness on feet: Secondary | ICD-10-CM

## 2023-03-24 ENCOUNTER — Telehealth: Payer: Self-pay | Admitting: Cardiology

## 2023-03-24 NOTE — Telephone Encounter (Signed)
Please contact pt for future appointment. Pt overdue for 6 month f/u. Pt needing refills. 

## 2023-03-24 NOTE — Telephone Encounter (Signed)
Pharmacy calling to request that all meds prescribed by our office be sent to their pharmacy for refills within the next 7 days. Please advise

## 2023-03-25 ENCOUNTER — Other Ambulatory Visit: Payer: Self-pay

## 2023-03-25 ENCOUNTER — Observation Stay (HOSPITAL_COMMUNITY)
Admission: EM | Admit: 2023-03-25 | Discharge: 2023-03-27 | Disposition: A | Discharge status: Home Health | Payer: Medicare HMO | Attending: Internal Medicine | Admitting: Internal Medicine

## 2023-03-25 ENCOUNTER — Emergency Department (HOSPITAL_COMMUNITY): Payer: Medicare HMO

## 2023-03-25 DIAGNOSIS — R55 Syncope and collapse: Secondary | ICD-10-CM | POA: Diagnosis not present

## 2023-03-25 DIAGNOSIS — E876 Hypokalemia: Secondary | ICD-10-CM | POA: Diagnosis not present

## 2023-03-25 DIAGNOSIS — I10 Essential (primary) hypertension: Secondary | ICD-10-CM | POA: Diagnosis not present

## 2023-03-25 DIAGNOSIS — J9811 Atelectasis: Secondary | ICD-10-CM | POA: Diagnosis not present

## 2023-03-25 DIAGNOSIS — E785 Hyperlipidemia, unspecified: Secondary | ICD-10-CM | POA: Insufficient documentation

## 2023-03-25 DIAGNOSIS — Z7984 Long term (current) use of oral hypoglycemic drugs: Secondary | ICD-10-CM | POA: Diagnosis not present

## 2023-03-25 DIAGNOSIS — E559 Vitamin D deficiency, unspecified: Secondary | ICD-10-CM | POA: Insufficient documentation

## 2023-03-25 DIAGNOSIS — C73 Malignant neoplasm of thyroid gland: Secondary | ICD-10-CM | POA: Diagnosis not present

## 2023-03-25 DIAGNOSIS — Z79899 Other long term (current) drug therapy: Secondary | ICD-10-CM | POA: Diagnosis not present

## 2023-03-25 DIAGNOSIS — E1165 Type 2 diabetes mellitus with hyperglycemia: Secondary | ICD-10-CM | POA: Insufficient documentation

## 2023-03-25 DIAGNOSIS — R39198 Other difficulties with micturition: Secondary | ICD-10-CM

## 2023-03-25 DIAGNOSIS — R739 Hyperglycemia, unspecified: Secondary | ICD-10-CM

## 2023-03-25 DIAGNOSIS — R109 Unspecified abdominal pain: Secondary | ICD-10-CM | POA: Diagnosis not present

## 2023-03-25 DIAGNOSIS — J449 Chronic obstructive pulmonary disease, unspecified: Secondary | ICD-10-CM | POA: Insufficient documentation

## 2023-03-25 DIAGNOSIS — R0601 Orthopnea: Secondary | ICD-10-CM | POA: Diagnosis not present

## 2023-03-25 DIAGNOSIS — R531 Weakness: Secondary | ICD-10-CM | POA: Diagnosis not present

## 2023-03-25 DIAGNOSIS — E1140 Type 2 diabetes mellitus with diabetic neuropathy, unspecified: Secondary | ICD-10-CM | POA: Diagnosis not present

## 2023-03-25 DIAGNOSIS — R600 Localized edema: Secondary | ICD-10-CM | POA: Insufficient documentation

## 2023-03-25 DIAGNOSIS — E1169 Type 2 diabetes mellitus with other specified complication: Secondary | ICD-10-CM | POA: Diagnosis present

## 2023-03-25 DIAGNOSIS — Z794 Long term (current) use of insulin: Secondary | ICD-10-CM | POA: Diagnosis not present

## 2023-03-25 DIAGNOSIS — R002 Palpitations: Secondary | ICD-10-CM | POA: Diagnosis not present

## 2023-03-25 DIAGNOSIS — R944 Abnormal results of kidney function studies: Secondary | ICD-10-CM | POA: Diagnosis not present

## 2023-03-25 DIAGNOSIS — M79672 Pain in left foot: Secondary | ICD-10-CM | POA: Diagnosis present

## 2023-03-25 DIAGNOSIS — I951 Orthostatic hypotension: Secondary | ICD-10-CM

## 2023-03-25 DIAGNOSIS — E890 Postprocedural hypothyroidism: Secondary | ICD-10-CM

## 2023-03-25 DIAGNOSIS — M79671 Pain in right foot: Secondary | ICD-10-CM | POA: Diagnosis not present

## 2023-03-25 DIAGNOSIS — R5381 Other malaise: Secondary | ICD-10-CM

## 2023-03-25 LAB — URINALYSIS, ROUTINE W REFLEX MICROSCOPIC
Bacteria, UA: NONE SEEN
Bilirubin Urine: NEGATIVE
Glucose, UA: >=500 mg/dL — AB
Hgb urine dipstick: NEGATIVE
Ketones, ur: NEGATIVE mg/dL
Leukocytes,Ua: NEGATIVE
Nitrite: NEGATIVE
Protein, ur: NEGATIVE mg/dL
Specific Gravity, Urine: 1.024 (ref 1.005–1.030)
pH: 5 (ref 5.0–8.0)

## 2023-03-25 LAB — HEPATIC FUNCTION PANEL
ALT: 36 U/L (ref 0–44)
AST: 33 U/L (ref 15–41)
Albumin: 3.7 g/dL (ref 3.5–5.0)
Alkaline Phosphatase: 85 U/L (ref 38–126)
Bilirubin, Direct: 0.2 mg/dL (ref 0.0–0.2)
Indirect Bilirubin: 0.5 mg/dL (ref 0.3–0.9)
Total Bilirubin: 0.7 mg/dL (ref 0.3–1.2)
Total Protein: 6.9 g/dL (ref 6.5–8.1)

## 2023-03-25 LAB — TROPONIN I (HIGH SENSITIVITY)
Troponin I (High Sensitivity): 29 ng/L — ABNORMAL HIGH (ref <18)
Troponin I (High Sensitivity): 31 ng/L — ABNORMAL HIGH (ref <18)

## 2023-03-25 LAB — I-STAT VENOUS BLOOD GAS, ED
Acid-Base Excess: 7 mmol/L — ABNORMAL HIGH (ref 0.0–2.0)
Bicarbonate: 29.5 mmol/L — ABNORMAL HIGH (ref 20.0–28.0)
Calcium, Ion: 1.09 mmol/L — ABNORMAL LOW (ref 1.15–1.40)
HCT: 43 % (ref 36.0–46.0)
Hemoglobin: 14.6 g/dL (ref 12.0–15.0)
O2 Saturation: 87 %
Potassium: 4.1 mmol/L (ref 3.5–5.1)
Sodium: 133 mmol/L — ABNORMAL LOW (ref 135–145)
TCO2: 31 mmol/L (ref 22–32)
pCO2, Ven: 35.4 mmHg — ABNORMAL LOW (ref 44–60)
pH, Ven: 7.529 — ABNORMAL HIGH (ref 7.25–7.43)
pO2, Ven: 47 mmHg — ABNORMAL HIGH (ref 32–45)

## 2023-03-25 LAB — CBG MONITORING, ED: Glucose-Capillary: 338 mg/dL — ABNORMAL HIGH (ref 70–99)

## 2023-03-25 LAB — BASIC METABOLIC PANEL
Anion gap: 18 — ABNORMAL HIGH (ref 5–15)
BUN: 18 mg/dL (ref 6–20)
CO2: 23 mmol/L (ref 22–32)
Calcium: 9.9 mg/dL (ref 8.9–10.3)
Chloride: 90 mmol/L — ABNORMAL LOW (ref 98–111)
Creatinine, Ser: 1.55 mg/dL — ABNORMAL HIGH (ref 0.44–1.00)
GFR, Estimated: 39 mL/min — ABNORMAL LOW (ref >60)
Glucose, Bld: 584 mg/dL (ref 70–99)
Potassium: 2.7 mmol/L — CL (ref 3.5–5.1)
Sodium: 131 mmol/L — ABNORMAL LOW (ref 135–145)

## 2023-03-25 LAB — CBC
HCT: 41.4 % (ref 36.0–46.0)
Hemoglobin: 13.8 g/dL (ref 12.0–15.0)
MCH: 32.3 pg (ref 26.0–34.0)
MCHC: 33.3 g/dL (ref 30.0–36.0)
MCV: 97 fL (ref 80.0–100.0)
Platelets: 253 10*3/uL (ref 150–400)
RBC: 4.27 MIL/uL (ref 3.87–5.11)
RDW: 13.2 % (ref 11.5–15.5)
WBC: 6.2 10*3/uL (ref 4.0–10.5)
nRBC: 0 % (ref 0.0–0.2)

## 2023-03-25 MED ORDER — SODIUM CHLORIDE 0.9 % IV BOLUS
1000.0000 mL | Freq: Once | INTRAVENOUS | Status: AC
  Administered 2023-03-25: 1000 mL via INTRAVENOUS

## 2023-03-25 MED ORDER — POTASSIUM CHLORIDE 10 MEQ/100ML IV SOLN
10.0000 meq | INTRAVENOUS | Status: AC
  Administered 2023-03-25 (×2): 10 meq via INTRAVENOUS
  Filled 2023-03-25 (×2): qty 100

## 2023-03-25 NOTE — ED Provider Notes (Signed)
[facility] Provider Note   CSN: [id number] Arrival date & time: 03/25/23  1538     History  Chief Complaint  Patient presents with   Leg Swelling   Foot Pain   Bil extremity burning    JANELLY FAZEL is a 57 y.o. female.  HPI   57 year old female past medical history diabetes presents emergency department with multiple complaints.  Patient has known diabetic neuropathy, takes gabapentin.  However she states over the past couple days she has been having severe burning of her bilateral feet extending up to her shins.  This is constant, so severe that she is having a difficult time walking.  This is resulted in multiple falls over the past couple days.  Patient also endorses exertional shortness of breath and dizziness.  No active chest pain at this time, she has baseline edema of her bilateral feet without any acute change.  No recent fever or illness.  Patient also reveals that she is been having left-sided upper and lower extremity weakness ongoing for months.  Home Medications Prior to Admission medications   Medication Sig Start Date End Date Taking? Authorizing Provider  albuterol (PROVENTIL) (2.5 MG/3ML) 0.083% nebulizer solution Take 3 mLs (2.5 mg total) by nebulization every 2 (two) hours as needed for wheezing or shortness of breath. 01/28/19   Lule, Sara Chu, PA  albuterol (VENTOLIN HFA) 108 (90 Base) MCG/ACT inhaler Inhale 2 puffs into the lungs every 6 (six) hours as needed for wheezing or shortness of breath. 01/01/22   Tyler Pita, MD  amLODipine (NORVASC) 10 MG tablet Take 1 tablet (10 mg total) by mouth daily. 09/30/22   Aline August, MD  atorvastatin (LIPITOR) 10 MG tablet Take 10 mg by mouth daily.    [provider]  budesonide-formoterol (SYMBICORT) 160-4.5 MCG/ACT inhaler Inhale 2 puffs into the lungs 2 (two) times daily. 01/01/22   Tyler Pita, MD  Cholecalciferol (VITAMIN D3) 1.25 MG (50000 UT) CAPS  Take 1 capsule by mouth once a week. wednesday 02/09/20   [provider]  Clobetasol Prop Emollient Base (CLOBETASOL PROPIONATE E) 0.05 % emollient cream Apply 1 Application topically at bedtime. 11/21/22   Berton Mount, NP  diclofenac sodium (VOLTAREN) 1 % GEL Apply 2 g topically 4 (four) times daily as needed (pain). 01/28/19   Ripley Fraise, PA  gabapentin (NEURONTIN) 300 MG capsule Take 1 capsule (300 mg total) by mouth 2 (two) times daily for 15 days. 09/30/22 12/24/22  Aline August, MD  ibuprofen (ADVIL) 800 MG tablet Take 800 mg by mouth every 8 (eight) hours as needed.    [provider]  Insulin Degludec FlexTouch 100 UNIT/ML SOPN SMARTSIG:10 Unit(s) Topical Daily 01/28/23   [provider]  insulin glargine-yfgn (SEMGLEE) 100 UNIT/ML Pen Inject 20 Units into the skin daily. Patient not taking: Reported on 02/23/2023 01/09/23   Prosperi, Christian H, PA-C  insulin glargine-yfgn (SEMGLEE) 100 UNIT/ML Pen Inject 20 Units into the skin daily. Patient not taking: Reported on 02/23/2023 01/09/23   Prosperi, Christian H, PA-C  Insulin Pen Needle (NOVOFINE) 30G X 8 MM MISC Inject 10 each into the skin as needed. 01/11/23   Suzy Bouchard, PA-C  Insulin Pen Needle (TECHLITE PEN NEEDLES) 31G X 5 MM MISC Use with Semglee daily as directed 01/09/23   Prosperi, Christian H, PA-C  JARDIANCE 10 MG TABS tablet Take 10 mg by mouth daily. 01/23/23   [provider]  levothyroxine (SYNTHROID) 125  MCG tablet Take 125 mcg by mouth every morning. 02/05/23   [provider]  loratadine (CLARITIN) 10 MG tablet Take 1 tablet (10 mg total) by mouth daily. 01/28/19   Ripley Fraise, PA  losartan (COZAAR) 100 MG tablet Take 1 tablet (100 mg total) by mouth daily. 08/27/22   Leonie Man, MD  meclizine (ANTIVERT) 25 MG tablet Take 1 tablet (25 mg total) by mouth 3 (three) times daily as needed for dizziness. 09/30/22   Aline August, MD  metFORMIN (GLUCOPHAGE) 1000 MG tablet Take 1  tablet (1,000 mg total) by mouth 2 (two) times daily with a meal. See discharge instructions for how to take medicine at first 01/28/19   [place], [place], PA  methocarbamol (ROBAXIN) 500 MG tablet TAKE 1 TABLET BY MOUTH EVERY 6 HOURS AS NEEDED FOR MUSCLE SPASMS 09/23/22   Newt Minion, MD  metoprolol succinate (TOPROL-XL) 50 MG 24 hr tablet Take 50 mg by mouth daily. 01/23/23   [provider]  omeprazole (PRILOSEC) 40 MG capsule Take 1 capsule (40 mg total) by mouth daily. 01/01/22   Tyler Pita, MD  Semaglutide (RYBELSUS) 7 MG TABS Take by mouth daily.    [provider]  sertraline (ZOLOFT) 50 MG tablet Take 1.5 tablets (75 mg total) by mouth every morning. 01/28/19   Ripley Fraise, PA  valACYclovir (VALTREX) 1000 MG tablet Take 1 tablet (1,000 mg total) by mouth 3 (three) times daily. 11/30/22   Dorothyann Peng, PA-C      Allergies    Patient has no known allergies.    Review of Systems   Review of Systems  Constitutional:  Positive for fatigue. Negative for fever.  Respiratory:  Positive for shortness of breath.   Cardiovascular:  Positive for leg swelling. Negative for chest pain.  Gastrointestinal:  Negative for abdominal pain, diarrhea and vomiting.  Genitourinary:  Negative for dysuria.  Musculoskeletal:        BLE burning pain  Skin:  Negative for rash.  Neurological:  Positive for dizziness, weakness and light-headedness. Negative for headaches.    Physical Exam Updated Vital Signs BP (!) 144/94   Pulse 78   Temp 98 F (36.7 C) (Oral)   Resp 16   SpO2 99%  Physical Exam Vitals and nursing note reviewed.  Constitutional:      General: She is not in acute distress.    Appearance: Normal appearance.  HENT:     Head: Normocephalic.     Mouth/Throat:     Mouth: Mucous membranes are moist.  Cardiovascular:     Rate and Rhythm: Normal rate.  Pulmonary:     Effort: Pulmonary effort is normal. No respiratory distress.  Abdominal:     Palpations: Abdomen  is soft.     Tenderness: There is no abdominal tenderness.  Musculoskeletal:     Comments: Mild bilateral edema of the dorsal aspect of the feet, vascularly intact, tenderness to even the lightest touch of the lower extremities below the knees, no overlying skin changes, no cyanosis  Skin:    General: Skin is warm.  Neurological:     Mental Status: She is alert and oriented to person, place, and time. Mental status is at baseline.  Psychiatric:        Mood and Affect: Mood normal.     ED Results / Procedures / Treatments   Labs (all labs ordered are listed, but only abnormal results are displayed) Labs Reviewed  BASIC METABOLIC PANEL - Abnormal; Notable  for the following components:      Result Value   Sodium 131 (*)    Potassium 2.7 (*)    Chloride 90 (*)    Glucose, Bld 584 (*)    Creatinine, Ser 1.55 (*)    GFR, Estimated 39 (*)    Anion gap 18 (*)    All other components within normal limits  I-STAT VENOUS BLOOD GAS, ED - Abnormal; Notable for the following components:   pH, Ven 7.529 (*)    pCO2, Ven 35.4 (*)    pO2, Ven 47 (*)    Bicarbonate 29.5 (*)    Acid-Base Excess 7.0 (*)    Sodium 133 (*)    Calcium, Ion 1.09 (*)    All other components within normal limits  TROPONIN I (HIGH SENSITIVITY) - Abnormal; Notable for the following components:   Troponin I (High Sensitivity) 29 (*)    All other components within normal limits  CBC  URINALYSIS, ROUTINE W REFLEX MICROSCOPIC  HEPATIC FUNCTION PANEL  TROPONIN I (HIGH SENSITIVITY)    EKG EKG Interpretation  Date/Time:  Wednesday March 25 2023 19:15:39 EDT Ventricular Rate:  77 PR Interval:  145 QRS Duration: 85 QT Interval:  453 QTC Calculation: 513 R Axis:   -31 Text Interpretation: Sinus rhythm Abnormal R-wave progression, late transition Probable left ventricular hypertrophy Nonspecific T abnormalities, lateral leads Prolonged QT interval Similar to previous Confirmed by Lavenia Atlas [phone]) on 03/25/2023  8:01:58 PM  Radiology DG Chest Port 1 View  Result Date: 03/25/2023 CLINICAL DATA:  Weakness. EXAM: PORTABLE CHEST 1 VIEW COMPARISON:  Chest radiograph dated 09/25/2022. FINDINGS: Minimal bibasilar atelectasis. No focal consolidation, pleural effusion, or pneumothorax. The cardiac silhouette is within limits. The aorta is tortuous. No acute osseous pathology. IMPRESSION: No active disease. Electronically Signed   By: Anner Crete M.D.   On: 03/25/2023 19:39    Procedures Procedures    Medications Ordered in ED Medications  potassium chloride 10 mEq in 100 mL IVPB (10 mEq Intravenous New Bag/Given 03/25/23 2118)    ED Course/ Medical Decision Making/ A&P                             Medical Decision Making Amount and/or Complexity of Data Reviewed Labs: ordered. Radiology: ordered.  Risk Prescription drug management. Decision regarding hospitalization.   57 year old female presents emergency department multiple complaints.  1 of which is worsening of burning sensation in the lower extremities.  She has known diabetic neuropathy, currently not controlled with gabapentin.  She has had significant debility secondary to this, difficulty walking, multiple falls.  Vital signs are stable on arrival.  Blood work shows hyperglycemia, hypokalemia down to 2.7, pseudohyponatremia as well as AKI.  Slight increase in the anion gap but no other findings of DKA, VBG shows that the patient is alkalotic.  Patient needs multiple corrections.  However attempting to replete her potassium, hydrate and then treat her hyperglycemia with insulin would require multiple hours of treatment and electrolyte rechecks.  I believe this would be better suitable as an inpatient standpoint.  Patients evaluation and results requires admission for further treatment and care.  Spoke with hospitalist, reviewed patient's ED course and they accept admission.  Patient agrees with admission plan, offers no new complaints and  is stable/unchanged at time of admit.  As an aside, in regards to the left-sided weakness, this has been ongoing for multiple months.  She just had an MRI  recently that showed no findings of CVA.  I have low suspicion for acute neurologic deficit/CVA.        Final Clinical Impression(s) / ED Diagnoses Final diagnoses:  None    Rx / DC Orders ED Discharge Orders     None         Lorelle Gibbs, DO 03/25/23 2303

## 2023-03-25 NOTE — Hospital Course (Addendum)
ED findings: -BMP significant for hyponatremia of 131, hypochloremia at 90, hypokalemia of 2.7, glucose elevated at 584, creatinine elevated at 1.55, GFR 39, anion gap 18 -VBG significant for pH 7.5, pCO2 35, bicarb 29 -CBC unremarkable -Initial troponin elevated at 29 -Pending repeat troponin -Pending UA -Pending hepatic function panel -CXR negative for acute cardiopulmonary disease -EKG significant for abnormal R wave progression, LVH, prolonged QT, T wave inversion in lead 2/3/aVF  ED interventions:  -IV potassium 60 mEq  Recent Discharge: -neuropathy, difficulty walking, hyperglycemic, K 2.7,  -Echo from 12/2021 w/ EF 60-65%, LVH, and G1DD   PCP: [name], MD -Dr. Janese Banks (oncology) -Dr. Kathyrn Sheriff (ENT) -Dr. Johny Chess (Cardiologist, will see on 5/2)  PMH: Diverticulitis, anemia, arthritis, chronic back pain, COPD, depression, type 2 diabetes with diabetic neuropathy, diastolic dysfunction, GERD, hyperlipidemia, hypertension, thyroid cancer s/p thyroidectomy, urinary incontinence -CKD,   (+) SX: recurrent falls recently, was at a restaurant today (felt dizzy while standing up to get salt/pepper, palpitations, chest pain before (left-side, radiated to back, lost consciousness, fell to the ground, landed on her butt, denies hitting her head, no dizziness prior to this), became dizzy while driving to the hospital, burning pain of lower extremities (difficulty walking due to pain), lower extremity swelling (3 days), falls at home (due to lost balance), chronic LUE numbness from the fingers up to the shoulder, (similar to nephropathy, worsened over the last few weeks), LUE weakness since fall a few weeks ago (fell on a brick, plan to get MRI shoulder soon), abdominal swelling (first noticed today, last BMP 2 days ago, usually goes daily), palpitations (daily), orthopnea (months), urinary frequency (3-4 weeks), polydipsia, intermittent headache (whole head, intermittent, lays down, dark room,  left eye vision goes black, returns shortly after, resolves spontaneously) -*When was LKN* -Heart monitor for irregular heartbeat (4/1)  (-) SX: nausea, SOB, fever, chills, cough, ST, nausea, vomiting, diarrhea, abdominal pain, constipation, SOB,  dysuria  Surgical Hx: Tubal ligation, hysterectomy, lipoma excision L arm, thyroidectomy,   Social Hx: by herself, on disability ([employer] 2023), independent of ADLs, cane/walker/wheelchair, no ETOH/tobacco/recreational drugs -*Live with, work, ADLs, ambulates w/, ETOH, tobacco, recreational drugs*  Family Hx: denies -*MI, stroke, DM, cancer, autoimmune*  Medications: *taking regularly, took today* -Albuterol nebulizer -Albuterol inhaler -Symbicort inhaler -Amlodipine 10 mg daily -Metoprolol 50 mg daily -Atorvastatin 10 mg daily -Vitamin D3 -Voltaren gel -Gabapentin 300 mg BID -Ibuprofen 800 mg TID -insulin degludec 20 units per day -metformin 1000 mg BID -Jardiance 10 mg daily -Semaglutide 7 mg daily -Levothyroxine 125 mcg -Loratadine 10 mg daily -Losartan 100 mg daily -Meclizine 25 mg daily -Methocarbamol 500 mg q6 hours -Omeprazole 40 mg  -Sertraline 50 mg -Valacyclovir 1000 mg (completed)  Allergies: none  Code: full code  Physical Exam:  (+): ptosis, cataracts R eye (-):  Plan: KUB, bedside US (no significant fluid accumulation),   04/04: Patient states that she was in the parking lot getting out of the car, and a person gave her a ride to the ED.  At restaurant, she stood up to get salt and pepper and fell over. Never happened before. She sates initially had swelling on her toes and she notes all of her symptoms happen on her left side.Drove barely. Got to parking lot, everything goes black. This dizziness was different, she felt chest pains. Dr. Thamas Jaegers. She states she is still feeling dizzy and the room is spinning and sees two of Dr. Stann Mainland. She thinks her dizziness is still present right now and feels like  her  vertigo. She states she is still having pins and needles type feeling in her left arm. She states that it hurts her to lay straight flat. She states that she has to lay on the side. She uses 3 pillow at night. [facility]. This happened at 1130.

## 2023-03-25 NOTE — ED Triage Notes (Signed)
PT arrived via POV, c/o bil extremity burning and edema. Denies missing doses of any home Rx, recent placement of cardiac monitor (4/1). States having two falls Thurs, and falling twice yesterday (4/2). Endorses SOB and dizzines on exertion. Endorses L sided numbness and tingling. Hx of s/s seen for today.

## 2023-03-26 ENCOUNTER — Emergency Department (HOSPITAL_COMMUNITY): Payer: Medicare HMO

## 2023-03-26 ENCOUNTER — Observation Stay (HOSPITAL_BASED_OUTPATIENT_CLINIC_OR_DEPARTMENT_OTHER): Payer: Medicare HMO

## 2023-03-26 DIAGNOSIS — R0609 Other forms of dyspnea: Secondary | ICD-10-CM | POA: Diagnosis not present

## 2023-03-26 DIAGNOSIS — Z794 Long term (current) use of insulin: Secondary | ICD-10-CM

## 2023-03-26 DIAGNOSIS — R109 Unspecified abdominal pain: Secondary | ICD-10-CM | POA: Diagnosis not present

## 2023-03-26 DIAGNOSIS — R002 Palpitations: Secondary | ICD-10-CM

## 2023-03-26 DIAGNOSIS — R55 Syncope and collapse: Secondary | ICD-10-CM | POA: Diagnosis present

## 2023-03-26 DIAGNOSIS — R0601 Orthopnea: Secondary | ICD-10-CM

## 2023-03-26 DIAGNOSIS — E1165 Type 2 diabetes mellitus with hyperglycemia: Secondary | ICD-10-CM

## 2023-03-26 LAB — CBG MONITORING, ED
Glucose-Capillary: 251 mg/dL — ABNORMAL HIGH (ref 70–99)
Glucose-Capillary: 196 mg/dL — ABNORMAL HIGH (ref 70–99)
Glucose-Capillary: 235 mg/dL — ABNORMAL HIGH (ref 70–99)
Glucose-Capillary: 257 mg/dL — ABNORMAL HIGH (ref 70–99)

## 2023-03-26 LAB — BASIC METABOLIC PANEL
Anion gap: 12 (ref 5–15)
BUN: 18 mg/dL (ref 6–20)
CO2: 20 mmol/L — ABNORMAL LOW (ref 22–32)
Calcium: 9.1 mg/dL (ref 8.9–10.3)
Chloride: 102 mmol/L (ref 98–111)
Creatinine, Ser: 1.25 mg/dL — ABNORMAL HIGH (ref 0.44–1.00)
GFR, Estimated: 51 mL/min — ABNORMAL LOW (ref >60)
Glucose, Bld: 311 mg/dL — ABNORMAL HIGH (ref 70–99)
Potassium: 3.4 mmol/L — ABNORMAL LOW (ref 3.5–5.1)
Sodium: 134 mmol/L — ABNORMAL LOW (ref 135–145)

## 2023-03-26 LAB — TSH: TSH: 44.67 u[IU]/mL — ABNORMAL HIGH (ref 0.350–4.500)

## 2023-03-26 LAB — T4, FREE: Free T4: 0.67 ng/dL (ref 0.61–1.12)

## 2023-03-26 LAB — LIPID PANEL
Cholesterol: 132 mg/dL (ref 0–200)
HDL: 35 mg/dL — ABNORMAL LOW (ref >40)
LDL Cholesterol: 83 mg/dL (ref 0–99)
Total CHOL/HDL Ratio: 3.8 RATIO
Triglycerides: 72 mg/dL (ref <150)
VLDL: 14 mg/dL (ref 0–40)

## 2023-03-26 LAB — BRAIN NATRIURETIC PEPTIDE: B Natriuretic Peptide: 86.7 pg/mL (ref 0.0–100.0)

## 2023-03-26 LAB — HEMOGLOBIN A1C
Hgb A1c MFr Bld: 10.7 % — ABNORMAL HIGH (ref 4.8–5.6)
Mean Plasma Glucose: 260 mg/dL

## 2023-03-26 LAB — MAGNESIUM: Magnesium: 1.9 mg/dL (ref 1.7–2.4)

## 2023-03-26 LAB — VITAMIN D 25 HYDROXY (VIT D DEFICIENCY, FRACTURES): Vit D, 25-Hydroxy: 32.68 ng/mL (ref 30–100)

## 2023-03-26 LAB — GLUCOSE, CAPILLARY
Glucose-Capillary: 178 mg/dL — ABNORMAL HIGH (ref 70–99)
Glucose-Capillary: 217 mg/dL — ABNORMAL HIGH (ref 70–99)

## 2023-03-26 LAB — VITAMIN B12: Vitamin B-12: 369 pg/mL (ref 180–914)

## 2023-03-26 MED ORDER — LEVOTHYROXINE SODIUM 75 MCG PO TABS
150.0000 ug | ORAL_TABLET | Freq: Every day | ORAL | Status: DC
  Administered 2023-03-26 – 2023-03-27 (×2): 150 ug via ORAL
  Filled 2023-03-26 (×2): qty 2

## 2023-03-26 MED ORDER — MECLIZINE HCL 25 MG PO TABS: 25.0000 mg | ORAL_TABLET | Freq: Three times a day (TID) | ORAL | Status: DC | PRN

## 2023-03-26 MED ORDER — METHOCARBAMOL 500 MG PO TABS
500.0000 mg | ORAL_TABLET | Freq: Four times a day (QID) | ORAL | Status: DC | PRN
  Administered 2023-03-27: 500 mg via ORAL
  Filled 2023-03-26: qty 1

## 2023-03-26 MED ORDER — ENOXAPARIN SODIUM 40 MG/0.4ML IJ SOSY
40.0000 mg | PREFILLED_SYRINGE | Freq: Every day | INTRAMUSCULAR | Status: DC
  Administered 2023-03-26 – 2023-03-27 (×2): 40 mg via SUBCUTANEOUS
  Filled 2023-03-26 (×2): qty 0.4

## 2023-03-26 MED ORDER — SERTRALINE HCL 50 MG PO TABS
75.0000 mg | ORAL_TABLET | Freq: Every day | ORAL | Status: DC
  Administered 2023-03-26 – 2023-03-27 (×2): 75 mg via ORAL
  Filled 2023-03-26 (×2): qty 2

## 2023-03-26 MED ORDER — MOMETASONE FURO-FORMOTEROL FUM 200-5 MCG/ACT IN AERO
2.0000 | INHALATION_SPRAY | Freq: Two times a day (BID) | RESPIRATORY_TRACT | Status: DC
  Filled 2023-03-26 (×2): qty 8.8

## 2023-03-26 MED ORDER — INSULIN GLARGINE-YFGN 100 UNIT/ML ~~LOC~~ SOLN
20.0000 [IU] | Freq: Every day | SUBCUTANEOUS | Status: DC
  Administered 2023-03-26: 20 [IU] via SUBCUTANEOUS
  Filled 2023-03-26 (×2): qty 0.2

## 2023-03-26 MED ORDER — INSULIN ASPART 100 UNIT/ML IJ SOLN
0.0000 [IU] | Freq: Three times a day (TID) | INTRAMUSCULAR | Status: DC
  Administered 2023-03-26: 3 [IU] via SUBCUTANEOUS
  Administered 2023-03-26: 8 [IU] via SUBCUTANEOUS
  Administered 2023-03-26: 5 [IU] via SUBCUTANEOUS
  Administered 2023-03-27 (×2): 3 [IU] via SUBCUTANEOUS

## 2023-03-26 MED ORDER — SENNOSIDES-DOCUSATE SODIUM 8.6-50 MG PO TABS
1.0000 | ORAL_TABLET | Freq: Every day | ORAL | Status: DC
  Administered 2023-03-26 (×2): 1 via ORAL
  Filled 2023-03-26 (×2): qty 1

## 2023-03-26 MED ORDER — ALBUTEROL SULFATE (2.5 MG/3ML) 0.083% IN NEBU: 3.0000 mL | INHALATION_SOLUTION | Freq: Four times a day (QID) | RESPIRATORY_TRACT | Status: DC | PRN

## 2023-03-26 MED ORDER — POTASSIUM CHLORIDE CRYS ER 20 MEQ PO TBCR
40.0000 meq | EXTENDED_RELEASE_TABLET | Freq: Two times a day (BID) | ORAL | Status: AC
  Administered 2023-03-26 (×2): 40 meq via ORAL
  Filled 2023-03-26 (×2): qty 2

## 2023-03-26 MED ORDER — GABAPENTIN 300 MG PO CAPS
300.0000 mg | ORAL_CAPSULE | Freq: Two times a day (BID) | ORAL | Status: DC
  Administered 2023-03-26 – 2023-03-27 (×3): 300 mg via ORAL
  Filled 2023-03-26 (×3): qty 1

## 2023-03-26 MED ORDER — ATORVASTATIN CALCIUM 40 MG PO TABS
40.0000 mg | ORAL_TABLET | Freq: Every day | ORAL | Status: DC
  Administered 2023-03-26 – 2023-03-27 (×2): 40 mg via ORAL
  Filled 2023-03-26 (×2): qty 1

## 2023-03-26 MED ORDER — POLYETHYLENE GLYCOL 3350 17 G PO PACK: 17.0000 g | PACK | Freq: Every day | ORAL | Status: DC | PRN

## 2023-03-26 MED ORDER — INSULIN GLARGINE-YFGN 100 UNIT/ML ~~LOC~~ SOLN
26.0000 [IU] | Freq: Every day | SUBCUTANEOUS | Status: DC
  Administered 2023-03-26: 26 [IU] via SUBCUTANEOUS
  Filled 2023-03-26 (×3): qty 0.26

## 2023-03-26 MED ORDER — ACETAMINOPHEN 325 MG PO TABS: 650.0000 mg | ORAL_TABLET | Freq: Four times a day (QID) | ORAL | Status: DC | PRN

## 2023-03-26 MED ORDER — ACETAMINOPHEN 650 MG RE SUPP: 650.0000 mg | Freq: Four times a day (QID) | RECTAL | Status: DC | PRN

## 2023-03-26 NOTE — ED Notes (Signed)
ED TO INPATIENT HANDOFF REPORT  ED Nurse Name and Phone #: [phone]   S Name/Age/Gender [name] 57 y.o. female Room/Bed: 011C/011C  Code Status   Code Status: Full Code  Home/SNF/Other Home Patient oriented to: self, place, time, and situation Is this baseline? Yes   Triage Complete: Triage complete  Chief Complaint Syncope [R55]  Triage Note PT arrived via POV, c/o bil extremity burning and edema. Denies missing doses of any home Rx, recent placement of cardiac monitor (4/1). States having two falls Thurs, and falling twice yesterday (4/2). Endorses SOB and dizzines on exertion. Endorses L sided numbness and tingling. Hx of s/s seen for today.    Allergies No Known Allergies  Level of Care/Admitting Diagnosis ED Disposition     ED Disposition  Admit   Condition  --   Comment  Hospital Area: [place] [[id number]]  Level of Care: Telemetry Cardiac [103]  May place patient in observation at [facility] or [place] if equivalent level of care is available:: No  Covid Evaluation: Asymptomatic - no recent exposure (last 10 days) testing not required  Diagnosis: Syncope [206001]  Admitting Physician: Lottie Mussel [id number]  Attending Physician: Lottie Mussel [id number]          B Medical/Surgery History Past Medical History:  Diagnosis Date   Acute diverticulitis    Acute metabolic encephalopathy    Acute pain of right knee 10/14/2016   Acute renal failure (ARF) ([place]) 01/26/2019   Anemia    Arthritis    Chest pain    a. 12/2021 Cor CTA: LM nl, LAD <25%, LCX nl, RCA nl. Cor Ca2+ = 1.29.   Chronic back pain    Complicated by neuropathy. ->  On Neurontin and duloxetine along with Flexeril and Voltaren gel.  Also uses PRN tramadol.   Colon polyps    COPD with asthma    On combination of albuterol Qvar and Symbicort   Depression    Diabetes mellitus without complication ([place])    type 2   Diabetic neuropathy (HCC)    Diastolic  dysfunction    a. 09/2015 Echo: EF 55-60%, GrI DD; b. 12/2021 Echo: EF 60-65%, no rwma, GrI DD, nl RV fxn. RVSP 28.64mmHg. Mild MR.   Diverticulitis large intestine 05/12/2020   Dyspnea    GERD (gastroesophageal reflux disease)    Heart murmur    Hyperlipidemia    Hypertension    Lipoma of left upper extremity    Palpitations    a. 12/2021 Zio: Predominantly sinus rhythm at 80 (38-136).  Frequent PACs (5.3%).  5 short runs of atrial tachycardia (longest 33 beats at 119 bpm.  Fastest 160 bpm).  Triggered events associated PACs and short runs of PAT.   Thyroid cancer [facility])    Urinary incontinence    Pessary present   Past Surgical History:  Procedure Laterality Date   7-Day Zio Patch Monitor  12/2021   Predominant sinus rhythm.  Rate range 38-136 bpm with an average of 80 bpm (bradycardia only noted during hours of sleep.  5 Atrial Runs-fastest was 5 beats at a max rate 162 bpm, longest was 17.2 seconds/33 beats at a rate of 119 bpm--not noted on patient trigger.  Frequent PACs noted (5.3%).  Rare PVCs.   ABDOMINAL HYSTERECTOMY     COLONOSCOPY WITH PROPOFOL N/A 10/02/2021   Procedure: COLONOSCOPY WITH PROPOFOL;  Surgeon: Lin Landsman, MD;  Location: [facility] ENDOSCOPY;  Service: Gastroenterology;  Laterality: N/A;   Coronary CT  angiogram  01/03/2022   Thoracic aortic atherosclerosis.  Coronary Calcium Score 1.29.  Minimal proximal LAD calcification. (<25%).   ESOPHAGOGASTRODUODENOSCOPY N/A 10/02/2021   Procedure: ESOPHAGOGASTRODUODENOSCOPY (EGD);  Surgeon: Lin Landsman, MD;  Location: [facility] ENDOSCOPY;  Service: Gastroenterology;  Laterality: N/A;   LIPOMA EXCISION Left 07/08/2022   upper arm   THYROIDECTOMY Bilateral 11/07/2022   Procedure: TOTAL THYROIDECTOMY;  Surgeon: Margaretha Sheffield, MD;  Location: [facility] ORS;  Service: ENT;  Laterality: Bilateral;   TRANSTHORACIC ECHOCARDIOGRAM  09/29/2015   EF 55-60%.  No R WMA.  GR 1 DD.  Is also normal valves.  Normal study.   TRANSTHORACIC  ECHOCARDIOGRAM  01/09/2022   EF 60 to 65%.  Mild to moderate LVH.  G1 DD.  Normal RV size and function.  Normal atrial sizes.  Normal valves.  NORMAL ECHO   TUBAL LIGATION       A IV Location/Drains/Wounds Patient Lines/Drains/Airways Status     Active Line/Drains/Airways     Name Placement date Placement time Site Days   Peripheral IV 03/25/23 20 G 1.88" Anterior;Right;Upper Arm 03/25/23  2000  Arm  1   Peripheral IV 03/26/23 22 G 1.75" Left;Anterior Forearm 03/26/23  0935  Forearm  less than 1            Intake/Output Last 24 hours No intake or output data in the 24 hours ending 03/26/23 1642  Labs/Imaging Results for orders placed or performed during the hospital encounter of 03/25/23 (from the past 48 hour(s))  Basic metabolic panel     Status: Abnormal   Collection Time: 03/25/23  4:23 PM  Result Value Ref Range   Sodium 131 (L) 135 - 145 mmol/L   Potassium 2.7 (LL) 3.5 - 5.1 mmol/L    Comment: CRITICAL RESULT CALLED TO, READ BACK BY AND VERIFIED WITH S BETHRAND,RN 1810 03/25/2023 WBOND   Chloride 90 (L) 98 - 111 mmol/L   CO2 23 22 - 32 mmol/L   Glucose, Bld 584 (HH) 70 - 99 mg/dL    Comment: CRITICAL RESULT CALLED TO, READ BACK BY AND VERIFIED WITH S BETHRAND,RN 1810 03/25/2023 WBOND Glucose reference range applies only to samples taken after fasting for at least 8 hours.    BUN 18 6 - 20 mg/dL   Creatinine, Ser 1.55 (H) 0.44 - 1.00 mg/dL   Calcium 9.9 8.9 - 10.3 mg/dL   GFR, Estimated 39 (L) >60 mL/min    Comment: (NOTE) Calculated using the CKD-EPI Creatinine Equation (2021)    Anion gap 18 (H) 5 - 15    Comment: Performed at [facility] [address], [place], [place] [address]  CBC     Status: None   Collection Time: 03/25/23  4:23 PM  Result Value Ref Range   WBC 6.2 4.0 - 10.5 K/uL   RBC 4.27 3.87 - 5.11 MIL/uL   Hemoglobin 13.8 12.0 - 15.0 g/dL   HCT 41.4 36.0 - 46.0 %   MCV 97.0 80.0 - 100.0 fL   MCH 32.3 26.0 - 34.0 pg   MCHC 33.3 30.0 -  36.0 g/dL   RDW 13.2 11.5 - 15.5 %   Platelets 253 150 - 400 K/uL   nRBC 0.0 0.0 - 0.2 %    Comment: Performed at [facility], [place] [address], [place], [place] [address]  Troponin I (High Sensitivity)     Status: Abnormal   Collection Time: 03/25/23  4:23 PM  Result Value Ref Range   Troponin I (  High Sensitivity) 29 (H) <18 ng/L    Comment: (NOTE) Elevated high sensitivity troponin I (hsTnI) values and significant  changes across serial measurements may suggest ACS but many other  chronic and acute conditions are known to elevate hsTnI results.  Refer to the "Links" section for chest pain algorithms and additional  guidance. Performed at [facility], [place] [address] [place], [place] [address]   I-Stat venous blood gas, ED     Status: Abnormal   Collection Time: 03/25/23  7:47 PM  Result Value Ref Range   pH, Ven 7.529 (H) 7.25 - 7.43   pCO2, Ven 35.4 (L) 44 - 60 mmHg   pO2, Ven 47 (H) 32 - 45 mmHg   Bicarbonate 29.5 (H) 20.0 - 28.0 mmol/L   TCO2 31 22 - 32 mmol/L   O2 Saturation 87 %   Acid-Base Excess 7.0 (H) 0.0 - 2.0 mmol/L   Sodium 133 (L) 135 - 145 mmol/L   Potassium 4.1 3.5 - 5.1 mmol/L   Calcium, Ion 1.09 (L) 1.15 - 1.40 mmol/L   HCT 43.0 36.0 - 46.0 %   Hemoglobin 14.6 12.0 - 15.0 g/dL   Sample type VENOUS   Hepatic function panel     Status: None   Collection Time: 03/25/23 10:00 PM  Result Value Ref Range   Total Protein 6.9 6.5 - 8.1 g/dL   Albumin 3.7 3.5 - 5.0 g/dL   AST 33 15 - 41 U/L   ALT 36 0 - 44 U/L   Alkaline Phosphatase 85 38 - 126 U/L   Total Bilirubin 0.7 0.3 - 1.2 mg/dL   Bilirubin, Direct 0.2 0.0 - 0.2 mg/dL   Indirect Bilirubin 0.5 0.3 - 0.9 mg/dL    Comment: Performed at [facility], [place] [address], [place], [place] [address]  Troponin I (High Sensitivity)     Status: Abnormal   Collection Time: 03/25/23 10:00 PM  Result Value Ref Range   Troponin I (High Sensitivity) 31 (H) <18 ng/L    Comment: (NOTE) Elevated high  sensitivity troponin I (hsTnI) values and significant  changes across serial measurements may suggest ACS but many other  chronic and acute conditions are known to elevate hsTnI results.  Refer to the "Links" section for chest pain algorithms and additional  guidance. Performed at [facility], [place] [address] [place], [place] [address]   Urinalysis, Routine w reflex microscopic -Urine, Clean Catch     Status: Abnormal   Collection Time: 03/25/23 11:22 PM  Result Value Ref Range   Color, Urine STRAW (A) YELLOW   APPearance CLEAR CLEAR   Specific Gravity, Urine 1.024 1.005 - 1.030   pH 5.0 5.0 - 8.0   Glucose, UA >=500 (A) NEGATIVE mg/dL   Hgb urine dipstick NEGATIVE NEGATIVE   Bilirubin Urine NEGATIVE NEGATIVE   Ketones, ur NEGATIVE NEGATIVE mg/dL   Protein, ur NEGATIVE NEGATIVE mg/dL   Nitrite NEGATIVE NEGATIVE   Leukocytes,Ua NEGATIVE NEGATIVE   RBC / HPF 0-5 0 - 5 RBC/hpf   WBC, UA 0-5 0 - 5 WBC/hpf   Bacteria, UA NONE SEEN NONE SEEN   Squamous Epithelial / HPF 0-5 0 - 5 /HPF    Comment: Performed at [facility], [place] [address] [place], [place] [address]  CBG monitoring, ED     Status: Abnormal   Collection Time: 03/25/23 11:24 PM  Result Value Ref Range   Glucose-Capillary 338 (H) 70 - 99 mg/dL  Comment: Glucose reference range applies only to samples taken after fasting for at least 8 hours.  Basic metabolic panel     Status: Abnormal   Collection Time: 03/26/23 12:13 AM  Result Value Ref Range   Sodium 134 (L) 135 - 145 mmol/L   Potassium 3.4 (L) 3.5 - 5.1 mmol/L   Chloride 102 98 - 111 mmol/L   CO2 20 (L) 22 - 32 mmol/L   Glucose, Bld 311 (H) 70 - 99 mg/dL    Comment: Glucose reference range applies only to samples taken after fasting for at least 8 hours.   BUN 18 6 - 20 mg/dL   Creatinine, Ser 1.25 (H) 0.44 - 1.00 mg/dL   Calcium 9.1 8.9 - 10.3 mg/dL   GFR, Estimated 51 (L) >60 mL/min    Comment: (NOTE) Calculated using the CKD-EPI Creatinine  Equation (2021)    Anion gap 12 5 - 15    Comment: Performed at [facility] [address], [place], [place] [address]  T4, free     Status: None   Collection Time: 03/26/23 12:13 AM  Result Value Ref Range   Free T4 0.67 0.61 - 1.12 ng/dL    Comment: (NOTE) Biotin ingestion may interfere with free T4 tests. If the results are inconsistent with the TSH level, previous test results, or the clinical presentation, then consider biotin interference. If needed, order repeat testing after stopping biotin. Performed at [facility], [place] [address] [place], [place] [address]   Magnesium     Status: None   Collection Time: 03/26/23 12:13 AM  Result Value Ref Range   Magnesium 1.9 1.7 - 2.4 mg/dL    Comment: Performed at [facility] [address] [place], [place] [address]  TSH     Status: Abnormal   Collection Time: 03/26/23 12:13 AM  Result Value Ref Range   TSH 44.670 (H) 0.350 - 4.500 uIU/mL    Comment: RESULT CONFIRMED BY AUTOMATED DILUTION Performed by a 3rd Generation assay with a functional sensitivity of <=0.01 uIU/mL. Performed at [facility], [place] [address], [place], [place] [address]   CBG monitoring, ED     Status: Abnormal   Collection Time: 03/26/23  2:11 AM  Result Value Ref Range   Glucose-Capillary 251 (H) 70 - 99 mg/dL    Comment: Glucose reference range applies only to samples taken after fasting for at least 8 hours.  Lipid panel     Status: Abnormal   Collection Time: 03/26/23  3:55 AM  Result Value Ref Range   Cholesterol 132 0 - 200 mg/dL   Triglycerides 72 <150 mg/dL   HDL 35 (L) >40 mg/dL   Total CHOL/HDL Ratio 3.8 RATIO   VLDL 14 0 - 40 mg/dL   LDL Cholesterol 83 0 - 99 mg/dL    Comment:        Total Cholesterol/HDL:CHD Risk Coronary Heart Disease Risk Table                     Men   Women  1/2 Average Risk   3.4   3.3  Average Risk       5.0   4.4  2 X Average Risk   9.6   7.1  3 X Average Risk  23.4   11.0         Use the calculated Patient Ratio above and the CHD Risk Table to determine the patient's CHD Risk.  ATP III CLASSIFICATION (LDL):  <100     mg/dL   Optimal  100-129  mg/dL   Near or Above                    Optimal  130-159  mg/dL   Borderline  160-189  mg/dL   High  >190     mg/dL   Very High Performed at [facility] [address] [place], [place] [address]   Vitamin B12     Status: None   Collection Time: 03/26/23  3:55 AM  Result Value Ref Range   Vitamin B-12 369 180 - 914 pg/mL    Comment: HEMOLYSIS AT THIS LEVEL MAY AFFECT RESULT (NOTE) This assay is not validated for testing neonatal or myeloproliferative syndrome specimens for Vitamin B12 levels. Performed at [facility], [place] [address] [place], [place] [address]   VITAMIN D 25 Hydroxy (Vit-D Deficiency, Fractures)     Status: None   Collection Time: 03/26/23  3:55 AM  Result Value Ref Range   Vit D, 25-Hydroxy 32.68 30 - 100 ng/mL    Comment: (NOTE) Vitamin D deficiency has been defined by the [place] practice guideline as a level of serum 25-OH  vitamin D less than 20 ng/mL (1,2). The Endocrine Society went on to  further define vitamin D insufficiency as a level between 21 and 29  ng/mL (2).  1. IOM (Institute of Medicine). 2010. Dietary reference intakes for  calcium and D. [place]: The [employer]. 2. Holick MF, Binkley [place], Bischoff-Ferrari HA, et al. Evaluation,  treatment, and prevention of vitamin D deficiency: an Endocrine  Society clinical practice guideline, JCEM. 2011 Jul; 96(7): 1911-30.  Performed at [facility], [place] [address] [place], [place] [address]   Brain natriuretic peptide     Status: None   Collection Time: 03/26/23  3:55 AM  Result Value Ref Range   B Natriuretic Peptide 86.7 0.0 - 100.0 pg/mL    Comment: Performed at [facility] [address] [place], [place] [address]  CBG  monitoring, ED     Status: Abnormal   Collection Time: 03/26/23  6:17 AM  Result Value Ref Range   Glucose-Capillary 196 (H) 70 - 99 mg/dL    Comment: Glucose reference range applies only to samples taken after fasting for at least 8 hours.  CBG monitoring, ED     Status: Abnormal   Collection Time: 03/26/23  8:35 AM  Result Value Ref Range   Glucose-Capillary 235 (H) 70 - 99 mg/dL    Comment: Glucose reference range applies only to samples taken after fasting for at least 8 hours.  CBG monitoring, ED     Status: Abnormal   Collection Time: 03/26/23 11:55 AM  Result Value Ref Range   Glucose-Capillary 257 (H) 70 - 99 mg/dL    Comment: Glucose reference range applies only to samples taken after fasting for at least 8 hours.   DG Abd 1 View  Result Date: 03/26/2023 CLINICAL DATA:  [id number] with abdominal pain lower extremity swelling. EXAM: ABDOMEN - 1 VIEW COMPARISON:  CT abdomen and pelvis 09/25/2022 FINDINGS: The bowel gas pattern is nonobstructive, with moderate retained stool in the ascending and transverse colon. No radio-opaque calculi or other significant radiographic abnormality are seen. IMPRESSION: Nonobstructive bowel gas pattern with moderate retained stool in the ascending and transverse colon. Electronically Signed   By: Lanny Hurst  Chesser M.D.   On: 03/26/2023 00:42   DG Chest Port 1 View  Result Date: 03/25/2023 CLINICAL DATA:  Weakness. EXAM: PORTABLE CHEST 1 VIEW COMPARISON:  Chest radiograph dated 09/25/2022. FINDINGS: Minimal bibasilar atelectasis. No focal consolidation, pleural effusion, or pneumothorax. The cardiac silhouette is within limits. The aorta is tortuous. No acute osseous pathology. IMPRESSION: No active disease. Electronically Signed   By: Anner Crete M.D.   On: 03/25/2023 19:39    Pending Labs Unresulted Labs (From admission, onward)     Start     Ordered   03/27/23 [id number]  Basic metabolic panel  Tomorrow morning,   R        03/26/23 1433   03/27/23 0500   Magnesium  Tomorrow morning,   R        03/26/23 1433   03/26/23 0212  Hemoglobin A1c  Once,   R       Comments: To assess prior glycemic control    03/26/23 0214            Vitals/Pain Today's Vitals   03/26/23 1100 03/26/23 1200 03/26/23 1300 03/26/23 1545  BP: (!) 134/102 (!) 124/97  122/75  Pulse:  77 70 85  Resp: 18 (!) 21 20 (!) 21  Temp: 97.9 F (36.6 C)     TempSrc:      SpO2:  100% 100% 95%  PainSc:        Isolation Precautions No active isolations  Medications Medications  enoxaparin (LOVENOX) injection 40 mg (40 mg Subcutaneous Given 03/26/23 0922)  acetaminophen (TYLENOL) tablet 650 mg (has no administration in time range)    Or  acetaminophen (TYLENOL) suppository 650 mg (has no administration in time range)  senna-docusate (Senokot-S) tablet 1 tablet (1 tablet Oral Given 03/26/23 0250)  polyethylene glycol (MIRALAX / GLYCOLAX) packet 17 g (has no administration in time range)  insulin aspart (novoLOG) injection 0-15 Units (8 Units Subcutaneous Given 03/26/23 1258)  albuterol (PROVENTIL) (2.5 MG/3ML) 0.083% nebulizer solution 3 mL (has no administration in time range)  mometasone-formoterol (DULERA) 200-5 MCG/ACT inhaler 2 puff (has no administration in time range)  gabapentin (NEURONTIN) capsule 300 mg (300 mg Oral Given 03/26/23 0922)  levothyroxine (SYNTHROID) tablet 150 mcg (150 mcg Oral Given 03/26/23 0620)  meclizine (ANTIVERT) tablet 25 mg (has no administration in time range)  methocarbamol (ROBAXIN) tablet 500 mg (has no administration in time range)  sertraline (ZOLOFT) tablet 75 mg (75 mg Oral Given 03/26/23 0922)  atorvastatin (LIPITOR) tablet 40 mg (40 mg Oral Given 03/26/23 0922)  insulin glargine-yfgn (SEMGLEE) injection 26 Units (has no administration in time range)  potassium chloride 10 mEq in 100 mL IVPB (0 mEq Intravenous Stopped 03/25/23 2353)  sodium chloride 0.9 % bolus 1,000 mL (0 mLs Intravenous Stopped 03/26/23 0144)  potassium chloride SA  (KLOR-CON M) CR tablet 40 mEq (40 mEq Oral Given 03/26/23 [id number])    Mobility walks with person assist     Focused Assessments Cardiac Assessment Handoff:    Lab Results  Component Value Date   TROPONINI <0.03 01/26/2019   Lab Results  Component Value Date   DDIMER 9.97 (H) 04/04/2013   Does the Patient currently have chest pain? No   , Pulmonary Assessment Handoff:  Lung sounds:   O2 Device: Room Air      R Recommendations: See Admitting Provider Note  Report given to:   Additional Notes:
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
## 2023-03-26 NOTE — Inpatient Diabetes Management (Signed)
Inpatient Diabetes Program Recommendations  AACE/ADA: New Consensus Statement on Inpatient Glycemic Control (2015)  Target Ranges:  Prepandial:   less than 140 mg/dL      Peak postprandial:   less than 180 mg/dL (1-2 hours)      Critically ill patients:  140 - 180 mg/dL   Lab Results  Component Value Date   GLUCAP 257 (H) 03/26/2023   HGBA1C 10.7 (H) 09/25/2022    Review of Glycemic Control  Latest Reference Range & Units 03/25/23 23:24 03/26/23 02:11 03/26/23 06:17 03/26/23 08:35 03/26/23 11:55  Glucose-Capillary 70 - 99 mg/dL 338 (H) 251 (H) 196 (H) 235 (H) 257 (H)  (H): Data is abnormally high Diabetes history: Type 2 DM Outpatient Diabetes medications: Tresiba 20 units QD, Jardiance 10 mg QD, Metformin 1000 mg BID, Rybelsus 7 mg QD Current orders for Inpatient glycemic control: Semglee 20 units QHS, Novolog 0-15 units TID  Inpatient Diabetes Program Recommendations:    Consider increasing Semglee 26 units QD.  Called [facility] pharmacy to confirm outpatient diabetes medications. A1C in process.   Spoke with patient regarding outpatient diabetes management. Verifies home medications but delayed in responses that involve specific answers.  Explained what a A1c is and what it measures. Also reviewed goal A1c with patient, importance of good glucose control @ home, and blood sugar goals. Reviewed patho of DM, need for insulin, impact of missed doses, vascular changes, and commorbidities.  Patient has a meter and checks CBGs (200-300 mg/dLs) Reviewed and stressed importance of when to follow up with Md. Patient plans to make appointment.  No further issues obtaining medications.  Admits to drinking sweet tea. Encouraged alternatives and CHO mindfulness.   Thanks, Bronson Curb, MSN, RNC-OB Diabetes Coordinator [phone] (8a-5p)

## 2023-03-26 NOTE — Progress Notes (Signed)
Subjective: Patient states that she was in the parking lot getting out of the car, and a person gave her a ride to the ED. At restaurant, she stood up to get salt and pepper and fell over. Never happened before. She sates initially had swelling on her toes and she notes all of her symptoms happen on her left side.Drove barely. Got to parking lot, everything goes black. This dizziness was different, she felt chest pains. Dr. Thamas Jaegers. She states she is still feeling dizzy and the room is spinning and sees two of Dr. Stann Mainland. She thinks her dizziness is still present right now and feels like her vertigo. She states she is still having pins and needles type feeling in her left arm. She states that it hurts her to lay straight flat. She states that she has to lay on the side. She uses 3 pillow at night. [facility]. This happened at 1130.   Objective:  Vital signs in last 24 hours: Vitals:   03/26/23 0619 03/26/23 1000 03/26/23 1100 03/26/23 1200  BP:  (!) 154/101 (!) 134/102 (!) 124/97  Pulse:  69  77  Resp:  16 18 (!) 21  Temp: 97.6 F (36.4 C)  97.9 F (36.6 C)   TempSrc: Oral     SpO2:  98%  100%   Constitutional: Well nourished, well developed, appears comfortable  HENT: Normocephalic and atraumatic.  Eyes:  Exophthalmos bilaterally. Ptosis bilaterally.   Cardiovascular: Regular rate, regular rhythm. No murmurs, rubs, or gallops. 2+bilateral radial pulses. No JVD. Pulmonary: Normal respiratory effort on RA. No wheezes, rales, rhonchi, or crackles.   Musculoskeletal: Loop recorder on left chest.    Neurological: CN 2-12 intact.Decreased sensation of LLE up to the mid shin. Skin: warm and dry, minimal edema to the ankles bilaterally non-pitting  Assessment/Plan:  Principal Problem:   Syncope  [name] is a 57 y/o female with past medical history of HTN, HLD, T2DM w/ neuropathy, diastolic heart dysfunction, COPD, papillary thyroid carcinoma s/p  thyroidectomy, anemia, GERD, diverticulitis, arthritis, chronic back pain, depression, migraine, chronic vertigo that presents after a syncopal episode and was admitted for further evaluation of syncope.    #Syncope #Palpitations Patient has been experiencing palpitations over the last several months.  Currently followed by cardiologist Dr. Ellyn Hack as outpatient with most recent visit in 08/2022. Noted to have frequent PACs on prior long term monitoring placed by [facility].Has loop recorder in place for palpitations per patient placed on March 30 with Dr. Edson Snowball at [place].  She presents after syncopal episode involving prodrome of lightheadedness, palpitations, and chest pain. Orthostatic vitals negative, although suspect glc to >500 on admission with AKI led to orthostatic hypotension. Noted to have hypokalemia of 2.7 on admission with QT prolongation. Patient reports CP and palpitations led to dizziness and black out of vision while driving, different from the orthostatic episode at dinner and vertigo. This is more concerning for cardiac cause. Perhaps an arrhythmia in the setting of hypokalemia. No evidence of ischemia per troponins and unchanged EKG. Continued to have palpitations while on tele although no abnormal rhythms during this time. Still pending echo, need to evaluate systolic function and valves. Will repeat orthostatics, EKG, monitor on tele overnight, recheck AM potassium given recurrent event with blacking out of vision/dizziness/nausea/warmth/diaphoresis with standing while working with PT.. -Repeat orthostatic vital signs -Monitor telemetry -Pending echo -PT/OT  #Hx of Diastolic heart dysfunction #Bilateral LE edema  Patient presents with chronic orthopnea and recent lower  extremity swelling. Echo from 12/2021 w/ EF 60-65%, LVH, and G1DD.  BNP wnl, no evidence of volume overload on exam. Also taking amlodipine at home which can cause lower extremity swelling.  Will hold home amlodipine at  this time. Will obtain echo to assess for worsening cardiac function as a cause of swelling. Will obtain BNP to assess for signs of volume overload. -Pending BNP -Pending echo -Daily weights -Strict Is/Os  #T2DM w/ neuropathy Patient has longstanding poorly controlled diabetes.  A1c from 6 months ago was 10.7%.  Currently taking metformin 1000 mg BID, Jardiance 10 mg daily, semaglutide 7 mg daily, and insulin degludec 20 units daily.  Also taking gabapentin 300 mg BID for neuropathy.  On admission, glucose elevated at 584 w/ AG of 18. VBG without signs of acidosis.  UA without ketones.  Low suspicion for DKA at this time.  Will treat with SSI and long-acting insulin. -Semglee 20 units daily -Home gabapentin -SSI moderate -Trend CBGs   #Hx of Papillary Thyroid Carcinoma s/p thyroidectomy -Continue outpatient oncology f/u -Increase Levothyroxine to 150 mcg    #Chronic Vertigo MRI brain from hospitalization in 09/2022 was negative for acute findings.  Patient was discharged on meclizine 25 mg daily.  Uncertain if vertigo is contributing to her recurrent falls at home at this time.  Will continue home meclizine. Suspect patient would benefit from continued outpatient neurology f/u.  -Outpatient neurology f/u  -Home meclizine    #Elevated Creatinine  Creatinine five months ago was approximately 0.8.  Repeat BMP during admission demonstrates creatinine improved to 1.25 with GFR 51.  Suspect acute rise in creatinine may be secondary to osmotic diuresis in the setting of hyperglycemia vs possible diastolic dysfunction. Will continue to trend renal function. -Trend BMP   #HTN Given recent syncopal episodes, will hold home BP meds for now. -Hold amlodipine, losartan, metoprolol   #HLD -Start atorvastatin 40 mg daily    #Hx of Vitamin D deficiency Vitamin D level was low 3 years ago at 42.  Patient is taking vitamin Vitamin D3 1.25 mg weekly at homeVitamin D during admission 32.68.    #COPD -Albuterol nebs PRN -Dulera inhaler   #Arthritis #Chronic back pain -Home methocarbamol   -PRN Tylenol   #Depression -Continue home sertraline   #GERD -Hold home omeprazole   Prior to Admission Living Arrangement: Anticipated Discharge Location: Barriers to Discharge: Dispo: Anticipated discharge in approximately 1 day(s).   Iona Coach, MD 03/26/2023, 1:51 PM Pager: Iona Coach, MD Internal Medicine Resident, PGY-1 [facility]  Pager: [phone] After 5pm on weekdays and 1pm on weekends: On Call pager [phone]

## 2023-03-26 NOTE — Progress Notes (Signed)
Echocardiogram 2D Echocardiogram has been performed.  [name] 03/26/2023, 4:39 PM

## 2023-03-26 NOTE — Evaluation (Signed)
Occupational Therapy Evaluation Patient Details Name: [name] MRN: [id number] DOB: 1966/06/01 Today's Date: 03/26/2023   History of Present Illness [name] is a 57 y/o female that presents after a syncopal episode. Workup has not determined cause at this time possible, hypotension.  Past medical history of HTN, HLD, T2DM w/ neuropathy, diastolic heart dysfunction, COPD, papillary thyroid carcinoma s/p thyroidectomy, anemia, GERD, diverticulitis, arthritis, chronic back pain, depression, migraine, chronic vertigo.   Clinical Impression   Pt currently at min assist level for simulated selfcare and functional transfers with hand held assist.  Elevated BP noted at 138/100, 142/103, and 141/107 in sitting.  HR remained stable in the mid 80s with limited activity.  Pt with increased generalized pain reported as a burning sensation all over.  She states that she was evicted a month ago and has been living in her car and showering at the [facility].  She reports she will stay with her friend initially once discharged.  Feel she will benefit from acute care OT at this time in order to increase ADL independence.  Will continue to follow.     Recommendations for follow up therapy are one component of a multi-disciplinary discharge planning process, led by the attending physician.  Recommendations may be updated based on patient status, additional functional criteria and insurance authorization.   Assistance Recommended at Discharge Frequent or constant Supervision/Assistance  Patient can return home with the following A little help with walking and/or transfers;A little help with bathing/dressing/bathroom;Assistance with cooking/housework;Assist for transportation;Help with stairs or ramp for entrance    Functional Status Assessment  Patient has had a recent decline in their functional status and demonstrates the ability to make significant improvements in function in a reasonable and  predictable amount of time.  Equipment Recommendations  Other (comment) (TBD next venue of care)       Precautions / Restrictions Precautions Precautions: Fall Restrictions Weight Bearing Restrictions: No      Mobility Bed Mobility Overal bed mobility: Needs Assistance Bed Mobility: Supine to Sit, Sit to Supine     Supine to sit: Supervision, HOB elevated Sit to supine: Supervision        Transfers Overall transfer level: Needs assistance   Transfers: Sit to/from Stand, Bed to chair/wheelchair/BSC Sit to Stand: Min assist     Step pivot transfers: Min assist     General transfer comment: Hand held assist needed for safety secondary to decreased balance with short distance mobility to the door of the room and back.  Pt with decreased smoothness of gait pattern with decreased sustained weightshift noted with stepping.      Balance Overall balance assessment: Needs assistance   Sitting balance-Leahy Scale: Fair     Standing balance support: Single extremity supported Standing balance-Leahy Scale: Poor Standing balance comment: UE support needed for mobility                           ADL either performed or assessed with clinical judgement   ADL Overall ADL's : Needs assistance/impaired Eating/Feeding: Independent;Sitting   Grooming: Wash/dry hands;Sitting Grooming Details (indicate cue type and reason): simulated setup Upper Body Bathing: Set up;Sitting Upper Body Bathing Details (indicate cue type and reason): simulated Lower Body Bathing: Minimal assistance;Sit to/from stand Lower Body Bathing Details (indicate cue type and reason): simulated Upper Body Dressing : Set up;Sitting Upper Body Dressing Details (indicate cue type and reason): simulated Lower Body Dressing: Minimal assistance;Sit to/from stand  Toilet Transfer: Minimal assistance;Ambulation Toilet Transfer Details (indicate cue type and reason): hand held assist  simulated Toileting- Clothing Manipulation and Hygiene: Minimal assistance;Sit to/from stand       Functional mobility during ADLs: Minimal assistance (hand held) General ADL Comments: Pt with slightly elevated BP during limited session with nursing made aware.  In sitting BP at 138/100. 144/103,141/107 with HR at round 84 BPM and O2 at 100% on room air.  Pt reports living in her care currently but will stay with a friend for a few days once discharged.  Noted balance and mobility deficits with decreased ability to take smooth steps with short distance mobility to and from the door without assistive device.  Hand held assist needed.     Vision Baseline Vision/History: 4 Cataracts Ability to See in Adequate Light: 1 Impaired Patient Visual Report: No change from baseline Vision Assessment?: Yes Eye Alignment: Within Functional Limits Ocular Range of Motion: Within Functional Limits Alignment/Gaze Preference: Within Defined Limits Tracking/Visual Pursuits: Able to track stimulus in all quads without difficulty Convergence: Within functional limits Visual Fields: No apparent deficits Additional Comments: Pt with some end rage horizontal nystagmus with gaze holding on the left greater than 3 beats.  She reports increased dizziness with supine to sit, standing, and with transition back to supine.  Will continue to look at further in treatment for ossible vestibular cause.     Perception  WFLs   Praxis  WFLs    Pertinent Vitals/Pain Pain Assessment Pain Assessment: 0-10 Pain Score: 6  Pain Location: generalized pain Pain Descriptors / Indicators: Discomfort, Burning Pain Intervention(s): Limited activity within patient's tolerance, Monitored during session, Repositioned     Hand Dominance Right   Extremity/Trunk Assessment Upper Extremity Assessment Upper Extremity Assessment: LUE deficits/detail LUE Deficits / Details: Shoulder flexion 0-90 degrees AROM with pain with AAROM greater  than 90 degrees.  Elbow 3+/5, grip 3+/5, decreased efficiency with oppostion of fingers to thumb. LUE: Shoulder pain with ROM LUE Sensation: decreased light touch (pt able to detect light touch throughout but reports that acuity is not the same as the RUE)   Lower Extremity Assessment Lower Extremity Assessment: Defer to PT evaluation   Cervical / Trunk Assessment Cervical / Trunk Assessment: Normal   Communication Communication Communication: No difficulties   Cognition Arousal/Alertness: Awake/alert Behavior During Therapy: WFL for tasks assessed/performed Overall Cognitive Status: Within Functional Limits for tasks assessed                                                  Home Living Family/patient expects to be discharged to:: Private residence Living Arrangements: Alone Available Help at Discharge: Family (Has family but says they won't help you) Type of Home: Homeless (been staying in her car currently she was evicted from her apartment)                       Home Equipment: Rollator (4 wheels);Cane - single Barista (2 wheels)   Additional Comments: Pt has been staying in car the past month and go in takes a shower at [facility] and wash clothes.  Reports that she will go stay with a friend for a few days after leaving the hospital.      Prior Functioning/Environment Prior Level of Function : Independent/Modified Independent  Mobility Comments: Recent falls without use of assistive device.  She didn't want to be seen using them          OT Problem List: Decreased strength;Decreased knowledge of use of DME or AE;Decreased coordination;Impaired UE functional use;Impaired balance (sitting and/or standing);Impaired sensation;Pain;Decreased activity tolerance      OT Treatment/Interventions: Self-care/ADL training;Balance training;Patient/family education;Therapeutic exercise;Therapeutic activities;DME and/or AE  instruction;Neuromuscular education    OT Goals(Current goals can be found in the care plan section) Acute Rehab OT Goals Patient Stated Goal: Pt wants to get her balance better OT Goal Formulation: With patient Time For Goal Achievement: 04/09/23 Potential to Achieve Goals: Good  OT Frequency: Min 2X/week       AM-PAC OT "6 Clicks" Daily Activity     Outcome Measure Help from another person eating meals?: None Help from another person taking care of personal grooming?: A Little Help from another person toileting, which includes using toliet, bedpan, or urinal?: A Little Help from another person bathing (including washing, rinsing, drying)?: A Little Help from another person to put on and taking off regular upper body clothing?: A Little Help from another person to put on and taking off regular lower body clothing?: A Little 6 Click Score: 19   End of Session Equipment Utilized During Treatment: Gait belt Nurse Communication: Mobility status;Other (comment) (elevated diastolic BP)  Activity Tolerance: Patient tolerated treatment well Patient left: in bed  OT Visit Diagnosis: Unsteadiness on feet (R26.81);Muscle weakness (generalized) (M62.81);Repeated falls (R29.6);Pain;History of falling (Z91.81);Other abnormalities of gait and mobility (R26.89) Pain - part of body:  (generalized)                Time: [id number] OT Time Calculation (min): 34 min Charges:  OT General Charges $OT Visit: 1 Visit OT Evaluation $OT Eval Moderate Complexity: 1 Mod OT Treatments $Self Care/Home Management : 8-22 mins   Clyda Greener, OTR/L [place]  Office [phone] 03/26/2023

## 2023-03-26 NOTE — Progress Notes (Signed)
Physical Therapy Evaluation Patient Details Name: [name] MRN: [id number] DOB: February 08, 1966 Today's Date: 03/26/2023  History of Present Illness  57 y/o female that presents 4/3 after a syncopal episode. Workup has not determined cause at this time possible, hypotension.  Past medical history of HTN, HLD, T2DM w/ neuropathy, diastolic heart dysfunction, COPD, papillary thyroid carcinoma s/p thyroidectomy, anemia, GERD, diverticulitis, arthritis, chronic back pain, depression, migraine, chronic vertigo.  Clinical Impression  Pt was seen for progressing with mobility on side of bed and to stand, take steps.  Pt is requiring support to move, and recommend her to have a RW for support, as well as to receive therapy in acute setting.  Pt is expecting to go home with a friend, and if this does not happen, will need to consider safety and inpt care such as a SNF.  For now, pt is anticipating this additional help and could then have HHPT follow up.  Will need to monitor her situation closely as pt is a higher fall risk and in a precarious living situation.  Follow acute PT goals as are outlined on POC below.       Recommendations for follow up therapy are one component of a multi-disciplinary discharge planning process, led by the attending physician.  Recommendations may be updated based on patient status, additional functional criteria and insurance authorization.  Follow Up Recommendations       Assistance Recommended at Discharge Intermittent Supervision/Assistance  Patient can return home with the following  A little help with walking and/or transfers;A little help with bathing/dressing/bathroom;Assistance with cooking/housework;Assist for transportation;Help with stairs or ramp for entrance    Equipment Recommendations Rolling walker (2 wheels)  Recommendations for Other Services       Functional Status Assessment Patient has had a recent decline in their functional status and  demonstrates the ability to make significant improvements in function in a reasonable and predictable amount of time.     Precautions / Restrictions Precautions Precautions: Fall Precaution Comments: monitor vitals Restrictions Weight Bearing Restrictions: No      Mobility  Bed Mobility Overal bed mobility: Needs Assistance Bed Mobility: Supine to Sit, Sit to Supine     Supine to sit: Supervision Sit to supine: Supervision   General bed mobility comments: supervision for safety but pt can move slowly  to and from side of bed    Transfers Overall transfer level: Needs assistance Equipment used: 1 person hand held assist Transfers: Sit to/from Stand, Bed to chair/wheelchair/BSC Sit to Stand: Min guard   Step pivot transfers: Min assist            Ambulation/Gait Ambulation/Gait assistance: Min guard, Min assist Gait Distance (Feet): 6 Feet Assistive device: 1 person hand held assist Gait Pattern/deviations: Step-to pattern, Decreased stride length Gait velocity: reduced Gait velocity interpretation: <1.31 ft/sec, indicative of household ambulator Pre-gait activities: standing balance General Gait Details: slow progression as a RW was not located for visit, but pt can move with care to get a few feet  Stairs            Wheelchair Mobility    Modified Rankin (Stroke Patients Only)       Balance Overall balance assessment: Needs assistance Sitting-balance support: Feet supported Sitting balance-Leahy Scale: Fair     Standing balance support: Bilateral upper extremity supported Standing balance-Leahy Scale: Poor Standing balance comment: requires UE support for safety  Pertinent Vitals/Pain Pain Assessment Pain Assessment: No/denies pain    Home Living Family/patient expects to be discharged to:: Private residence Living Arrangements: Alone Available Help at Discharge: Family Type of Home: Homeless (living  in her car, evicted from her apt) Home Access: Level entry       Home Layout: One level Home Equipment: Rollator (4 wheels);Cane - single Barista (2 wheels) Additional Comments: lives in her car    Prior Function Prior Level of Function : Independent/Modified Independent             Mobility Comments: had falls but declined to use AD       Hand Dominance   Dominant Hand: Right    Extremity/Trunk Assessment   Upper Extremity Assessment Upper Extremity Assessment: Defer to OT evaluation    Lower Extremity Assessment Lower Extremity Assessment: Generalized weakness    Cervical / Trunk Assessment Cervical / Trunk Assessment: Normal  Communication   Communication: No difficulties  Cognition Arousal/Alertness: Awake/alert Behavior During Therapy: WFL for tasks assessed/performed Overall Cognitive Status: Within Functional Limits for tasks assessed                                          General Comments General comments (skin integrity, edema, etc.): Pt is up to move from bed to sit and then stand, transition to [facility] and took a few steps but got nauseated and returned to bed    Exercises     Assessment/Plan    PT Assessment Patient needs continued PT services  PT Problem List Decreased strength;Decreased balance;Decreased activity tolerance;Decreased mobility;Decreased coordination;Decreased knowledge of use of DME       PT Treatment Interventions DME instruction;Gait training;Functional mobility training;Therapeutic activities;Therapeutic exercise;Balance training;Neuromuscular re-education;Patient/family education    PT Goals (Current goals can be found in the Care Plan section)  Acute Rehab PT Goals Patient Stated Goal: to walk and get strength back PT Goal Formulation: With patient Time For Goal Achievement: 04/02/23 Potential to Achieve Goals: Good    Frequency Min 3X/week     Co-evaluation                AM-PAC PT "6 Clicks" Mobility  Outcome Measure Help needed turning from your back to your side while in a flat bed without using bedrails?: A Little Help needed moving from lying on your back to sitting on the side of a flat bed without using bedrails?: A Little Help needed moving to and from a bed to a chair (including a wheelchair)?: A Little Help needed standing up from a chair using your arms (e.g., wheelchair or bedside chair)?: A Little Help needed to walk in hospital room?: A Little Help needed climbing 3-5 steps with a railing? : A Lot 6 Click Score: 17    End of Session Equipment Utilized During Treatment: Gait belt Activity Tolerance: Treatment limited secondary to medical complications (Comment) (nausea with standing) Patient left: in bed;with call bell/phone within reach Nurse Communication: Mobility status (reported nausea) PT Visit Diagnosis: Unsteadiness on feet (R26.81);Muscle weakness (generalized) (M62.81);Difficulty in walking, not elsewhere classified (R26.2)    Time: 1240-1305 PT Time Calculation (min) (ACUTE ONLY): 25 min   Charges:   PT Evaluation $PT Eval Moderate Complexity: 1 Mod PT Treatments $Therapeutic Activity: 8-22 mins       Ramond Dial 03/26/2023, 4:53 PM  Mee Hives, PT PhD Acute Rehab Dept. Number: [facility]  [phone] and [place]

## 2023-03-27 ENCOUNTER — Telehealth (HOSPITAL_COMMUNITY): Payer: Self-pay | Admitting: Pharmacy Technician

## 2023-03-27 ENCOUNTER — Other Ambulatory Visit (HOSPITAL_COMMUNITY): Payer: Self-pay

## 2023-03-27 DIAGNOSIS — Z794 Long term (current) use of insulin: Secondary | ICD-10-CM | POA: Diagnosis not present

## 2023-03-27 DIAGNOSIS — Z79899 Other long term (current) drug therapy: Secondary | ICD-10-CM | POA: Diagnosis not present

## 2023-03-27 DIAGNOSIS — E1165 Type 2 diabetes mellitus with hyperglycemia: Secondary | ICD-10-CM | POA: Diagnosis not present

## 2023-03-27 DIAGNOSIS — E1140 Type 2 diabetes mellitus with diabetic neuropathy, unspecified: Secondary | ICD-10-CM | POA: Diagnosis not present

## 2023-03-27 DIAGNOSIS — I10 Essential (primary) hypertension: Secondary | ICD-10-CM | POA: Diagnosis not present

## 2023-03-27 DIAGNOSIS — R55 Syncope and collapse: Secondary | ICD-10-CM | POA: Diagnosis not present

## 2023-03-27 DIAGNOSIS — C73 Malignant neoplasm of thyroid gland: Secondary | ICD-10-CM | POA: Diagnosis not present

## 2023-03-27 DIAGNOSIS — R0601 Orthopnea: Secondary | ICD-10-CM | POA: Diagnosis not present

## 2023-03-27 DIAGNOSIS — R002 Palpitations: Secondary | ICD-10-CM | POA: Diagnosis not present

## 2023-03-27 DIAGNOSIS — I951 Orthostatic hypotension: Secondary | ICD-10-CM | POA: Insufficient documentation

## 2023-03-27 DIAGNOSIS — Z7984 Long term (current) use of oral hypoglycemic drugs: Secondary | ICD-10-CM | POA: Diagnosis not present

## 2023-03-27 LAB — ECHOCARDIOGRAM COMPLETE
AR max vel: 2.29 cm2
AV Area VTI: 1.87 cm2
AV Area mean vel: 2.11 cm2
AV Mean grad: 5 mmHg
AV Peak grad: 8.8 mmHg
Ao pk vel: 1.48 m/s
Area-P 1/2: 3.31 cm2
S' Lateral: 2.6 cm

## 2023-03-27 LAB — GLUCOSE, CAPILLARY
Glucose-Capillary: 160 mg/dL — ABNORMAL HIGH (ref 70–99)
Glucose-Capillary: 117 mg/dL — ABNORMAL HIGH (ref 70–99)
Glucose-Capillary: 206 mg/dL — ABNORMAL HIGH (ref 70–99)
Glucose-Capillary: 196 mg/dL — ABNORMAL HIGH (ref 70–99)

## 2023-03-27 LAB — BASIC METABOLIC PANEL
Anion gap: 7 (ref 5–15)
BUN: 18 mg/dL (ref 6–20)
CO2: 25 mmol/L (ref 22–32)
Calcium: 9.2 mg/dL (ref 8.9–10.3)
Chloride: 105 mmol/L (ref 98–111)
Creatinine, Ser: 1.2 mg/dL — ABNORMAL HIGH (ref 0.44–1.00)
GFR, Estimated: 53 mL/min — ABNORMAL LOW (ref >60)
Glucose, Bld: 166 mg/dL — ABNORMAL HIGH (ref 70–99)
Potassium: 4.2 mmol/L (ref 3.5–5.1)
Sodium: 137 mmol/L (ref 135–145)

## 2023-03-27 LAB — MAGNESIUM: Magnesium: 1.8 mg/dL (ref 1.7–2.4)

## 2023-03-27 MED ORDER — ATORVASTATIN CALCIUM 40 MG PO TABS
40.0000 mg | ORAL_TABLET | Freq: Every day | ORAL | 0 refills | Status: AC
  Filled 2023-03-27: qty 30, 30d supply, fill #0

## 2023-03-27 MED ORDER — AMLODIPINE BESYLATE 10 MG PO TABS
10.0000 mg | ORAL_TABLET | Freq: Every day | ORAL | 0 refills | Status: AC
  Filled 2023-03-27: qty 30, 30d supply, fill #0

## 2023-03-27 MED ORDER — METFORMIN HCL ER 500 MG PO TB24
1000.0000 mg | ORAL_TABLET | Freq: Two times a day (BID) | ORAL | 0 refills | Status: AC
  Filled 2023-03-27: qty 120, 30d supply, fill #0

## 2023-03-27 MED ORDER — AMLODIPINE BESYLATE 10 MG PO TABS
10.0000 mg | ORAL_TABLET | Freq: Every day | ORAL | Status: DC
  Administered 2023-03-27: 10 mg via ORAL
  Filled 2023-03-27: qty 1

## 2023-03-27 MED ORDER — RYBELSUS 7 MG PO TABS
1.0000 | ORAL_TABLET | Freq: Every day | ORAL | 0 refills | Status: AC
  Filled 2023-03-27: qty 30, 30d supply, fill #0

## 2023-03-27 MED ORDER — INSULIN DEGLUDEC FLEXTOUCH 100 UNIT/ML ~~LOC~~ SOPN
26.0000 [IU] | PEN_INJECTOR | Freq: Every day | SUBCUTANEOUS | 0 refills | Status: AC
  Filled 2023-03-27: qty 9, 30d supply, fill #0

## 2023-03-27 MED ORDER — LOSARTAN POTASSIUM 100 MG PO TABS
100.0000 mg | ORAL_TABLET | Freq: Every day | ORAL | 0 refills | Status: AC
  Filled 2023-03-27: qty 30, 30d supply, fill #0

## 2023-03-27 MED ORDER — LEVOTHYROXINE SODIUM 150 MCG PO TABS
150.0000 ug | ORAL_TABLET | Freq: Every day | ORAL | 0 refills | Status: AC
  Filled 2023-03-27: qty 30, 30d supply, fill #0

## 2023-03-27 NOTE — Care Management Obs Status (Signed)
MEDICARE OBSERVATION STATUS NOTIFICATION   Patient Details  Name: [name] MRN: [id number] Date of Birth: 04/30/1966   Medicare Observation Status Notification Given:  Yes    Gala Lewandowsky, RN 03/27/2023, 10:57 AM

## 2023-03-27 NOTE — Discharge Instructions (Signed)
You were hospitalized for syncope, or passing out. Your episode at dinner was likely orthostatic hypotension. Your blood sugar was very high and caused dehydration and led to the orthostatic hypotension. We also discontinued metoprolol and jardiance to prevent further dizziness. We increased your insulin also to treat high blood sugar. I do not have a good explanation of the event in the car. We took a picture of the heart called an echocardiogram that was normal, you did not have a heart attack,and the rhythm of the heart was normal. Vertigo may have been the cause which you were treated for and will be discharged with vestibular rehab. At home:  1: STOP metoprolol daily 2. STOP Jardiance daily 3. Increase semglee to 26units nightly 4. Increase atorvastatin to 40mg  daily 5. Increase synthroid to daily 6. Follow up with the internal medicine center clinic ([facility]) 4/15 @ 3:45PM. Call [phone] to change the appointment.  It was a pleasure caring for you!

## 2023-03-27 NOTE — Progress Notes (Signed)
Told in handoff by nightshift that pt BP trending up, no BP meds on board and no new orders from night MD- BP this AM was 170/111 (129). MD made aware. Home amlodipine ordered for AM dose. Will continue to monitor.

## 2023-03-27 NOTE — Progress Notes (Addendum)
CSW received consult for patient. CSW discussed teams concerns of daughter mismanaging her meds.Patient is oriented x 4.Patient reports no concerns at this time in regards to Med management or financial concerns.Patient reports that she manages her funds on her own and that she has her own medication regimen. .Patient says she now manages by receiving meds in bubble pack. Patient plans to discharge to the [facility] in [place] when medically ready for dc. Patient reports she is number 24 on housing list. CSW offered patient shelter resources as well as Recruitment consultant for [employer]. Patient accepted.Patient reports she will transport by her car to motel when ready for dc. All questions answered. No further questions reported at this time.

## 2023-03-27 NOTE — TOC Initial Note (Signed)
Transition of Care [facility](TOC) - Initial/Assessment Note    Patient Details  Name: [name] MRN: [id number] Date of Birth: Apr 13, 1966  Transition of Care [facility](TOC) CM/SW Contact:    Gala LewandowskyGraves-Bigelow, Jamilex Bohnsack Kaye, RN Phone Number: 03/27/2023, 3:07 PM  Clinical Narrative:  Patient presented for syncopal episode. Patient states she has been homeless since the end of March, and that [facility]; she states she was with a friend around three days prior to admission. Patient is opting not to return home with the friend and wants to pay for a hotel: [facility] [address] [place], [place] [address]. Case Manager spoke with the patient regarding home health services- [facility]. [facility] and start of care to begin within 24-48 hours post transition home. Patient has her car to travel to the motel. DME RW ordered via [employer] and will be delivered to the room prior to transition home. No further needs identified at this time.                 Expected Discharge Plan: Home w Home Health Services Barriers to Discharge: No Barriers Identified   Patient Goals and CMS Choice Patient states their goals for this hospitalization and ongoing recovery are:: patient reports she will discharge to a motel.   Expected Discharge Plan and Services In-house Referral: Clinical Social Work Discharge Planning Services: CM Consult Post Acute Care Choice: Home Health Living arrangements for the past 2 months: Hotel/Motel (Homeless opting to pay for hotel.) Expected Discharge Date: 03/27/23               DME Arranged: Dan HumphreysWalker rolling DME Agency: [employer] Date DME Agency Contacted: 03/27/23 Time DME Agency Contacted: [phone] Representative spoke with at DME Agency: Belenda CruiseKristin HH Arranged: PT, OT [facility]: 03/27/23 Time HH Agency Contacted: [phone] [facility] Kandee Keenory  Prior  Living Arrangements/Services Living arrangements for the past 2 months: Hotel/Motel (Homeless opting to pay for hotel.) Lives with:: Self Patient language and need for interpreter reviewed:: Yes Do you feel safe going back to the place where you live?: Yes      Need for Family Participation in Patient Care: Yes (Comment) Care giver support system in place?: Yes (comment)   Criminal Activity/Legal Involvement Pertinent to Current Situation/Hospitalization: No - Comment as needed  Permission Sought/Granted Permission sought to share information with : Case Manager, Family Supports, Oceanographeracility Contact Representative Permission granted to share information with : Yes, Verbal Permission Granted     Permission granted to share info w AGENCY: [facility], [facility].    Emotional Assessment Appearance:: Appears stated age Attitude/Demeanor/Rapport: Engaged Affect (typically observed): Appropriate Orientation: : Oriented to Self, Oriented to Place, Oriented to  Time, Oriented to Situation Alcohol / Substance Use: Not Applicable Psych Involvement: No (comment)  Admission diagnosis:  Debility [R53.81] Syncope [R55] Hyperglycemia [R73.9] Patient Active Problem List   Diagnosis Date Noted   Orthostatic hypotension 03/27/2023   Syncope 03/26/2023   Orthopnea 03/26/2023   Type 2 diabetes mellitus with hyperglycemia, with long-term current use of insulin 03/26/2023   S/P total thyroidectomy 11/07/2022   Vertigo 09/26/2022   Left-sided weakness 09/26/2022   DM (diabetes mellitus), type 2 with complications 09/26/2022   Papillary thyroid carcinoma 09/26/2022   Acute metabolic encephalopathy 09/25/2022   Lumbar strain 08/13/2022   Lipoma of left upper extremity    Exercise  intolerance 12/26/2021   Heart murmur 12/26/2021   DOE (dyspnea on exertion) 12/26/2021   Palpitations 12/26/2021   Abdominal bloating    Polyp of colon    Multiple adenomatous polyps    Depression    Hyperlipidemia associated  with type 2 diabetes mellitus    GERD (gastroesophageal reflux disease)    Influenza A 01/26/2019   Hypokalemia 06/12/2017   Marijuana use 06/12/2017   Diabetic neuropathy 01/07/2016   Plantar fasciitis of left foot 11/26/2015   Back pain 10/10/2015   Asthma 09/29/2015   Essential hypertension 09/29/2015   DM (diabetes mellitus) type II controlled, neurological manifestation 09/29/2015   PCP:  Sherrie Mustache, MD Pharmacy:   [address] [place], [place] - [address] [place] [address] Phone: [phone] Fax: [phone]  [id number] - [address] [place] - [address] [place] [address] Phone: [phone] Fax: [phone]   [place] [facility] [address] [place] [address] Phone: [phone] Fax: [phone]  [facility] - [place], [place] - [address] [place] [address] Phone: [phone] Fax: [phone]  [facility] [address] [place] [address] Phone: [phone] Fax: [phone]  Social Determinants of Health (SDOH) Social History: SDOH Screenings   Food Insecurity: No Food Insecurity (11/07/2022)  Housing: Low Risk  (11/07/2022)  Transportation Needs: No Transportation Needs (11/07/2022)  Utilities: Not At Risk (11/07/2022)  Tobacco Use: Medium Risk (02/23/2023)    Readmission Risk Interventions     No data to display

## 2023-03-27 NOTE — Discharge Summary (Addendum)
Name: [name] MRN: [id number] DOB: [birth year] 57 y.o. PCP: Sherrie MustacheJadali, Fayegh, MD  Date of Admission: 03/25/2023  3:41 PM Date of Discharge: 03/27/2023 1:50PM Attending Physician: Mercie EonMachen, Julie, MD  Discharge Diagnosis: 1. Principal Problem:   Syncope Active Problems:   Essential hypertension   Diabetic neuropathy   Hypokalemia   Hyperlipidemia associated with type 2 diabetes mellitus   Palpitations   Papillary thyroid carcinoma   Orthopnea   Type 2 diabetes mellitus with hyperglycemia, with long-term current use of insulin   Orthostatic hypotension   Discharge Medications: Allergies as of 03/27/2023   No Known Allergies      Medication List     STOP taking these medications    ibuprofen 800 MG tablet Commonly known as: ADVIL   insulin glargine-yfgn 100 UNIT/ML Pen Commonly known as: SEMGLEE   Insulin Pen Needle 30G X 8 MM Misc Commonly known as: NOVOFINE   Jardiance 10 MG Tabs tablet Generic drug: empagliflozin   metFORMIN 1000 MG tablet Commonly known as: Glucophage Replaced by: metFORMIN 500 MG 24 hr tablet   metoprolol succinate 50 MG 24 hr tablet Commonly known as: TOPROL-XL   Semglee (yfgn) 100 UNIT/ML Pen Generic drug: insulin glargine-yfgn   TechLite Pen Needles 31G X 5 MM Misc Generic drug: Insulin Pen Needle       TAKE these medications    albuterol (2.5 MG/3ML) 0.083% nebulizer solution Commonly known as: PROVENTIL Take 3 mLs (2.5 mg total) by nebulization every 2 (two) hours as needed for wheezing or shortness of breath.   albuterol 108 (90 Base) MCG/ACT inhaler Commonly known as: VENTOLIN HFA Inhale 2 puffs into the lungs every 6 (six) hours as needed for wheezing or shortness of breath.   amLODipine 10 MG tablet Commonly known as: NORVASC Take 1 tablet (10 mg total) by mouth daily.   atorvastatin 40 MG tablet Commonly known as: LIPITOR Take 1 tablet (40 mg total) by mouth daily. Start taking on: March 28, 2023 What  changed:  medication strength how much to take   budesonide-formoterol 160-4.5 MCG/ACT inhaler Commonly known as: Symbicort Inhale 2 puffs into the lungs 2 (two) times daily.   Clobetasol Prop Emollient Base 0.05 % emollient cream Commonly known as: Clobetasol Propionate E Apply 1 Application topically at bedtime.   diclofenac sodium 1 % Gel Commonly known as: VOLTAREN Apply 2 g topically 4 (four) times daily as needed (pain).   gabapentin 300 MG capsule Commonly known as: Neurontin Take 1 capsule (300 mg total) by mouth 2 (two) times daily for 15 days.   Insulin Degludec FlexTouch 100 UNIT/ML Sopn Inject 26 Units into the skin daily. What changed: how much to take   levothyroxine 150 MCG tablet Commonly known as: SYNTHROID Take 1 tablet (150 mcg total) by mouth daily at 6 (six) AM. Start taking on: March 28, 2023 What changed:  medication strength how much to take when to take this Another medication with the same name was removed. Continue taking this medication, and follow the directions you see here.   loratadine 10 MG tablet Commonly known as: CLARITIN Take 1 tablet (10 mg total) by mouth daily.   losartan 100 MG tablet Commonly known as: Cozaar Take 1 tablet (100 mg total) by mouth daily.   meclizine 25 MG tablet Commonly known as: ANTIVERT Take 1 tablet (25 mg total) by mouth 3 (three) times daily as needed for dizziness.   metFORMIN 500 MG 24 hr tablet Commonly known as: GLUCOPHAGE-XR Take 2  tablets (1,000 mg total) by mouth 2 (two) times daily with a meal. Replaces: metFORMIN 1000 MG tablet   methocarbamol 500 MG tablet Commonly known as: ROBAXIN TAKE 1 TABLET BY MOUTH EVERY 6 HOURS AS NEEDED FOR MUSCLE SPASMS   omeprazole 40 MG capsule Commonly known as: PRILOSEC Take 1 capsule (40 mg total) by mouth daily.   Rybelsus 7 MG Tabs Generic drug: Semaglutide Take 1 tablet (7 mg total) by mouth daily. What changed: how much to take   sertraline 50 MG  tablet Commonly known as: ZOLOFT Take 1.5 tablets (75 mg total) by mouth every morning. What changed: when to take this   valACYclovir 1000 MG tablet Commonly known as: VALTREX Take 1 tablet (1,000 mg total) by mouth 3 (three) times daily.   Vitamin D3 1.25 MG (50000 UT) capsule Generic drug: Cholecalciferol Take 1 capsule by mouth once a week. wednesday               Durable Medical Equipment  (From admission, onward)           Start     Ordered   03/27/23 0000  For home use only DME 4 wheeled rolling walker with seat       Question:  Patient needs a walker to treat with the following condition  Answer:  Orthostatic hypotension   03/27/23 1455            Disposition and follow-up:   [name] was discharged from [facility] in Stable condition.  At the hospital follow up visit please address:  1.    ENSURE HAS MED REFILLS  AKI -f/u BMP  T2DM -stopped jardiance -increased semglee to 26u nightly, titrate as needed  Syncope -ensure she gets [facility] PT -ensure follow up of zio patch  Vertigo -ensure she gets vestibular PT  HTN -discontinued metoprolol and jardiance in the setting of syncope -restarted losartan at discharge, f/u bmp  Housing instability -social work referral needed  2.  Labs / imaging needed at time of follow-up: BMP  3.  Pending labs/ test needing follow-up: NA  Follow-up Appointments:  04/06/2023 3:45 PM Adron Bene, MD [facility] Course by problem list: [name] is a 57 y/o female with past medical history of HTN, HLD, T2DM w/ neuropathy, diastolic heart dysfunction, COPD, papillary thyroid carcinoma s/p thyroidectomy, anemia, GERD, diverticulitis, arthritis, chronic back pain, depression, migraine, chronic vertigo that presents after a syncopal episode and was admitted for further evaluation of syncope.    #Syncope #Palpitations Patient has been  experiencing palpitations over the last several months.  Currently followed by cardiologist Dr. Herbie Baltimore as outpatient with most recent visit in 08/2022. Noted to have frequent PACs on prior long term monitoring placed by [facility].Has loop recorder in place for palpitations per patient placed on March 30 with Dr. Mercy Riding at [facility].  She presented after syncopal episode involving prodrome of lightheadedness, palpitations, and chest pain. HDS on admission.  No fever or leukocytosis. CXR negative for acute abnormalities. Orthostatic vitals negative, although suspect glc to >500 on admission with AKI led to orthostatic hypotension.  EKG was significant for abnormal R wave progression, LVH, prolonged QT, T wave inversion in lead 2/3/aVF.  T wave inversions were present on prior EKGs. Troponins were wnl. Noted to have hypokalemia of 2.7 on admission with QT prolongation,Qtc 449 at discharge and wnl with repeat EKG showing scarce sinus pauses. Patient reports CP and palpitations led to  dizziness and black out of vision while driving, different from the orthostatic episode at dinner and vertigo. This was more concerning for cardiac cause. Perhaps an arrhythmia in the setting of hypokalemia occurred. Overall no evidence of ischemia per troponins and unchanged EKG. Continued to have palpitations while on tele although no abnormal rhythms during admission on tele. Echo unchanged from prior with no structural reason for syncope or valvular cause. Will follow up with [facility] for zio patch review. Continued to have similar black out of vision an vertiginous symptoms lasting 15-30 seconds with positional change during admission. Overall no evidence of cardiac cause. Prior CTA head and neck with no stenosis or flow limiting lesions 09/2022. Given correction of potassium, resolution of prolonged QT, echo wnl, no arrhythmia on tele, zio patch follow up, treatment of provoking factors including hyperglycemia and dehydration,  discontinuation of provoking meds including metoprolol and jardiance, vestibular PT during admission with [facility] PT and vestibular rehab at discharge, she was medically appropriate for discharge without further workup.  #Chronic Vertigo MRI brain from hospitalization in 09/2022 was negative for acute findings.  Patient was discharged on meclizine 25 mg daily.  Uncertain if vertigo is contributing to her recurrent falls at home at this time.  Continued home meclizine. Received vestibular PT during admission. Discharged with vestibular rehab.   #T2DM w/ neuropathy Patient has longstanding poorly controlled diabetes.  A1c from 6 months ago was 10.7%.  Currently taking metformin 1000 mg BID, Jardiance 10 mg daily, semaglutide 7 mg daily, and insulin degludec 20 units daily.  Also taking gabapentin 300 mg BID for neuropathy.  On admission, glucose elevated at 584 w/ AG of 18. VBG without signs of acidosis.  UA without ketones.  Low suspicion for DKA at this time. Held orals during admission, restarted at discharge. Semlgee increased to 26 units with next day fasting glucose 166, discharged on this dose. Also on SSI during admission.   #Hx of Papillary Thyroid Carcinoma s/p thyroidectomy Ultrasound from 06/2022 demonstrated 2.1 cm right inferior thyroid nodule.  FNA was suspicious for malignancy.  CT chest/abdomen/pelvis in 06/2022 was negative for other sites of malignancy with exception of 2.8 cm left adrenal mass that was stable from prior imaging. Underwent total thyroidectomy on 11/07/2022 by Dr. Elenore Rota. Pathology demonstrated papillary thyroid carcinoma.  Follows with oncologist Dr. Smith Robert as outpatient. Underwent radioactive iodine treatment.  Whole-body iodine-131 scan demonstrated focus of asymmetric uptake in the right cervical region that could be physiologic vs metastatic disease.  Patient was started on levothyroxine 125 mcg by PCP.  On admission, TSH elevated at 44 and free T4 WNL. Increased Levothyroxine  to 150 mcg.  #Elevated Creatinine  BMP elevated to 1.55 on admission.Creatinine two months ago was 1.47 in the setting of hyperglycemia and five months ago was approximately 0.8.  At discharge creatinine 1.20.  Suspect acute rise in creatinine may be secondary to osmotic diuresis in the setting of hyperglycemia.  #Hypokalemia Potassium to 2.7 on admission in the setting of hyperglycemia and osmotic diuresis. Found to be 4.1 at discharge, will follow up outpatient.  #HTN Given recent syncopal episodes, held home BP meds on admission. Restarted amlodipine given systolics to 170s. Restarted losartan at discharge. Discontinued metoprolol in the setting of syncope.   #HLD Patient taking atorvastatin 10 mg daily at home.  Repeat lipid panel on admission WNL with exception of HDL 35 and LDL 83.  ASCVD risk is 15%. Patient should be on high intensity statin to reduce risk of stroke/MI. Started  atorvastatin 40 mg daily and continued at discharge.   #Hx of Migraine #LUE numbness #LUE weakness Presents w/ left upper extremity numbness/weakness that is chronic but recently worsened. Also reports that she continues to have migraines.  She has had extensive workup for this numbness and history of migraines. Of note, patient was seen by [facility] neurology in 08/2020. Had transient left hemibody numbness/tingling, left eye visual disturbance, headache, and photophobia. 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 Plan was to start [place] and ubrogepant.  It was noted that patient was not a good candidate for sumatriptan given history of hypertension/uncontrolled diabetes. Vernell Barrier was discontinued in 7/02022 and patient was to continue taking ubrogepant 100 mg daily. Patient is not currently taking ubrogepant, uncertain when this medication was stopped or if the patient stopped taking this medication. She was discharged on 09/30/2022 after being hospitalized for altered mental status and  left-sided weakness.  CT head, CTA head/neck, and MRI brain were negative for acute abnormalities. Also reported migraines during this hospitalization. Her symptoms improved with sumatriptan and Flexeril while hospitalized.  Patient was discharged on sumatriptan with plan to follow-up with neurology. Patient does not appear to be taking sumatriptan at this time but continues to have migraines. MRI from 02/2023 negative for acute intracranial abnormality, no LVO or proximal hemodynamically significant stenosis. CN exama nd neuro exam unremarkable during admission. B12 wnl. Do not think symptoms were contributory to syncope, these did not need further work up and are chronic and requiring outpatient follow up.  #Hx of Diastolic heart dysfunction #Bilateral LE edema  Patient presented with chronic orthopnea and recent lower extremity swelling. Echo from 12/2021 w/ EF 60-65%, LVH, and G1DD. Repeat echo during admission unchanged. On exam, had moderate lower extremity edema up to the ankles bilaterally . No JVD and no crackles on lung exam.  CXR without signs of volume overload. BNP wnl. No evidence of exacerbation.  #Hx of Vitamin D deficiency Vitamin D level was low 3 years ago at 25.  Patient is taking vitamin Vitamin D3 1.25 mg weekly at homeVitamin D during admission 32.68, can continue supplement.Marland Kitchen   #COPD Continued Albuterol nebs PRN and Dulera inhaler.   #Arthritis #Chronic back pain Continued home methocarbamol  .   #Depression Continued home sertraline.   #GERD Held home omeprazole, restarted at discharge.  Subjective: She walked today. She felt dizzy. She is having about 3 dizzy episodes. Only happens when she is changing positions. Lasts inly 2 seconds. After equilibrium is reached she is okay. Walking with walkernis good. Feels same chest pain at her loop insertion site. Feeling a funny heartbeat at time. Got dizzy when we sat her up.   Discharge Exam:   BP (!) 153/109   Pulse 90    Temp (!) 97.2 F (36.2 C) (Axillary)   Resp 18   Wt 69.3 kg   SpO2 100%   BMI 31.94 kg/m  Discharge exam:  Constitutional: Well nourished, well developed, appears comfortable  HENT: Normocephalic and atraumatic.  Eyes:  Exophthalmos bilaterally. Ptosis bilaterally.  EOMI, fatigueable horizontal end gaze nystagmus. Cardiovascular: Regular rate, regular rhythm. No murmurs, rubs, or gallops. 2+bilateral radial pulses. No JVD. Pulmonary: Normal respiratory effort on RA. No wheezes, rales, rhonchi, or crackles.   Musculoskeletal: Loop recorder on left chest.    Skin: warm and dry, minimal edema to the ankles bilaterally non-pitting  Pertinent Labs, Studies, and Procedures:     Latest Ref Rng & Units 03/25/2023    7:47 PM 03/25/2023  4:23 PM 01/09/2023   10:30 AM  CBC  WBC 4.0 - 10.5 K/uL  6.2  4.3   Hemoglobin 12.0 - 15.0 g/dL 16.1  09.6  04.5   Hematocrit 36.0 - 46.0 % 43.0  41.4  38.4   Platelets 150 - 400 K/uL  253  225        Latest Ref Rng & Units 03/27/2023    1:43 AM 03/26/2023   12:13 AM 03/25/2023    7:47 PM  BMP  Glucose 70 - 99 mg/dL 409  811    BUN 6 - 20 mg/dL 18  18    Creatinine 9.14 - 1.00 mg/dL 7.82  9.56    Sodium 213 - 145 mmol/L 137  134  133   Potassium 3.5 - 5.1 mmol/L 4.2  3.4  4.1   Chloride 98 - 111 mmol/L 105  102    CO2 22 - 32 mmol/L 25  20    Calcium 8.9 - 10.3 mg/dL 9.2  9.1     ECHOCARDIOGRAM COMPLETE  Result Date: 03/27/2023    ECHOCARDIOGRAM REPORT   Patient Name:   [name] Date of Exam: 03/26/2023 Medical Rec #:  [id number]       Height:       58.0 in Accession #:    [id number]      Weight:       150.0 lb Date of Birth:  1966-04-08      BSA:          1.612 m Patient Age:    56 years        BP:           144/93 mmHg Patient Gender: F               HR:           60 bpm. Exam Location:  Inpatient Procedure: 2D Echo, Cardiac Doppler and Color Doppler Indications:    Dyspnea R06.00  History:        Patient has prior history of Echocardiogram  examinations, most                 recent 01/09/2022. Signs/Symptoms:Dyspnea and Syncope; Risk                 Factors:Hypertension, Diabetes and Dyslipidemia.  Sonographer:    Lucendia Herrlich Referring Phys: [id number] JULIE MACHEN IMPRESSIONS  1. Left ventricular ejection fraction, by estimation, is 65 to 70%. The left ventricle has normal function. The left ventricle has no regional wall motion abnormalities. There is mild concentric left ventricular hypertrophy. Left ventricular diastolic parameters are consistent with Grade I diastolic dysfunction (impaired relaxation).  2. Right ventricular systolic function is normal. The right ventricular size is normal. There is normal pulmonary artery systolic pressure.  3. The mitral valve is normal in structure. No evidence of mitral valve regurgitation. No evidence of mitral stenosis.  4. The aortic valve is normal in structure. Aortic valve regurgitation is mild. No aortic stenosis is present.  5. The inferior vena cava is normal in size with greater than 50% respiratory variability, suggesting right atrial pressure of 3 mmHg. FINDINGS  Left Ventricle: Left ventricular ejection fraction, by estimation, is 65 to 70%. The left ventricle has normal function. The left ventricle has no regional wall motion abnormalities. The left ventricular internal cavity size was normal in size. There is  mild concentric left ventricular hypertrophy. Left ventricular diastolic parameters are consistent with Grade I diastolic dysfunction (impaired  relaxation). Indeterminate filling pressures. Right Ventricle: The right ventricular size is normal. No increase in right ventricular wall thickness. Right ventricular systolic function is normal. There is normal pulmonary artery systolic pressure. The tricuspid regurgitant velocity is 1.97 m/s, and  with an assumed right atrial pressure of 3 mmHg, the estimated right ventricular systolic pressure is 18.5 mmHg. Left Atrium: Left atrial size was  normal in size. Right Atrium: Right atrial size was normal in size. Pericardium: There is no evidence of pericardial effusion. Mitral Valve: The mitral valve is normal in structure. No evidence of mitral valve regurgitation. No evidence of mitral valve stenosis. Tricuspid Valve: The tricuspid valve is normal in structure. Tricuspid valve regurgitation is trivial. No evidence of tricuspid stenosis. Aortic Valve: The aortic valve is normal in structure. Aortic valve regurgitation is mild. No aortic stenosis is present. Aortic valve mean gradient measures 5.0 mmHg. Aortic valve peak gradient measures 8.8 mmHg. Aortic valve area, by VTI measures 1.87 cm. Pulmonic Valve: The pulmonic valve was normal in structure. Pulmonic valve regurgitation is not visualized. No evidence of pulmonic stenosis. Aorta: The aortic root is normal in size and structure. Venous: The inferior vena cava is normal in size with greater than 50% respiratory variability, suggesting right atrial pressure of 3 mmHg. IAS/Shunts: No atrial level shunt detected by color flow Doppler.  LEFT VENTRICLE PLAX 2D LVIDd:         4.60 cm   Diastology LVIDs:         2.60 cm   LV e' medial:    6.00 cm/s LV PW:         1.30 cm   LV E/e' medial:  9.6 LV IVS:        1.32 cm   LV e' lateral:   4.70 cm/s LVOT diam:     2.10 cm   LV E/e' lateral: 12.3 LV SV:         58 LV SV Index:   36 LVOT Area:     3.46 cm  RIGHT VENTRICLE             IVC RV S prime:     13.80 cm/s  IVC diam: 1.50 cm TAPSE (M-mode): 2.1 cm LEFT ATRIUM             Index        RIGHT ATRIUM           Index LA diam:        3.60 cm 2.23 cm/m   RA Area:     11.70 cm LA Vol (A2C):   19.1 ml 11.85 ml/m  RA Volume:   28.50 ml  17.69 ml/m LA Vol (A4C):   31.3 ml 19.42 ml/m LA Biplane Vol: 25.1 ml 15.58 ml/m  AORTIC VALVE AV Area (Vmax):    2.29 cm AV Area (Vmean):   2.11 cm AV Area (VTI):     1.87 cm AV Vmax:           148.00 cm/s AV Vmean:          98.900 cm/s AV VTI:            0.309 m AV Peak  Grad:      8.8 mmHg AV Mean Grad:      5.0 mmHg LVOT Vmax:         97.73 cm/s LVOT Vmean:        60.133 cm/s LVOT VTI:          0.167 m  LVOT/AV VTI ratio: 0.54  AORTA Ao Root diam: 3.10 cm Ao Asc diam:  3.60 cm MITRAL VALVE               TRICUSPID VALVE MV Area (PHT): 3.31 cm    TR Peak grad:   15.5 mmHg MV Decel Time: 229 msec    TR Vmax:        197.00 cm/s MV E velocity: 57.90 cm/s MV A velocity: 86.70 cm/s  SHUNTS MV E/A ratio:  0.67        Systemic VTI:  0.17 m                            Systemic Diam: 2.10 cm Chilton Siiffany Allport MD Electronically signed by Chilton Siiffany Dunseith MD Signature Date/Time: 03/27/2023/5:59:01 AM    Final    DG Abd 1 View  Result Date: 03/26/2023 CLINICAL DATA:  [id number] with abdominal pain lower extremity swelling. EXAM: ABDOMEN - 1 VIEW COMPARISON:  CT abdomen and pelvis 09/25/2022 FINDINGS: The bowel gas pattern is nonobstructive, with moderate retained stool in the ascending and transverse colon. No radio-opaque calculi or other significant radiographic abnormality are seen. IMPRESSION: Nonobstructive bowel gas pattern with moderate retained stool in the ascending and transverse colon. Electronically Signed   By: Almira BarKeith  Chesser M.D.   On: 03/26/2023 00:42   DG Chest Port 1 View  Result Date: 03/25/2023 CLINICAL DATA:  Weakness. EXAM: PORTABLE CHEST 1 VIEW COMPARISON:  Chest radiograph dated 09/25/2022. FINDINGS: Minimal bibasilar atelectasis. No focal consolidation, pleural effusion, or pneumothorax. The cardiac silhouette is within limits. The aorta is tortuous. No acute osseous pathology. IMPRESSION: No active disease. Electronically Signed   By: Elgie CollardArash  Radparvar M.D.   On: 03/25/2023 19:39     Discharge Instructions: Discharge Instructions     Diet - low sodium heart healthy   Complete by: As directed    For home use only DME 4 wheeled rolling walker with seat   Complete by: As directed    Patient needs a walker to treat with the following condition: Orthostatic hypotension    Increase activity slowly   Complete by: As directed       You were hospitalized for syncope, or passing out. Your episode at dinner was likely orthostatic hypotension. Your blood sugar was very high and caused dehydration and led to the orthostatic hypotension. We also discontinued metoprolol and jardiance to prevent further dizziness. We increased your insulin also to treat high blood sugar. I do not have a good explanation of the event in the car. We took a picture of the heart called an echocardiogram that was normal, you did not have a heart attack,and the rhythm of the heart was normal. Vertigo may have been the cause which you were treated for and will be discharged with vestibular rehab. At home:  1: STOP metoprolol daily 2. STOP Jardiance daily 3. Increase semglee to 26units nightly 4. Increase atorvastatin to 40mg  daily 5. Increase synthroid to 150mcg daily 6. Follow up with the internal medicine center clinic ([facility] 4/15 @ 3:45PM. Call [phone] to change the appointment.  It was a pleasure caring for you!  Signed: Willette Clusterogers, Ewald Beg, MD 03/27/2023, 2:55 PM   Pager: Willette Clusteryler Rage Beever, MD Internal Medicine Resident, PGY-1 [facility]  Pager: [phone]

## 2023-03-27 NOTE — TOC Transition Note (Signed)
Discharge medications (6) are being stored in the main pharmacy on the ground floor until patient is ready for discharge.   

## 2023-03-27 NOTE — Progress Notes (Signed)
Mobility Specialist Progress Note:   03/27/23 1300  Orthostatic Lying   BP- Lying (!) 149/103 (MAP117)  Pulse- Lying 93  Orthostatic Sitting  BP- Sitting (!) 160/105 (MAP 121)  Pulse- Sitting 97  Orthostatic Standing at 0 minutes  BP- Standing at 0 minutes (!) 153/119 (MAP 126)  Pulse- Standing at 0 minutes 110  Orthostatic Standing at 3 minutes  BP- Standing at 3 minutes (!) 149/102 (MAP 126)  Pulse- Standing at 3 minutes 111  Mobility  Activity Stood at bedside  Level of Assistance Contact guard assist, steadying assist  Assistive Device Other (Comment) (HHA)  Activity Response Tolerated well  Mobility Referral Yes  $Mobility charge 1 Mobility   RN requested orthostatic Bps. Pt with dizziness upon any change in position. Reported results to RN.   Addison Lank Mobility Specialist Please contact via SecureChat or  Rehab office at [phone]

## 2023-03-27 NOTE — Progress Notes (Signed)
Physical Therapy Vestibular Evaluation Patient Details Name: [name] MRN: [id number] DOB: 1966/11/08 Today's Date: 03/27/2023   History of Present Illness 57 y/o female that presents 4/3 after a syncopal episode. Workup has not determined cause at this time possible, hypotension.  Past medical history of HTN, HLD, T2DM w/ neuropathy, diastolic heart dysfunction, COPD, papillary thyroid carcinoma s/p thyroidectomy, anemia, GERD, diverticulitis, arthritis, chronic back pain, depression, migraine, chronic vertigo.    PT Comments    Pt is presenting at Mod I. She is reporting dizziness with going from supine<>sitting that lasts for short bursts. Pt is presenting with most likely L posterior canal BPPV and possible L horizontal canal. Pt had very slow response with each head position with continuous bursts of dizziness within 30 seconds for 5 min or longer and then bursts of vertigo in sitting following treatment. Pt will most likely need follow secondary to possible thickened endolymph resulting in slow moving otoconia. Pt will also require horizontal canal to be checked once L posterior canal has time to settle. Pt was educated on post Epley instructions and stated understanding. Due to pt current functional status recommending skilled HHPT with a vestibular therapist in order to help pt with balance, strength and functional mobility in order to decrease risk for falls, injury and re-hospitalization.     Recommendations for follow up therapy are one component of a multi-disciplinary discharge planning process, led by the attending physician.  Recommendations may be updated based on patient status, additional functional criteria and insurance authorization.  Follow Up Recommendations       Assistance Recommended at Discharge Intermittent Supervision/Assistance  Patient can return home with the following Assistance with cooking/housework;Assist for transportation   Equipment Recommendations   Rolling walker (2 wheels)    Recommendations for Other Services       Precautions / Restrictions Precautions Precautions: Fall Precaution Comments: monitor vitals Restrictions Weight Bearing Restrictions: No     Mobility  Bed Mobility Overal bed mobility: Modified Independent Bed Mobility: Supine to Sit, Sit to Supine     Supine to sit: Modified independent (Device/Increase time) Sit to supine: Modified independent (Device/Increase time)   General bed mobility comments: No difficulty with bed mobility Patient Response: Cooperative  Transfers Overall transfer level: Modified independent Equipment used: Rolling walker (2 wheels) Transfers: Sit to/from Stand Sit to Stand: Modified independent (Device/Increase time)           General transfer comment: Pt did well with hand placement no LOB with standing.    Ambulation/Gait Ambulation/Gait assistance: Modified independent (Device/Increase time) Gait Distance (Feet): 60 Feet Assistive device: Rolling walker (2 wheels) Gait Pattern/deviations: Step-to pattern, Decreased stride length Gait velocity: reduced Gait velocity interpretation: <1.31 ft/sec, indicative of household ambulator   General Gait Details: Pt has slightly decreased cadence. Stopped one time due to very limited dizziness and then continued ambulating. Pt was able to make 3 turns without any dizziness. Slightly decreased stride length.   Stairs  Pt states she does not have stairs.       Balance Overall balance assessment: Modified Independent   Sitting balance-Leahy Scale: Good Sitting balance - Comments: no overt LOB pt did get dizzy with sitting   Standing balance support: Bilateral upper extremity supported Standing balance-Leahy Scale: Fair Standing balance comment: With UE support pt has no overt LOB.      Cognition Arousal/Alertness: Awake/alert Behavior During Therapy: WFL for tasks assessed/performed Overall Cognitive Status: Within  Functional Limits for tasks assessed  General Comments General comments (skin integrity, edema, etc.): Vestibular system addressed today. Pt was positive for horizontal VOR with L>R, OTR with L >R ,  Negative for gaze stabilization, spontaneous nystagmus, convergence and vertical VOR. Pt was positive with very minimal nystagmus for L Dix-hallpike with slow processing and movement of otoconia with intermittent brief episodes as otoconia settled in each position of the Epley maneuver for the L posterior canal. Pt was advised to sleep up at a 45 degree angle if possible and avoid sleeping on the L ear. Pt will benefit from vestibular therapist on discharge in order to address L horizontal canal as well and follow up of the L posterior canal.      Pertinent Vitals/Pain Pain Assessment Pain Assessment: No/denies pain     PT Goals (current goals can now be found in the care plan section) Acute Rehab PT Goals Patient Stated Goal: to walk and get strength back; and to improve dizziness. PT Goal Formulation: With patient Time For Goal Achievement: 04/02/23 Potential to Achieve Goals: Good Progress towards PT goals: Progressing toward goals    Frequency    Min 3X/week      PT Plan Current plan remains appropriate       AM-PAC PT "6 Clicks" Mobility   Outcome Measure  Help needed turning from your back to your side while in a flat bed without using bedrails?: None Help needed moving from lying on your back to sitting on the side of a flat bed without using bedrails?: None Help needed moving to and from a bed to a chair (including a wheelchair)?: None Help needed standing up from a chair using your arms (e.g., wheelchair or bedside chair)?: None Help needed to walk in hospital room?: None Help needed climbing 3-5 steps with a railing? : A Little 6 Click Score: 23    End of Session Equipment Utilized During Treatment: Gait belt Activity Tolerance: Patient tolerated  treatment well Patient left: in bed;with call bell/phone within reach;with bed alarm set Nurse Communication: Mobility status PT Visit Diagnosis: Unsteadiness on feet (R26.81);Muscle weakness (generalized) (M62.81);Difficulty in walking, not elsewhere classified (R26.2)     Time: 1856-3149 PT Time Calculation (min) (ACUTE ONLY): 62 min  Charges:  $Therapeutic Activity: 53-67 mins                     Harrel Carina, DPT, CLT  Acute Rehabilitation Services Office: [phone] (Secure chat preferred)    Claudia Desanctis 03/27/2023, 3:10 PM

## 2023-03-27 NOTE — Telephone Encounter (Signed)
Patient Advocate Encounter   Received notification that prior authorization for Rybelsus 7MG  tablets is required.   PA submitted on 03/27/2023 [employer] Electronic PA Form Status is pending       Roland Earl, CPhT Pharmacy Patient Advocate Specialist [facility] Patient Advocate Team Direct Number: [phone]  Fax: [phone]

## 2023-03-27 NOTE — Progress Notes (Signed)
Pt's BP has been trending up, @ 0041 BP was 164/111 MAP 127, HR 79. Mapp, Night Intern was notified via text page. No new orders. Will continue to monitor.  Bari Edward, RN

## 2023-03-27 NOTE — Telephone Encounter (Signed)
Patient Advocate Encounter  Received notification that the request for prior authorization for Rybelsus 7MG  tablets  has been denied due to must try [place] and [place] first.    Roland Earl, CPhT Pharmacy Patient Advocate Specialist [facility] Patient Advocate Team Direct Number: [phone]  Fax: [phone]

## 2023-03-30 ENCOUNTER — Encounter: Payer: Self-pay | Admitting: Student

## 2023-04-03 ENCOUNTER — Ambulatory Visit
Admission: RE | Admit: 2023-04-03 | Discharge: 2023-04-03 | Disposition: A | Discharge status: Home / Self Care | Payer: Medicaid Other | Source: Ambulatory Visit | Attending: Student

## 2023-04-03 DIAGNOSIS — M25512 Pain in left shoulder: Secondary | ICD-10-CM

## 2023-04-03 DIAGNOSIS — S46012A Strain of muscle(s) and tendon(s) of the rotator cuff of left shoulder, initial encounter: Secondary | ICD-10-CM | POA: Diagnosis not present

## 2023-04-14 ENCOUNTER — Encounter: Payer: Medicare HMO | Admitting: Student

## 2023-04-17 ENCOUNTER — Ambulatory Visit: Payer: Medicare HMO | Admitting: Adult Health

## 2023-04-20 DIAGNOSIS — Z7189 Other specified counseling: Secondary | ICD-10-CM | POA: Diagnosis not present

## 2023-04-20 DIAGNOSIS — Z794 Long term (current) use of insulin: Secondary | ICD-10-CM | POA: Diagnosis not present

## 2023-04-20 DIAGNOSIS — Z79899 Other long term (current) drug therapy: Secondary | ICD-10-CM | POA: Diagnosis not present

## 2023-04-20 DIAGNOSIS — I119 Hypertensive heart disease without heart failure: Secondary | ICD-10-CM | POA: Diagnosis not present

## 2023-04-20 DIAGNOSIS — R55 Syncope and collapse: Secondary | ICD-10-CM | POA: Diagnosis not present

## 2023-04-20 DIAGNOSIS — E1165 Type 2 diabetes mellitus with hyperglycemia: Secondary | ICD-10-CM | POA: Diagnosis not present

## 2023-04-20 DIAGNOSIS — R42 Dizziness and giddiness: Secondary | ICD-10-CM | POA: Diagnosis not present

## 2023-04-22 NOTE — Progress Notes (Deleted)
Cardiology Office Note:    Date:  04/22/2023   ID:  ZAALIYAH OTTUM, DOB 1966/12/19, MRN [id number]  PCP:  Sherrie Mustache, MD   [facility] Providers Cardiologist:  Bryan Lemma, MD {   Referring MD: Sherrie Mustache, MD   No chief complaint on file. ***  History of Present Illness:    [name] is a 57 y.o. female with a hx of chest pain, dyspnea, palpitations, HTN, diabetes, depression, COPD, diverticulitis, chronic back pain, papillary thyroid carcinoma s/p thyroidectomy.   Per chart review, patient was referred to Dr. Herbie Baltimore in 12/2021 for evaluation of SOB, fatigue, and chest pain. Coronary CTA from 01/02/22 showed coronary calcium score of 1.29, minimal proximal LAD calcification. Overall, study showed minimal non-obstructive CAD. Echocardiogram from 01/09/22 showed EF 60-65%, no regional wall motion abnormalities, mild-moderate LVH, grade I DD, normal RV systolic function, mild mitral valve regurgitation. Long term monitor showed predominantly sinus rhythm with frequent PACs (5.3%), 5 short bursts of PAT/atrial tachycardia. Symptoms correlated with PACs and short PAT runs. She was treated with lopressor 100 mg in the AM and 50 mg in the PM, atorvastatin. She was seen in the ED in 07/2022 after she chocked on her pills. EMS found her unresponsive, but she regained consciousness quickly with a sternal rub.   Patient was lat seen by cardiology on 08/28/31. At that time, her BP was elevated. Losartan was increased, and she remained on lopressor, ASA, lipitor. She was recently admitted to the hospital from 4/2-03/27/23 when she presented after a syncopal episode. Prior to syncope, patient had a prodrome of lightheadedness, palpitations, and chest pain. Troponins negative in the ED. Blood glucose >500, K 2.7, creatinine 1.55 on admission. Initial EKG showed a prolonged QT, and QT improved with K supplementation. There was concern for dehydration and orthostatic hypotension, and patient  was treated with IV fluids. Patient underwent echocardiogram on 03/26/23 that showed EF 65-70%, no regional wall motion abnormalities, mild LVH, grade 1 DD, normal RV systolic function, mild aortic valve regurgitation. Patient also complained of palpitations. She had hypotension during her admission, and metoprolol was stopped at discharge. Patient was instructed to follow up with her cardiologist at [facility] for zio-patch.   Palpitations  Frequent PACs  - Long term monitor in 12/2021 showed NSR with frequent PACs, 5 short bursts of PAT/atrial tachycardia  - Patient was previously treated with metoprolol. However, this was discontinued during a recent hospitalization due to low BP  - BP today in clinic *** - Resume metoprolol tartrate at 25 mg BID.   Recent Syncopal Episode  - Patient was admitted from 4/2- 4/5 after a syncopal episode. Did have a prodrome of lightheadedness, palpitations, and chest pain prior to syncope. In the ED, hsTn negative, glucose >500, creatinine 1.55, and K 2.7. There was concern for dehydration and orthostatic hypotension, and patient was treated with IV fluids  - Echocardiogram showed EF 65-70%, no regional WMAs, mild LVH, grade 1 DD - Ordered Zio*** - Since being discharged from the hospital, she has not had recurrent syncopal events *** - BP in clinic today ***  HTN  -   HLD  - Lipid panel from 03/2023 showed LDL 83, HDL 35, triglycerides 72, total cholesterol 132  - Increase lipitor to 80 mg daily  - Ordered LFTs and lipid panel in 8 weeks   Type 2 DM  - A1c from 03/26/23 was 10.7  - During her recent admission in 03/2023, her initial glucose was 584 -  She is currently on metformin, insulin  - Managed by PCP   History of papillary thyroid carcinoma s/p thyroidectomy  - Patient underwent total thyroidectomy by Dr. Elenore Rota in 10/2022  - Followed with oncology as an outpatient  - Currently on levothyroxine to 150 mcg  - Managed by PCP and oncology   Past  Medical History:  Diagnosis Date   Acute diverticulitis    Acute metabolic encephalopathy    Acute pain of right knee 10/14/2016   Acute renal failure (ARF) (HCC) 01/26/2019   Anemia    Arthritis    Chest pain    a. 12/2021 Cor CTA: LM nl, LAD <25%, LCX nl, RCA nl. Cor Ca2+ = 1.29.   Chronic back pain    Complicated by neuropathy. ->  On Neurontin and duloxetine along with Flexeril and Voltaren gel.  Also uses PRN tramadol.   Colon polyps    COPD with asthma    On combination of albuterol Qvar and Symbicort   Depression    Diabetes mellitus without complication (HCC)    type 2   Diabetic neuropathy (HCC)    Diastolic dysfunction    a. 09/2015 Echo: EF 55-60%, GrI DD; b. 12/2021 Echo: EF 60-65%, no rwma, GrI DD, nl RV fxn. RVSP 28.49mmHg. Mild MR.   Diverticulitis large intestine 05/12/2020   Dyspnea    GERD (gastroesophageal reflux disease)    Heart murmur    Hyperlipidemia    Hypertension    Lipoma of left upper extremity    Palpitations    a. 12/2021 Zio: Predominantly sinus rhythm at 80 (38-136).  Frequent PACs (5.3%).  5 short runs of atrial tachycardia (longest 33 beats at 119 bpm.  Fastest 160 bpm).  Triggered events associated PACs and short runs of PAT.   Thyroid cancer [facility])    Urinary incontinence    Pessary present    Past Surgical History:  Procedure Laterality Date   7-Day Zio Patch Monitor  12/2021   Predominant sinus rhythm.  Rate range 38-136 bpm with an average of 80 bpm (bradycardia only noted during hours of sleep.  5 Atrial Runs-fastest was 5 beats at a max rate 162 bpm, longest was 17.2 seconds/33 beats at a rate of 119 bpm--not noted on patient trigger.  Frequent PACs noted (5.3%).  Rare PVCs.   ABDOMINAL HYSTERECTOMY     COLONOSCOPY WITH PROPOFOL N/A 10/02/2021   Procedure: COLONOSCOPY WITH PROPOFOL;  Surgeon: Toney Reil, MD;  Location: [facility] ENDOSCOPY;  Service: Gastroenterology;  Laterality: N/A;   Coronary CT angiogram  01/03/2022   Thoracic  aortic atherosclerosis.  Coronary Calcium Score 1.29.  Minimal proximal LAD calcification. (<25%).   ESOPHAGOGASTRODUODENOSCOPY N/A 10/02/2021   Procedure: ESOPHAGOGASTRODUODENOSCOPY (EGD);  Surgeon: Toney Reil, MD;  Location: [facility] ENDOSCOPY;  Service: Gastroenterology;  Laterality: N/A;   LIPOMA EXCISION Left 07/08/2022   upper arm   THYROIDECTOMY Bilateral 11/07/2022   Procedure: TOTAL THYROIDECTOMY;  Surgeon: Vernie Murders, MD;  Location: [facility] ORS;  Service: ENT;  Laterality: Bilateral;   TRANSTHORACIC ECHOCARDIOGRAM  09/29/2015   EF 55-60%.  No R WMA.  GR 1 DD.  Is also normal valves.  Normal study.   TRANSTHORACIC ECHOCARDIOGRAM  01/09/2022   EF 60 to 65%.  Mild to moderate LVH.  G1 DD.  Normal RV size and function.  Normal atrial sizes.  Normal valves.  NORMAL ECHO   TUBAL LIGATION      Current Medications: No outpatient medications have been marked as taking for the  04/23/23 encounter (Appointment) with Creig Hines, NP.     Allergies:   Patient has no known allergies.   Social History   Socioeconomic History   Marital status: Single    Spouse name: Not on file   Number of children: 3   Years of education: Not on file   Highest education level: Not on file  Occupational History   Not on file  Tobacco Use   Smoking status: Former    Packs/day: 3.00    Years: 10.00    Additional pack years: 0.00    Total pack years: 30.00    Types: Cigarettes    Quit date: 12/22/2004    Years since quitting: 18.3   Smokeless tobacco: Never  Vaping Use   Vaping Use: Never used  Substance and Sexual Activity   Alcohol use: No   Drug use: No   Sexual activity: Not Currently  Other Topics Concern   Not on file  Social History Narrative   Rohini Vanga - GI    Ardeen Jourdain - Obgyn    Vanna Scotland - urology    Hemang Sherryll Burger - neurology    Lives alone   Social Determinants of Health   Financial Resource Strain: Not on file  Food Insecurity: No Food  Insecurity (11/07/2022)   Hunger Vital Sign    Worried About Running Out of Food in the Last Year: Never true    Ran Out of Food in the Last Year: Never true  Transportation Needs: No Transportation Needs (11/07/2022)   PRAPARE - Administrator, Civil Service (Medical): No    Lack of Transportation (Non-Medical): No  Physical Activity: Not on file  Stress: Not on file  Social Connections: Not on file     Family History: The patient's ***family history includes Hypertension in her mother; Other in an other family member.  ROS:   Please see the history of present illness.    *** All other systems reviewed and are negative.  EKGs/Labs/Other Studies Reviewed:    The following studies were reviewed today: ***  EKG:  EKG is *** ordered today.  The ekg ordered today demonstrates ***  Recent Labs: 03/25/2023: ALT 36; Hemoglobin 14.6; Platelets 253 03/26/2023: B Natriuretic Peptide 86.7; TSH 44.670 03/27/2023: BUN 18; Creatinine, Ser 1.20; Magnesium 1.8; Potassium 4.2; Sodium 137  Recent Lipid Panel    Component Value Date/Time   CHOL 132 03/26/2023 0355   TRIG 72 03/26/2023 0355   HDL 35 (L) 03/26/2023 0355   CHOLHDL 3.8 03/26/2023 0355   VLDL 14 03/26/2023 0355   LDLCALC 83 03/26/2023 0355     Risk Assessment/Calculations:   {Does this patient have ATRIAL FIBRILLATION?:[phone]}  No BP recorded.  {Refresh Note OR Click here to enter BP  :1}***         Physical Exam:    VS:  There were no vitals taken for this visit.    Wt Readings from Last 3 Encounters:  03/27/23 152 lb 12.8 oz (69.3 kg)  01/09/23 150 lb (68 kg)  12/24/22 152 lb (68.9 kg)     GEN: *** Well nourished, well developed in no acute distress HEENT: Normal NECK: No JVD; No carotid bruits LYMPHATICS: No lymphadenopathy CARDIAC: ***RRR, no murmurs, rubs, gallops RESPIRATORY:  Clear to auscultation without rales, wheezing or rhonchi  ABDOMEN: Soft, non-tender, non-distended MUSCULOSKELETAL:   No edema; No deformity  SKIN: Warm and dry NEUROLOGIC:  Alert and oriented x 3 PSYCHIATRIC:  Normal affect  ASSESSMENT:    No diagnosis found. PLAN:    In order of problems listed above:  ***      {Are you ordering a CV Procedure (e.g. stress test, cath, DCCV, TEE, etc)?   Press F2        :[id number]}    Medication Adjustments/Labs and Tests Ordered: Current medicines are reviewed at length with the patient today.  Concerns regarding medicines are outlined above.  No orders of the defined types were placed in this encounter.  No orders of the defined types were placed in this encounter.   There are no Patient Instructions on file for this visit.   Signed, Jonita Albee, PA-C  04/22/2023 8:43 PM    [facility]

## 2023-04-23 ENCOUNTER — Ambulatory Visit: Payer: Medicare HMO | Attending: Nurse Practitioner | Admitting: Nurse Practitioner

## 2023-04-23 ENCOUNTER — Ambulatory Visit: Payer: 59 | Admitting: Dietician

## 2023-04-24 ENCOUNTER — Encounter: Payer: Self-pay | Admitting: Nurse Practitioner

## 2023-04-24 ENCOUNTER — Encounter: Payer: Self-pay | Admitting: Pulmonary Disease

## 2023-05-20 ENCOUNTER — Other Ambulatory Visit (INDEPENDENT_AMBULATORY_CARE_PROVIDER_SITE_OTHER): Payer: Medicare HMO

## 2023-05-20 ENCOUNTER — Encounter: Payer: Self-pay | Admitting: Family

## 2023-05-20 ENCOUNTER — Ambulatory Visit (INDEPENDENT_AMBULATORY_CARE_PROVIDER_SITE_OTHER): Payer: Medicare HMO | Admitting: Family

## 2023-05-20 DIAGNOSIS — M5416 Radiculopathy, lumbar region: Secondary | ICD-10-CM | POA: Diagnosis not present

## 2023-05-20 DIAGNOSIS — G8929 Other chronic pain: Secondary | ICD-10-CM

## 2023-05-20 DIAGNOSIS — M25561 Pain in right knee: Secondary | ICD-10-CM

## 2023-05-20 DIAGNOSIS — M25562 Pain in left knee: Secondary | ICD-10-CM

## 2023-05-20 DIAGNOSIS — M1711 Unilateral primary osteoarthritis, right knee: Secondary | ICD-10-CM

## 2023-05-20 DIAGNOSIS — M1712 Unilateral primary osteoarthritis, left knee: Secondary | ICD-10-CM | POA: Diagnosis not present

## 2023-05-20 MED ORDER — LIDOCAINE HCL 1 % IJ SOLN
5.0000 mL | INTRAMUSCULAR | Status: AC | PRN
Start: 2023-05-20 — End: 2023-05-20
  Administered 2023-05-20: 5 mL

## 2023-05-20 MED ORDER — PREDNISONE 50 MG PO TABS
ORAL_TABLET | ORAL | 0 refills | Status: DC
Start: 2023-05-20 — End: 2023-09-03

## 2023-05-20 MED ORDER — METHYLPREDNISOLONE ACETATE 40 MG/ML IJ SUSP
40.0000 mg | INTRAMUSCULAR | Status: AC | PRN
Start: 2023-05-20 — End: 2023-05-20
  Administered 2023-05-20: 40 mg via INTRA_ARTICULAR

## 2023-05-20 NOTE — Progress Notes (Signed)
Office Visit Note   Patient: [name]           Date of Birth: Aug 25, 1966           MRN: [id number] Visit Date: 05/20/2023              Requested by: Hillery Aldo, NP [address] [place],  [place] [address] PCP: Hillery Aldo, NP  Chief Complaint  Patient presents with   Right Knee - Pain   Left Knee - Pain      HPI: The patient is a 57 year old woman who presents today complaining of a 10-year history of bilateral knee pain left worse than right.  She states these have been giving her trouble since she first began falling has had falls with direct impact on her knees.  Most recently fell 2 days ago landing directly on her left knee she is complaining of some associated bruising having exquisite knee pain.  She goes on to relate that her knee gives way she feels that she has no warning before the knee will give out she is also having associated groin hip and low back pain which is also left-sided.  Shooting pain.  This is giving her trouble at rest and as well as lying flat.  No weakness no red flag symptoms  Reports she is a patient of Dr. Ophelia Charter  Lumbar spine MRI from July 2020 shows a small central disc disc protrusion at L3-L4 with no significant neural impingement  Assessment & Plan: Visit Diagnoses:  1. Chronic pain of both knees     Plan: Depo-Medrol injection today of the left knee.  Patient tolerated well.  Will also place her on prednisone burst for her lumbar radiculopathy on the left.  She would like to follow-up with Dr. Kevan Ny for her low back and lumbar radiculopathy.  Follow-Up Instructions: No follow-ups on file.   Right Knee Exam   Muscle Strength  The patient has normal right knee strength.  Tenderness  The patient is experiencing no tenderness.   Range of Motion  The patient has normal right knee ROM.  Tests  Varus: negative Valgus: negative  Other  Effusion: no effusion present   Left Knee Exam   Tenderness  The patient is  experiencing tenderness in the medial joint line.  Tests  Varus: negative Valgus: negative  Other  Swelling: mild Effusion: effusion present   Back Exam   Tenderness  The patient is experiencing no tenderness.   Range of Motion  The patient has normal back ROM.  Muscle Strength  The patient has normal back strength.  Tests  Straight leg raise right: negative Straight leg raise left: positive  Other  Gait: abnormal       Patient is alert, oriented, no adenopathy, well-dressed, normal affect, normal respiratory effort.   Imaging: No results found. No images are attached to the encounter.  Labs: Lab Results  Component Value Date   HGBA1C 10.7 (H) 03/26/2023   HGBA1C 10.7 (H) 09/25/2022   HGBA1C 7.8 (H) 09/17/2021   REPTSTATUS 05/16/2020 FINAL 05/11/2020   CULT  05/11/2020    NO GROWTH 5 DAYS Performed at [facility], [address] [place] [place], [place] [address]      Lab Results  Component Value Date   ALBUMIN 3.7 03/25/2023   ALBUMIN 3.2 (L) 09/26/2022   ALBUMIN 3.3 (L) 09/25/2022    Lab Results  Component Value Date   MG 1.8 03/27/2023   MG 1.9 03/26/2023  MG 2.1 09/26/2022   Lab Results  Component Value Date   VD25OH 32.68 03/26/2023    No results found for: "PREALBUMIN"    Latest Ref Rng & Units 03/25/2023    7:47 PM 03/25/2023    4:23 PM 01/09/2023   10:30 AM  CBC EXTENDED  WBC 4.0 - 10.5 K/uL  6.2  4.3   RBC 3.87 - 5.11 MIL/uL  4.27  4.30   Hemoglobin 12.0 - 15.0 g/dL 40.9  81.1  91.4   HCT 36.0 - 46.0 % 43.0  41.4  38.4   Platelets 150 - 400 K/uL  253  225   NEUT# 1.7 - 7.7 K/uL   2.7   Lymph# 0.7 - 4.0 K/uL   1.3      There is no height or weight on file to calculate BMI.  Orders:  Orders Placed This Encounter  Procedures   XR Knee 1-2 Views Right   XR Knee 1-2 Views Left   No orders of the defined types were placed in this encounter.    Procedures: Large Joint Inj: L knee on 05/20/2023 10:16 AM Indications:  pain Details: 18 G 1.5 in needle, anteromedial approach Medications: 5 mL lidocaine 1 %; 40 mg methylPREDNISolone acetate 40 MG/ML Consent was given by the patient.      Clinical Data: No additional findings.  ROS:  All other systems negative, except as noted in the HPI. Review of Systems  Objective: Vital Signs: There were no vitals taken for this visit.  Specialty Comments:  No specialty comments available.  PMFS History: Patient Active Problem List   Diagnosis Date Noted   Orthostatic hypotension 03/27/2023   Syncope 03/26/2023   Orthopnea 03/26/2023   Type 2 diabetes mellitus with hyperglycemia, with long-term current use of insulin (HCC) 03/26/2023   S/P total thyroidectomy 11/07/2022   Vertigo 09/26/2022   Left-sided weakness 09/26/2022   DM (diabetes mellitus), type 2 with complications (HCC) 09/26/2022   Papillary thyroid carcinoma (HCC) 09/26/2022   Acute metabolic encephalopathy 09/25/2022   Lumbar strain 08/13/2022   Lipoma of left upper extremity    Exercise intolerance 12/26/2021   Heart murmur 12/26/2021   DOE (dyspnea on exertion) 12/26/2021   Palpitations 12/26/2021   Abdominal bloating    Polyp of colon    Multiple adenomatous polyps    Depression    Hyperlipidemia associated with type 2 diabetes mellitus (HCC)    GERD (gastroesophageal reflux disease)    Influenza A 01/26/2019   Hypokalemia 06/12/2017   Marijuana use 06/12/2017   Diabetic neuropathy (HCC) 01/07/2016   Plantar fasciitis of left foot 11/26/2015   Back pain 10/10/2015   Asthma 09/29/2015   Essential hypertension 09/29/2015   DM (diabetes mellitus) type II controlled, neurological manifestation (HCC) 09/29/2015   Past Medical History:  Diagnosis Date   Acute diverticulitis    Acute metabolic encephalopathy    Acute pain of right knee 10/14/2016   Acute renal failure (ARF) (HCC) 01/26/2019   Anemia    Arthritis    Chest pain    a. 12/2021 Cor CTA: LM nl, LAD <25%, LCX nl,  RCA nl. Cor Ca2+ = 1.29.   Chronic back pain    Complicated by neuropathy. ->  On Neurontin and duloxetine along with Flexeril and Voltaren gel.  Also uses PRN tramadol.   Colon polyps    COPD with asthma    On combination of albuterol Qvar and Symbicort   Depression    Diabetes mellitus without complication (  HCC)    type 2   Diabetic neuropathy (HCC)    Diastolic dysfunction    a. 09/2015 Echo: EF 55-60%, GrI DD; b. 12/2021 Echo: EF 60-65%, no rwma, GrI DD, nl RV fxn. RVSP 28.29mmHg. Mild MR.   Diverticulitis large intestine 05/12/2020   Dyspnea    GERD (gastroesophageal reflux disease)    Heart murmur    Hyperlipidemia    Hypertension    Lipoma of left upper extremity    Palpitations    a. 12/2021 Zio: Predominantly sinus rhythm at 80 (38-136).  Frequent PACs (5.3%).  5 short runs of atrial tachycardia (longest 33 beats at 119 bpm.  Fastest 160 bpm).  Triggered events associated PACs and short runs of PAT.   Thyroid cancer (HCC)    Urinary incontinence    Pessary present    Family History  Problem Relation Age of Onset   Hypertension Mother    Other Other     Past Surgical History:  Procedure Laterality Date   7-Day Zio Patch Monitor  12/2021   Predominant sinus rhythm.  Rate range 38-136 bpm with an average of 80 bpm (bradycardia only noted during hours of sleep.  5 Atrial Runs-fastest was 5 beats at a max rate 162 bpm, longest was 17.2 seconds/33 beats at a rate of 119 bpm--not noted on patient trigger.  Frequent PACs noted (5.3%).  Rare PVCs.   ABDOMINAL HYSTERECTOMY     COLONOSCOPY WITH PROPOFOL N/A 10/02/2021   Procedure: COLONOSCOPY WITH PROPOFOL;  Surgeon: Toney Reil, MD;  Location: [facility] ENDOSCOPY;  Service: Gastroenterology;  Laterality: N/A;   Coronary CT angiogram  01/03/2022   Thoracic aortic atherosclerosis.  Coronary Calcium Score 1.29.  Minimal proximal LAD calcification. (<25%).   ESOPHAGOGASTRODUODENOSCOPY N/A 10/02/2021   Procedure:  ESOPHAGOGASTRODUODENOSCOPY (EGD);  Surgeon: Toney Reil, MD;  Location: [facility] ENDOSCOPY;  Service: Gastroenterology;  Laterality: N/A;   LIPOMA EXCISION Left 07/08/2022   upper arm   THYROIDECTOMY Bilateral 11/07/2022   Procedure: TOTAL THYROIDECTOMY;  Surgeon: Vernie Murders, MD;  Location: [facility] ORS;  Service: ENT;  Laterality: Bilateral;   TRANSTHORACIC ECHOCARDIOGRAM  09/29/2015   EF 55-60%.  No R WMA.  GR 1 DD.  Is also normal valves.  Normal study.   TRANSTHORACIC ECHOCARDIOGRAM  01/09/2022   EF 60 to 65%.  Mild to moderate LVH.  G1 DD.  Normal RV size and function.  Normal atrial sizes.  Normal valves.  NORMAL ECHO   TUBAL LIGATION     Social History   Occupational History   Not on file  Tobacco Use   Smoking status: Former    Packs/day: 3.00    Years: 10.00    Additional pack years: 0.00    Total pack years: 30.00    Types: Cigarettes    Quit date: 12/22/2004    Years since quitting: 18.4   Smokeless tobacco: Never  Vaping Use   Vaping Use: Never used  Substance and Sexual Activity   Alcohol use: No   Drug use: No   Sexual activity: Not Currently

## 2023-05-26 ENCOUNTER — Ambulatory Visit: Payer: 59 | Admitting: Obstetrics and Gynecology

## 2023-05-26 NOTE — Progress Notes (Deleted)
[facility]   Subjective:     Chief Complaint: No chief complaint on file.  History of Present Illness: [name] is a 57 y.o. female with {PFD symptoms:24771} who presents for a pessary check. She is using a size *** {pessary type:24772} pessary. The pessary has been working well and she has no complaints. She {ACTION; IS/IS ZOX:09604540} using vaginal estrogen. She denies vaginal bleeding.  Past Medical History: Patient  has a past medical history of Acute diverticulitis, Acute metabolic encephalopathy, Acute pain of right knee (10/14/2016), Acute renal failure (ARF) (HCC) (01/26/2019), Anemia, Arthritis, Chest pain, Chronic back pain, Colon polyps, COPD with asthma, Depression, Diabetes mellitus without complication (HCC), Diabetic neuropathy (HCC), Diastolic dysfunction, Diverticulitis large intestine (05/12/2020), Dyspnea, GERD (gastroesophageal reflux disease), Heart murmur, Hyperlipidemia, Hypertension, Lipoma of left upper extremity, Palpitations, Thyroid cancer (HCC), and Urinary incontinence.   Past Surgical History: She  has a past surgical history that includes Abdominal hysterectomy; Tubal ligation; Colonoscopy with propofol (N/A, 10/02/2021); Esophagogastroduodenoscopy (N/A, 10/02/2021); transthoracic echocardiogram (09/29/2015); Coronary CT angiogram (01/03/2022); transthoracic echocardiogram (01/09/2022); 7-Day Zio Patch Monitor (12/2021); Lipoma excision (Left, 07/08/2022); and Thyroidectomy (Bilateral, 11/07/2022).   Medications: She has a current medication list which includes the following prescription(s): albuterol, albuterol, amlodipine, atorvastatin, budesonide-formoterol, vitamin d3, clobetasol prop emollient base, diclofenac sodium, gabapentin, levothyroxine, loratadine, losartan, meclizine, metformin, methocarbamol, omeprazole, prednisone, rybelsus, sertraline, and valacyclovir.   Allergies: Patient has No Known Allergies.   Social  History: Patient  reports that she quit smoking about 18 years ago. Her smoking use included cigarettes. She has a 30.00 pack-year smoking history. She has never used smokeless tobacco. She reports that she does not drink alcohol and does not use drugs.      Objective:    Physical Exam: There were no vitals taken for this visit. Gen: No apparent distress, A&O x 3. Detailed Urogynecologic Evaluation:  Pelvic Exam: Normal external female genitalia; Bartholin's and Skene's glands normal in appearance; urethral meatus {urethra:24773}, no urethral masses or discharge. The pessary was noted to be {in place:24774}. It was removed and cleaned. Speculum exam revealed {vaginal lesions:24775} in the vagina. The pessary was replaced. It was comfortable to the patient and fit well.       No data to display          Laboratory Results: Urine dipstick shows: {ua dip:315374::"negative for all components"}.    Assessment/Plan:    Assessment: [name] is a 57 y.o. with {PFD symptoms:24771} here for a pessary check. She is doing well.  Plan: She will {pessary plan:24776}. She will continue to use {lubricant:24777}. She will follow-up in *** {days/wks/mos/yrs:310907} for a pessary check or sooner as needed.  All questions were answered.   Time Spent:

## 2023-06-16 ENCOUNTER — Ambulatory Visit (INDEPENDENT_AMBULATORY_CARE_PROVIDER_SITE_OTHER): Payer: Medicare HMO | Admitting: Orthopaedic Surgery

## 2023-06-16 ENCOUNTER — Encounter: Payer: Self-pay | Admitting: Orthopaedic Surgery

## 2023-06-16 ENCOUNTER — Other Ambulatory Visit: Payer: Self-pay

## 2023-06-16 VITALS — BP 168/124 | HR 84 | Ht 58.5 in | Wt 147.0 lb

## 2023-06-16 DIAGNOSIS — M545 Low back pain, unspecified: Secondary | ICD-10-CM

## 2023-06-16 DIAGNOSIS — G8929 Other chronic pain: Secondary | ICD-10-CM

## 2023-06-16 DIAGNOSIS — M25552 Pain in left hip: Secondary | ICD-10-CM | POA: Diagnosis not present

## 2023-06-17 NOTE — Progress Notes (Signed)
Office Visit Note   Patient: [name]           Date of Birth: 01-Oct-1966           MRN: [id number] Visit Date: 06/16/2023              Requested by: Hillery Aldo, NP [address] [place],  [place] [address] PCP: Hillery Aldo, NP   Assessment & Plan: Visit Diagnoses:  1. Chronic bilateral low back pain, unspecified whether sciatica present   2. Pain in left hip     Plan: Patient states she is starting therapy shortly.  Based on findings of physical exam I do not think additional diagnostic studies are indicated at this point.  She needs to work on gait sequence and strengthening with therapy.  Recent admission in April for syncopal episodes.  She needs to work on improved diabetic control.  Follow-Up Instructions: No follow-ups on file.   Orders:  Orders Placed This Encounter  Procedures   XR HIP UNILAT W OR W/O PELVIS 2-3 VIEWS LEFT   XR Lumbar Spine 2-3 Views   No orders of the defined types were placed in this encounter.     Procedures: No procedures performed   Clinical Data: No additional findings.   Subjective: Chief Complaint  Patient presents with   Lower Back - Pain   Left Hip - Pain    HPI 57 year old female with chronic knee pain and back pain.  States she has had multiple falls.  She is on disability and had previous knee injection 05/20/2023.  She has had prednisone pack for back symptoms.  She states her left leg gives out.  When she stands she has tremulous gait shuffles and put 1 foot in front of the other with short stride gait about 6 inches for each step.  Gait changes with changes of direction.  She is ambulating with a cane.  She has used ibuprofen as well as gabapentin.  Brain MRI in March showed no evidence of CVA.  She had a CT scan of her neck which showed some calcified right thyroid nodule.  No skeletal problems.  2020 MRI T-spine and lumbar spine without evidence of stenosis.  No associated bowel or bladder symptoms.  Review of  Systems positive for depression diabetes type 2, hypertension, GERD, poor diabetic control last A1c 10.7.  All systems noncontributory HPI.   Objective: Vital Signs: BP (!) 168/124   Pulse 84   Ht 4' 10.5" (1.486 m)   Wt 147 lb (66.7 kg)   BMI 30.20 kg/m   Physical Exam Constitutional:      Appearance: She is well-developed.  HENT:     Head: Normocephalic.     Right Ear: External ear normal.     Left Ear: External ear normal. There is no impacted cerumen.  Eyes:     Pupils: Pupils are equal, round, and reactive to light.  Neck:     Thyroid: No thyromegaly.     Trachea: No tracheal deviation.  Cardiovascular:     Rate and Rhythm: Normal rate.  Pulmonary:     Effort: Pulmonary effort is normal.  Abdominal:     Palpations: Abdomen is soft.  Musculoskeletal:     Cervical back: No rigidity.  Skin:    General: Skin is warm and dry.  Neurological:     Mental Status: She is alert and oriented to person, place, and time.  Psychiatric:        Behavior: Behavior normal.  Ortho Exam motor reflexes lower extremity are intact.  She has giving way weakness and comfortable opposing muscles.  Negative logroll hips knees reach full extension knee and ankle jerk are symmetrical.  No upper extremity hyperreflexia.  Specialty Comments:  No specialty comments available.  Imaging: No results found.   PMFS History: Patient Active Problem List   Diagnosis Date Noted   Orthostatic hypotension 03/27/2023   Syncope 03/26/2023   Orthopnea 03/26/2023   Type 2 diabetes mellitus with hyperglycemia, with long-term current use of insulin (HCC) 03/26/2023   S/P total thyroidectomy 11/07/2022   Vertigo 09/26/2022   Left-sided weakness 09/26/2022   DM (diabetes mellitus), type 2 with complications (HCC) 09/26/2022   Papillary thyroid carcinoma (HCC) 09/26/2022   Acute metabolic encephalopathy 09/25/2022   Lumbar strain 08/13/2022   Lipoma of left upper extremity    Exercise intolerance  12/26/2021   Heart murmur 12/26/2021   DOE (dyspnea on exertion) 12/26/2021   Palpitations 12/26/2021   Abdominal bloating    Polyp of colon    Multiple adenomatous polyps    Depression    Hyperlipidemia associated with type 2 diabetes mellitus (HCC)    GERD (gastroesophageal reflux disease)    Influenza A 01/26/2019   Hypokalemia 06/12/2017   Marijuana use 06/12/2017   Diabetic neuropathy (HCC) 01/07/2016   Plantar fasciitis of left foot 11/26/2015   Back pain 10/10/2015   Asthma 09/29/2015   Essential hypertension 09/29/2015   DM (diabetes mellitus) type II controlled, neurological manifestation (HCC) 09/29/2015   Past Medical History:  Diagnosis Date   Acute diverticulitis    Acute metabolic encephalopathy    Acute pain of right knee 10/14/2016   Acute renal failure (ARF) (HCC) 01/26/2019   Anemia    Arthritis    Chest pain    a. 12/2021 Cor CTA: LM nl, LAD <25%, LCX nl, RCA nl. Cor Ca2+ = 1.29.   Chronic back pain    Complicated by neuropathy. ->  On Neurontin and duloxetine along with Flexeril and Voltaren gel.  Also uses PRN tramadol.   Colon polyps    COPD with asthma    On combination of albuterol Qvar and Symbicort   Depression    Diabetes mellitus without complication (HCC)    type 2   Diabetic neuropathy (HCC)    Diastolic dysfunction    a. 09/2015 Echo: EF 55-60%, GrI DD; b. 12/2021 Echo: EF 60-65%, no rwma, GrI DD, nl RV fxn. RVSP 28.24mmHg. Mild MR.   Diverticulitis large intestine 05/12/2020   Dyspnea    GERD (gastroesophageal reflux disease)    Heart murmur    Hyperlipidemia    Hypertension    Lipoma of left upper extremity    Palpitations    a. 12/2021 Zio: Predominantly sinus rhythm at 80 (38-136).  Frequent PACs (5.3%).  5 short runs of atrial tachycardia (longest 33 beats at 119 bpm.  Fastest 160 bpm).  Triggered events associated PACs and short runs of PAT.   Thyroid cancer (HCC)    Urinary incontinence    Pessary present    Family History   Problem Relation Age of Onset   Hypertension Mother    Other Other     Past Surgical History:  Procedure Laterality Date   7-Day Zio Patch Monitor  12/2021   Predominant sinus rhythm.  Rate range 38-136 bpm with an average of 80 bpm (bradycardia only noted during hours of sleep.  5 Atrial Runs-fastest was 5 beats at a max rate 162 bpm,  longest was 17.2 seconds/33 beats at a rate of 119 bpm--not noted on patient trigger.  Frequent PACs noted (5.3%).  Rare PVCs.   ABDOMINAL HYSTERECTOMY     COLONOSCOPY WITH PROPOFOL N/A 10/02/2021   Procedure: COLONOSCOPY WITH PROPOFOL;  Surgeon: Toney Reil, MD;  Location: [facility] ENDOSCOPY;  Service: Gastroenterology;  Laterality: N/A;   Coronary CT angiogram  01/03/2022   Thoracic aortic atherosclerosis.  Coronary Calcium Score 1.29.  Minimal proximal LAD calcification. (<25%).   ESOPHAGOGASTRODUODENOSCOPY N/A 10/02/2021   Procedure: ESOPHAGOGASTRODUODENOSCOPY (EGD);  Surgeon: Toney Reil, MD;  Location: [facility] ENDOSCOPY;  Service: Gastroenterology;  Laterality: N/A;   LIPOMA EXCISION Left 07/08/2022   upper arm   THYROIDECTOMY Bilateral 11/07/2022   Procedure: TOTAL THYROIDECTOMY;  Surgeon: Vernie Murders, MD;  Location: [facility] ORS;  Service: ENT;  Laterality: Bilateral;   TRANSTHORACIC ECHOCARDIOGRAM  09/29/2015   EF 55-60%.  No R WMA.  GR 1 DD.  Is also normal valves.  Normal study.   TRANSTHORACIC ECHOCARDIOGRAM  01/09/2022   EF 60 to 65%.  Mild to moderate LVH.  G1 DD.  Normal RV size and function.  Normal atrial sizes.  Normal valves.  NORMAL ECHO   TUBAL LIGATION     Social History   Occupational History   Not on file  Tobacco Use   Smoking status: Former    Packs/day: 3.00    Years: 10.00    Additional pack years: 0.00    Total pack years: 30.00    Types: Cigarettes    Quit date: 12/22/2004    Years since quitting: 18.4   Smokeless tobacco: Never  Vaping Use   Vaping Use: Never used  Substance and Sexual Activity   Alcohol  use: No   Drug use: No   Sexual activity: Not Currently

## 2023-07-08 ENCOUNTER — Telehealth: Payer: Self-pay | Admitting: Pulmonary Disease

## 2023-07-08 NOTE — Telephone Encounter (Signed)
Spoke to patient. She lives in [place] and would like to be seen at our [facility] [place] office.  Appt scheduled 09/03/2023 at 8:30. Address provided.  Nothing further needed.

## 2023-07-08 NOTE — Telephone Encounter (Signed)
Patient would like to switch providers to the [place] office. Needs appointment. Patient phone number is [phone].

## 2023-07-24 ENCOUNTER — Ambulatory Visit: Payer: Medicare HMO | Admitting: Orthopaedic Surgery

## 2023-08-04 ENCOUNTER — Ambulatory Visit (INDEPENDENT_AMBULATORY_CARE_PROVIDER_SITE_OTHER): Payer: Medicare HMO | Admitting: Obstetrics and Gynecology

## 2023-08-04 ENCOUNTER — Encounter: Payer: Self-pay | Admitting: Obstetrics and Gynecology

## 2023-08-04 ENCOUNTER — Other Ambulatory Visit (HOSPITAL_COMMUNITY)
Admission: RE | Admit: 2023-08-04 | Discharge: 2023-08-04 | Disposition: A | Discharge status: Home / Self Care | Payer: Medicare HMO | Source: Ambulatory Visit | Attending: Obstetrics and Gynecology | Admitting: Obstetrics and Gynecology

## 2023-08-04 VITALS — BP 124/84 | HR 78

## 2023-08-04 DIAGNOSIS — Z4689 Encounter for fitting and adjustment of other specified devices: Secondary | ICD-10-CM

## 2023-08-04 DIAGNOSIS — E118 Type 2 diabetes mellitus with unspecified complications: Secondary | ICD-10-CM

## 2023-08-04 DIAGNOSIS — B9689 Other specified bacterial agents as the cause of diseases classified elsewhere: Secondary | ICD-10-CM

## 2023-08-04 DIAGNOSIS — N760 Acute vaginitis: Secondary | ICD-10-CM | POA: Diagnosis not present

## 2023-08-04 DIAGNOSIS — L900 Lichen sclerosus et atrophicus: Secondary | ICD-10-CM | POA: Diagnosis not present

## 2023-08-04 DIAGNOSIS — N898 Other specified noninflammatory disorders of vagina: Secondary | ICD-10-CM | POA: Diagnosis present

## 2023-08-04 DIAGNOSIS — N993 Prolapse of vaginal vault after hysterectomy: Secondary | ICD-10-CM

## 2023-08-04 DIAGNOSIS — B379 Candidiasis, unspecified: Secondary | ICD-10-CM | POA: Diagnosis not present

## 2023-08-04 DIAGNOSIS — N8110 Cystocele, unspecified: Secondary | ICD-10-CM

## 2023-08-04 DIAGNOSIS — N816 Rectocele: Secondary | ICD-10-CM

## 2023-08-04 MED ORDER — FLUCONAZOLE 150 MG PO TABS
150.0000 mg | ORAL_TABLET | Freq: Once | ORAL | 0 refills | Status: AC
Start: 2023-08-04 — End: 2023-08-04

## 2023-08-04 MED ORDER — CLOBETASOL PROPIONATE E 0.05 % EX CREA
1.0000 | TOPICAL_CREAM | Freq: Every evening | CUTANEOUS | 2 refills | Status: AC
Start: 2023-08-04 — End: ?

## 2023-08-04 NOTE — Progress Notes (Signed)
[facility]   Subjective:     Chief Complaint:  Chief Complaint  Patient presents with   Pessary Check    [name] is a 57 y.o. female is her for pessary check.    History of Present Illness: [name] is a 57 y.o. female with stage II pelvic organ prolapse who presents for a pessary check. She is using a size #3 incontinence ring with support pessary. The pessary has been working well and she has no complaints. She is not using vaginal estrogen. She denies vaginal bleeding.  Past Medical History: Patient  has a past medical history of Acute diverticulitis, Acute metabolic encephalopathy, Acute pain of right knee (10/14/2016), Acute renal failure (ARF) (HCC) (01/26/2019), Anemia, Arthritis, Chest pain, Chronic back pain, Colon polyps, COPD with asthma, Depression, Diabetes mellitus without complication (HCC), Diabetic neuropathy (HCC), Diastolic dysfunction, Diverticulitis large intestine (05/12/2020), Dyspnea, GERD (gastroesophageal reflux disease), Heart murmur, Hyperlipidemia, Hypertension, Lipoma of left upper extremity, Palpitations, Thyroid cancer (HCC), and Urinary incontinence.   Past Surgical History: She  has a past surgical history that includes Abdominal hysterectomy; Tubal ligation; Colonoscopy with propofol (N/A, 10/02/2021); Esophagogastroduodenoscopy (N/A, 10/02/2021); transthoracic echocardiogram (09/29/2015); Coronary CT angiogram (01/03/2022); transthoracic echocardiogram (01/09/2022); 7-Day Zio Patch Monitor (12/2021); Lipoma excision (Left, 07/08/2022); and Thyroidectomy (Bilateral, 11/07/2022).   Medications: She has a current medication list which includes the following prescription(s): albuterol, albuterol, budesonide-formoterol, vitamin d3, diclofenac sodium, fluconazole, loratadine, meclizine, methocarbamol, omeprazole, prednisone, rybelsus, sertraline, valacyclovir, amlodipine, atorvastatin, clobetasol propionate e, gabapentin,  levothyroxine, losartan, and metformin.   Allergies: Patient has No Known Allergies.   Social History: Patient  reports that she quit smoking about 18 years ago. Her smoking use included cigarettes. She started smoking about 28 years ago. She has a 30 pack-year smoking history. She has never used smokeless tobacco. She reports that she does not drink alcohol and does not use drugs.      Objective:    Physical Exam: BP 124/84   Pulse 78  Gen: No apparent distress, A&O x 3. Detailed Urogynecologic Evaluation:  Pelvic Exam: Lichen's sclerosus is obviously irritated and inflamed on the external female genitalia. Concern for yeast at the introitus with thick white vaginal discharge. Bartholin's and Skene's glands normal in appearance; urethral meatus normal in appearance, no urethral masses or discharge. The pessary was noted to be in place but turned around. It was removed and cleaned. Speculum exam revealed no lesions in the vagina. The pessary was replaced. It was comfortable to the patient and fit well.    Patient also had swelling in her ankles bilaterally on physical exam and bruising to multiple areas of her body which she reported were from frequent falls.    Assessment/Plan:    Assessment: [name] is a 57 y.o. with stage II pelvic organ prolapse here for a pessary check. She is doing well.  Plan: She will keep the pessary in place until next visit. She will continue to use lubricant. She will follow-up in 3 months for a pessary check or sooner as needed.   Clobetasol cream re-prescribed for patient to use on the external labia lips. Encouraged her to re-start using that today.   Discussed again about her blood sugar and the affect on her kidney function and concerns for overall health. We discussed her most recent A1c of 10.7 and how this impacts her body as well as more appropriate food and beverage choices.   Diflucan sent to pharmacy for presumed yeast infection.  All questions were answered. Patient to return in 3 months

## 2023-08-04 NOTE — Patient Instructions (Signed)
Decrease your pasta, bread, fried foods, and sugary drinks. Potatoes and rice all turn to sugar in your body.   Salads, Grilled proteins, and roasted veggies.  Use your clobetasol cream on the outside of the vaigna and take the dose of diflucan for possible yeast.

## 2023-08-05 MED ORDER — METRONIDAZOLE 500 MG PO TABS
500.0000 mg | ORAL_TABLET | Freq: Two times a day (BID) | ORAL | 0 refills | Status: AC
Start: 2023-08-05 — End: 2023-08-12

## 2023-08-05 MED ORDER — FLUCONAZOLE 150 MG PO TABS
150.0000 mg | ORAL_TABLET | Freq: Once | ORAL | 0 refills | Status: AC
Start: 2023-08-05 — End: 2023-08-05

## 2023-08-05 NOTE — Addendum Note (Signed)
Addended by: Selmer Dominion on: 08/05/2023 12:40 PM   Modules accepted: Orders

## 2023-09-03 ENCOUNTER — Ambulatory Visit (INDEPENDENT_AMBULATORY_CARE_PROVIDER_SITE_OTHER): Payer: Medicare HMO | Admitting: Emergency Medicine

## 2023-09-03 ENCOUNTER — Encounter: Payer: Self-pay | Admitting: Emergency Medicine

## 2023-09-03 VITALS — BP 138/92 | HR 58 | Temp 98.3°F | Ht 61.0 in | Wt 144.2 lb

## 2023-09-03 DIAGNOSIS — R0602 Shortness of breath: Secondary | ICD-10-CM | POA: Diagnosis not present

## 2023-09-03 DIAGNOSIS — J309 Allergic rhinitis, unspecified: Secondary | ICD-10-CM | POA: Insufficient documentation

## 2023-09-03 DIAGNOSIS — J45909 Unspecified asthma, uncomplicated: Secondary | ICD-10-CM

## 2023-09-03 DIAGNOSIS — J301 Allergic rhinitis due to pollen: Secondary | ICD-10-CM

## 2023-09-03 MED ORDER — FLUTICASONE PROPIONATE 50 MCG/ACT NA SUSP: 2.0000 | Freq: Every day | NASAL | 5 refills | Status: AC

## 2023-09-03 NOTE — Progress Notes (Signed)
Subjective:    Patient ID: [name], female    DOB: [birth year], 57 y.o.   MRN: [id number]  HPI 57 year old woman, former smoker (30 pack years. Quit 19 yrs ago) with a history of COPD/asthma, diabetes with neuropathy, hypertension with diastolic CHF, chronic back pain, thyroid cancer.  She has been followed by Dr. Jayme Cloud in [facility] for her obstructive lung disease, is transitioning to [place].  Currently managed on Symbicort, has albuterol nebs and HFA.  She uses albuterol approximately Loratadine, but she still has a lot of congestion and drainage.  She describes UA noise and SOB that has been a problem since February when she had thyroidectomy for thyroid CA. She has globus sensation and throat clearing, worse when she is around pollen, when she lays supine. She has intermittent stridor on insp. Her voice has changed some.   Pulmonary function testing 06/15/2017 reviewed by me show normal airflows, FEV1 1.82 L (88% predicted), no bronchodilator response, evidence for hyperinflation based on elevated RV, decreased diffusion capacity that corrects to normal range when adjusted for alveolar volume.  Flow-volume loop is grossly normal.  Chest x-ray 03/25/2023 reviewed by me, shows some mild basilar atelectasis but otherwise no abnormalities   Review of Systems As per HPI  Past Medical History:  Diagnosis Date   Acute diverticulitis    Acute metabolic encephalopathy    Acute pain of right knee 10/14/2016   Acute renal failure (ARF) (HCC) 01/26/2019   Anemia    Arthritis    Chest pain    a. 12/2021 Cor CTA: LM nl, LAD <25%, LCX nl, RCA nl. Cor Ca2+ = 1.29.   Chronic back pain    Complicated by neuropathy. ->  On Neurontin and duloxetine along with Flexeril and Voltaren gel.  Also uses PRN tramadol.   Colon polyps    COPD with asthma    On combination of albuterol Qvar and Symbicort   Depression    Diabetes mellitus without complication (HCC)    type 2   Diabetic  neuropathy (HCC)    Diastolic dysfunction    a. 09/2015 Echo: EF 55-60%, GrI DD; b. 12/2021 Echo: EF 60-65%, no rwma, GrI DD, nl RV fxn. RVSP 28.23mmHg. Mild MR.   Diverticulitis large intestine 05/12/2020   Dyspnea    GERD (gastroesophageal reflux disease)    Heart murmur    Hyperlipidemia    Hypertension    Lipoma of left upper extremity    Palpitations    a. 12/2021 [facility]: Predominantly sinus rhythm at 80 (38-136).  Frequent PACs (5.3%).  5 short runs of atrial tachycardia (longest 33 beats at 119 bpm.  Fastest 160 bpm).  Triggered events associated PACs and short runs of PAT.   Thyroid cancer (HCC)    Urinary incontinence    Pessary present     Family History  Problem Relation Age of Onset   Hypertension Mother    Other Other      Social History   Socioeconomic History   Marital status: Single    Spouse name: Not on file   Number of children: 3   Years of education: Not on file   Highest education level: Not on file  Occupational History   Not on file  Tobacco Use   Smoking status: Former    Current packs/day: 0.00    Average packs/day: 3.0 packs/day for 10.0 years (30.0 ttl pk-yrs)    Types: Cigarettes    Start date: 12/22/1994    Quit date: 12/22/2004  Years since quitting: 18.7   Smokeless tobacco: Never  Vaping Use   Vaping status: Never Used  Substance and Sexual Activity   Alcohol use: No   Drug use: No   Sexual activity: Not Currently  Other Topics Concern   Not on file  Social History Narrative   Rohini Vanga - GI    Ardeen Jourdain - Obgyn    Vanna Scotland - urology    Hemang Sherryll Burger - neurology    Lives alone   Social Determinants of Health   Financial Resource Strain: Not on file  Food Insecurity: No Food Insecurity (11/07/2022)   Hunger Vital Sign    Worried About Running Out of Food in the Last Year: Never true    Ran Out of Food in the Last Year: Never true  Transportation Needs: No Transportation Needs (11/07/2022)   PRAPARE -  Administrator, Civil Service (Medical): No    Lack of Transportation (Non-Medical): No  Physical Activity: Not on file  Stress: Not on file  Social Connections: Not on file  Intimate Partner Violence: Not At Risk (11/07/2022)   Humiliation, Afraid, Rape, and Kick questionnaire    Fear of Current or Ex-Partner: No    Emotionally Abused: No    Physically Abused: No    Sexually Abused: No     No Known Allergies   Outpatient Medications Prior to Visit  Medication Sig Dispense Refill   albuterol (PROVENTIL) (2.5 MG/3ML) 0.083% nebulizer solution Take 3 mLs (2.5 mg total) by nebulization every 2 (two) hours as needed for wheezing or shortness of breath. 75 mL 0   albuterol (VENTOLIN HFA) 108 (90 Base) MCG/ACT inhaler Inhale 2 puffs into the lungs every 6 (six) hours as needed for wheezing or shortness of breath. 8 g 2   budesonide-formoterol (SYMBICORT) 160-4.5 MCG/ACT inhaler Inhale 2 puffs into the lungs 2 (two) times daily. 1 each 12   Cholecalciferol (VITAMIN D3) 1.25 MG (50000 UT) CAPS Take 1 capsule by mouth once a week. wednesday     Clobetasol Prop Emollient Base (CLOBETASOL PROPIONATE E) 0.05 % emollient cream Apply 1 Application topically at bedtime. 30 g 2   diclofenac sodium (VOLTAREN) 1 % GEL Apply 2 g topically 4 (four) times daily as needed (pain). 1 Tube 0   loratadine (CLARITIN) 10 MG tablet Take 1 tablet (10 mg total) by mouth daily. 30 tablet 0   meclizine (ANTIVERT) 25 MG tablet Take 1 tablet (25 mg total) by mouth 3 (three) times daily as needed for dizziness. 30 tablet 0   methocarbamol (ROBAXIN) 500 MG tablet TAKE 1 TABLET BY MOUTH EVERY 6 HOURS AS NEEDED FOR MUSCLE SPASMS 30 tablet 0   NOVOLOG FLEXPEN 100 UNIT/ML FlexPen Inject into the skin 2 (two) times daily.     omeprazole (PRILOSEC) 40 MG capsule Take 1 capsule (40 mg total) by mouth daily. 30 capsule 2   Semaglutide (RYBELSUS) 7 MG TABS Take 1 tablet (7 mg total) by mouth daily. 30 tablet 0    sertraline (ZOLOFT) 50 MG tablet Take 1.5 tablets (75 mg total) by mouth every morning. (Patient taking differently: Take 75 mg by mouth daily.) 45 tablet 0   valACYclovir (VALTREX) 1000 MG tablet Take 1 tablet (1,000 mg total) by mouth 3 (three) times daily. 21 tablet 0   amLODipine (NORVASC) 10 MG tablet Take 1 tablet (10 mg total) by mouth daily. 30 tablet 0   atorvastatin (LIPITOR) 40 MG tablet Take 1 tablet (40 mg  total) by mouth daily. 30 tablet 0   gabapentin (NEURONTIN) 300 MG capsule Take 1 capsule (300 mg total) by mouth 2 (two) times daily for 15 days. (Patient not taking: Reported on 03/26/2023) 30 capsule 0   levothyroxine (SYNTHROID) 150 MCG tablet Take 1 tablet (150 mcg total) by mouth daily at 6 (six) AM. 30 tablet 0   losartan (COZAAR) 100 MG tablet Take 1 tablet (100 mg total) by mouth daily. 30 tablet 0   metFORMIN (GLUCOPHAGE-XR) 500 MG 24 hr tablet Take 2 tablets (1,000 mg total) by mouth 2 (two) times daily with a meal. 120 tablet 0   predniSONE (DELTASONE) 50 MG tablet Take one tablet by mouth once daily for 5 days. (Patient not taking: Reported on 09/03/2023) 5 tablet 0   No facility-administered medications prior to visit.        Objective:   Physical Exam  Vitals:   09/03/23 0836  BP: (!) 138/92  Pulse: (!) 58  Temp: 98.3 F (36.8 C)  TempSrc: Oral  SpO2: 100%  Weight: 144 lb 3.2 oz (65.4 kg)  Height: 5\' 1"  (1.549 m)    Gen: Pleasant, well-nourished, in no distress,  normal affect  ENT: No lesions, somewhat hoarse voice, intermittent loud insp stridor, no stridor on expiration  Neck: No JVD, stridor as above  Lungs: No use of accessory muscles, distant, somewhat small breaths, no crackles or wheezing on normal respiration, no wheeze on forced expiration.  Loud referred upper airway noise  Cardiovascular: RRR, heart sounds normal, no murmur or gallops, no peripheral edema  Musculoskeletal: No deformities, no cyanosis or clubbing  Neuro: alert, awake,  non focal  Skin: Warm, no lesions or rash     Assessment & Plan:   Shortness of breath Out of proportion to her defined obstructive lung disease, asthma/COPD.  She has inspiratory stridor that has been a problem since she had her thyroid surgery in February.  She relates this to her progressive shortness of breath.  She is at risk for upper airway injury, vocal cord injury, fixed large airway obstruction.  We will start the evaluation with repeat pulmonary function testing to evaluate airflows, repeat CT of the neck.  Depending on these results we will decide whether she needs an ENT evaluation or bronchoscopy visual as her upper airway, post glottic space.  We will arrange for repeat pulmonary function testing We will arrange for repeat CT scan of your neck Depending on how you are testing looks we may decide to pursue bronchoscopy or ear nose and throat evaluation to evaluate your upper airway for any evidence of blockage Please continue your Symbicort 2 puffs twice a day.  Rinse and gargle after using. Keep your albuterol available to use 2 puffs or 1 nebulizer treatment up to every 4 hours if needed for shortness of breath, chest tightness, wheezing. Follow Dr. Delton Coombes next available after your testing so we can review  Asthma Her asthma seems to be fairly well-controlled on current regimen.  Will continue her Symbicort, albuterol.  Work to control her rhinitis more effectively to help both the asthma and the upper airway process.  Allergic rhinitis Stop your loratadine (Claritin) Start Zyrtec 10 mg once daily Start fluticasone nasal spray, 2 sprays each nostril once daily   Levy Pupa, MD, PhD 09/03/2023, 8:59 AM [facility] [phone] or if no answer before 7:00PM call [phone] For any issues after 7:00PM please call eLink [phone]

## 2023-09-03 NOTE — Patient Instructions (Signed)
We will arrange for repeat pulmonary function testing We will arrange for repeat CT scan of your neck Depending on how you are testing looks we may decide to pursue bronchoscopy or ear nose and throat evaluation to evaluate your upper airway for any evidence of blockage Please continue your Symbicort 2 puffs twice a day.  Rinse and gargle after using. Keep your albuterol available to use 2 puffs or 1 nebulizer treatment up to every 4 hours if needed for shortness of breath, chest tightness, wheezing. Stop your loratadine (Claritin) Start Zyrtec 10 mg once daily Start fluticasone nasal spray, 2 sprays each nostril once daily Follow Dr. Delton Coombes next available after your testing so we can review

## 2023-09-03 NOTE — Assessment & Plan Note (Signed)
Stop your loratadine (Claritin) Start Zyrtec 10 mg once daily Start fluticasone nasal spray, 2 sprays each nostril once daily

## 2023-09-03 NOTE — Assessment & Plan Note (Signed)
Her asthma seems to be fairly well-controlled on current regimen.  Will continue her Symbicort, albuterol.  Work to control her rhinitis more effectively to help both the asthma and the upper airway process.

## 2023-09-03 NOTE — Assessment & Plan Note (Signed)
Out of proportion to her defined obstructive lung disease, asthma/COPD.  She has inspiratory stridor that has been a problem since she had her thyroid surgery in February.  She relates this to her progressive shortness of breath.  She is at risk for upper airway injury, vocal cord injury, fixed large airway obstruction.  We will start the evaluation with repeat pulmonary function testing to evaluate airflows, repeat CT of the neck.  Depending on these results we will decide whether she needs an ENT evaluation or bronchoscopy visual as her upper airway, post glottic space.  We will arrange for repeat pulmonary function testing We will arrange for repeat CT scan of your neck Depending on how you are testing looks we may decide to pursue bronchoscopy or ear nose and throat evaluation to evaluate your upper airway for any evidence of blockage Please continue your Symbicort 2 puffs twice a day.  Rinse and gargle after using. Keep your albuterol available to use 2 puffs or 1 nebulizer treatment up to every 4 hours if needed for shortness of breath, chest tightness, wheezing. Follow Dr. Delton Coombes next available after your testing so we can review

## 2023-09-10 ENCOUNTER — Emergency Department (HOSPITAL_COMMUNITY): Payer: Medicare HMO

## 2023-09-10 ENCOUNTER — Emergency Department (HOSPITAL_COMMUNITY)
Admission: EM | Admit: 2023-09-10 | Discharge: 2023-09-10 | Disposition: A | Discharge status: Home / Self Care | Payer: Medicare HMO | Attending: Emergency Medicine | Admitting: Emergency Medicine

## 2023-09-10 DIAGNOSIS — Z7984 Long term (current) use of oral hypoglycemic drugs: Secondary | ICD-10-CM | POA: Insufficient documentation

## 2023-09-10 DIAGNOSIS — R945 Abnormal results of liver function studies: Secondary | ICD-10-CM | POA: Insufficient documentation

## 2023-09-10 DIAGNOSIS — J449 Chronic obstructive pulmonary disease, unspecified: Secondary | ICD-10-CM | POA: Insufficient documentation

## 2023-09-10 DIAGNOSIS — Z794 Long term (current) use of insulin: Secondary | ICD-10-CM | POA: Diagnosis not present

## 2023-09-10 DIAGNOSIS — Z79899 Other long term (current) drug therapy: Secondary | ICD-10-CM | POA: Diagnosis not present

## 2023-09-10 DIAGNOSIS — Z85850 Personal history of malignant neoplasm of thyroid: Secondary | ICD-10-CM | POA: Diagnosis not present

## 2023-09-10 DIAGNOSIS — Z87891 Personal history of nicotine dependence: Secondary | ICD-10-CM | POA: Diagnosis not present

## 2023-09-10 DIAGNOSIS — J45909 Unspecified asthma, uncomplicated: Secondary | ICD-10-CM | POA: Diagnosis not present

## 2023-09-10 DIAGNOSIS — R1012 Left upper quadrant pain: Secondary | ICD-10-CM | POA: Insufficient documentation

## 2023-09-10 DIAGNOSIS — I10 Essential (primary) hypertension: Secondary | ICD-10-CM | POA: Insufficient documentation

## 2023-09-10 DIAGNOSIS — Z7951 Long term (current) use of inhaled steroids: Secondary | ICD-10-CM | POA: Diagnosis not present

## 2023-09-10 DIAGNOSIS — E1140 Type 2 diabetes mellitus with diabetic neuropathy, unspecified: Secondary | ICD-10-CM | POA: Insufficient documentation

## 2023-09-10 DIAGNOSIS — R7989 Other specified abnormal findings of blood chemistry: Secondary | ICD-10-CM

## 2023-09-10 LAB — URINALYSIS, ROUTINE W REFLEX MICROSCOPIC
Bacteria, UA: NONE SEEN
Bilirubin Urine: NEGATIVE
Glucose, UA: NEGATIVE mg/dL
Hgb urine dipstick: NEGATIVE
Ketones, ur: NEGATIVE mg/dL
Leukocytes,Ua: NEGATIVE
Nitrite: NEGATIVE
Protein, ur: 30 mg/dL — AB
Specific Gravity, Urine: 1.018 (ref 1.005–1.030)
pH: 6 (ref 5.0–8.0)

## 2023-09-10 LAB — COMPREHENSIVE METABOLIC PANEL
ALT: 246 U/L — ABNORMAL HIGH (ref 0–44)
AST: 147 U/L — ABNORMAL HIGH (ref 15–41)
Albumin: 3.9 g/dL (ref 3.5–5.0)
Alkaline Phosphatase: 114 U/L (ref 38–126)
Anion gap: 12 (ref 5–15)
BUN: 20 mg/dL (ref 6–20)
CO2: 27 mmol/L (ref 22–32)
Calcium: 9.9 mg/dL (ref 8.9–10.3)
Chloride: 94 mmol/L — ABNORMAL LOW (ref 98–111)
Creatinine, Ser: 1.19 mg/dL — ABNORMAL HIGH (ref 0.44–1.00)
GFR, Estimated: 54 mL/min — ABNORMAL LOW (ref >60)
Glucose, Bld: 114 mg/dL — ABNORMAL HIGH (ref 70–99)
Potassium: 3.5 mmol/L (ref 3.5–5.1)
Sodium: 133 mmol/L — ABNORMAL LOW (ref 135–145)
Total Bilirubin: 0.6 mg/dL (ref 0.3–1.2)
Total Protein: 7 g/dL (ref 6.5–8.1)

## 2023-09-10 LAB — CBC
HCT: 37.5 % (ref 36.0–46.0)
Hemoglobin: 12.2 g/dL (ref 12.0–15.0)
MCH: 31.5 pg (ref 26.0–34.0)
MCHC: 32.5 g/dL (ref 30.0–36.0)
MCV: 96.9 fL (ref 80.0–100.0)
Platelets: 221 10*3/uL (ref 150–400)
RBC: 3.87 MIL/uL (ref 3.87–5.11)
RDW: 14.8 % (ref 11.5–15.5)
WBC: 6 10*3/uL (ref 4.0–10.5)
nRBC: 0 % (ref 0.0–0.2)

## 2023-09-10 LAB — LIPASE, BLOOD: Lipase: 33 U/L (ref 11–51)

## 2023-09-10 MED ORDER — MAALOX MAX 400-400-40 MG/5ML PO SUSP: 15.0000 mL | Freq: Four times a day (QID) | ORAL | 0 refills | Status: AC | PRN

## 2023-09-10 MED ORDER — HYDROMORPHONE HCL 1 MG/ML IJ SOLN
0.5000 mg | Freq: Once | INTRAMUSCULAR | Status: AC
  Administered 2023-09-10: 0.5 mg via INTRAVENOUS
  Filled 2023-09-10: qty 1

## 2023-09-10 MED ORDER — IOHEXOL 300 MG/ML  SOLN
100.0000 mL | Freq: Once | INTRAMUSCULAR | Status: AC | PRN
  Administered 2023-09-10: 100 mL via INTRAVENOUS

## 2023-09-10 MED ORDER — ALUM & MAG HYDROXIDE-SIMETH 200-200-20 MG/5ML PO SUSP
30.0000 mL | Freq: Once | ORAL | Status: AC
  Administered 2023-09-10: 30 mL via ORAL
  Filled 2023-09-10: qty 30

## 2023-09-10 NOTE — ED Triage Notes (Signed)
Patient here from home reporting abd pain - reports pcp reporting elevated kidney enzymes with pancreatitis. Advised to come to the ED.

## 2023-09-10 NOTE — ED Notes (Signed)
Pt given ham sandwich, chicken dinner tray, and sprite to drink

## 2023-09-10 NOTE — ED Provider Notes (Signed)
[place] EMERGENCY DEPARTMENT AT [facility] Provider Note  CSN: [id number] Arrival date & time: 09/10/23 1302  Chief Complaint(s) Abdominal Pain and Abnormal Lab  HPI BARBY KANNING is a 57 y.o. female history of diabetes, hyperlipidemia, hypertension presenting with abdominal pain.  Patient reports she has abdominal pain in the upper abdomen and left upper abdomen.  Reports this has been going on for the past few days.  No nausea, vomiting.  No fevers or chills.  No diarrhea.  No urinary symptoms.  She reports she saw her primary doctor who told her that she might have pancreatitis or abnormal kidney test and she needs to be evaluated.  She is not sure of all the details of this   Past Medical History Past Medical History:  Diagnosis Date   Acute diverticulitis    Acute metabolic encephalopathy    Acute pain of right knee 10/14/2016   Acute renal failure (ARF) (HCC) 01/26/2019   Anemia    Arthritis    Chest pain    a. 12/2021 Cor CTA: LM nl, LAD <25%, LCX nl, RCA nl. Cor Ca2+ = 1.29.   Chronic back pain    Complicated by neuropathy. ->  On Neurontin and duloxetine along with Flexeril and Voltaren gel.  Also uses PRN tramadol.   Colon polyps    COPD with asthma    On combination of albuterol Qvar and Symbicort   Depression    Diabetes mellitus without complication (HCC)    type 2   Diabetic neuropathy (HCC)    Diastolic dysfunction    a. 09/2015 Echo: EF 55-60%, GrI DD; b. 12/2021 Echo: EF 60-65%, no rwma, GrI DD, nl RV fxn. RVSP 28.42mmHg. Mild MR.   Diverticulitis large intestine 05/12/2020   Dyspnea    GERD (gastroesophageal reflux disease)    Heart murmur    Hyperlipidemia    Hypertension    Lipoma of left upper extremity    Palpitations    a. 12/2021 Zio: Predominantly sinus rhythm at 80 (38-136).  Frequent PACs (5.3%).  5 short runs of atrial tachycardia (longest 33 beats at 119 bpm.  Fastest 160 bpm).  Triggered events associated PACs and short runs of PAT.    Thyroid cancer (HCC)    Urinary incontinence    Pessary present   Patient Active Problem List   Diagnosis Date Noted   Allergic rhinitis 09/03/2023   Orthostatic hypotension 03/27/2023   Syncope 03/26/2023   Orthopnea 03/26/2023   Type 2 diabetes mellitus with hyperglycemia, with long-term current use of insulin (HCC) 03/26/2023   S/P total thyroidectomy 11/07/2022   Vertigo 09/26/2022   Left-sided weakness 09/26/2022   DM (diabetes mellitus), type 2 with complications (HCC) 09/26/2022   Papillary thyroid carcinoma (HCC) 09/26/2022   Acute metabolic encephalopathy 09/25/2022   Lumbar strain 08/13/2022   Lipoma of left upper extremity    Exercise intolerance 12/26/2021   Heart murmur 12/26/2021   Shortness of breath 12/26/2021   Palpitations 12/26/2021   Abdominal bloating    Polyp of colon    Multiple adenomatous polyps    Depression    Hyperlipidemia associated with type 2 diabetes mellitus (HCC)    GERD (gastroesophageal reflux disease)    Influenza A 01/26/2019   Hypokalemia 06/12/2017   Marijuana use 06/12/2017   Diabetic neuropathy (HCC) 01/07/2016   Plantar fasciitis of left foot 11/26/2015   Back pain 10/10/2015   Asthma 09/29/2015   Essential hypertension 09/29/2015   DM (diabetes mellitus) type II  controlled, neurological manifestation (HCC) 09/29/2015   Home Medication(s) Prior to Admission medications   Medication Sig Start Date End Date Taking? Authorizing Provider  alum & mag hydroxide-simeth (MAALOX MAX) 400-400-40 MG/5ML suspension Take 15 mLs by mouth every 6 (six) hours as needed for indigestion. 09/10/23  Yes Lonell Grandchild, MD  albuterol (PROVENTIL) (2.5 MG/3ML) 0.083% nebulizer solution Take 3 mLs (2.5 mg total) by nebulization every 2 (two) hours as needed for wheezing or shortness of breath. 01/28/19   Lule, Arvil Persons, PA  albuterol (VENTOLIN HFA) 108 (90 Base) MCG/ACT inhaler Inhale 2 puffs into the lungs every 6 (six) hours as needed for wheezing  or shortness of breath. 01/01/22   Salena Saner, MD  amLODipine (NORVASC) 10 MG tablet Take 1 tablet (10 mg total) by mouth daily. 03/27/23 04/26/23  Willette Cluster, MD  atorvastatin (LIPITOR) 40 MG tablet Take 1 tablet (40 mg total) by mouth daily. 03/28/23 04/27/23  Willette Cluster, MD  budesonide-formoterol [facility]) 160-4.5 MCG/ACT inhaler Inhale 2 puffs into the lungs 2 (two) times daily. 01/01/22   Salena Saner, MD  Cholecalciferol (VITAMIN D3) 1.25 MG (50000 UT) CAPS Take 1 capsule by mouth once a week. wednesday 02/09/20   [provider]  Clobetasol Prop Emollient Base (CLOBETASOL PROPIONATE E) 0.05 % emollient cream Apply 1 Application topically at bedtime. 08/04/23   Selmer Dominion, NP  diclofenac sodium (VOLTAREN) 1 % GEL Apply 2 g topically 4 (four) times daily as needed (pain). 01/28/19   Lule, Arvil Persons, PA  fluticasone (FLONASE) 50 MCG/ACT nasal spray Place 2 sprays into both nostrils daily. 09/03/23   Leslye Peer, MD  gabapentin (NEURONTIN) 300 MG capsule Take 1 capsule (300 mg total) by mouth 2 (two) times daily for 15 days. Patient not taking: Reported on 03/26/2023 09/30/22 12/24/22  Glade Lloyd, MD  levothyroxine (SYNTHROID) 150 MCG tablet Take 1 tablet (150 mcg total) by mouth daily at 6 (six) AM. 03/28/23 04/27/23  Willette Cluster, MD  loratadine (CLARITIN) 10 MG tablet Take 1 tablet (10 mg total) by mouth daily. 01/28/19   Stormy Fabian, PA  losartan (COZAAR) 100 MG tablet Take 1 tablet (100 mg total) by mouth daily. 03/27/23 04/26/23  Willette Cluster, MD  meclizine (ANTIVERT) 25 MG tablet Take 1 tablet (25 mg total) by mouth 3 (three) times daily as needed for dizziness. 09/30/22   Glade Lloyd, MD  metFORMIN (GLUCOPHAGE-XR) 500 MG 24 hr tablet Take 2 tablets (1,000 mg total) by mouth 2 (two) times daily with a meal. 03/27/23 04/26/23  Willette Cluster, MD  methocarbamol (ROBAXIN) 500 MG tablet TAKE 1 TABLET BY MOUTH EVERY 6 HOURS AS NEEDED FOR MUSCLE SPASMS 09/23/22   Nadara Mustard, MD   NOVOLOG FLEXPEN 100 UNIT/ML FlexPen Inject into the skin 2 (two) times daily. 08/05/23   [provider]  omeprazole (PRILOSEC) 40 MG capsule Take 1 capsule (40 mg total) by mouth daily. 01/01/22   Salena Saner, MD  Semaglutide (RYBELSUS) 7 MG TABS Take 1 tablet (7 mg total) by mouth daily. 03/27/23   Willette Cluster, MD  sertraline (ZOLOFT) 50 MG tablet Take 1.5 tablets (75 mg total) by mouth every morning. Patient taking differently: Take 75 mg by mouth daily. 01/28/19   Stormy Fabian, PA  valACYclovir (VALTREX) 1000 MG tablet Take 1 tablet (1,000 mg total) by mouth 3 (three) times daily. 11/30/22   Darrick Grinder, PA-C  Past Surgical History Past Surgical History:  Procedure Laterality Date   7-Day Zio Patch Monitor  12/2021   Predominant sinus rhythm.  Rate range 38-136 bpm with an average of 80 bpm (bradycardia only noted during hours of sleep.  5 Atrial Runs-fastest was 5 beats at a max rate 162 bpm, longest was 17.2 seconds/33 beats at a rate of 119 bpm--not noted on patient trigger.  Frequent PACs noted (5.3%).  Rare PVCs.   ABDOMINAL HYSTERECTOMY     COLONOSCOPY WITH PROPOFOL N/A 10/02/2021   Procedure: COLONOSCOPY WITH PROPOFOL;  Surgeon: Toney Reil, MD;  Location: [facility] ENDOSCOPY;  Service: Gastroenterology;  Laterality: N/A;   Coronary CT angiogram  01/03/2022   Thoracic aortic atherosclerosis.  Coronary Calcium Score 1.29.  Minimal proximal LAD calcification. (<25%).   ESOPHAGOGASTRODUODENOSCOPY N/A 10/02/2021   Procedure: ESOPHAGOGASTRODUODENOSCOPY (EGD);  Surgeon: Toney Reil, MD;  Location: [facility] ENDOSCOPY;  Service: Gastroenterology;  Laterality: N/A;   LIPOMA EXCISION Left 07/08/2022   upper arm   THYROIDECTOMY Bilateral 11/07/2022   Procedure: TOTAL THYROIDECTOMY;  Surgeon: Vernie Murders, MD;  Location: [facility] ORS;  Service:  ENT;  Laterality: Bilateral;   TRANSTHORACIC ECHOCARDIOGRAM  09/29/2015   EF 55-60%.  No R WMA.  GR 1 DD.  Is also normal valves.  Normal study.   TRANSTHORACIC ECHOCARDIOGRAM  01/09/2022   EF 60 to 65%.  Mild to moderate LVH.  G1 DD.  Normal RV size and function.  Normal atrial sizes.  Normal valves.  NORMAL ECHO   TUBAL LIGATION     Family History Family History  Problem Relation Age of Onset   Hypertension Mother    Other Other     Social History Social History   Tobacco Use   Smoking status: Former    Current packs/day: 0.00    Average packs/day: 3.0 packs/day for 10.0 years (30.0 ttl pk-yrs)    Types: Cigarettes    Start date: 12/22/1994    Quit date: 12/22/2004    Years since quitting: 18.7   Smokeless tobacco: Never  Vaping Use   Vaping status: Never Used  Substance Use Topics   Alcohol use: No   Drug use: No   Allergies Patient has no known allergies.  Review of Systems Review of Systems  All other systems reviewed and are negative.   Physical Exam Vital Signs  I have reviewed the triage vital signs BP (!) 168/92   Pulse 61   Temp 98.5 F (36.9 C)   Resp 16   SpO2 100%  Physical Exam Vitals and nursing note reviewed.  Constitutional:      General: She is not in acute distress.    Appearance: She is well-developed.  HENT:     Head: Normocephalic and atraumatic.     Mouth/Throat:     Mouth: Mucous membranes are moist.  Eyes:     Pupils: Pupils are equal, round, and reactive to light.  Cardiovascular:     Rate and Rhythm: Normal rate and regular rhythm.     Heart sounds: No murmur heard. Pulmonary:     Effort: Pulmonary effort is normal. No respiratory distress.     Breath sounds: Normal breath sounds.  Abdominal:     General: Abdomen is flat.     Palpations: Abdomen is soft.     Tenderness: There is generalized abdominal tenderness and tenderness in the epigastric area and left upper quadrant.  Musculoskeletal:        General: No tenderness.  Right lower leg: No edema.     Left lower leg: No edema.  Skin:    General: Skin is warm and dry.  Neurological:     General: No focal deficit present.     Mental Status: She is alert. Mental status is at baseline.  Psychiatric:        Mood and Affect: Mood normal.        Behavior: Behavior normal.     ED Results and Treatments Labs (all labs ordered are listed, but only abnormal results are displayed) Labs Reviewed  COMPREHENSIVE METABOLIC PANEL - Abnormal; Notable for the following components:      Result Value   Sodium 133 (*)    Chloride 94 (*)    Glucose, Bld 114 (*)    Creatinine, Ser 1.19 (*)    AST 147 (*)    ALT 246 (*)    GFR, Estimated 54 (*)    All other components within normal limits  URINALYSIS, ROUTINE W REFLEX MICROSCOPIC - Abnormal; Notable for the following components:   Protein, ur 30 (*)    All other components within normal limits  LIPASE, BLOOD  CBC                                                                                                                          Radiology CT ABDOMEN PELVIS W CONTRAST  Result Date: 09/10/2023 CLINICAL DATA:  Abdominal pain, acute, nonlocalized pancreatitis and worsening kidney function. Patient states the abdominal pain has been going on "a long time". EXAM: CT ABDOMEN AND PELVIS WITH CONTRAST TECHNIQUE: Multidetector CT imaging of the abdomen and pelvis was performed using the standard protocol following bolus administration of intravenous contrast. RADIATION DOSE REDUCTION: This exam was performed according to the departmental dose-optimization program which includes automated exposure control, adjustment of the mA and/or kV according to patient size and/or use of iterative reconstruction technique. CONTRAST:  OMNIPAQUE IOHEXOL 300 MG/ML  SOLN COMPARISON:  CT abdomen pelvis 09/25/2022, CT abdomen 04/05/2013 FINDINGS: Lower chest: Bilateral lower lobe atelectasis. Otherwise no acute abnormality. Hepatobiliary:  No focal liver abnormality. No gallstones, gallbladder wall thickening, or pericholecystic fluid. No biliary dilatation. Pancreas: Stable chronic 8 mm pancreatic distal body/tail cystic lesion (minimally increased from 2014). No new lesion. Normal pancreatic contour. No surrounding inflammatory changes. No main pancreatic ductal dilatation. Spleen: Normal in size without focal abnormality. Adrenals/Urinary Tract: Stable heterogeneous left adrenal gland nodule measuring 3.2 x 2.5 cm with density of 82 Hounsfield units-previously determined to represent an adrenal adenoma, no further follow-up indicated. Bilateral kidneys enhance symmetrically. No hydronephrosis. No hydroureter. The urinary bladder is unremarkable. On delayed imaging, there is no urothelial wall thickening and there are no filling defects in the opacified portions of the bilateral collecting systems or ureters. Stomach/Bowel: Stomach is within normal limits. No evidence of bowel wall thickening or dilatation. Colonic diverticulosis. Appendix appears normal. Vascular/Lymphatic: No abdominal aorta or iliac aneurysm. No abdominal, pelvic, or inguinal lymphadenopathy. Reproductive:  Status post hysterectomy. No adnexal masses. Pessary device noted. Other: No intraperitoneal free fluid. No intraperitoneal free gas. No organized fluid collection. Musculoskeletal: No abdominal wall hernia or abnormality. No suspicious lytic or blastic osseous lesions. No acute displaced fracture. Old healed sacral fracture. IMPRESSION: 1. Colonic diverticulosis with no acute diverticulitis. 2. No CT findings of acute pancreatitis. 3. Stable chronic 8 mm pancreatic distal body/tail cystic lesion (minimally increased from 2014). Finding could represent a pseudocyst versus pancreatic malignancy. Recommend follow-up MRI pancreatic protocol in 2 years to evaluate stability. Electronically Signed   By: Tish Frederickson M.D.   On: 09/10/2023 19:37    Pertinent labs & imaging  results that were available during my care of the patient were reviewed by me and considered in my medical decision making (see MDM for details).  Medications Ordered in ED Medications  alum & mag hydroxide-simeth (MAALOX/MYLANTA) 200-200-20 MG/5ML suspension 30 mL (has no administration in time range)  HYDROmorphone (DILAUDID) injection 0.5 mg (0.5 mg Intravenous Given 09/10/23 1801)  iohexol (OMNIPAQUE) 300 MG/ML solution 100 mL (100 mLs Intravenous Contrast Given 09/10/23 1810)                                                                                                                                     Procedures Procedures  (including critical care time)  Medical Decision Making / ED Course   MDM:  57 year old female presenting to the emergency department with abdominal pain.  Patient overall well-appearing, does have some abdominal tenderness.  Unclear cause of her symptoms.  She reports that her primary care doctor told her she may have pancreatitis but her lipase here is normal.  She does have some nonspecific LFT abnormalities. Denies alcohol or excessive tylenol use.  Her renal function seems at baseline.  No sign of UTI.  Will obtain CT scan to further evaluate for acute intra-abdominal process such as perforation, obstruction, volvulus, cholecystitis, pancreatitis, or any other acute issue.  Clinical Course as of 09/10/23 2045  Thu Sep 10, 2023  2036 Labs otherwise without alternative process.  Doubt cholecystitis.  Patient afebrile, CT with no wall thickening, gallstones or other process.  Patient does not have any right upper quadrant tenderness.  Unclear cause of her liver abnormalities but recommended follow-up with your primary care doctor and gastroenterology, strict return precautions.  Discussed incidental pancreatic cyst which needs 2-year follow-up with the patient and she understands. Will discharge patient to home. All questions answered. Patient comfortable with  plan of discharge. Return precautions discussed with patient and specified on the after visit summary.  [WS]    Clinical Course User Index [WS] Lonell Grandchild, MD     Lab Tests: -I ordered, reviewed, and interpreted labs.   The pertinent results include:   Labs Reviewed  COMPREHENSIVE METABOLIC PANEL - Abnormal; Notable for the following components:      Result Value   Sodium 133 (*)  Chloride 94 (*)    Glucose, Bld 114 (*)    Creatinine, Ser 1.19 (*)    AST 147 (*)    ALT 246 (*)    GFR, Estimated 54 (*)    All other components within normal limits  URINALYSIS, ROUTINE W REFLEX MICROSCOPIC - Abnormal; Notable for the following components:   Protein, ur 30 (*)    All other components within normal limits  LIPASE, BLOOD  CBC    Notable for abnormal LFTs without hyperbilirubinemia   EKG   Imaging Studies ordered: I ordered imaging studies including CT abdomen On my interpretation imaging demonstrates no acute process. Pancreatic cyst I independently visualized and interpreted imaging. I agree with the radiologist interpretation   Medicines ordered and prescription drug management: Meds ordered this encounter  Medications   HYDROmorphone (DILAUDID) injection 0.5 mg   iohexol (OMNIPAQUE) 300 MG/ML solution 100 mL   alum & mag hydroxide-simeth (MAALOX MAX) 400-400-40 MG/5ML suspension    Sig: Take 15 mLs by mouth every 6 (six) hours as needed for indigestion.    Dispense:  355 mL    Refill:  0   alum & mag hydroxide-simeth (MAALOX/MYLANTA) 200-200-20 MG/5ML suspension 30 mL    -I have reviewed the patients home medicines and have made adjustments as needed    Reevaluation: After the interventions noted above, I reevaluated the patient and found that their symptoms have improved  Co morbidities that complicate the patient evaluation  Past Medical History:  Diagnosis Date   Acute diverticulitis    Acute metabolic encephalopathy    Acute pain of right  knee 10/14/2016   Acute renal failure (ARF) (HCC) 01/26/2019   Anemia    Arthritis    Chest pain    a. 12/2021 Cor CTA: LM nl, LAD <25%, LCX nl, RCA nl. Cor Ca2+ = 1.29.   Chronic back pain    Complicated by neuropathy. ->  On Neurontin and duloxetine along with Flexeril and Voltaren gel.  Also uses PRN tramadol.   Colon polyps    COPD with asthma    On combination of albuterol Qvar and Symbicort   Depression    Diabetes mellitus without complication (HCC)    type 2   Diabetic neuropathy (HCC)    Diastolic dysfunction    a. 09/2015 Echo: EF 55-60%, GrI DD; b. 12/2021 Echo: EF 60-65%, no rwma, GrI DD, nl RV fxn. RVSP 28.65mmHg. Mild MR.   Diverticulitis large intestine 05/12/2020   Dyspnea    GERD (gastroesophageal reflux disease)    Heart murmur    Hyperlipidemia    Hypertension    Lipoma of left upper extremity    Palpitations    a. 12/2021 Zio: Predominantly sinus rhythm at 80 (38-136).  Frequent PACs (5.3%).  5 short runs of atrial tachycardia (longest 33 beats at 119 bpm.  Fastest 160 bpm).  Triggered events associated PACs and short runs of PAT.   Thyroid cancer (HCC)    Urinary incontinence    Pessary present      Dispostion: Disposition decision including need for hospitalization was considered, and patient discharged from emergency department.    Final Clinical Impression(s) / ED Diagnoses Final diagnoses:  Abnormal liver function test     This chart was dictated using voice recognition software.  Despite best efforts to proofread,  errors can occur which can change the documentation meaning.    Lonell Grandchild, MD 09/10/23 2045

## 2023-09-10 NOTE — Discharge Instructions (Addendum)
We evaluated you for your abnormal labs.  Your laboratory test did not show any problems with your pancreas.  Your liver tests were slightly elevated but we did not see any dangerous cause of abnormal liver testing.  Your CT scan did show a cyst on your pancreas.  The radiologist recommends getting a repeat scan in 2 years.  Your symptoms seem more likely to be caused by inflammation in your stomach.  I prescribed you a medication which she can try to see if this helps.  Please follow-up closely with your primary doctor to get a repeat check on your laboratory testing to make sure that that is improving.  You develop any new or worsening symptoms such as severe abdominal pain, fevers or chills, vomiting, nausea, lightheadedness or dizziness, or any other new symptoms please return to the emergency department.

## 2023-09-10 NOTE — ED Provider Triage Note (Signed)
Emergency Medicine Provider Triage Evaluation Note  [name] , a 57 y.o. female  was evaluated in triage.  Pt complains of abdominal pain and feeling poorly.  Patient saw her PCP with [facility] who informed her she has pancreatitis and worsening kidney function.  Patient states the abdominal pain has been going on "a long time".  Denies nausea, vomiting, diarrhea.  Hx of diabetes, marijuana use.    Review of Systems  Positive: As above Negative: As above  Physical Exam  BP (!) 143/88 (BP Location: Left Arm)   Pulse 67   Temp 98.6 F (37 C) (Oral)   Resp 18   SpO2 96%  Gen:   Awake, no distress   Resp:  Normal effort  MSK:   Moves extremities without difficulty  Other:  Epigastric and LUQ tenderness  Medical Decision Making  Medically screening exam initiated at 1:26 PM.  Appropriate orders placed.  ADHYA DLUGOSZ was informed that the remainder of the evaluation will be completed by another provider, this initial triage assessment does not replace that evaluation, and the importance of remaining in the ED until their evaluation is complete.     Melton Alar R, PA-C 09/10/23 1328

## 2023-09-13 ENCOUNTER — Encounter (HOSPITAL_COMMUNITY): Payer: Self-pay

## 2023-09-13 ENCOUNTER — Emergency Department (HOSPITAL_COMMUNITY)
Admission: EM | Admit: 2023-09-13 | Discharge: 2023-09-13 | Disposition: A | Discharge status: Home / Self Care | Payer: Medicare HMO | Attending: Emergency Medicine | Admitting: Emergency Medicine

## 2023-09-13 ENCOUNTER — Other Ambulatory Visit: Payer: Self-pay

## 2023-09-13 DIAGNOSIS — R197 Diarrhea, unspecified: Secondary | ICD-10-CM | POA: Diagnosis not present

## 2023-09-13 DIAGNOSIS — Z79899 Other long term (current) drug therapy: Secondary | ICD-10-CM | POA: Insufficient documentation

## 2023-09-13 DIAGNOSIS — Z7984 Long term (current) use of oral hypoglycemic drugs: Secondary | ICD-10-CM | POA: Diagnosis not present

## 2023-09-13 DIAGNOSIS — R300 Dysuria: Secondary | ICD-10-CM | POA: Diagnosis not present

## 2023-09-13 DIAGNOSIS — R509 Fever, unspecified: Secondary | ICD-10-CM | POA: Insufficient documentation

## 2023-09-13 DIAGNOSIS — R1032 Left lower quadrant pain: Secondary | ICD-10-CM | POA: Diagnosis present

## 2023-09-13 DIAGNOSIS — E162 Hypoglycemia, unspecified: Secondary | ICD-10-CM | POA: Diagnosis not present

## 2023-09-13 DIAGNOSIS — Z7951 Long term (current) use of inhaled steroids: Secondary | ICD-10-CM | POA: Insufficient documentation

## 2023-09-13 LAB — CBC WITH DIFFERENTIAL/PLATELET
Abs Immature Granulocytes: 0.05 10*3/uL (ref 0.00–0.07)
Basophils Absolute: 0 10*3/uL (ref 0.0–0.1)
Basophils Relative: 0 %
Eosinophils Absolute: 0 10*3/uL (ref 0.0–0.5)
Eosinophils Relative: 0 %
HCT: 38.7 % (ref 36.0–46.0)
Hemoglobin: 13.1 g/dL (ref 12.0–15.0)
Immature Granulocytes: 1 %
Lymphocytes Relative: 22 %
Lymphs Abs: 1.5 10*3/uL (ref 0.7–4.0)
MCH: 31.7 pg (ref 26.0–34.0)
MCHC: 33.9 g/dL (ref 30.0–36.0)
MCV: 93.7 fL (ref 80.0–100.0)
Monocytes Absolute: 0.5 10*3/uL (ref 0.1–1.0)
Monocytes Relative: 8 %
Neutro Abs: 4.8 10*3/uL (ref 1.7–7.7)
Neutrophils Relative %: 69 %
Platelets: 239 10*3/uL (ref 150–400)
RBC: 4.13 MIL/uL (ref 3.87–5.11)
RDW: 14.8 % (ref 11.5–15.5)
WBC: 6.8 10*3/uL (ref 4.0–10.5)
nRBC: 0 % (ref 0.0–0.2)

## 2023-09-13 LAB — URINALYSIS, W/ REFLEX TO CULTURE (INFECTION SUSPECTED)
Bacteria, UA: NONE SEEN
Bilirubin Urine: NEGATIVE
Glucose, UA: NEGATIVE mg/dL
Hgb urine dipstick: NEGATIVE
Ketones, ur: NEGATIVE mg/dL
Leukocytes,Ua: NEGATIVE
Nitrite: NEGATIVE
Protein, ur: NEGATIVE mg/dL
Specific Gravity, Urine: 1.004 — ABNORMAL LOW (ref 1.005–1.030)
pH: 8 (ref 5.0–8.0)

## 2023-09-13 LAB — COMPREHENSIVE METABOLIC PANEL
ALT: 188 U/L — ABNORMAL HIGH (ref 0–44)
AST: 154 U/L — ABNORMAL HIGH (ref 15–41)
Albumin: 3.8 g/dL (ref 3.5–5.0)
Alkaline Phosphatase: 98 U/L (ref 38–126)
Anion gap: 12 (ref 5–15)
BUN: 19 mg/dL (ref 6–20)
CO2: 29 mmol/L (ref 22–32)
Calcium: 9.6 mg/dL (ref 8.9–10.3)
Chloride: 96 mmol/L — ABNORMAL LOW (ref 98–111)
Creatinine, Ser: 0.8 mg/dL (ref 0.44–1.00)
GFR, Estimated: >60 mL/min (ref >60)
Glucose, Bld: 41 mg/dL — CL (ref 70–99)
Potassium: 3.4 mmol/L — ABNORMAL LOW (ref 3.5–5.1)
Sodium: 137 mmol/L (ref 135–145)
Total Bilirubin: 0.8 mg/dL (ref 0.3–1.2)
Total Protein: 7.2 g/dL (ref 6.5–8.1)

## 2023-09-13 LAB — CBG MONITORING, ED
Glucose-Capillary: 95 mg/dL (ref 70–99)
Glucose-Capillary: 161 mg/dL — ABNORMAL HIGH (ref 70–99)

## 2023-09-13 LAB — LIPASE, BLOOD: Lipase: 24 U/L (ref 11–51)

## 2023-09-13 MED ORDER — SODIUM CHLORIDE 0.9% FLUSH: 3.0000 mL | Freq: Two times a day (BID) | INTRAVENOUS | Status: DC

## 2023-09-13 MED ORDER — DICYCLOMINE HCL 10 MG PO CAPS
20.0000 mg | ORAL_CAPSULE | Freq: Once | ORAL | Status: AC
  Administered 2023-09-13: 20 mg via ORAL
  Filled 2023-09-13: qty 2

## 2023-09-13 MED ORDER — SODIUM CHLORIDE 0.9 % IV SOLN: 250.0000 mL | INTRAVENOUS | Status: DC | PRN

## 2023-09-13 MED ORDER — SODIUM CHLORIDE 0.9% FLUSH: 3.0000 mL | INTRAVENOUS | Status: DC | PRN

## 2023-09-13 MED ORDER — BISMUTH SUBSALICYLATE 262 MG/15ML PO SUSP: 30.0000 mL | Freq: Four times a day (QID) | ORAL | 0 refills | Status: AC | PRN

## 2023-09-13 MED ORDER — POTASSIUM CHLORIDE CRYS ER 20 MEQ PO TBCR
40.0000 meq | EXTENDED_RELEASE_TABLET | Freq: Once | ORAL | Status: AC
  Administered 2023-09-13: 40 meq via ORAL
  Filled 2023-09-13: qty 2

## 2023-09-13 MED ORDER — LOPERAMIDE HCL 2 MG PO CAPS: 2.0000 mg | ORAL_CAPSULE | Freq: Four times a day (QID) | ORAL | 0 refills | Status: AC | PRN

## 2023-09-13 MED ORDER — LACTATED RINGERS IV BOLUS
1000.0000 mL | Freq: Once | INTRAVENOUS | Status: AC
  Administered 2023-09-13: 1000 mL via INTRAVENOUS

## 2023-09-13 MED ORDER — DICYCLOMINE HCL 20 MG PO TABS: 20.0000 mg | ORAL_TABLET | Freq: Three times a day (TID) | ORAL | 0 refills | Status: AC

## 2023-09-13 MED ORDER — KETOROLAC TROMETHAMINE 15 MG/ML IJ SOLN
15.0000 mg | Freq: Once | INTRAMUSCULAR | Status: AC
  Administered 2023-09-13: 15 mg via INTRAVENOUS
  Filled 2023-09-13: qty 1

## 2023-09-13 NOTE — ED Triage Notes (Signed)
Pt reports abd pain and dizziness x few days. Was seen for same a few days ago.

## 2023-09-13 NOTE — ED Notes (Signed)
Pt was given dinner tray before d/c

## 2023-09-13 NOTE — Discharge Instructions (Signed)
Be sure to eat and drink appropriately to help prevent low blood sugars. We are prescribing you medicine to help with your abdominal pain and to treat the diarrhea.  If you develop new or worsening abdominal pain, fever, blood in the diarrhea, or any other new/concerning symptoms, then return to the ER or call 911.

## 2023-09-13 NOTE — ED Provider Notes (Signed)
[place] EMERGENCY DEPARTMENT AT [facility] Provider Note   CSN: [id number] Arrival date & time: 09/13/23  1102     History  Chief Complaint  Patient presents with   Abdominal Pain    [name] is a 57 y.o. female.  HPI 57 year old female presents with continued lower abdominal pain and diarrhea.  Originally the symptoms started 4 days ago.  She was seen here 2 days ago and had a negative workup including a CT scan of the lung.  She states the pain is constant but does worsen when she is going to the bathroom.  She has been having nonbloody diarrhea as well as dysuria.  She states she is urinating and having diarrhea at the same time which is concerning to her.  She think she might of had a low-grade fever at 1 point.  She was given some pain medicine and something for diarrhea while she was here but has not taken any other new medicines since discharge and her pain has come back.  It is not particularly worse but has not improved.  No recent antibiotic use.  No back pain or vomiting.  Home Medications Prior to Admission medications   Medication Sig Start Date End Date Taking? Authorizing Provider  bismuth subsalicylate (PEPTO BISMOL) 262 MG/15ML suspension Take 30 mLs by mouth every 6 (six) hours as needed. 09/13/23  Yes Pricilla Loveless, MD  dicyclomine (BENTYL) 20 MG tablet Take 1 tablet (20 mg total) by mouth 3 (three) times daily before meals. 09/13/23  Yes Pricilla Loveless, MD  loperamide (IMODIUM) 2 MG capsule Take 1 capsule (2 mg total) by mouth 4 (four) times daily as needed for diarrhea or loose stools. 09/13/23  Yes Pricilla Loveless, MD  albuterol (PROVENTIL) (2.5 MG/3ML) 0.083% nebulizer solution Take 3 mLs (2.5 mg total) by nebulization every 2 (two) hours as needed for wheezing or shortness of breath. 01/28/19   Lule, Arvil Persons, PA  albuterol (VENTOLIN HFA) 108 (90 Base) MCG/ACT inhaler Inhale 2 puffs into the lungs every 6 (six) hours as needed for wheezing or  shortness of breath. 01/01/22   Salena Saner, MD  alum & mag hydroxide-simeth (MAALOX MAX) 400-400-40 MG/5ML suspension Take 15 mLs by mouth every 6 (six) hours as needed for indigestion. 09/10/23   Lonell Grandchild, MD  amLODipine (NORVASC) 10 MG tablet Take 1 tablet (10 mg total) by mouth daily. 03/27/23 04/26/23  Willette Cluster, MD  atorvastatin (LIPITOR) 40 MG tablet Take 1 tablet (40 mg total) by mouth daily. 03/28/23 04/27/23  Willette Cluster, MD  budesonide-formoterol [facility]) 160-4.5 MCG/ACT inhaler Inhale 2 puffs into the lungs 2 (two) times daily. 01/01/22   Salena Saner, MD  Cholecalciferol (VITAMIN D3) 1.25 MG (50000 UT) CAPS Take 1 capsule by mouth once a week. wednesday 02/09/20   [provider]  Clobetasol Prop Emollient Base (CLOBETASOL PROPIONATE E) 0.05 % emollient cream Apply 1 Application topically at bedtime. 08/04/23   Selmer Dominion, NP  diclofenac sodium (VOLTAREN) 1 % GEL Apply 2 g topically 4 (four) times daily as needed (pain). 01/28/19   Lule, Arvil Persons, PA  fluticasone (FLONASE) 50 MCG/ACT nasal spray Place 2 sprays into both nostrils daily. 09/03/23   Leslye Peer, MD  gabapentin (NEURONTIN) 300 MG capsule Take 1 capsule (300 mg total) by mouth 2 (two) times daily for 15 days. Patient not taking: Reported on 03/26/2023 09/30/22 12/24/22  Glade Lloyd, MD  levothyroxine (SYNTHROID) 150 MCG tablet Take 1 tablet (150  mcg total) by mouth daily at 6 (six) AM. 03/28/23 04/27/23  Willette Cluster, MD  loratadine (CLARITIN) 10 MG tablet Take 1 tablet (10 mg total) by mouth daily. 01/28/19   Stormy Fabian, PA  losartan (COZAAR) 100 MG tablet Take 1 tablet (100 mg total) by mouth daily. 03/27/23 04/26/23  Willette Cluster, MD  meclizine (ANTIVERT) 25 MG tablet Take 1 tablet (25 mg total) by mouth 3 (three) times daily as needed for dizziness. 09/30/22   Glade Lloyd, MD  metFORMIN (GLUCOPHAGE-XR) 500 MG 24 hr tablet Take 2 tablets (1,000 mg total) by mouth 2 (two) times daily with a meal.  03/27/23 04/26/23  Willette Cluster, MD  methocarbamol (ROBAXIN) 500 MG tablet TAKE 1 TABLET BY MOUTH EVERY 6 HOURS AS NEEDED FOR MUSCLE SPASMS 09/23/22   Nadara Mustard, MD  NOVOLOG FLEXPEN 100 UNIT/ML FlexPen Inject into the skin 2 (two) times daily. 08/05/23   [provider]  omeprazole (PRILOSEC) 40 MG capsule Take 1 capsule (40 mg total) by mouth daily. 01/01/22   Salena Saner, MD  Semaglutide (RYBELSUS) 7 MG TABS Take 1 tablet (7 mg total) by mouth daily. 03/27/23   Willette Cluster, MD  sertraline (ZOLOFT) 50 MG tablet Take 1.5 tablets (75 mg total) by mouth every morning. Patient taking differently: Take 75 mg by mouth daily. 01/28/19   Stormy Fabian, PA  valACYclovir (VALTREX) 1000 MG tablet Take 1 tablet (1,000 mg total) by mouth 3 (three) times daily. 11/30/22   Darrick Grinder, PA-C      Allergies    Patient has no known allergies.    Review of Systems   Review of Systems  Constitutional:  Negative for fever.  Gastrointestinal:  Positive for abdominal pain and diarrhea. Negative for blood in stool and vomiting.  Genitourinary:  Positive for dysuria.    Physical Exam Updated Vital Signs BP (!) 144/102   Pulse 74   Temp 98 F (36.7 C) (Oral)   Resp 16   Ht 5\' 1"  (1.549 m)   Wt 65.4 kg   SpO2 100%   BMI 27.24 kg/m  Physical Exam Vitals and nursing note reviewed.  Constitutional:      General: She is not in acute distress.    Appearance: She is well-developed. She is not ill-appearing or diaphoretic.  HENT:     Head: Normocephalic and atraumatic.  Cardiovascular:     Rate and Rhythm: Normal rate and regular rhythm.     Heart sounds: Normal heart sounds.  Pulmonary:     Effort: Pulmonary effort is normal.     Breath sounds: Normal breath sounds.  Abdominal:     Palpations: Abdomen is soft.     Tenderness: There is abdominal tenderness (mild) in the left lower quadrant.  Skin:    General: Skin is warm and dry.  Neurological:     Mental Status: She is alert.      ED Results / Procedures / Treatments   Labs (all labs ordered are listed, but only abnormal results are displayed) Labs Reviewed  COMPREHENSIVE METABOLIC PANEL - Abnormal; Notable for the following components:      Result Value   Potassium 3.4 (*)    Chloride 96 (*)    Glucose, Bld 41 (*)    AST 154 (*)    ALT 188 (*)    All other components within normal limits  URINALYSIS, W/ REFLEX TO CULTURE (INFECTION SUSPECTED) - Abnormal; Notable for the following components:   Color, Urine STRAW (*)  Specific Gravity, Urine 1.004 (*)    All other components within normal limits  CBG MONITORING, ED - Abnormal; Notable for the following components:   Glucose-Capillary 161 (*)    All other components within normal limits  C DIFFICILE QUICK SCREEN W PCR REFLEX    GASTROINTESTINAL PANEL BY PCR, STOOL (REPLACES STOOL CULTURE)  LIPASE, BLOOD  CBC WITH DIFFERENTIAL/PLATELET  CBG MONITORING, ED  CBG MONITORING, ED    EKG None  Radiology No results found.  Procedures Procedures    Medications Ordered in ED Medications  sodium chloride flush (NS) 0.9 % injection 3 mL (3 mLs Intravenous Not Given 09/13/23 1512)  sodium chloride flush (NS) 0.9 % injection 3 mL (has no administration in time range)  0.9 %  sodium chloride infusion (has no administration in time range)  lactated ringers bolus 1,000 mL (0 mLs Intravenous Stopped 09/13/23 1632)  ketorolac (TORADOL) 15 MG/ML injection 15 mg (15 mg Intravenous Given 09/13/23 1341)  dicyclomine (BENTYL) capsule 20 mg (20 mg Oral Given 09/13/23 1341)  potassium chloride SA (KLOR-CON M) CR tablet 40 mEq (40 mEq Oral Given 09/13/23 1729)    ED Course/ Medical Decision Making/ A&P                                 Medical Decision Making Amount and/or Complexity of Data Reviewed Labs: ordered.    Details: Hypoglycemia down to 41.  Chronic LFT elevations, similar to a few days ago.  Mild hypokalemia.  Normal WBC.  Risk OTC  drugs. Prescription drug management.   Patient presents with continued lower abdominal pain.  CT images from a couple days ago showed no acute pathology.  Given essentially unchanged WBC and only mild tenderness on my exam, I think diverticulitis or serious intra-abdominal infection/emergency is pretty unlikely.  I do not think a rescan is needed.  She has not had any diarrhea here to produce a sample so I think C. difficile is less likely.  She is feeling better after treatments in the ED which included Bentyl and Toradol.  She was given some fluids.  She was found to have hypoglycemia on her CMP and while she did not have any mental status changes she was given oral juice, food, and her glucose has come up and stayed up.  I suspect this is from poor p.o. intake in the diarrhea.  Potassium was also replaced.  Overall, I suspect the diarrhea is causing all of her symptoms and is reasonable to treat with Imodium and Pepto-Bismol and have her follow-up with PCP.  Will give a prescription for Bentyl as well.  Discharged home with return precautions.        Final Clinical Impression(s) / ED Diagnoses Final diagnoses:  Diarrhea, unspecified type  Hypoglycemia    Rx / DC Orders ED Discharge Orders          Ordered    dicyclomine (BENTYL) 20 MG tablet  3 times daily before meals        09/13/23 1722    loperamide (IMODIUM) 2 MG capsule  4 times daily PRN        09/13/23 1722    bismuth subsalicylate (PEPTO BISMOL) 262 MG/15ML suspension  Every 6 hours PRN        09/13/23 1722              Pricilla Loveless, MD 09/13/23 1916

## 2023-09-17 ENCOUNTER — Other Ambulatory Visit: Payer: Medicare HMO

## 2023-11-05 ENCOUNTER — Ambulatory Visit: Payer: Medicare HMO | Admitting: Obstetrics and Gynecology

## 2023-12-02 ENCOUNTER — Ambulatory Visit: Payer: Medicare HMO | Admitting: Emergency Medicine

## 2023-12-03 ENCOUNTER — Encounter: Payer: Self-pay | Admitting: Pulmonary Disease

## 2024-08-01 NOTE — Telephone Encounter (Signed)
 No note documented by sender. Closing encounter

## 2025-01-02 ENCOUNTER — Encounter: Payer: Self-pay | Admitting: *Deleted
